# Patient Record
Sex: Male | Born: 1950 | Race: White | Hispanic: No | Marital: Married | Smoking: Never smoker
Health system: Southern US, Community
[De-identification: ages and names within clinical notes are randomized; demographics above are authoritative.]

## PROBLEM LIST (undated history)

## (undated) DIAGNOSIS — M545 Low back pain, unspecified: Secondary | ICD-10-CM

## (undated) DIAGNOSIS — I4892 Unspecified atrial flutter: Secondary | ICD-10-CM

## (undated) DIAGNOSIS — F1010 Alcohol abuse, uncomplicated: Secondary | ICD-10-CM

## (undated) DIAGNOSIS — I2510 Atherosclerotic heart disease of native coronary artery without angina pectoris: Secondary | ICD-10-CM

## (undated) DIAGNOSIS — R739 Hyperglycemia, unspecified: Secondary | ICD-10-CM

## (undated) DIAGNOSIS — I10 Essential (primary) hypertension: Secondary | ICD-10-CM

## (undated) DIAGNOSIS — M5430 Sciatica, unspecified side: Secondary | ICD-10-CM

## (undated) DIAGNOSIS — E785 Hyperlipidemia, unspecified: Secondary | ICD-10-CM

## (undated) DIAGNOSIS — G629 Polyneuropathy, unspecified: Secondary | ICD-10-CM

## (undated) DIAGNOSIS — I6529 Occlusion and stenosis of unspecified carotid artery: Secondary | ICD-10-CM

## (undated) DIAGNOSIS — IMO0002 Reserved for concepts with insufficient information to code with codable children: Secondary | ICD-10-CM

## (undated) DIAGNOSIS — Z951 Presence of aortocoronary bypass graft: Secondary | ICD-10-CM

## (undated) DIAGNOSIS — Z86010 Personal history of colonic polyps: Secondary | ICD-10-CM

## (undated) DIAGNOSIS — M109 Gout, unspecified: Secondary | ICD-10-CM

## (undated) DIAGNOSIS — I480 Paroxysmal atrial fibrillation: Secondary | ICD-10-CM

## (undated) HISTORY — DX: Low back pain: M54.5

## (undated) HISTORY — DX: Personal history of colonic polyps: Z86.010

## (undated) HISTORY — DX: Low back pain, unspecified: M54.50

## (undated) HISTORY — DX: Gout, unspecified: M10.9

## (undated) HISTORY — DX: Hyperlipidemia, unspecified: E78.5

## (undated) HISTORY — DX: Hyperglycemia, unspecified: R73.9

## (undated) HISTORY — PX: WRIST SURGERY: SHX841

## (undated) HISTORY — DX: Sciatica, unspecified side: M54.30

## (undated) HISTORY — DX: Reserved for concepts with insufficient information to code with codable children: IMO0002

## (undated) HISTORY — DX: Atherosclerotic heart disease of native coronary artery without angina pectoris: I25.10

## (undated) HISTORY — DX: Paroxysmal atrial fibrillation: I48.0

## (undated) HISTORY — PX: LAPAROSCOPIC INGUINAL HERNIA REPAIR: SUR788

## (undated) HISTORY — PX: INGUINAL HERNIA REPAIR: SUR1180

## (undated) HISTORY — DX: Polyneuropathy, unspecified: G62.9

## (undated) HISTORY — DX: Alcohol abuse, uncomplicated: F10.10

## (undated) HISTORY — PX: TONSILLECTOMY AND ADENOIDECTOMY: SUR1326

## (undated) HISTORY — DX: Occlusion and stenosis of unspecified carotid artery: I65.29

## (undated) HISTORY — DX: Unspecified atrial flutter: I48.92

---

## 1998-01-09 ENCOUNTER — Emergency Department (HOSPITAL_COMMUNITY): Admission: EM | Admit: 1998-01-09 | Discharge: 1998-01-09 | Payer: Self-pay | Admitting: Emergency Medicine

## 2000-06-30 ENCOUNTER — Ambulatory Visit (HOSPITAL_COMMUNITY): Admission: RE | Admit: 2000-06-30 | Discharge: 2000-06-30 | Payer: Self-pay | Admitting: General Surgery

## 2002-10-01 HISTORY — PX: COLONOSCOPY: SHX174

## 2004-03-09 ENCOUNTER — Ambulatory Visit (HOSPITAL_COMMUNITY): Admission: RE | Admit: 2004-03-09 | Discharge: 2004-03-09 | Payer: Self-pay | Admitting: Internal Medicine

## 2004-05-04 ENCOUNTER — Ambulatory Visit: Payer: Self-pay | Admitting: Cardiology

## 2004-05-22 ENCOUNTER — Ambulatory Visit: Payer: Self-pay | Admitting: Cardiology

## 2004-05-24 ENCOUNTER — Ambulatory Visit: Payer: Self-pay

## 2004-07-31 ENCOUNTER — Ambulatory Visit: Payer: Self-pay | Admitting: Internal Medicine

## 2004-08-30 ENCOUNTER — Ambulatory Visit: Payer: Self-pay | Admitting: Internal Medicine

## 2005-02-01 ENCOUNTER — Ambulatory Visit: Payer: Self-pay | Admitting: Internal Medicine

## 2005-03-13 ENCOUNTER — Ambulatory Visit: Payer: Self-pay | Admitting: Internal Medicine

## 2005-09-06 ENCOUNTER — Emergency Department (HOSPITAL_COMMUNITY): Admission: EM | Admit: 2005-09-06 | Discharge: 2005-09-06 | Payer: Self-pay | Admitting: Emergency Medicine

## 2005-09-20 ENCOUNTER — Ambulatory Visit: Payer: Self-pay | Admitting: Internal Medicine

## 2006-04-02 ENCOUNTER — Ambulatory Visit: Payer: Self-pay | Admitting: Internal Medicine

## 2006-06-16 ENCOUNTER — Ambulatory Visit: Payer: Self-pay | Admitting: Internal Medicine

## 2006-06-16 LAB — CONVERTED CEMR LAB
ALT: 77 units/L — ABNORMAL HIGH (ref 0–40)
AST: 85 units/L — ABNORMAL HIGH (ref 0–37)
Albumin: 4 g/dL (ref 3.5–5.2)
Alkaline Phosphatase: 115 units/L (ref 39–117)
BUN: 14 mg/dL (ref 6–23)
Bilirubin, Direct: 0.2 mg/dL (ref 0.0–0.3)
CO2: 33 meq/L — ABNORMAL HIGH (ref 19–32)
Calcium: 9.5 mg/dL (ref 8.4–10.5)
Chloride: 95 meq/L — ABNORMAL LOW (ref 96–112)
Chol/HDL Ratio, serum: 6
Cholesterol: 315 mg/dL (ref 0–200)
Creatinine, Ser: 1.1 mg/dL (ref 0.4–1.5)
GFR calc non Af Amer: 74 mL/min
Glomerular Filtration Rate, Af Am: 90 mL/min/{1.73_m2}
Glucose, Bld: 144 mg/dL — ABNORMAL HIGH (ref 70–99)
HDL: 52.2 mg/dL (ref >39.0)
LDL DIRECT: 191.4 mg/dL
Potassium: 4.9 meq/L (ref 3.5–5.1)
Sodium: 135 meq/L (ref 135–145)
Total Bilirubin: 0.8 mg/dL (ref 0.3–1.2)
Total Protein: 6.7 g/dL (ref 6.0–8.3)
Triglyceride fasting, serum: 437 mg/dL (ref 0–149)
VLDL: 87 mg/dL — ABNORMAL HIGH (ref 0–40)

## 2006-07-09 ENCOUNTER — Ambulatory Visit: Payer: Self-pay | Admitting: Internal Medicine

## 2006-09-28 ENCOUNTER — Emergency Department (HOSPITAL_COMMUNITY): Admission: EM | Admit: 2006-09-28 | Discharge: 2006-09-28 | Payer: Self-pay | Admitting: Emergency Medicine

## 2006-10-03 ENCOUNTER — Ambulatory Visit: Payer: Self-pay | Admitting: Internal Medicine

## 2006-12-23 ENCOUNTER — Telehealth: Payer: Self-pay | Admitting: Internal Medicine

## 2006-12-25 ENCOUNTER — Telehealth: Payer: Self-pay | Admitting: Internal Medicine

## 2006-12-25 ENCOUNTER — Telehealth (INDEPENDENT_AMBULATORY_CARE_PROVIDER_SITE_OTHER): Payer: Self-pay | Admitting: *Deleted

## 2006-12-25 ENCOUNTER — Encounter: Payer: Self-pay | Admitting: Internal Medicine

## 2007-01-20 ENCOUNTER — Encounter: Payer: Self-pay | Admitting: Internal Medicine

## 2007-01-21 ENCOUNTER — Telehealth (INDEPENDENT_AMBULATORY_CARE_PROVIDER_SITE_OTHER): Payer: Self-pay | Admitting: *Deleted

## 2007-03-04 ENCOUNTER — Telehealth: Payer: Self-pay | Admitting: Internal Medicine

## 2007-03-11 DIAGNOSIS — M545 Low back pain: Secondary | ICD-10-CM | POA: Insufficient documentation

## 2007-03-11 DIAGNOSIS — E785 Hyperlipidemia, unspecified: Secondary | ICD-10-CM

## 2007-03-11 DIAGNOSIS — I10 Essential (primary) hypertension: Secondary | ICD-10-CM | POA: Insufficient documentation

## 2007-05-21 ENCOUNTER — Ambulatory Visit: Payer: Self-pay | Admitting: Internal Medicine

## 2007-05-22 LAB — CONVERTED CEMR LAB
ALT: 75 units/L — ABNORMAL HIGH (ref 0–53)
AST: 111 units/L — ABNORMAL HIGH (ref 0–37)
Albumin: 4.5 g/dL (ref 3.5–5.2)
Alkaline Phosphatase: 147 units/L — ABNORMAL HIGH (ref 39–117)
BUN: 8 mg/dL (ref 6–23)
Bilirubin, Direct: 0.2 mg/dL (ref 0.0–0.3)
CO2: 30 meq/L (ref 19–32)
Calcium: 9.5 mg/dL (ref 8.4–10.5)
Chloride: 91 meq/L — ABNORMAL LOW (ref 96–112)
Cholesterol: 340 mg/dL (ref 0–200)
Creatinine, Ser: 0.9 mg/dL (ref 0.4–1.5)
Direct LDL: 130.2 mg/dL
GFR calc Af Amer: 113 mL/min
GFR calc non Af Amer: 93 mL/min
Glucose, Bld: 96 mg/dL (ref 70–99)
HDL: 74.1 mg/dL (ref >39.0)
Potassium: 3.9 meq/L (ref 3.5–5.1)
Sodium: 132 meq/L — ABNORMAL LOW (ref 135–145)
Total Bilirubin: 0.8 mg/dL (ref 0.3–1.2)
Total CHOL/HDL Ratio: 4.6
Total Protein: 7.4 g/dL (ref 6.0–8.3)
Triglycerides: 885 mg/dL (ref 0–149)
VLDL: 177 mg/dL — ABNORMAL HIGH (ref 0–40)

## 2007-06-06 ENCOUNTER — Ambulatory Visit: Payer: Self-pay | Admitting: Family Medicine

## 2007-06-16 ENCOUNTER — Telehealth: Payer: Self-pay | Admitting: Internal Medicine

## 2007-06-17 ENCOUNTER — Telehealth: Payer: Self-pay | Admitting: Family Medicine

## 2007-08-27 ENCOUNTER — Ambulatory Visit: Payer: Self-pay | Admitting: Cardiology

## 2007-09-10 ENCOUNTER — Encounter: Payer: Self-pay | Admitting: Internal Medicine

## 2007-09-10 ENCOUNTER — Ambulatory Visit: Payer: Self-pay

## 2007-09-17 ENCOUNTER — Ambulatory Visit: Payer: Self-pay | Admitting: Cardiology

## 2007-09-17 LAB — CONVERTED CEMR LAB
BUN: 9 mg/dL (ref 6–23)
Basophils Relative: 0.1 % (ref 0.0–1.0)
CO2: 31 meq/L (ref 19–32)
Calcium: 9 mg/dL (ref 8.4–10.5)
Chloride: 94 meq/L — ABNORMAL LOW (ref 96–112)
Creatinine, Ser: 0.8 mg/dL (ref 0.4–1.5)
GFR calc non Af Amer: 107 mL/min
HCT: 37 % — ABNORMAL LOW (ref 39.0–52.0)
INR: 1 (ref 0.8–1.0)
Monocytes Absolute: 0.4 10*3/uL (ref 0.1–1.0)
Monocytes Relative: 11.9 % (ref 3.0–12.0)
Potassium: 4.9 meq/L (ref 3.5–5.1)
Prothrombin Time: 11.9 s (ref 10.9–13.3)
Sodium: 134 meq/L — ABNORMAL LOW (ref 135–145)
WBC: 3 10*3/uL — ABNORMAL LOW (ref 4.5–10.5)

## 2007-09-18 ENCOUNTER — Ambulatory Visit: Payer: Self-pay | Admitting: Cardiology

## 2007-09-18 ENCOUNTER — Inpatient Hospital Stay (HOSPITAL_BASED_OUTPATIENT_CLINIC_OR_DEPARTMENT_OTHER): Admission: RE | Admit: 2007-09-18 | Discharge: 2007-09-18 | Payer: Self-pay | Admitting: Cardiology

## 2007-09-18 HISTORY — PX: CARDIAC CATHETERIZATION: SHX172

## 2007-09-21 ENCOUNTER — Ambulatory Visit: Payer: Self-pay | Admitting: Thoracic Surgery (Cardiothoracic Vascular Surgery)

## 2007-09-21 ENCOUNTER — Encounter: Payer: Self-pay | Admitting: Internal Medicine

## 2007-09-23 ENCOUNTER — Telehealth: Payer: Self-pay | Admitting: Internal Medicine

## 2007-09-29 ENCOUNTER — Encounter: Payer: Self-pay | Admitting: Thoracic Surgery (Cardiothoracic Vascular Surgery)

## 2007-09-29 ENCOUNTER — Ambulatory Visit: Payer: Self-pay | Admitting: Vascular Surgery

## 2007-09-29 ENCOUNTER — Ambulatory Visit
Admission: RE | Admit: 2007-09-29 | Discharge: 2007-09-29 | Payer: Self-pay | Admitting: Thoracic Surgery (Cardiothoracic Vascular Surgery)

## 2007-10-02 ENCOUNTER — Encounter: Payer: Self-pay | Admitting: Internal Medicine

## 2007-10-05 ENCOUNTER — Ambulatory Visit: Payer: Self-pay | Admitting: Thoracic Surgery (Cardiothoracic Vascular Surgery)

## 2007-10-09 ENCOUNTER — Ambulatory Visit: Payer: Self-pay | Admitting: Internal Medicine

## 2007-10-09 ENCOUNTER — Inpatient Hospital Stay (HOSPITAL_COMMUNITY)
Admission: RE | Admit: 2007-10-09 | Discharge: 2007-10-28 | Payer: Self-pay | Admitting: Thoracic Surgery (Cardiothoracic Vascular Surgery)

## 2007-10-09 ENCOUNTER — Ambulatory Visit: Payer: Self-pay | Admitting: Thoracic Surgery (Cardiothoracic Vascular Surgery)

## 2007-10-09 HISTORY — PX: CORONARY ARTERY BYPASS GRAFT: SHX141

## 2007-10-23 ENCOUNTER — Ambulatory Visit: Payer: Self-pay | Admitting: Surgery

## 2007-10-23 ENCOUNTER — Encounter: Payer: Self-pay | Admitting: Thoracic Surgery (Cardiothoracic Vascular Surgery)

## 2007-10-27 ENCOUNTER — Ambulatory Visit: Payer: Self-pay | Admitting: Psychiatry

## 2007-11-16 ENCOUNTER — Encounter
Admission: RE | Admit: 2007-11-16 | Discharge: 2007-11-16 | Payer: Self-pay | Admitting: Thoracic Surgery (Cardiothoracic Vascular Surgery)

## 2007-11-16 ENCOUNTER — Encounter: Payer: Self-pay | Admitting: Internal Medicine

## 2007-11-16 ENCOUNTER — Ambulatory Visit: Payer: Self-pay | Admitting: Thoracic Surgery (Cardiothoracic Vascular Surgery)

## 2008-01-27 ENCOUNTER — Ambulatory Visit: Payer: Self-pay | Admitting: Cardiology

## 2008-06-07 ENCOUNTER — Ambulatory Visit: Payer: Self-pay | Admitting: Family Medicine

## 2008-06-07 DIAGNOSIS — I872 Venous insufficiency (chronic) (peripheral): Secondary | ICD-10-CM | POA: Insufficient documentation

## 2009-02-11 ENCOUNTER — Ambulatory Visit: Payer: Self-pay | Admitting: Family Medicine

## 2009-02-11 DIAGNOSIS — S91309A Unspecified open wound, unspecified foot, initial encounter: Secondary | ICD-10-CM | POA: Insufficient documentation

## 2009-04-04 ENCOUNTER — Encounter (INDEPENDENT_AMBULATORY_CARE_PROVIDER_SITE_OTHER): Payer: Self-pay | Admitting: *Deleted

## 2009-10-29 ENCOUNTER — Emergency Department (HOSPITAL_COMMUNITY): Admission: EM | Admit: 2009-10-29 | Discharge: 2009-10-29 | Payer: Self-pay | Admitting: Emergency Medicine

## 2009-10-29 ENCOUNTER — Inpatient Hospital Stay (HOSPITAL_COMMUNITY): Admission: EM | Admit: 2009-10-29 | Discharge: 2009-11-01 | Payer: Self-pay | Admitting: Emergency Medicine

## 2009-10-30 ENCOUNTER — Ambulatory Visit: Payer: Self-pay | Admitting: Vascular Surgery

## 2009-10-30 ENCOUNTER — Encounter (INDEPENDENT_AMBULATORY_CARE_PROVIDER_SITE_OTHER): Payer: Self-pay | Admitting: Internal Medicine

## 2009-11-10 ENCOUNTER — Telehealth: Payer: Self-pay | Admitting: Internal Medicine

## 2009-11-21 ENCOUNTER — Ambulatory Visit: Payer: Self-pay | Admitting: Internal Medicine

## 2009-11-21 DIAGNOSIS — F1010 Alcohol abuse, uncomplicated: Secondary | ICD-10-CM | POA: Insufficient documentation

## 2009-11-21 LAB — CONVERTED CEMR LAB: Alcohol, Ethyl (B): <10 mg/dL (ref 0–10)

## 2009-11-24 LAB — CONVERTED CEMR LAB
Albumin: 4.1 g/dL (ref 3.5–5.2)
Basophils Absolute: 0 10*3/uL (ref 0.0–0.1)
Basophils Relative: 0.4 % (ref 0.0–3.0)
CO2: 29 meq/L (ref 19–32)
Chloride: 96 meq/L (ref 96–112)
Cholesterol: 326 mg/dL — ABNORMAL HIGH (ref 0–200)
Hemoglobin: 14.2 g/dL (ref 13.0–17.0)
Lymphocytes Relative: 17.8 % (ref 12.0–46.0)
Lymphs Abs: 1.4 10*3/uL (ref 0.7–4.0)
MCHC: 33.9 g/dL (ref 30.0–36.0)
MCV: 97.7 fL (ref 78.0–100.0)
Monocytes Absolute: 0.8 10*3/uL (ref 0.1–1.0)
Monocytes Relative: 9.4 % (ref 3.0–12.0)
Neutro Abs: 5.5 10*3/uL (ref 1.4–7.7)
PSA: 0.37 ng/mL (ref 0.10–4.00)
Potassium: 4.6 meq/L (ref 3.5–5.1)
Sodium: 137 meq/L (ref 135–145)
TSH: 1.23 microintl units/mL (ref 0.35–5.50)
Total CHOL/HDL Ratio: 5
Triglycerides: 341 mg/dL — ABNORMAL HIGH (ref 0.0–149.0)
VLDL: 68.2 mg/dL — ABNORMAL HIGH (ref 0.0–40.0)

## 2010-01-13 ENCOUNTER — Encounter: Payer: Self-pay | Admitting: Internal Medicine

## 2010-05-24 ENCOUNTER — Ambulatory Visit: Payer: Self-pay | Admitting: Internal Medicine

## 2010-07-03 NOTE — Assessment & Plan Note (Signed)
Summary: FU foot injury hospitalization/et/pt rescd//ccm   Vital Signs:  Patient profile:   60 year old male Weight:      211 pounds Temp:     97.6 degrees F oral Pulse rate:   66 / minute Pulse rhythm:   regular Resp:     12 per minute BP sitting:   134 / 80  (left arm) Cuff size:   regular  Vitals Entered By: Gladis Riffle, RN (November 21, 2009 8:36 AM) CC: med review and refills Is Patient Diabetic? No   Primary Care Provider:  Jerzi Tigert  CC:  med review and refills.  History of Present Illness: pt comes in for post hospital f/u related to cellulitis hospital records reviewed also reviewed records from previous hospitaliztaion (CABG)---note both hospitalizations complicated by alcohol use  in addition pt has been noncompliant with f/u for htn, lipids.  Cellulitis: started with injury---evolved to cellulitis  alcohol---"drinking a lot"    Preventive Screening-Counseling & Management  Alcohol-Tobacco     Smoking Status: never  Current Medications (verified): 1)  Cyclobenzaprine Hcl 10 Mg Tabs (Cyclobenzaprine Hcl) .... Take 1 Tablet By Mouth Two Times A Day 2)  Effexor Xr 150 Mg Cp24 (Venlafaxine Hcl) .... Take 1 Capsule By Mouth Two Times A Day 3)  Imitrex 20 Mg/act Soln (Sumatriptan) .... Inhale 1 Puff As Directed Once A Day As Needed 4)  Lisinopril-Hydrochlorothiazide 20-12.5 Mg Tabs (Lisinopril-Hydrochlorothiazide) .... 2 By Mouth Once Daily 5)  Simvastatin 80 Mg Tabs (Simvastatin) .... Take 1 Tablet By Mouth At Bedtime 6)  Gabapentin 300 Mg  Caps (Gabapentin) .... Take 1 Capsule By Mouth Two Times A Day  Allergies: 1)  ! Compazine  Family History: Family History of CAD Male 1st degree relative <60 no fhx dm  Social History: Married Never Smoked Alcohol use-yes (alcohol abuse)  Physical Exam  General:  Well-developed,well-nourished,in no acute distress; alert,appropriate and cooperative throughout examination Head:  normocephalic and atraumatic.   Eyes:   pupils equal and pupils round.   Ears:  R ear normal and L ear normal.   Neck:  No deformities, masses, or tenderness noted. Chest Wall:  No deformities, masses, tenderness or gynecomastia noted. Lungs:  Normal respiratory effort, chest expands symmetrically. Lungs are clear to auscultation, no crackles or wheezes. Heart:  normal rate and regular rhythm.   Abdomen:  soft and non-tender.   Msk:  No deformity or scoliosis noted of thoracic or lumbar spine.   Neurologic:  cranial nerves II-XII intact and gait normal.   Skin:  left leg without erythema there is a 1.5 cm scab on dorsum of left great toe---no erythema   Impression & Recommendations:  Problem # 1:  WOUND, FOOT (ICD-892.0)  cellulitis resolved he is at high risk for recurrence given his lifestyle and medical problems  Orders: TLB-CBC Platelet - w/Differential (85025-CBCD)  Problem # 2:  ALCOHOL ABUSE (ICD-305.00)  this is a significant problem likely contributing to cellulits and DM advised AA---he refuses any intervention at this time  Orders: T-Alcohol (57846-96295) TLB-Hepatic/Liver Function Pnl (80076-HEPATIC)  Problem # 3:  Preventive Health Care (ICD-V70.0) advised alcohol abstinence  Problem # 4:  HYPERTENSION (ICD-401.9)  reasonable control His updated medication list for this problem includes:    Lisinopril-hydrochlorothiazide 20-12.5 Mg Tabs (Lisinopril-hydrochlorothiazide) .Marland Kitchen... 2 by mouth once daily  BP today: 134/80 Prior BP: 130/84 (02/11/2009)  Labs Reviewed: K+: 4.9 (09/17/2007) Creat: : 0.8 (09/17/2007)   Chol: 340 (05/21/2007)   HDL: 74.1 (05/21/2007)   LDL: DEL (05/21/2007)  TG: 885 (05/21/2007)  Orders: TLB-BMP (Basic Metabolic Panel-BMET) (80048-METABOL) Venipuncture (04540)  Problem # 5:  HYPERLIPIDEMIA (ICD-272.4)  note high dose statin  will check labs and then consider changing His updated medication list for this problem includes:    Simvastatin 80 Mg Tabs (Simvastatin)  .Marland Kitchen... Take 1 tablet by mouth at bedtime  Labs Reviewed: SGOT: 111 (05/21/2007)   SGPT: 75 (05/21/2007)   HDL:74.1 (05/21/2007), 52.2 (06/16/2006)  LDL:DEL (05/21/2007), DEL (06/16/2006)  Chol:340 (05/21/2007), 315 (06/16/2006)  Trig:885 (05/21/2007), 437 (06/16/2006)  Orders: TLB-Lipid Panel (80061-LIPID) TLB-TSH (Thyroid Stimulating Hormone) (84443-TSH)  Complete Medication List: 1)  Cyclobenzaprine Hcl 10 Mg Tabs (Cyclobenzaprine hcl) .... Take 1 tablet by mouth two times a day 2)  Effexor Xr 150 Mg Cp24 (Venlafaxine hcl) .... Take 1 capsule by mouth two times a day 3)  Imitrex 20 Mg/act Soln (Sumatriptan) .... Inhale 1 puff as directed once a day as needed 4)  Lisinopril-hydrochlorothiazide 20-12.5 Mg Tabs (Lisinopril-hydrochlorothiazide) .... 2 by mouth once daily 5)  Simvastatin 80 Mg Tabs (Simvastatin) .... Take 1 tablet by mouth at bedtime 6)  Gabapentin 300 Mg Caps (Gabapentin) .... Take 1 capsule by mouth two times a day 7)  Vicodin Hp 10-660 Mg Tabs (Hydrocodone-acetaminophen) .... Every 608 hours as needed  Patient Instructions: 1)  you will be referred to diabetic center for nutrition education 2)  You should stop all alcohol use and attend Alcoholics Anonymous meetings 3)  It is important that you exercise regularly at least 20 minutes 5 times a week. If you develop chest pain, have severe difficulty breathing, or feel very tired , stop exercising immediately and seek medical attention. 4)  You need to lose weight. Consider a lower calorie diet and regular exercise.  5)  Schedule a colonoscopy/sigmoidoscopy to help detect colon cancer.  Appended Document: Orders Update     Clinical Lists Changes  Orders: Added new Test order of TLB-PSA (Prostate Specific Antigen) (84153-PSA) - Signed Added new Test order of TLB-Testosterone, Total (84403-TESTO) - Signed

## 2010-07-03 NOTE — Progress Notes (Signed)
Summary: foot injury with admission to hospital  Phone Note Call from Patient   Caller: Patient Call For: Birdie Sons MD Action Taken: Patient advised to call 911 Summary of Call: I had a 20 minute conversation with this pt.  Reviewed his entire discharge summary form 06/01/101 (please review).  He is concerned that due to his neuropathy, he cannot tell what type of pain he is having in his foot.......Marland Kitchenhe was treated with IV antibiotics and sent home on oral antibioitics, but was anxious to go home sooner than recommended by his MD.  He has been compliant with his antiboitcs.  While hospitalized, he was found to have DM with a HGB A1 C of 7.  He was started on Actos and not Metformin due to his alcohol use.  He was actually having alcohol brought into his hospital room and he was consuming it until the nurses found him intoxicated, and searched the room.   In any event, I went over his discharge orders and when to see Dr. Cato Mulligan, and his orthopedist.  Encouraged him to follow through with [facility] health to have a nurse come to evaluate his foot.  By the end of the conversation, it was apparent, he is very concerned about a latent infection, and the possibility of an amputation, so my advice was to go to the ER and have his foot evaluated for any signs of problems.  He agrees.  He will make his appt to see Dr. Cato Mulligan to address the DM issue.  Will send this phone note to Dr. Lovell Sheehan and Dr. Cato Mulligan for any additional direction. Initial call taken by: Lynann Beaver CMA,  November 10, 2009 3:54 PM  Follow-up for Phone Call        noted Follow-up by: Stacie Glaze MD,  November 10, 2009 5:22 PM

## 2010-07-03 NOTE — Letter (Signed)
Summary: [facility]  [facility]   Imported By: Maryln Gottron 01/17/2010 15:59:49  _____________________________________________________________________  External Attachment:    Type:   Image     Comment:   External Document

## 2010-07-20 ENCOUNTER — Other Ambulatory Visit: Payer: Self-pay | Admitting: Internal Medicine

## 2010-07-20 NOTE — Telephone Encounter (Signed)
Info only...Marland KitchenMarland KitchenPt needs refills on regular monthly meds.... Will be receiving fax from [facility], [place] ref same.

## 2010-08-20 LAB — CBC
HCT: 32.9 % — ABNORMAL LOW (ref 39.0–52.0)
HCT: 35.7 % — ABNORMAL LOW (ref 39.0–52.0)
Hemoglobin: 12.3 g/dL — ABNORMAL LOW (ref 13.0–17.0)
Hemoglobin: 12.5 g/dL — ABNORMAL LOW (ref 13.0–17.0)
MCHC: 34.4 g/dL (ref 30.0–36.0)
MCV: 96.4 fL (ref 78.0–100.0)
Platelets: 115 10*3/uL — ABNORMAL LOW (ref 150–400)
Platelets: 123 10*3/uL — ABNORMAL LOW (ref 150–400)
RBC: 3.39 MIL/uL — ABNORMAL LOW (ref 4.22–5.81)
RBC: 3.72 MIL/uL — ABNORMAL LOW (ref 4.22–5.81)
RBC: 3.77 MIL/uL — ABNORMAL LOW (ref 4.22–5.81)
RBC: 4.45 MIL/uL (ref 4.22–5.81)
RDW: 13.1 % (ref 11.5–15.5)
WBC: 4.7 10*3/uL (ref 4.0–10.5)
WBC: 4.5 10*3/uL (ref 4.0–10.5)
WBC: 7.6 10*3/uL (ref 4.0–10.5)

## 2010-08-20 LAB — URINE DRUGS OF ABUSE SCREEN W ALC, ROUTINE (REF LAB)
Amphetamine Screen, Ur: NEGATIVE
Barbiturate Quant, Ur: NEGATIVE
Benzodiazepines.: NEGATIVE
Benzodiazepines.: NEGATIVE
Cocaine Metabolites: NEGATIVE
Creatinine,U: 74 mg/dL
Ethyl Alcohol: <10 mg/dL (ref <10)
Marijuana Metabolite: NEGATIVE
Methadone: NEGATIVE
Methadone: NEGATIVE
Propoxyphene: POSITIVE — AB
Propoxyphene: POSITIVE — AB

## 2010-08-20 LAB — PROTIME-INR
INR: 1.13 (ref 0.00–1.49)
Prothrombin Time: 14.4 seconds (ref 11.6–15.2)

## 2010-08-20 LAB — COMPREHENSIVE METABOLIC PANEL
ALT: 27 U/L (ref 0–53)
ALT: 33 U/L (ref 0–53)
AST: 33 U/L (ref 0–37)
AST: 50 U/L — ABNORMAL HIGH (ref 0–37)
Alkaline Phosphatase: 82 U/L (ref 39–117)
Alkaline Phosphatase: 83 U/L (ref 39–117)
BUN: 6 mg/dL (ref 6–23)
CO2: 28 mEq/L (ref 19–32)
CO2: 31 mEq/L (ref 19–32)
Calcium: 8.5 mg/dL (ref 8.4–10.5)
Chloride: 97 mEq/L (ref 96–112)
Creatinine, Ser: 0.81 mg/dL (ref 0.4–1.5)
GFR calc Af Amer: >60 mL/min (ref >60)
GFR calc non Af Amer: >60 mL/min (ref >60)
GFR calc non Af Amer: >60 mL/min (ref >60)
GFR calc non Af Amer: >60 mL/min (ref >60)
Glucose, Bld: 165 mg/dL — ABNORMAL HIGH (ref 70–99)
Glucose, Bld: 205 mg/dL — ABNORMAL HIGH (ref 70–99)
Potassium: 4.2 mEq/L (ref 3.5–5.1)
Sodium: 134 mEq/L — ABNORMAL LOW (ref 135–145)
Sodium: 135 mEq/L (ref 135–145)
Total Bilirubin: 0.7 mg/dL (ref 0.3–1.2)
Total Protein: 5.6 g/dL — ABNORMAL LOW (ref 6.0–8.3)

## 2010-08-20 LAB — CULTURE, BLOOD (ROUTINE X 2)

## 2010-08-20 LAB — BASIC METABOLIC PANEL
BUN: 7 mg/dL (ref 6–23)
GFR calc Af Amer: >60 mL/min (ref >60)
GFR calc non Af Amer: >60 mL/min (ref >60)
Potassium: 3.7 mEq/L (ref 3.5–5.1)

## 2010-08-20 LAB — DIFFERENTIAL
Basophils Absolute: 0 10*3/uL (ref 0.0–0.1)
Basophils Relative: 0 % (ref 0–1)
Eosinophils Absolute: 0.2 10*3/uL (ref 0.0–0.7)
Eosinophils Absolute: 0.3 10*3/uL (ref 0.0–0.7)
Lymphocytes Relative: 19 % (ref 12–46)
Lymphs Abs: 1 10*3/uL (ref 0.7–4.0)
Lymphs Abs: 1.3 10*3/uL (ref 0.7–4.0)
Monocytes Relative: 13 % — ABNORMAL HIGH (ref 3–12)
Monocytes Relative: 15 % — ABNORMAL HIGH (ref 3–12)
Neutro Abs: 5 10*3/uL (ref 1.7–7.7)
Neutrophils Relative %: 61 % (ref 43–77)
Neutrophils Relative %: 61 % (ref 43–77)
Neutrophils Relative %: 65 % (ref 43–77)

## 2010-08-20 LAB — OPIATE, QUANTITATIVE, URINE
Codeine Urine: NEGATIVE NG/ML
Hydrocodone: 3009 NG/ML — ABNORMAL HIGH
Hydromorphone GC/MS Conf: 306 NG/ML — ABNORMAL HIGH
Morphine, Confirm: NEGATIVE NG/ML
Oxycodone, ur: 470 NG/ML — ABNORMAL HIGH

## 2010-08-20 LAB — GLUCOSE, CAPILLARY
Glucose-Capillary: 183 mg/dL — ABNORMAL HIGH (ref 70–99)
Glucose-Capillary: 184 mg/dL — ABNORMAL HIGH (ref 70–99)
Glucose-Capillary: 213 mg/dL — ABNORMAL HIGH (ref 70–99)
Glucose-Capillary: 235 mg/dL — ABNORMAL HIGH (ref 70–99)

## 2010-08-20 LAB — SEDIMENTATION RATE: Sed Rate: 17 mm/hr — ABNORMAL HIGH (ref 0–16)

## 2010-09-19 ENCOUNTER — Emergency Department (HOSPITAL_COMMUNITY): Payer: Managed Care, Other (non HMO)

## 2010-09-19 ENCOUNTER — Encounter (HOSPITAL_COMMUNITY): Payer: Self-pay | Admitting: Radiology

## 2010-09-19 ENCOUNTER — Inpatient Hospital Stay (HOSPITAL_COMMUNITY)
Admission: EM | Admit: 2010-09-19 | Discharge: 2010-09-24 | DRG: 193 | Disposition: A | Discharge status: Home / Self Care | Payer: Managed Care, Other (non HMO) | Attending: Internal Medicine | Admitting: Internal Medicine

## 2010-09-19 DIAGNOSIS — M5030 Other cervical disc degeneration, unspecified cervical region: Secondary | ICD-10-CM | POA: Diagnosis present

## 2010-09-19 DIAGNOSIS — E860 Dehydration: Secondary | ICD-10-CM | POA: Diagnosis present

## 2010-09-19 DIAGNOSIS — M51379 Other intervertebral disc degeneration, lumbosacral region without mention of lumbar back pain or lower extremity pain: Secondary | ICD-10-CM | POA: Diagnosis present

## 2010-09-19 DIAGNOSIS — F10239 Alcohol dependence with withdrawal, unspecified: Secondary | ICD-10-CM | POA: Diagnosis not present

## 2010-09-19 DIAGNOSIS — M5137 Other intervertebral disc degeneration, lumbosacral region: Secondary | ICD-10-CM | POA: Diagnosis present

## 2010-09-19 DIAGNOSIS — J189 Pneumonia, unspecified organism: Principal | ICD-10-CM | POA: Diagnosis present

## 2010-09-19 DIAGNOSIS — E785 Hyperlipidemia, unspecified: Secondary | ICD-10-CM | POA: Diagnosis present

## 2010-09-19 DIAGNOSIS — Z86711 Personal history of pulmonary embolism: Secondary | ICD-10-CM

## 2010-09-19 DIAGNOSIS — F10939 Alcohol use, unspecified with withdrawal, unspecified: Secondary | ICD-10-CM | POA: Diagnosis not present

## 2010-09-19 DIAGNOSIS — F1020 Alcohol dependence, uncomplicated: Secondary | ICD-10-CM | POA: Diagnosis present

## 2010-09-19 DIAGNOSIS — J9600 Acute respiratory failure, unspecified whether with hypoxia or hypercapnia: Secondary | ICD-10-CM | POA: Diagnosis present

## 2010-09-19 DIAGNOSIS — I10 Essential (primary) hypertension: Secondary | ICD-10-CM | POA: Diagnosis present

## 2010-09-19 DIAGNOSIS — F329 Major depressive disorder, single episode, unspecified: Secondary | ICD-10-CM | POA: Diagnosis present

## 2010-09-19 DIAGNOSIS — I4891 Unspecified atrial fibrillation: Secondary | ICD-10-CM | POA: Diagnosis present

## 2010-09-19 DIAGNOSIS — F3289 Other specified depressive episodes: Secondary | ICD-10-CM | POA: Diagnosis present

## 2010-09-19 DIAGNOSIS — M109 Gout, unspecified: Secondary | ICD-10-CM | POA: Diagnosis present

## 2010-09-19 DIAGNOSIS — Z7982 Long term (current) use of aspirin: Secondary | ICD-10-CM

## 2010-09-19 DIAGNOSIS — E119 Type 2 diabetes mellitus without complications: Secondary | ICD-10-CM | POA: Diagnosis present

## 2010-09-19 DIAGNOSIS — I2510 Atherosclerotic heart disease of native coronary artery without angina pectoris: Secondary | ICD-10-CM | POA: Diagnosis present

## 2010-09-19 DIAGNOSIS — G8929 Other chronic pain: Secondary | ICD-10-CM | POA: Diagnosis present

## 2010-09-19 DIAGNOSIS — I4892 Unspecified atrial flutter: Secondary | ICD-10-CM | POA: Diagnosis present

## 2010-09-19 DIAGNOSIS — Z951 Presence of aortocoronary bypass graft: Secondary | ICD-10-CM

## 2010-09-19 HISTORY — DX: Essential (primary) hypertension: I10

## 2010-09-19 LAB — DIFFERENTIAL
Basophils Absolute: 0 10*3/uL (ref 0.0–0.1)
Basophils Relative: 1 % (ref 0–1)
Eosinophils Relative: 1 % (ref 0–5)
Monocytes Absolute: 1.1 10*3/uL — ABNORMAL HIGH (ref 0.1–1.0)

## 2010-09-19 LAB — BASIC METABOLIC PANEL
Calcium: 8.9 mg/dL (ref 8.4–10.5)
GFR calc Af Amer: >60 mL/min (ref >60)
GFR calc non Af Amer: >60 mL/min (ref >60)
Glucose, Bld: 158 mg/dL — ABNORMAL HIGH (ref 70–99)
Sodium: 130 mEq/L — ABNORMAL LOW (ref 135–145)

## 2010-09-19 LAB — URINALYSIS, ROUTINE W REFLEX MICROSCOPIC
Glucose, UA: NEGATIVE mg/dL
Ketones, ur: NEGATIVE mg/dL
Protein, ur: NEGATIVE mg/dL

## 2010-09-19 LAB — GLUCOSE, CAPILLARY

## 2010-09-19 LAB — POCT CARDIAC MARKERS
CKMB, poc: 1.2 ng/mL (ref 1.0–8.0)
Myoglobin, poc: 90.8 ng/mL (ref 12–200)

## 2010-09-19 LAB — CBC
MCHC: 33.3 g/dL (ref 30.0–36.0)
RDW: 13.3 % (ref 11.5–15.5)

## 2010-09-19 LAB — LACTIC ACID, PLASMA: Lactic Acid, Venous: 1.9 mmol/L (ref 0.5–2.2)

## 2010-09-19 LAB — MRSA PCR SCREENING: MRSA by PCR: NEGATIVE

## 2010-09-19 MED ORDER — IOHEXOL 300 MG/ML  SOLN: 100.0000 mL | Freq: Once | INTRAMUSCULAR | Status: AC | PRN

## 2010-09-20 DIAGNOSIS — R0602 Shortness of breath: Secondary | ICD-10-CM

## 2010-09-20 LAB — URINE CULTURE

## 2010-09-20 LAB — GLUCOSE, CAPILLARY: Glucose-Capillary: 156 mg/dL — ABNORMAL HIGH (ref 70–99)

## 2010-09-20 LAB — COMPREHENSIVE METABOLIC PANEL
Alkaline Phosphatase: 78 U/L (ref 39–117)
BUN: 6 mg/dL (ref 6–23)
CO2: 28 mEq/L (ref 19–32)
Chloride: 96 mEq/L (ref 96–112)
GFR calc non Af Amer: >60 mL/min (ref >60)
Glucose, Bld: 139 mg/dL — ABNORMAL HIGH (ref 70–99)
Potassium: 4.2 mEq/L (ref 3.5–5.1)
Total Bilirubin: 0.6 mg/dL (ref 0.3–1.2)

## 2010-09-20 LAB — CBC
Hemoglobin: 11.7 g/dL — ABNORMAL LOW (ref 13.0–17.0)
MCH: 31.4 pg (ref 26.0–34.0)
RBC: 3.73 MIL/uL — ABNORMAL LOW (ref 4.22–5.81)
WBC: 5.1 10*3/uL (ref 4.0–10.5)

## 2010-09-20 LAB — HEPARIN LEVEL (UNFRACTIONATED): Heparin Unfractionated: 0.3 IU/mL (ref 0.30–0.70)

## 2010-09-21 LAB — PROTIME-INR: Prothrombin Time: 14.1 seconds (ref 11.6–15.2)

## 2010-09-21 LAB — CBC
MCH: 32.3 pg (ref 26.0–34.0)
Platelets: 160 10*3/uL (ref 150–400)
RBC: 3.62 MIL/uL — ABNORMAL LOW (ref 4.22–5.81)

## 2010-09-21 LAB — GLUCOSE, CAPILLARY: Glucose-Capillary: 113 mg/dL — ABNORMAL HIGH (ref 70–99)

## 2010-09-22 LAB — GLUCOSE, CAPILLARY
Glucose-Capillary: 94 mg/dL (ref 70–99)
Glucose-Capillary: 121 mg/dL — ABNORMAL HIGH (ref 70–99)

## 2010-09-22 LAB — CBC
HCT: 34.2 % — ABNORMAL LOW (ref 39.0–52.0)
Hemoglobin: 11.7 g/dL — ABNORMAL LOW (ref 13.0–17.0)
MCV: 93.7 fL (ref 78.0–100.0)
RBC: 3.65 MIL/uL — ABNORMAL LOW (ref 4.22–5.81)
WBC: 3.7 10*3/uL — ABNORMAL LOW (ref 4.0–10.5)

## 2010-09-23 LAB — CBC
Platelets: 188 10*3/uL (ref 150–400)
RDW: 12.9 % (ref 11.5–15.5)
WBC: 3 10*3/uL — ABNORMAL LOW (ref 4.0–10.5)

## 2010-09-23 LAB — GLUCOSE, CAPILLARY: Glucose-Capillary: 96 mg/dL (ref 70–99)

## 2010-09-24 LAB — CBC
HCT: 33.2 % — ABNORMAL LOW (ref 39.0–52.0)
Hemoglobin: 11 g/dL — ABNORMAL LOW (ref 13.0–17.0)
MCH: 31.4 pg (ref 26.0–34.0)
MCV: 94.9 fL (ref 78.0–100.0)
RBC: 3.5 MIL/uL — ABNORMAL LOW (ref 4.22–5.81)
WBC: 3.9 10*3/uL — ABNORMAL LOW (ref 4.0–10.5)

## 2010-09-24 LAB — GLUCOSE, CAPILLARY: Glucose-Capillary: 96 mg/dL (ref 70–99)

## 2010-09-25 LAB — CULTURE, BLOOD (ROUTINE X 2)
Culture  Setup Time: 201204180923
Culture: NO GROWTH

## 2010-10-04 ENCOUNTER — Telehealth: Payer: Self-pay | Admitting: Cardiology

## 2010-10-04 NOTE — Consult Note (Signed)
NAMEKILEY, TORRENCE             ACCOUNT NO.:  [id number]  MEDICAL RECORD NO.:  [id number]           PATIENT TYPE:  E  LOCATION:  [facility]                         FACILITY:  [facility]  PHYSICIAN:  Tajay Muzzy C. Saquan Furtick, MD, FACCDATE OF BIRTH:  1950-10-06  DATE OF CONSULTATION:  09/19/2010 DATE OF DISCHARGE:                                CONSULTATION   REFERRING PHYSICIAN AND REASON FOR CONSULTATION:  I was asked by Dr. Butler Denmark at the [facility] to evaluate and manage Adair Patter with recurrent atrial fibrillation and flutter.  HISTORY OF PRESENT ILLNESS:  Alfonsa Vaile is a 60 year old of married white male who presents with a 2-day history of fever and shortness of breath.  Evaluation in the emergency room showed a mildly elevated BNP of 378, chest x-ray showed no acute abnormality other than previous bypass surgery, and chest CT showed no pulmonary embolus.  He being admitted with pneumonia.  His cardiac history is significant for previous coronary artery disease. In May 2009, he underwent coronary bypass grafting by [name] with left internal mammary graft to the second marginal branch, left radial artery to the PDA, and saphenous vein graft to diagonal.  His EF was 60%.  It appears he was last seen by Dr. Juanda Chance who has retired.  He has been stable with no angina.  He denies any palpitations, syncope, or presyncope.  He does complain of lower extremity edema which has been chronic.  He is on Actos.  He is a diabetic, of course.  In the emergency room, he is found to be in atrial fibrillation with a ventricular rate of 153 beats per minute.  Looking back to his EKG, he has had atrial flutter in the past as well.  He had a history of postoperative atrial fibrillation according to the records.  He had been given intravenous diltiazem and he is now down in the 100 range.  His blood pressure stable at 136/80.  He is in some respiratory discomfort with a respiratory  rate of 24 and a lot of rhonchi and wheezing.  He does not smoke.  He does use excess alcohol.  PAST MEDICAL HISTORY:  In addition to the above, he had a left inguinal hernia repair.  He has had a T and A.  He has a long history of cervical spine disease, not necessitating surgery.  His other medical problems include hypertension, hyperlipidemia, depression, neuropathy, gout, degenerative disk disease, sciatica, and type 2 diabetes.  He has had a previous history of cellulitis after stepping on glass in the left leg.  This was in 2011.  ALLERGIES:  He is allergic to COMPAZINE.  MEDICATIONS:  Currently being reconcile.  His last discharge summary demonstrated the following meds which are: 1. Actos 15 mg 1 tablet daily. 2. Aspirin 81 mg a day. 3. Folic acid 1 mg a day. 4. Multivitamin 1 a day time. 5. Thiamine 100 mg a day. 6. Effexor XR 150 mg b.i.d. 7. Lisinopril 20 mg twice a day. 8. Metoprolol 25 mg a day. 9. Prilosec over-the-counter 1 capsule daily p.r.n.  FAMILY HISTORY:  Heart disease in his father who  underwent bypass surgery in his 75s.  There is also a history of stroke.  No other significant illnesses.  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  He has been a part-time tennis pro in the past.  He is a nonsmoker.  He does drink approximately 4 beers a day.  REVIEW OF SYSTEMS:  All negative other than HPI.  He denies any melena, hematochezia, or hematemesis.  PHYSICAL EXAMINATION:  GENERAL:  He is older looking than stated age white male in mild respiratory discomfort.  His respirations are 24.  He had a lot of rhonchi.  He has a ruddy complexion. VITAL SIGNS:  Reviewed.  His current heart rate is about 100 in atrial fib and his blood pressure is 136/90. HEENT:  Ruddy complexion.  Nonicteric sclerae.  Injected conjunctivae. Facial symmetry is normal.  Dentition is satisfactory. NECK:  Supple.  Carotid upstrokes are equal  bilaterally without obvious bruit.  There are a lot of noisy breath sounds.  Thyroid is not enlarged.  No obvious cervical adenopathy. LUNGS:  Inspiratory and expiratory rhonchi bilaterally. CHEST:  Previous median sternotomy is stable. HEART:  PMI nondisplaced.  Irregular rate and rhythm which is about 100 beats per minute.  No obvious murmur. ABDOMEN:  Soft, good bowel sounds.  No midline bruit.  No obvious hepatomegaly. EXTREMITIES:  No cyanosis, clubbing but there is edema of about 1-2+. His peripheral pulses are diminished.  He has some chronic edematous and rubor changes. NEUROLOGIC:  Grossly intact.  He is a bit anxious.  LABORATORY DATA:  EKG, chest x-ray, and chest CT reviewed.  ASSESSMENT: 1. Recurrent atrial dysrhythmias with a history of postoperative     atrial fibrillation.  I suspect that his acute febrile illness and     questionable pneumonia may have triggered this.  He is     asymptomatic, so he could have been more chronically.  He does have     significant alcohol intake which could be a etiology as well.  His     CHADS VASc score is 3.  He is a candidate for Coumadin based on     clinical data, however, with his alcohol history he is at high risk     of bleed. 2. Coronary artery disease, status post coronary bypass grafting,     stable. 3. History of normal left ventricular systolic function. 4. Hypertension. 5. Hyperlipidemia. 6. Type 2 diabetes. 7. Alcohol abuse.  RECOMMENDATIONS: 1. Continue IV diltiazem and continue outpatient dose of metoprolol,     but increase to 50 mg twice a day. 2. 2-D echocardiogram once ventricular rate slows to risk assess     further CHADS VASc with his edema. 3. Anticoagulation with heparin for now, but would hold off on     Coumadin until further clinical verification.  I     would certainly check LFTs. 4. Consider changing his Actos to metformin assuming his liver is     stable to avoid edema or decrease edema.  We will  follow with you.     Kore Madlock C. Daleen Squibb, MD, [facility]     TCW/MEDQ  D:  09/19/2010  T:  09/19/2010  Job:  [id number]  Electronically Signed by Valera Castle MD [facility] on 10/04/2010 08:19:15 AM

## 2010-10-04 NOTE — Telephone Encounter (Signed)
Pt has medication question?

## 2010-10-10 NOTE — H&P (Signed)
[name], [name]             ACCOUNT NO.:  [id number]  MEDICAL RECORD NO.:  [id number]           PATIENT TYPE:  E  LOCATION:  [facility]                         FACILITY:  [facility]  PHYSICIAN:  Calvert Cantor, M.D.     DATE OF BIRTH:  September 15, 1950  DATE OF ADMISSION:  09/19/2010 DATE OF DISCHARGE:                             HISTORY & PHYSICAL   PRIMARY CARE PHYSICIAN:  [name], MD  CARDIOLOGIST:  Everardo Beals Juanda Chance, MD, [facility]  PRESENTING COMPLAINT:  Shortness of breath.  HISTORY OF PRESENT ILLNESS:  This is a 60 year old male with hypertension, diabetes, depression, gout, paroxysmal AFib, and coronary artery bypass in 2010.  The patient became short of breath about 2 days ago.  This was associated with cough, which is a dry cough.  He developed severe wheezing and severe shortness of breath today.  His wife states that he was panting.  In the ER, he was noted to be in an aflutter and in respiratory distress.  He received some neb treatments, oxygen, and was placed on a Cardizem drip.  His symptoms have improved somewhat, but he still feels that he is breathing fast.  He does not admit to any chest pain or palpitations.  He thinks he has been febrile at home, but has not checked.  PAST MEDICAL HISTORY: 1. Coronary artery disease, status post CABG in 2010. 2. Hypertension. 3. Diabetes, noninsulin requiring. 4. Depression. 5. Gout. 6. Paroxysmal AFib in the past, but never treated with Coumadin. 7. Hernia repair x2. 8. Right wrist surgery. 9. Degenerative disk disease in his neck and lumbar spine.  SOCIAL HISTORY:  He does not smoke.  He drinks three to four 24 ounces beers a day.  He teaches tennis part-time.  No drug use.  He lives with his wife.  ALLERGIES:  COMPAZINE causes stiffness of his jaw.  MEDICATIONS:  He cannot recall all of them, what he can recall are as follows: 1. Lisinopril 20 mg twice a day. 2. Lopressor 50 mg twice a day. 3. Actos, cannot  recall. 4. Neurontin dose, unable to recall. 5. Effexor, he believes is 150 mg twice a day. 6. Allopurinol, he takes it only as needed when he has a gout flare     up.  He has been explained that this is a preventative medication     and needs to be taking daily. 7. Vicodin 10 mg tab every 6 hours as needed for neck and back pain.  REVIEW OF SYSTEMS:  GENERAL:  No recent weight loss or weight gain.  No frequent headaches.  HEENT:  Positive for sinus trouble.  No blurred vision, double vision.  No earache or tinnitus.  RESPIRATORY:  Positive for shortness of breath, cough, and wheezing.  CARDIAC:  No chest pain, palpitations, but had a lot of pedal edema over this past 2 weeks, but this is now resolving.  GI:  No nausea, vomiting, diarrhea, or constipation.  No history of ulcers.  GU:  No dysuria, hematuria. HEMATOLOGIC:  No easy bruising.  SKIN:  No rash.  MUSCULOSKELETAL:  Has lumbar and cervical pain.  NEUROLOGIC:  No history  of strokes or seizures.  No focal numbness or weakness.  PSYCHOLOGIC:  He has depression.  PHYSICAL EXAMINATION:  VITAL SIGNS:  Blood pressure 151/103, pulse 152, respiratory rate 24, temperature 100.8, oxygen saturation 90% on 4 liters of oxygen. HEENT:  Pupils equal, round, and reactive to light.  Extraocular movements are intact.  Conjunctivae pink.  No scleral icterus.  Oral mucosa is moist.  Oropharynx clear. NECK:  Supple.  No thyromegaly, lymphadenopathy, or carotid bruits. HEART:  Regular rate and rhythm.  Tachycardic.  No murmurs, rubs, or gallops. LUNGS:  Coarse breath sounds at bilateral bases with mild wheezing and rhonchi.  He is tachypneic.  No use of accessory muscles. ABDOMEN:  Soft, nontender, nondistended.  Bowel sounds positive.  No organomegaly.  EXTREMITIES:  1+ pitting edema bilaterally.  Pedal pulses positive. NEUROLOGIC:  Cranial nerves II through XII intact.  Strength intact in all 4 extremities.  PSYCHOLOGIC:  Awake, alert, oriented  x3.  Mood and affect normal. SKIN:  Warm and dry.  No rash or bruising.  Blood work:  CBC is within normal limits.  Metabolic panel reveals a sodium of 130 and a glucose of 158.  The rest of it is within normal limits.  BNP is slightly elevated at 378.  UA is negative for infection. Specific gravity is 1.009.  Chest x-ray, one-view reveals mild cardiac enlargement and healing fractures of the right seventh and eighth ribs.  CT scan of the chest with contrast is degraded by breathing motion artifact.  There is no suggestion of significant PE.  There are patchy bilateral lower lobe and right middle lobe densities that could represent early pneumonia.  There were prominent lymph node clusters in the subcarinal region.  There is a 6-mm right lower lobe nodule.  EKG reveals aflutter at 153 beats per minute.  ASSESSMENT AND PLAN: 1. Acute respiratory failure secondary to community-acquired     pneumonia.  The patient has been given Rocephin and Zithromax.  We     will place him on Xopenex as needed for shortness of breath or     wheezing.  I have also ordered some Mucinex.  He is on 4 liters of     O2.  We will continue oxygen to keep his saturations greater than     90%. 2. Atrial flutter.  He is on a Cardizem drip and will be going to the     Step-Down Unit.  I will resume his Lopressor.  Cardiology consult     has been requested. 3. Diabetes mellitus.  We will place him on a sliding scale.  I will     give him 2 mg of Actos for now until we can confirm his home     dosage. 4. Hypertension.  Continue Lopressor and lisinopril.  He is also on     the Cardizem drip. 5. Depression.  We will place him on Effexor once dose is known. 6. Chronic pain in neck and lumbar area.  We will give him Vicodin     p.r.n. 7. Coronary artery disease, status post CABG.  The patient is not on     aspirin. 8. Deep venous thrombosis prophylaxis with Lovenox.  Time on admission was 60  minutes.     Calvert Cantor, M.D.     SR/MEDQ  D:  09/19/2010  T:  09/19/2010  Job:  [id number]  cc:   Valetta Mole. Cato Mulligan, MD Everardo Beals. Juanda Chance, MD, [facility]  Electronically Signed by Calvert Cantor M.D. on  10/10/2010 11:05:46 PM

## 2010-10-10 NOTE — Discharge Summary (Signed)
  [name], [name]             ACCOUNT NO.:  [id number]  MEDICAL RECORD NO.:  [id number]           PATIENT TYPE:  I  LOCATION:  4703                         FACILITY:  [facility]  PHYSICIAN:  Calvert Cantor, M.D.     DATE OF BIRTH:  03/05/1951  DATE OF ADMISSION:  09/19/2010 DATE OF DISCHARGE:  09/24/2010                              DISCHARGE SUMMARY   PRIMARY CARE PHYSICIAN:  [name], MD  CARDIOLOGIST:  Everardo Beals Juanda Chance, MD, [facility].  PRESENTING COMPLAINT:  Shortness of breath.  DISCHARGE ON DIAGNOSES: 1. Respiratory failure, ventilatory dependent secondary to bilateral     pneumonia. 2. Bilateral community-acquired pneumonia. 3. Paroxysmal atrial fibrillation. 4. Alcohol abuse and alcohol withdrawal during hospital stay. 5. Hypertension, uncontrolled. 6. Diabetes mellitus, well controlled with an A1c of 6.4. 7. Depression. 8. Chronic back and neck pain. 9. History of coronary artery disease. 10.Gout.  DISCHARGE MEDICATIONS:  New medications are as follows. 1. Allopurinol 100 mg daily. 2. Diltiazem CD/ER 120 mg daily. 3. Folic acid 1 mg daily. 4. Levaquin 750 mg daily to complete a 14-day course of antibiotics. 5. Multivitamins once a day. 6. Senna 2 tablets as needed for constipation. 7. Thiamine 100 mg daily. 8. Toprol-XL 50 mg daily. 9. Actos 15 mg daily. 10.Aspirin 81 mg daily. 11.Effexor XR 150 mg twice a day. 12.Gabapentin 300 mg 3 times a day as needed for pain. 13.Hydrocodone/acetaminophen 5/325 one tablet q.8 hours as needed. 14.Lisinopril 20 mg twice a day. 15.Prilosec OTC 1 capsule daily as needed.  The patient was taking metoprolol 25 mg twice a day at home.  He is advised that if he wants to finish of this bottles of medication, he needs to take it 50 mg twice a day.  Once this is finished, he can start his Toprol-XL which is 50 mg daily.  CONSULTANTS: 1. Thomas C. Wall, MD, [facility] from [facility]. 2. Critical Care Consult.  HOSPITAL  COURSE: 1. Bilateral pneumonia.  The patient presented with a complaint of 2     days of shortness of breath and wheezing.  Chest x-ray in the ER     revealed mild cardiac enlargement and fractures of the right 7th     and 8th rib  DICTATION ENDS AT THIS POINT.     Calvert Cantor, M.D.     SR/MEDQ  D:  09/24/2010  T:  09/25/2010  Job:  [id number]  Electronically Signed by Calvert Cantor M.D. on 10/10/2010 11:04:03 PM

## 2010-10-10 NOTE — Discharge Summary (Signed)
[name], [name]             ACCOUNT NO.:  [id number]  MEDICAL RECORD NO.:  [id number]           PATIENT TYPE:  I  LOCATION:  4703                         FACILITY:  [facility]  PHYSICIAN:  Calvert Cantor, M.D.     DATE OF BIRTH:  1950/12/20  DATE OF ADMISSION:  09/19/2010 DATE OF DISCHARGE:  09/24/2010                              DISCHARGE SUMMARY   ADDENDUM:  Acute respiratory failure secondary to community-acquired pneumonia.  The patient was started on Rocephin and Zithromax, nebulizer treatments, and oxygen.  He required 4 liters of oxygen to keep his saturations above 90%..  On antibiotics, his pneumonia resolved quickly.  He is essentially asymptomatic now except for a mild dry cough.  He has had 7 days of antibiotic and needs seven more.  While in the hospital, he was on Rocephin and Zithromax.  I am discharging him on Levaquin.  Atrial flutter.  The patient was admitted with heart rate that was rapid in the 150s.  Cardizem drip was started.  He was monitored in the step- down unit and eventually converted to normal sinus rhythm with.  A Cardiology consult was requested.  They were hesitant to start him on Coumadin due to his drinking problem and therefore for now, he needs to continue on aspirin 81 mg daily.  I have advised the patient that if he is able to stop drinking, he needs to let his primary care physician know and he needs to be started on Coumadin.  Cardizem has been added to his medications and his Toprol has been increased by Cardiology.  A 2-D echo revealed EF of 60-65% with a mildly dilated left atrium.  RV was poorly visualized.  RA was poorly visualized.  Alcohol abuse.  The patient underwent withdrawal, which was moderate. He does go to an outpatient alcohol rehab program.  Despite attending this program, he was drinking alcohol prior to coming to the hospital. Alcohol cessation has been discussed with him extensively.  He is going to go back to his  outpatient rehab program.  Gout.  The patient was not taking his allopurinol as he should have thinking that it was only to be taking when he had exacerbations. Therefore, he is receiving a new prescription for allopurinol from me and is advised to take this daily.  OTHER PERTINENT RADIOLOGICAL IMAGING:  CT angiogram of the chest performed September 19, 2010, reveals patchy bilateral lower lobe and right middle lobe densities, which could represent early pneumonia, prominent lymph nodes cluster in the subcarinal region and 6-mm right lower lobe nodule.  Six months follow up for the nodule and enlarged lymph nodes was recommended.  PHYSICAL EXAMINATION:  LUNGS:  Clear. HEART:  Regular rate and rhythm.  No murmurs. ABDOMEN:  Soft, nontender, and nondistended.  Bowel sounds positive. EXTREMITIES:  No cyanosis, clubbing, or edema.  CONDITION ON DISCHARGE:  Stable.  FOLLOWUP INSTRUCTIONS: 1. Follow up with Bruce Swords in 1-2 weeks. 2. He needs a followup CT of chest in 6 months. 3. He can follow up with Cardiology as well in 1-2 weeks.  We will     give him the number  for [facility].  TIME ON DISCHARGE:  Sixty five minutes.     Calvert Cantor, M.D.     SR/MEDQ  D:  09/24/2010  T:  09/25/2010  Job:  [id number]  cc:   [name], MD  Electronically Signed by Calvert Cantor M.D. on 10/10/2010 11:05:26 PM

## 2010-10-16 NOTE — Assessment & Plan Note (Signed)
[facility]                                 ON-CALL NOTE   [name], [name]                      MRN:          [id number]  DATE:09/20/2007                            DOB:          [birth year]    PRIMARY CARDIOLOGIST:  Dr. Lewayne Bunting   [name] is a 60 year old male who had a cardiac catheterization on  September 18, 2007 and needs bypass surgery.  He called because he stated  his discharge instructions advised him that he should let us know if he  had any problems with his catheterization site.  He stated he had two  small areas of bruising less than 2 inches from the catheterization site  itself, but there was no edema, no hematoma and no drainage.  I advised  him that he should mark those areas and keep an eye on them, and if they  begin expanding or if he noted any drainage or swelling in the area he  should give [place] an immediate call back.  He stated he would do so.  He  stated he was not having any significant pain or discomfort at the  catheterization site and was otherwise doing well with no chest pain or  shortness of breath.  He stated he was compliant with his medications  and would keep all follow-up appointments.      Theodore Demark, PA-C  Electronically Signed      Gerrit Friends. Dietrich Pates, MD, [facility]  Electronically Signed   RB/MedQ  DD: 09/20/2007  DT: 09/20/2007  Job #: [id number]

## 2010-10-16 NOTE — Assessment & Plan Note (Signed)
[facility]                         GASTROENTEROLOGY OFFICE NOTE   NAME:MCCARTYBrier, [name]                    MRN:          [id number]  DATE:05/21/2007                            DOB:          1951-04-17    ADDENDUM:  He did not remember his colonoscopy in 2004, though he took  100 mcg Fentanyl and 14 mg Versed.  Would start with Benadryl up front  to try to improve the sedation.     Iva Boop, MD,FACG     CEG/MedQ  DD: 05/21/2007  DT: 05/21/2007  Job #: [id number]

## 2010-10-16 NOTE — H&P (Signed)
HISTORY AND PHYSICAL EXAMINATION   September 21, 2007   Re:  [name], [name]       DOB:  [birth year]   CHIEF COMPLAINT:  Chest pain.   HISTORY OF PRESENT ILLNESS:  [name] is a 59 year old male with  history of hypertension, hyperlipidemia, anxiety, and a family history  of coronary artery disease.  The patient recently was evaluated by Dr.  Lewayne Bunting for new onset symptoms of chest pain that were somewhat  atypical.  However, the patient underwent a stress Myoview scan which  was abnormal and suggestive of inferior wall ischemia.  The patient  subsequently underwent elective cardiac catheterization on September 18, 2007 by Dr. Juanda Chance.  The patient was found to have severe two-vessel  coronary artery disease with coronary anatomy unfavorable for  percutaneous coronary intervention.  There is normal left ventricular  function.  Cardiothoracic surgical consultation has been requested.   REVIEW OF SYSTEMS:  GENERAL:  The patient reports normal appetite.  He  has gained a few pounds of weight recently due to decreased physical  activity.  CARDIAC:  The patient describes a 5-6 weeks history of  progressive symptoms of chest pain.  The patient describes substernal  pressure-like pain that frequently is associated with physical activity  but occasionally occurs at rest.  These symptoms have increased in  frequency and severity.  The patient recently was started on a  prescription for sublingual nitroglycerin, and the symptoms are usually  promptly relieved following administration of nitroglycerin.  They are  not associated with shortness of breath, diaphoresis or nausea.  The  patient denies any prolonged episodes of chest pain, although he has had  frequent episodes of nocturnal pain causing him difficulty sleeping at  night.  The patient has mild exertional shortness of breath.  The  patient denies palpitations, dizzy spells or syncope.  RESPIRATORY:  Negative.  The  patient reports no productive cough, hemoptysis, nor  wheezing.  GASTROINTESTINAL:  Negative.  The patient has no difficulty  swallowing.  He denies hematochezia, hematemesis, melena.  He does have  some mild hemorrhoids.  GENITOURINARY:  Negative.  PERIPHERAL VASCULAR:  Negative.  NEUROLOGIC: Negative.  PSYCHIATRIC:  Notable for long history  of anxiety, depression and difficulty with stress.  HEENT:  Negative.  MUSCULOSKELETAL:  Notable for chronic pain in his back related to  degenerative disk disease of the cervical and lumbosacral spine.   PAST MEDICAL HISTORY:  1. Hypertension.  2. Hyperlipidemia.  3. Chronic anxiety.  4. Chronic back pain.  5. 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   PAST SURGICAL HISTORY:  1. Right inguinal hernia repair.  2. Left inguinal hernia repair.  3. Tonsillectomy.   FAMILY HISTORY:  The patient's father had a heart attack at a young age  and underwent bypass surgery in his 42s.   SOCIAL HISTORY:  The patient is single but lives with very supportive  significant other here in [place].  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  He is  a nonsmoker.  He drinks alcohol most evenings, usually three or four  beers daily.   CURRENT MEDICATIONS:  1. Lisinopril/hydrochlorothiazide 20/12/25 1 tablet twice daily.  2. Effexor 150 mg daily.  3. Metoprolol 25 mg daily.  4. Aspirin 81 mg daily.  5. Flexeril as needed for back pain.  6. Darvocet as needed for  back pain.   DRUG ALLERGIES:  COMPAZINE.   PHYSICAL EXAM:  The patient is a well-appearing male who appears his  stated age in no acute distress.  Blood pressure is 151/97, pulse 100,  oxygen saturation 98% on room air.  HEENT: Grossly unrevealing.  NECK:  Supple.  There are no cervical or supraclavicular  lymphadenopathy.  There is no jugular venous distention.  CHEST:  Auscultation of the chest  demonstrates clear breath sounds which  are symmetrical bilaterally.  No wheezes or rhonchi demonstrated.  CARDIOVASCULAR:  Regular rate and rhythm.  No murmurs, rubs or gallops  are noted.  ABDOMEN:  Soft, mildly obese and nontender.  There are no palpable  masses.  Bowel sounds are present.  EXTREMITIES:  Warm and well-perfused.  There is no lower extremity  edema.  Distal pulses are palpable in both lower legs at the ankle.  Palmar arch circulation in the left hand appears intact with normal  circulation despite compression of the left radial pulse for an Allen's  test.  The remainder of physical exam is unrevealing.  NEUROLOGIC:  Grossly nonfocal and symmetrical throughout.  RECTAL/GU:  Both deferred.   DIAGNOSTIC TESTS:  Cardiac catheterization performed by Dr. Juanda Chance is  reviewed.  This demonstrates severe two-vessel coronary artery disease  with normal left ventricular function.  Specifically there is 70-80%  stenosis of the mid left circumflex coronary artery before it gives rise  to a very large second circumflex marginal branch.  There is 80-90%  stenosis of the distal left circumflex beyond takeoff of the large  second circumflex marginal branch.  The terminal branch of the left  circumflex is somewhat small and may or may not be large enough for  grafting.  There is 100% occlusion of the mid-right coronary artery.  The left anterior descending coronary artery is a large vessel and there  are collaterals perfusing the distal right coronary artery and its  branches that wrap the apex of the heart.  There is perhaps 30-40%  stenosis of the left anterior descending coronary artery at most.  There  is 40-50% stenosis of the diagonal branch.   IMPRESSION:  Severe two-vessel coronary artery disease with accelerating  symptoms of chest pain consistent with angina pectoris.  [name] is  having increasing problems with symptoms of chest pain, and his symptoms  are usually  promptly relieved with sublingual nitroglycerin.  His  coronary anatomy appears relatively unfavorable for percutaneous  coronary intervention.  He does not have high-grade disease involving  the left anterior descending coronary artery or its branches.  Overall I  suspect that he could be treated medically, although he is having a fair  amount of symptoms and as such he might best be treated in the long run  with surgical revascularization.   PLAN:  I have discussed options at length with [name] and his  friend here in the office today.  Alternative treatment strategies have  been discussed in detail.  They understand and accept all associated  risks of surgery including but not limited to risk of death, stroke,  myocardial infarction, congestive heart failure, respiratory failure,  pneumonia, bleeding requiring blood transfusion, arrhythmia, infection,  and recurrent coronary artery disease.  All their questions have been  addressed.  We tentatively plan to proceed with surgery on Thursday,  October 01, 2007.  We will plan to use left internal mammary artery and  left radial arteries for grafts.  We will preserve his right internal  mammary artery for the time being, as I do not suspect that it would  reach the distal right coronary artery or its branches.  If the terminal  branch of the  left circumflex is graftable we will plan to use a vein graft at the  time of surgery.  All of their questions have been addressed.   [name]. [name], M.D.  Electronically Signed   CHO/MEDQ  D:  09/21/2007  T:  09/21/2007  Job:  [id number]   cc:   Learta Codding, MD,FACC  Everardo Beals. Juanda Chance, MD, [facility]  Bruce H. Swords, MD

## 2010-10-16 NOTE — Assessment & Plan Note (Signed)
[facility]                         GASTROENTEROLOGY OFFICE NOTE   NAME:[name], [name]                    MRN:          [id number]  DATE:05/21/2007                            DOB:          02/26/1951    CHIEF COMPLAINT:  Rectal bleeding.   HISTORY:  [name] is a 60 year old white man who had a colonoscopy  in 2004.  He had mild-to-moderate diverticulosis and internal/external  hemorrhoids.  For the past month or so, perhaps longer, he has been  having some intermittent bright red blood per rectum and perhaps  streaked on the stool.  He actually has had blood in his underwear as  well.  There is no significant change in bowel habits.  He does not have  any pain with defecation.   His GI review of systems is otherwise negative.   MEDICATIONS:  1. Lisinopril 10 mg b.i.d. (not sure of dose).  2. Simvastatin 10 mg daily.  3. Ibuprofen as needed.   DRUG ALLERGIES:  COMPAZINE.  He has had a dystonic reaction.   PAST MEDICAL HISTORY:  1. Hypertension.  2. Dyslipidemia.  3. Wrist surgery, 2002.  4. Migraine headaches.  5. Hernia repair in 1987, 2002.  6. Diverticulosis and hemorrhoids at colonoscopy.  There was a fair      prep on the colonoscopy in 2004.   FAMILY HISTORY:  Father had heart disease.  There is no colon cancer.   SOCIAL HISTORY:  He is single.  His lives with his partner.  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  He usually drinks two beers a week.   REVIEW OF SYSTEMS:  See medical history form.  There are some back pain  problems.  He has had some urinary hesitancy, frequency, and maybe even  some nocturia.   PHYSICAL EXAMINATION:  A well-developed and well-nourished white male.  Height 6 foot 1.  Weight 205 pounds.  Blood pressure 160/108 (he did not take his blood pressure medicine this  morning).  Pulse 80.  Eyes are anicteric.  NECK:  Supple.  ABDOMEN:  Soft and  nontender without organomegaly or mass.  There are  hernia repair scars noted.  RECTAL:  Brown stool.  No mass.  There is the perception of a hemorrhoid  in the anodermal area with minimal swelling and violaceous  discoloration.  He is alert and oriented x3.   ASSESSMENT:  Rectal bleeding, likely hemorrhoidal but cannot exclude  other more serious causes such a colorectal neoplasia in a 60 year old  man.  His previous colonoscopy was 4-1/2 years ago.  I think there was a  long enough interval now to indicate a repeat colonoscopy.  Because of  his prep issues previously, I thought he ought to have a colonoscopy at  [age] years on a routine basis anyway.   PLAN:  1. Anusol-HC suppositories daily for 7 days, then as needed.  2. A colonoscopy to investigate the bleeding and to be more sure that      his hemorrhoids will determine if it is not.  Risks, benefits and      indications previously reviewed.  I appreciate the opportunity to      care for this patient.   ADDENDUM:  He did not remember his colonoscopy in 2004, though he took  100 mcg Fentanyl and 14 mg Versed.  Would start with Benadryl up front  to try to improve the sedation.     Michael Boop, MD,FACG  Electronically Signed    CEG/MedQ  DD: 05/21/2007  DT: 05/21/2007  Job #: [id number]   cc:   Michael Mole. Swords, MD

## 2010-10-16 NOTE — Discharge Summary (Signed)
[name], HEDGLIN NO.:  [id number]   MEDICAL RECORD NO.:  [id number]          PATIENT TYPE:  INP   LOCATION:  2012                         FACILITY:  [facility]   PHYSICIAN:  [name]. [name], M.D. DATE OF BIRTH:  [birth year]   DATE OF ADMISSION:  10/09/2007  DATE OF DISCHARGE:  10/28/2007                               DISCHARGE SUMMARY   FINAL DIAGNOSIS:  Severe 2-vessel coronary artery disease.   IN-HOSPITAL DIAGNOSES:  1. Acute blood loss anemia postoperatively.  2. Volume overload postoperatively.  3. Alcohol withdrawal postoperatively with severe delerium tremens.  4. Aspiration pneumonia, bilateral infiltrates.  5. Mild to moderate pharyngeal dysphagia.  6. Postoperative atrial fibrillation.  7. Right forearm catheter-related infection, methicillin-resistant      Staphylococcus aureus.  8. Right forearm thrombophlebitis.   SECONDARY DIAGNOSES:  1. Hypertension.  2. Hyperlipidemia.  3. Chronic anxiety.  4. Chronic back pain.  5. Degenerative disk disease of lumbosacral spine and cervical spine.  6. Status post right inguinal hernia repair.  7. Status post left inguinal hernia repair.  8. Status post tonsillectomy.  9. Chronic alcohol abuse.   IN-HOSPITAL OPERATIONS AND PROCEDURES:  Coronary bypass grafting x3  using left internal mammary artery to second circumflex marginal branch,  left radial artery to posterior descending coronary artery, saphenous  vein graft to diagonal branch.  Endoscopic saphenous vein harvest from  right thigh done.   HISTORY AND PHYSICAL AND HOSPITAL COURSE:  The patient is a 60 year old  male with history of hypertension, hyperlipidemia, chronic anxiety,  chronic back pain, and alcohol abuse.  The patient also has family  history of coronary artery disease.  The patient has had recent onset of  symptoms of chest pain, which prompted a stress Myoview exam, which was  abnormal.  Cardiac catheterization revealed severe  2-vessel coronary  artery disease with coronary anatomy unfavorable for percutaneous  coronary intervention.  Following catheterization, Dr. Cornelius [name] was  consulted.  Dr. Cornelius [name] saw and evaluated the patient.  He discussed with  the patient undergoing coronary artery bypass grafting.  Risks and  benefits were discussed with the patient.  The patient was scheduled for  surgery for Oct 09, 2007.  For details of the patient's past medical  history and physical exam as well as preoperative complications, please  see dictated H&P by Dr. Cornelius [name].   The patient was taken to the operating room on Oct 09, 2007, where he  underwent coronary artery bypass grafting x3 using a left internal  mammary artery to second circumflex marginal branch, left radial artery  to posterior descending coronary artery, saphenous vein graft to  diagonal branch, endoscopic saphenous vein harvest from right thigh.  The patient tolerated this procedure and was transferred to the  intensive care unit in stable condition.  Initially, postoperatively,  the patient was noted to be hemodynamically stable.  He was extubated  the evening of surgery.  Post-extubation, the patient was alert and  oriented.  The patient does have a history of alcohol use as noted  above.  Unfortunately, the patient went into alcohol withdrawal and DTs  in the  first several days postoperatively.  He became confused and quite  agitated.  Ativan was initially ordered, but did not help with the  symptoms.  Unfortunately, the patient's confusion and agitation  worsened.  Critical care was consulted and it was felt that the patient  to be reintubated.  He was reintubated on Oct 11, 2007.  He was placed  on a large amount of medication for sedation.  Critical care continued  to manage the patient with alcohol withdrawal protocol.  The patient was  noted to be stabilized and was able to be re-extubated on Oct 14, 2007.  Post-extubation, the patient's agitation was  improved, still had mild  confusion.  Critical care had the patient on Ativan drip protocol and  was managing this.  The patient's confusion slowly started to improve  over the next several days.  He eventually improved completely.  The  patient was able to be weaned from the Ativan drip prior to discharge  home.  He was restarted on Effexor dose that he was taking at home.  Prior to discharge, wife was concerned about the patient's mental status  and felt that he was coming more depressed.  Psych was consulted for  further evaluation.  He was seen by Dr. Electa [name] on Oct 27, 2007, for  further evaluation.  Dr. Electa [name] felt the patient would benefit from  increased dose of Effexor.  He was switched from twice a day of Effexor  75 mg to 3 times per day.  Dr. Electa [name] discussed with family if they  have any concerns they can contact him.   From a pulmonary standpoint post-extubation following surgery, the  patient was able to be placed on 2 liters nasal cannula.  His chest x-  ray was clear.  He had minimal drainage from chest tubes and these tubes  were able to be discontinued in the normal fashion.  Repeat chest x-ray  remained stable.  The patient was attempted to be encouraged using his  incentive spirometer.  Post-re-intubation, the patient developed a fever  of 101.  There was a question that he had developed aspiration  pneumonia.  Chest x-ray showed bilateral infiltrates.  The patient was  started on Unasyn.  Fevers resolved as well as bilateral infiltrates.  Unasyn was continued for several days and then discontinued.  Post-re-  extubation, the patient was again encouraged to use his incentive  spirometer.  He was able to be weaned off oxygen with sats greater than  90% on room air.   From a cardiac standpoint, the patient was found to be normal sinus  rhythm initially postoperatively.  He did require nitroglycerine and Neo-  Synephrine postoperatively.  Blood pressure and heart  rate remained  stable and these were able to be weaned and discontinued by postop day  #1.  The patient was maintained on a monitor and followed closely.  A  low-dose beta-blocker was started for blood pressure and heart rate, he  tolerated it well.  Post-re-extubation on Oct 18, 2007, the patient went  into rapid atrial fibrillation.  He was started on IV amiodarone.  Following initiation of IV amiodarone, the patient was able to convert  back to normal sinus rhythm.  His heart rate was continued to be  monitored.  He remained in normal sinus rhythm and amiodarone was  discontinued secondary to the patient's liver insufficiency.  Heart rate  was continued to be monitored and remained in normal sinus rhythm in the  remainder of his  hospital course.  The patient's blood pressure remained  stable as well.   The patient developed recurrent temperature post-re-extubation up to  102.4.  Cultures were sent.  They came back showing positive for MRSA  from an old PICC line.  He was started on IV vancomycin.  Plan was to  continue the patient on this for 7 days.  During this time, the  patient's right forearm was noted to have developed thrombophlebitis  from the amiodarone site.  The patient had gotten 6/7 days of vancomycin  when his IV clotted off.  There was no place to re-access peripheral IV.  Switched antibiotics to p.o. doxycycline and plan to continue for a  total of 1 week postdischarge.  Right forearm thrombophlebitis was  improving prior to discharge.  His fevers also improved.   Postoperatively, the patient did develop acute blood loss anemia.  His  hemoglobin/hematocrit dropped to 7.8 and 23%.  He received 1 unit of  packed red blood cells within the first several days postoperatively.  The hemoglobin/hematocrit were monitored and remained stable until Oct 25, 2007, when dropped to 7.5 and 21.9%.  The patient received 1 more  unit of packed red blood cells.  He was also noted to  be n.p.o. iron.  Hemoglobin/hematocrit improved posttransfusion and remained stable  through the remainder of postoperative course.  On Oct 27, 2007, H&H was  8.6 and 25.0.  Postoperatively, the patient also had some volume  overload requiring diuretics.  He received several doses of IV  diuretics.  The patient was noted to have mild to moderate peripheral  edema and was continued on IV diuretics.  Daily weights were obtained  and the patient was below preoperative weight prior to discharge home.  I felt peripheral edema had improved and planned to continue the patient  on TED hose, but did not require diuretics at home.   Post-re-extubation, speech pathology was consulted for evaluation.  A  feed study was performed on Oct 15, 2007, which showed the patient  demonstrated mild oral and mild to moderate pharyngeal dysphagia.  They  recommended a dysphagia-I diet with honey-thick liquids and full  supervision.  The patient tolerated the diet well and a repeat modified  barium swallow was done on Oct 22, 2007, which showed swallow function  much improved from feed study.  I recommended dysphagia III diet with  thin liquids.  The patient was able to tolerate this diet well.  Noted  that prior during the patient's reintubation.  A Panda tube was placed  and he was started on tube feeds.  Nutrition was consulted and managed  tube feeds.  Once speech therapy evaluated and the patient started  tolerating diet, the tube feeds discontinued.  Panda was discontinued  prior to discharge.   Physical therapy was consulted during the patient's hospital course.  He  was slow to progress.  Physical therapy continued to work with the  patient until discharge home.  He was slowly improving and recommended  for home health PT to continue improving the patient's strength.   On Oct 28, 2007, the patient was noted to be afebrile.  He was in normal  sinus rhythm.  Blood pressure stable.  O2 sat was greater  than 90% on  room air.  Labs on Oct 27, 2007, showed a white count of 6.8, hemoglobin  of 8.6, hematocrit of 25.0, and platelet count of 783.  Sodium of 133,  potassium of 4.5, chloride of 98, bicarbonate  of 28, BUN of 10,  creatinine of 1.34, and glucose of 117.  The patient was out of bed and  ambulating with assistance.  He was tolerating diet well.  All incisions  were clean, dry, and intact and healing well.  The patient was felt to  be stable and ready for discharge home today on Oct 28, 2007.  He was  stable from critical care standpoint as stated in progress note.  The  Ativan protocol has been weaned.   FOLLOW-UP APPOINTMENTS:  Followup appointment has been arranged with Dr.  Cornelius [name] for November 16, 2007, at 1:15 p.m..  The patient will need to obtain  PA and lateral chest x-ray 30 minutes prior to this appointment.  The  patient will need to follow up with Dr. Andee Lineman in 2 weeks.  He will need  to contact his office to make these arrangements.  The patient will need  to follow up with Dr. Ala Dach and Dr. Electa [name] as directed.   ACTIVITY:  The patient is instructed no driving until released to do so.  No lifting over 10 pounds.  He is told to ambulate 3-4 times per day,  progress as tolerated and continue his breathing exercises.   INCISIONAL CARE:  The patient is told to shower and washing his  incisions using soap and water.  He is to contact the office if he  develops any drainage or opening from any of his incision sites.   DIET:  The patient is educated on diet to be low fat and low salt and  also educated to stop drinking.   DISCHARGE MEDICATIONS:  1. Doxycycline 200 mg b.i.d..  2. Folic acid 1 mg daily.  3. Aspirin 325 mg daily.  4. Lopressor 50 mg b.i.d.  5. Thiamine 100 mg daily.  6. Ferrous gluconate 325 mg daily.  7. Multivitamin daily.  8. Oxycodone 5 mg 1-2 tablets q.4-6 hours p.r.n.      Theda Belfast, PA      [name]. [name], M.D.  Electronically  Signed    KMD/MEDQ  D:  10/28/2007  T:  10/29/2007  Job:  [id number]   cc:   [name]. [name], M.D.  Learta Codding, MD,FACC  Anselm Jungling, MD  Ranelle Oyster, M.D.

## 2010-10-16 NOTE — Assessment & Plan Note (Signed)
OFFICE VISIT   [name], [name]  DOB:  1951/05/31                                        Oct 02, 2007  CHART #:  [id number]   This dictation is to document the telephone conversation that I had  personally with [name] on September 30, 2007.  I had originally  seen [name] in consultation on April 20 and we had made plans for  elective coronary artery bypass grafting on April 30.  Preoperative  blood work obtained on April 28 was notable for hyponatremia with serum  sodium 124.  The remainder of the patient's blood work was normal.  Serum sodium prior to cardiac catheterization on April 16 was 134.  I  telephoned [name] to inquire if he had noticed any problems or  began taking any new medications during the interim period of time, and  requested that he come in for repeat blood work to make sure that the  low sodium level was not spurious.  During our conversation it became  clear that [name] had been drinking, and he admitted to have been  drinking some beers the day previous to our conversation at the time  of his preoperative lab work, as well as on the morning of April 29.  I  suggested that he stop drinking alcoholic beverages, and requested that  he come in so that we could repeat blood work to make sure that the  serum sodium level was accurate and that there were no other  explanations for his new-onset hyponatremia.  [name] was initially  not interested in cooperating, and after considerable discussion we  elected to postpone his surgery so that we could allow his serum  electrolytes to normalize.  I specifically requested him to come the  following morning, on April 30, to allow [place] to obtain some blood work.  We also discussed that I would like to see him in the office on Monday,  May 4, for further follow-up.  He expressed that he understood this plan  and seemed  agreeable, although he also seemed preoccupied with  other plans that he  was tending to at the time.   [name]. [name], M.D.  Electronically Signed   CHO/MEDQ  D:  10/02/2007  T:  10/02/2007  Job:  [id number]   cc:   Learta Codding, MD,FACC  Everardo Beals. Juanda Chance, MD, [facility]  Bruce H. Swords, MD

## 2010-10-16 NOTE — Assessment & Plan Note (Signed)
[facility]                          [facility] NOTE   NAME:[name], [name]                    MRN:          [id number]  DATE:08/27/2007                            DOB:          06/10/1950    REFERRING PHYSICIAN:  [name], MD   HISTORY OF PRESENT ILLNESS:  The patient is a 60 year old male with a  strong family history of coronary arteries.  The patient underwent  cardiac CT scan in 2005 which demonstrated calcium score of zero and no  evidence of soft or calcific plaque.  The ejection fraction was 61%.   The patient is now, due to the worsening economy, under significant  amount of stress.  He has occasional sensation of heartburn at night.  He also reports occasional chest tightness which does seem to be  occurring at night, with radiation in the shoulders and down both arms.  He is not quite clear whether this is related to his chronic back  injury.  Activity and walk up a flight of stairs however, does not seem  to be causing any chest pain or arm pain.  He has no palpitations or  syncope, orthopnea or PND.   MEDICATIONS:  1. Lisinopril 20 mg p.o. daily.  2. Effexor 150 mg p.o. daily.   PHYSICAL EXAMINATION:  VITAL SIGNS:  Blood pressure 130/90, heart rate  116 beats per minute, weight 205 pounds.  NECK:  Normal carotid stroke, no carotid bruits.  LUNGS:  Clear breath sounds bilaterally.  HEART:  Regular rate and rhythm.  Normal S1, S2, no murmurs, gallops.  ABDOMEN:  Soft, nontender.  No rebound or guarding.  Good bowel sounds.  EXTREMITIES:  No cyanosis, clubbing or edema   STUDIES:  A 12-lead electrocardiogram sinus tachycardia but no acute  ischemic change of prior infarct pattern.   PROBLEM LIST:  1. Atypical chest pain.  2. Anxiety.  3. Normal cardiac CT scan with zero calcium score in 2005.  4. Strong family history of coronary artery disease.  5. Hypertension, poorly controlled.   PLAN:  1. I told the  patient that I do not think that his pain syndrome is      related to angina.  He is at low risk given his prior cardiac CT.  2. I did add metoprolol ER 25 mg p.o. daily to the patient's medical      regimen given his level of anxiety and elevated blood pressure.  3. I have scheduled the patient for an Adenosine Cardiolite study to      rule out ischemia.  4. I have asked the patient to follow up with Dr. Cato Mulligan, particularly      his lipid levels and consider statin drug therapy if outside of the      guidelines.Learta Codding, MD,FACC  Electronically Signed    GED/MedQ  DD: 08/27/2007  DT: 08/28/2007  Job #: [id number]   cc:   [name], MD

## 2010-10-16 NOTE — Cardiovascular Report (Signed)
NAMEKORTLAND, NICHOLS             ACCOUNT NO.:  [id number]   MEDICAL RECORD NO.:  [id number]          PATIENT TYPE:  OIB   LOCATION:  1961                         FACILITY:  [facility]   PHYSICIAN:  Bruce R. Juanda Chance, MD, FACCDATE OF BIRTH:  13-Sep-1950   DATE OF PROCEDURE:  09/18/2007  DATE OF DISCHARGE:  09/18/2007                            CARDIAC CATHETERIZATION   CLINICAL HISTORY:  Mr. Ines is 60 years old and is known to me  through tennis.  He was a Armed forces operational officer at [employer].  He currently  has back and neck problems and is not currently working.  He recently  saw Dr. Andee Lineman for symptoms of chest pain, which was thought to be  atypical.  He had a Myoview scan which was positive for inferior  ischemia and we scheduled him for evaluation with angiography today.  His father had bypass surgery in his 58s.   PROCEDURE:  The procedure was performed with the right femoral artery  and arterial sheath and 4-French pyriform coronary catheters.  A front  wall arterial puncture was performed, and Omnipaque contrast was used.  The patient tolerated the procedure well and left the laboratory in  satisfactory condition.   RESULTS:  The aortic pressure was 127/85 with a mean of 105, left  ventricular pressure was 127/31.   Left Main Coronary Artery:  The left main coronary artery was free of  significant disease.   Left Anterior Descending Artery:  The left anterior descending artery  gave rise to a large diagonal branch and several small septal  perforators.  There was 50% ostial stenosis of the diagonal branch.  There was 40% narrowing in the mid vessel.   Circumflex Artery:  The circumflex artery gave rise to marginal branch  and two large posterolateral branches.  There were 50 and 70-80%  stenoses in the proximal to mid vessel.  There was 40% narrowing in the  mid vessel.  There were tandem 80% stenosis in the distal vessel, which  was relatively small before the second  posterolateral branch.   Right Coronary Artery:  The right coronary artery was completely  occluded in its mid-to-distal portion.  There appeared to be thrombus  distal at the site of the occlusion over about 2 cm.  Distal vessel  filled by well-developed collaterals from the LAD.  This consisted of a  large posterior descending and a moderately large posterolateral branch.   Left Ventriculogram:  The left ventriculogram was performed on RAO  projection showed hypokinesis of the inferobasal and mid wall.  The  overall wall motion was good with an estimated ejection fraction of 60%.   CONCLUSION:  Coronary artery disease with 40% narrowing in the proximal  LAD and 50% narrowing in the first diagonal branch, 70-80% stenosis in  the proximal circumflex artery with 40% narrowing in the mid circumflex  artery and 80% narrowing in the distal circumflex artery and total  occlusion of the right coronary artery with mild inferior wall  hypokinesis and estimated ejection fraction of 60%.   RECOMMENDATIONS:  I reviewed the films with Dr. Excell Seltzer.  The lesions  would be difficult for percutaneous coronary intervention and there is  some uncertainty whether a good result could be obtained in the right  coronary  artery.  The patient also has a very large plaque burden.  I am  currently leaning toward bypass surgery.  I will also review these with  Dr. Riley Kill before we  make a final recommendation and we will discuss  the findings with Dr. Andee Lineman.      Bruce Elvera Lennox Juanda Chance, MD, [facility]  Electronically Signed     BRB/MEDQ  D:  09/18/2007  T:  09/18/2007  Job:  [id number]   cc:   Learta Codding, MD,FACC  [name], MD  Cardiopulmonary Lab

## 2010-10-16 NOTE — Assessment & Plan Note (Signed)
[facility]                            CARDIOLOGY OFFICE NOTE   NAME:[name], [name]                    MRN:          [id number]  DATE:01/27/2008                            DOB:          July 03, 1950    PRIMARY CARE PHYSICIAN:  Michael Mole. Swords, MD   CARDIOVASCULAR SURGEON:  Michael Decent. [name], MD   CLINICAL HISTORY:  [name] is 60 year old and returns for a  followup management of coronary heart disease.  Three months ago, he  underwent bypass surgery for severe 3-vessel disease.  He has good LV  function.  He has done well since that time and has gradually increased  his activity.  He is back working part time.  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  He has been doing well from the  standpoint of his heart with no recent chest pain, shortness breath, or  palpitations.  He has had problems related to cervical and lumbar spine  disease with pain in his legs and neck and arms.   His past medical history is significant for hyperlipidemia as well as  the lumbar and cervical spine disease.  He has seen [name] in the  neurosurgical group for his lumbar and cervical spine disease and has  had previous MRIs.   His current medications include simvastatin, although he is not sure of  the dosage of lisinopril, metoprolol, Effexor, and aspirin.   On examination, the blood pressure is 145/93 and the pulse 81 and  regular.  There was no venous distention.  The carotid pulses were full  without bruits.  Chest was clear.  Heart rhythm was regular.  No  murmurs, rubs, or gallops.  Abdomen was soft with normal bowel sounds.  Peripheral pulses were full.  There was 1+ edema mostly in the ankles  and see with very little pretibial edema.   Electrocardiogram showed sinus rhythm and left atrial enlargement.   IMPRESSION:  1. Coronary artery disease status  post coronary bypass graft surgery      for now.  2. Good left ventricular function.  3. Hyperlipidemia.  4. Cervical lumbar degenerative disk disease.  5. Hypertension.   RECOMMENDATIONS:  I think [name] is doing well from cardiac  standpoint.  We will plan and get a fasting lipid profile if he needs  any adjustment to reach target.  We will repeat his blood pressure when  he comes back for that visit.  I will plan to see him on the year  anniversary  of his bypass surgery, which will be in May 2010.  He will follow up  with [name] in the interim.     Michael Elvera Lennox Juanda Chance, MD, [facility]  Electronically Signed    BRB/MedQ  DD: 01/27/2008  DT: 01/28/2008  Job #: [id number]

## 2010-10-16 NOTE — Assessment & Plan Note (Signed)
OFFICE VISIT   [name], [name]  DOB:  07-Jul-1950                                        November 16, 2007  CHART #:  [id number]   HISTORY OF PRESENT ILLNESS:  This patient returns for a routine  followup, status post coronary artery bypass grafting x3 on 10/09/2007,  for severe 2-vessel coronary artery disease.  His postoperative recovery  was notable for the development of severe delirium tremens related to  alcohol withdrawal.  This required intubation with mechanical  ventilation and a prolonged stay in the intensive care unit.  However,  despite this development, he recovered otherwise uneventfully.  Alcohol  withdrawal was treated with a gradual taper of benzodiazepine and,  ultimately, his mental status normalized completely.  He did develop  right-forearm thrombophlebitis related to peripherally inserted central  venous line.  He had transient postoperative atrial fibrillation, which  resolved with medical treatment.  Ultimately, he was discharged home on  10/28/2007.  Since then, he has continued to improve.  He has not yet  been seen in followup by Dr. Juanda Chance and colleagues at the [facility] office.  Medications remain unchanged from the time of hospital  discharge.  Overall, Mr. Robb reports that he is doing fairly well.  He denies any fevers, chills, or productive cough.  He has had minimal  pain in his chest. He has had no shortness of breath.  He has had some  numbness along his surgical incision in the left forearm from radial-  artery harvest.  He also reports persistent numbness in the left fifth  digit and up the ulnar aspect of the forearm suggestive of possible  ulnar neuropathy.  His appetite is good.  His activity level is good,  although he still gets tired with activity; this has been slowly  improving.  The remainder of his review of systems is unremarkable.   PHYSICAL EXAMINATION:  Notable for a well-appearing male with  blood  pressure 114/79, pulse 74, and oxygen saturation 98% on room air.  Median sternotomy incision is healing nicely, and the sternum is stable  on palpation.  The left forearm radial-artery harvest incision has  healed nicely.  The patient does report numbness involving the left  fifth digit and the ulnar aspect of the forearm.  He does not appear to  have any motor deficits.  Extremities are warm and well perfused.  There  is no lower-extremity edema.  The small incision from endoscopic vein  harvest has healed well.   DIAGNOSTIC TESTS:  Chest x-ray obtained today at the [facility] is reviewed.  This demonstrates clear lung fields bilaterally.  There are no pleural effusions.  All the sternal wires appear intact.   IMPRESSION:  Satisfactory progress following recent coronary artery  bypass grafting.  Overall, Mr. Faith seems to be doing well.  He does  have some numbness in the ulnar nerve distribution in the left forearm  that could be related to possible ulnar nerve entrapment.  Hypoperfusion  related to radial-artery harvest seems less likely as the first four  digits are not affected.   PLAN:  I have encouraged Mr. Dosher to continue to gradually increase  his activity as tolerated with his only limitation at this point  remaining that he refrain from heavy lifting or strenuous use of  his  arms and shoulders for at least another 2 months.  I have encouraged him  to get involved in the cardiac rehabilitation program.  I think he can  resume driving an automobile.  I have offered to send him to see Dr.  Amanda Pea in consultation to make sure that ulnar-nerve entrapment is not  suspected.  However, usually these injuries are related to positioning  at the time of surgery and most often will recover with time.  Mr.  Hardgrove does not wish to seek further attention at this time as he is  under the impression that his symptoms have already improved  substantially from  the time of hospital discharge.  He has been  instructed to see Dr. Juanda Chance for further medical followup.  We will plan  to see him back for further followup in 3 months.  While in the hospital  last month Mr. Mcmath was referred for assistance with addressing his  underlying history of alcohol abuse.  All of his questions have been  addressed.   [name]. [name], M.D.  Electronically Signed   CHO/MEDQ  D:  11/16/2007  T:  11/17/2007  Job:  [id number]   cc:   Everardo Beals. Juanda Chance, MD, [facility]  Bruce H. Swords, MD  Learta Codding, MD,FACC

## 2010-10-16 NOTE — Consult Note (Signed)
NAMEDELOYD, HANDY NO.:  [id number]   MEDICAL RECORD NO.:  [id number]          PATIENT TYPE:  INP   LOCATION:  2012                         FACILITY:  [facility]   PHYSICIAN:  Anselm Jungling, MD  DATE OF BIRTH:  December 18, 1950   DATE OF CONSULTATION:  10/27/2007  DATE OF DISCHARGE:                                 CONSULTATION   IDENTIFYING DATA AND REASON FOR REFERRAL:  The patient is a 60 year old  married Caucasian male currently in treatment here at [facility], following a coronary artery bypass graft procedure.  Psychiatric consultation is requested because of the patient's history  of mood disorder.   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  He was admitted on May 8 and subsequently  underwent CABG surgery.  He will be most likely going home sometime in  the next few days.   He has a history of mood disorder, and for several years has been on  Effexor, currently at 75 mg b.i.d., prescribed by his primary care  physician, Dr. Cato Mulligan.  There is currently some question as to whether  this is an adequate regimen for him.  Also, there is some concern about  alcohol and opiate use.  The patient also apparently has severe  degenerative spine disease, and has been managing his pain with opiate  analgesics on the prescription basis.   The patient states that currently, he feels that his mood with respect  to his history of depression has been okay.  That is, he does not feel  that he has had any depressive episodes in recent years, and that  Effexor has been basically helpful for him.  However, he notes that he  is feeling a little blue with low energy and some discouragement in  the aftermath of his surgery, even though it has all gone relatively  well.  He has had no thoughts of suicide that he mentions.  His wife,  who is present today for my consultation, indicates that she feels that  he is  generally doing pretty well, but she also refers to him as being a  bit down and blue lately.   We discussed his situation at length.  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  This is a source of  concern as well.   The patient has admitted to typical alcohol consumption of four beers  daily.  He and his wife mentioned that early on in his hospital stay, he  experienced some withdrawal symptoms with respect to alcohol and  opiates, which has now passed.   MENTAL STATUS AND OBSERVATIONS:  The patient is a well-nourished,  normally-developed adult male who I found very enjoyable to speak with.  He appears to be mildly sad, but not really showing any overt evidence  of clinical depression.  His thoughts and speech are normally organized.  He is clearly of above average intelligence and education.  He asked his  wife to remain for the interview.  She is extremely supportive.  There  is nothing to suggest any cognitive or thinking disturbance whatsoever.   IMPRESSION:  AXIS I:  Depressive disorder NOS, and adjustment disorder,  secondary to medical condition, with depressed mood.  AXIS II:  Deferred.  AXIS III:  Multiple medical problems.  AXIS IV:  Stressors severe.  AXIS V:  GAF 75.   RECOMMENDATIONS:  I discussed with the patient the possibility of  increasing his Effexor dose.  I indicated to him that it is possible  that doing so would help his mood to some degree, and also that it might  not.  However, I indicated that there was little risk in trying this  approach.  I pointed out that regardless of what is done with his  medication, he is likely to be feeling better a few weeks from now any  way, being that much further beyond his surgery, and being stronger,  with healing and rehab.  Nonetheless,  he seemed to feel that this is  something that he would like to do.  I recommended increasing his daily  dose from 150-225 mg daily.   We also discussed the fact that any over consumption of alcohol, and  greater than necessary use of opiate analgesics is something that is  going to tend to mitigate against his energy level and his mood, and is  best avoided.   We talked about the possibility of follow-up with a psychiatrist on an  outpatient basis.  I indicated that so long as his mood remains at an  acceptable level, that there is probably no absolute need to do this.  However, if after giving the increased Effexor dose 4-6 weeks, he is  experiencing significant problems with depression, motivation, etc.,  that at that point it might be wise to seek psychiatric consultation.  He is aware of Dr. Emerson Monte, who is an excellent psychiatrist in  the area.   I gave the patient and his wife my contact information, and explained  that although I do not have an officer outpatient practice, I would be  glad to hear from him if he can use any further recommendations.   Thank you for involving me in this patient's care.      Anselm Jungling, MD  Electronically Signed     SPB/MEDQ  D:  10/27/2007  T:  10/27/2007  Job:  [id number]

## 2010-10-16 NOTE — Op Note (Signed)
[name], [name]             ACCOUNT NO.:  [id number]   MEDICAL RECORD NO.:  [id number]          PATIENT TYPE:  INP   LOCATION:  2313                         FACILITY:  [facility]   PHYSICIAN:  [name]. [name], M.D. DATE OF BIRTH:  Nov 18, 1950   DATE OF PROCEDURE:  10/09/2007  DATE OF DISCHARGE:                               OPERATIVE REPORT   PREOPERATIVE DIAGNOSIS:  Severe two-vessel coronary artery disease.   POSTOPERATIVE DIAGNOSIS:  Severe two-vessel coronary artery disease.   PROCEDURE:  Median sternotomy for coronary artery bypass grafting x3  (left internal mammary artery to second circumflex marginal branch, left  radial artery to posterior descending coronary artery, saphenous vein  graft to diagonal branch, endoscopic saphenous vein harvest from right  thigh)   SURGEON:  [name]. [name], MD   ASSISTANT:  Sheliah Plane, MD   SECOND ASSISTANT:  Coral Ceo, PA   THIRD ASSISTANT:  Dairl Ponder, MD   ANESTHESIA:  General.   BRIEF CLINICAL NOTE:  The patient is a 60 year old male with history of  hypertension, hyperlipidemia, chronic anxiety, chronic back pain, and  alcohol abuse.  The patient also has family history of coronary artery  disease.  The patient has had recent onset of symptoms of chest pain  which prompted a stress Myoview exam which was abnormal.  Cardiac  catheterization reveals severe two-vessel coronary artery disease with  coronary anatomy unfavorable for percutaneous coronary intervention.  A  full consultation has been dictated previously.  The patient has been  counseled at length regarding the indications, risks, and potential  benefits of surgery.  Alternative treatment strategies have been  discussed.  He understands and accepts all associated risks of surgery  and desires to proceed as described.   OPERATIVE FINDINGS:  1. Normal left ventricular function.  2. Good-quality left internal mammary artery, left radial artery, and  saphenous vein conduit for grafting.  3. Good-quality target vessels for grafting.   OPERATIVE NOTE DETAIL:  The patient is brought to the operating room on  the above-mentioned date and central monitoring was established by the  anesthesia service under the care and direction of Dr. Judie Petit.  Specifically, a Swan-Ganz catheter is placed through the right internal  jugular approach.  A radial arterial line is placed.  Intravenous  antibiotics were administered.  Following induction with general  endotracheal anesthesia, a Foley catheter is placed.  The patient's  chest, abdomen, both groins, and both lower extremities were prepared  and draped in a sterile manner.  Baseline transesophageal echocardiogram  is performed by Dr. Randa Evens.  This confirms the presence of normal left  ventricular function.  There is trivial mitral regurgitation.  No other  abnormalities are noted.   Median sternotomy incision is performed and the left internal mammary  artery is dissected from the chest wall and prepared for bypass  grafting.  The left internal mammary artery is good-quality conduit.  Simultaneously, the left radial artery is harvested through a  longitudinal incision along the volar aspect of the left forearm.  All  intervening side branches of the artery and its associated veins were  divided with the Harmonic scalpel.  The artery is mobilized from just  below the antecubital fossa to just above the wrist.  The distal end of  the artery is transected as is the proximal end and the artery is  flushed with heparinized saline solution and stored in a container of  the patient's blood which was heparinized containing papaverine.  The  patient's forearm is with irrigated saline solution, inspected for  hemostasis, and subsequently closed with running absorbable suture.  Simultaneously, saphenous vein is obtained from the patient's right  thigh using an endoscopic vein harvest technique  through a small  incision made just below the right knee.  The saphenous vein is good-  quality conduit.  After the saphenous vein has been removed, the small  incision in the right lower extremity is closed with running absorbable  suture.  The patient is heparinized systemically and the left internal  mammary artery is transected distally.  It is noted to have good flow.   The pericardium is opened.  The ascending aorta is mildly dilated but  otherwise normal in appearance.  The ascending aorta and the right  atrium were cannulated for cardiopulmonary bypass.  Adequate  heparinization is verified.  Cardiopulmonary bypass is begun and the  surface of the heart is inspected.  Distal target vessels were selected  for coronary bypass grafting.  The terminal branch of the left  circumflex coronary artery are too small for grafting.  A temperature  probe is placed in left ventricular septum and a cardioplegic catheter  is placed in the ascending aorta.   The patient is allowed to cool passively to 32 degrees systemic  temperature.  The aortic cross-clamp is applied and cold blood  cardioplegia is administered initially in an antegrade fashion through  the aortic root.  Iced saline slush is applied for topical hypothermia.  The initial cardioplegic arrest and myocardial cooling is felt to be  excellent.  Repeat doses of cardioplegia are administered intermittently  throughout the cross-clamp portion of the operation through the aortic  root and down the subsequently placed vein graft to maintain left  ventricular septal temperature below 15 degrees centigrade.   The following distal coronary anastomoses are performed:  1. The poster descending coronary artery is grafted with left radial      artery in end-to-side fashion.  This vessel measures 2.0 mm in      diameter and is a good-quality target vessel for grafting.  2. The diagonal branch of the left anterior descending coronary artery       is grafted with a saphenous vein graft in an end-to-side fashion.      This vessel measures 2.0 mm in diameter and is a good-quality      target vessel for grafting.  3. The second circumflex marginal branch is grafted with the left      internal mammary artery in an end-to-side fashion.  This vessel      measures 1.5 mm at the site of the distal bypass and is a good-      quality target vessel for grafting.   The two proximal anastomoses, one vein graft and the radial artery  graft, are each constructed directly to the ascending aorta prior to  removal of the aortic crossclamp.  The aortic cross-clamp is removed  after a total cross-clamp time of 72 minutes.  The heart begins to beat  spontaneously without need for cardioversion.  All proximal and distal  coronary anastomoses are  inspect for hemostasis and appropriate graft  orientation.  Epicardial pacing wire is fixed to the right ventricular  free wall into the right atrial appendage.  The patient is rewarmed to  37 degrees centigrade temperature.  The patient is weaned from  cardiopulmonary bypass without difficulty.  The patient's rhythm at  separation from bypass is normal sinus rhythm.  Atrial pacing is  employed to increase the heart rate.  No inotropic support is required.  Total cardiopulmonary bypass time for the operation is 86 minutes.  Follow-up transesophageal echocardiogram performed by Dr. Randa Evens after  separation from bypass demonstrates normal left ventricular function.   The venous and arterial cannulae are removed uneventfully.  Protamine is  administered to reverse the anticoagulation.  The mediastinum and the  left chest are irrigated with saline solution containing vancomycin.  Meticulous surgical hemostasis is ascertained.  The mediastinum and the  left chest are drained with three chest tubes exited through separate  stab incisions inferiorly.  The pericardium and soft tissues anterior to  the aorta are  reapproximated loosely.  The On-Q continuous pain  management system is utilized to facilitate postoperative pain control.  Two Tennis catheters supplied with the On-Q kit are tunneled into the  deep subcutaneous tissues and positioned just lateral to the lateral  border of the sternum on either side.  Each catheter is flushed with 5  mL of 0.5% bupivacaine solution and ultimately connected to a continuous  infusion pump.  The sternum was closed with double-strength sternal  wire.  The soft tissues anterior to the sternum are closed in multiple  layers and the skin is closed with a running subcuticular skin closure.   The patient tolerated the procedure well and is transported to the  surgical intensive care unit in stable condition.  There are no  intraoperative complications.  All sponge, instrument and needle counts  are verified correct at completion of the operation.      [name]. [name], M.D.  Electronically Signed     CHO/MEDQ  D:  10/09/2007  T:  10/09/2007  Job:  [id number]   cc:   Learta Codding, MD,FACC  Everardo Beals. Juanda Chance, MD, [facility]  Bruce H. Swords, MD

## 2010-10-16 NOTE — Assessment & Plan Note (Signed)
OFFICE VISIT   LOI, RENNAKER  DOB:  Aug 18, 1950                                        Oct 05, 2007  CHART #:  [id number]   [name] returns for follow-up to the office today.  He originally  had been scheduled for surgery on 10/01/07, but his surgery was  postponed due to the development of hyponatremia.  I had a telephone  conversation with him last week, and after considerable discussion, we  elected to postpone his surgery.  Following this Mr. Bialy initially  failed to show up for repeat blood work on Thursday, 10/01/07 is as I  had suggested he do during our telephone conversation.  We tried  numerous times to contact him on Thursday, and eventually someone from  my office was able to reach him late Friday afternoon.  He states that  he had decided to go out of town.  He returned to the office today and  did agree to have some blood work obtained.  We have not received  results from repeat serum chemistries.  Mr. Dahm states that he is  doing fine.  He does admit that he had been drinking a fairly large  amount of beer last week in anticipation of the surgery.  He also has  been using oral Valium to relieve some stress and anxiety.  I have  cautioned Mr. Smail that it can be quite dangerous to mix alcohol with  any type of sedatives, and I have strongly encouraged him to stop  drinking between now and the time of surgery.  We will follow up his  chemistries from today, and tentatively plan to proceed with surgery on  Friday this week if his chemistries have normalized.  If he continues to  have hyponatremia, we will plan to intervene depending upon the apparent  underlying problem.  We have obtained a urine sodium level today as  well.  Mr. Rowe had a list of questions about his surgery that we  discussed at  length.  All his questions have been answered.  We will tentatively plan  to proceed with surgery on Friday, 10/09/07 providing  that his blood  work is normal.   Marilu Favre H. [name], M.D.  Electronically Signed   CHO/MEDQ  D:  10/05/2007  T:  10/05/2007  Job:  [id number]   cc:   Learta Codding, MD,FACC  Valetta Mole. Cato Mulligan, MD  Everardo Beals. Juanda Chance, MD, [facility]

## 2010-10-19 NOTE — Op Note (Signed)
[facility]  Patient:    [name], [name]                      MRN: [id number] Proc. Date: 06/30/00 Attending:  Sharlet Salina T. Hoxworth, M.D.                           Operative Report  PREOPERATIVE DIAGNOSIS:  Right inguinal hernia.  POSTOPERATIVE DIAGNOSIS:  Right inguinal hernia.  OPERATION PERFORMED:  Laparoscopic repair of right inguinal hernia.  SURGEON:  Dr. Johna Sheriff.  ANESTHESIA:  General.  BRIEF HISTORY:  Mr. Reppond is a 60 year old white male who presents with an enlarging symptomatic right inguinal hernia. He has a previous history of a left inguinal hernia repair. On examination, he has a moderate sized reducible right inguinal hernia. Repair on the left feels intact. Options for repair had been discussed with the patient and we elected to proceed with laparoscopic repair of the right side. The nature of the procedure, its indications, and risks of bleeding, intestinal injury, recurrence, and possible need for open procedure were discussed and understood preoperatively. He is now brought to the operating room for this procedure.  DESCRIPTION OF PROCEDURE:  The patient was brought to the operating room, placed in supine position on the operating table and general endotracheal anesthesia was induced. Ancef was given preoperatively. A Foley catheter was placed. The abdomen was sterilely prepped and draped. Local anesthesia was used to infiltrate the trocar sites prior to the incisions. A 1 cm incision was made at the umbilicus dissection carried down to the anterior fascia. This was incised transversely and the medial edge of the right rectus muscle identified and the preperitoneal space bluntly entered. The balloon dissector was then passed easily with its tip to the pubic symphysis. With the camera in place, it was inflated with good deployment of the preperitoneal balloon. This was left in place for there minutes for hemostasis and then  removed and the structural balloon placed and CO2 pressure applied. Laparoscopy showed a good dissection. The pubic symphysis were identified and the pubic ramus cleared down to the iliac vessels. Immediately apparent was a good sized direct hernia with preperitoneal fat adherent to a pseudosac. This was dissected posteriorly away from the floor and the entire floor cleared. Laterally the peritoneum was dissected away from the anterior abdominal wall well out to the anterior superior iliac spine. The base of the peritoneum was then traced medially dissecting the peritoneum posteriorly. Peritoneum was seen crossing cord structures and there was no indirect sac and the peritoneum was dissected well posteriorly away from the cord structures. The cord was skeletonized and a small lipoma dissected posteriorly. After a thorough dissection, a piece of polypropylene mesh was trimmed to 4 x 6 inches tapering slightly laterally. It was placed in the preperitoneal space and unfurled and positioned and then tacked initially to the pubic symphysis and pubic ramus down to the iliac vessels. The mesh was well unfurled laterally and tacked to the anterior abdominal wall near the anterior superior iliac spine at its lateral superior corner. The superior edge of the mesh was then tacked along the anterior abdominal wall avoiding the epigastric vessels. The mesh was tacked medially along the midline. All tacks were placed being able to feel the end of the tack through the abdominal wall. This provided nice wide coverage of the direct and indirect spaces. The operative site was inspected for hemostasis which was complete.  The inferolateral corner of the mesh was held in place with a grasper while CO2 was deflated and trocars removed. The fascia defect at the umbilicus was repaired with a figure-of-eight suture of #0 Vicryl. The skin incisions were closed with interrupted subcuticular 4-0 monocryl  and Dermabond. Sponge, needle and instrument counts were correct. The patient was taken to the recovery room in good condition having tolerated the procedure well. DD:  06/30/00 TD:  06/30/00 Job: [id number]

## 2010-10-23 ENCOUNTER — Encounter: Payer: Self-pay | Admitting: Cardiology

## 2010-10-26 ENCOUNTER — Encounter: Payer: Managed Care, Other (non HMO) | Admitting: Cardiology

## 2010-10-29 ENCOUNTER — Other Ambulatory Visit: Payer: Self-pay | Admitting: Internal Medicine

## 2010-11-01 ENCOUNTER — Other Ambulatory Visit: Payer: Self-pay | Admitting: *Deleted

## 2010-11-01 MED ORDER — METOPROLOL SUCCINATE ER 50 MG PO TB24: 50.0000 mg | ORAL_TABLET | Freq: Every day | ORAL | Status: DC

## 2010-12-07 ENCOUNTER — Other Ambulatory Visit: Payer: Self-pay | Admitting: Internal Medicine

## 2011-01-31 ENCOUNTER — Observation Stay (HOSPITAL_COMMUNITY)
Admission: EM | Admit: 2011-01-31 | Discharge: 2011-02-01 | Disposition: A | Discharge status: Home / Self Care | Payer: Managed Care, Other (non HMO) | Attending: Emergency Medicine | Admitting: Emergency Medicine

## 2011-01-31 ENCOUNTER — Inpatient Hospital Stay (INDEPENDENT_AMBULATORY_CARE_PROVIDER_SITE_OTHER)
Admission: RE | Admit: 2011-01-31 | Discharge: 2011-01-31 | Disposition: A | Discharge status: Home / Self Care | Payer: Managed Care, Other (non HMO) | Source: Ambulatory Visit | Attending: Emergency Medicine | Admitting: Emergency Medicine

## 2011-01-31 DIAGNOSIS — Z8614 Personal history of Methicillin resistant Staphylococcus aureus infection: Secondary | ICD-10-CM | POA: Insufficient documentation

## 2011-01-31 DIAGNOSIS — M549 Dorsalgia, unspecified: Secondary | ICD-10-CM | POA: Insufficient documentation

## 2011-01-31 DIAGNOSIS — G8929 Other chronic pain: Secondary | ICD-10-CM | POA: Insufficient documentation

## 2011-01-31 DIAGNOSIS — M542 Cervicalgia: Secondary | ICD-10-CM | POA: Insufficient documentation

## 2011-01-31 DIAGNOSIS — I10 Essential (primary) hypertension: Secondary | ICD-10-CM | POA: Insufficient documentation

## 2011-01-31 DIAGNOSIS — I4892 Unspecified atrial flutter: Secondary | ICD-10-CM | POA: Insufficient documentation

## 2011-01-31 DIAGNOSIS — L02419 Cutaneous abscess of limb, unspecified: Principal | ICD-10-CM | POA: Insufficient documentation

## 2011-01-31 DIAGNOSIS — F329 Major depressive disorder, single episode, unspecified: Secondary | ICD-10-CM | POA: Insufficient documentation

## 2011-01-31 DIAGNOSIS — L02619 Cutaneous abscess of unspecified foot: Secondary | ICD-10-CM

## 2011-01-31 DIAGNOSIS — F3289 Other specified depressive episodes: Secondary | ICD-10-CM | POA: Insufficient documentation

## 2011-01-31 DIAGNOSIS — E1169 Type 2 diabetes mellitus with other specified complication: Secondary | ICD-10-CM

## 2011-01-31 DIAGNOSIS — L97509 Non-pressure chronic ulcer of other part of unspecified foot with unspecified severity: Secondary | ICD-10-CM

## 2011-01-31 DIAGNOSIS — E119 Type 2 diabetes mellitus without complications: Secondary | ICD-10-CM | POA: Insufficient documentation

## 2011-01-31 DIAGNOSIS — I2510 Atherosclerotic heart disease of native coronary artery without angina pectoris: Secondary | ICD-10-CM | POA: Insufficient documentation

## 2011-01-31 LAB — GLUCOSE, CAPILLARY: Glucose-Capillary: 82 mg/dL (ref 70–99)

## 2011-02-01 DIAGNOSIS — M7989 Other specified soft tissue disorders: Secondary | ICD-10-CM

## 2011-02-01 DIAGNOSIS — M79609 Pain in unspecified limb: Secondary | ICD-10-CM

## 2011-02-01 LAB — CBC
Hemoglobin: 11.5 g/dL — ABNORMAL LOW (ref 13.0–17.0)
MCHC: 35.2 g/dL (ref 30.0–36.0)
RDW: 13.7 % (ref 11.5–15.5)
WBC: 7.3 10*3/uL (ref 4.0–10.5)

## 2011-02-01 LAB — POCT I-STAT, CHEM 8
Creatinine, Ser: 1.3 mg/dL (ref 0.50–1.35)
HCT: 36 % — ABNORMAL LOW (ref 39.0–52.0)
Hemoglobin: 12.2 g/dL — ABNORMAL LOW (ref 13.0–17.0)
Sodium: 126 mEq/L — ABNORMAL LOW (ref 135–145)
TCO2: 26 mmol/L (ref 0–100)

## 2011-02-01 LAB — DIFFERENTIAL
Basophils Absolute: 0.1 10*3/uL (ref 0.0–0.1)
Basophils Relative: 1 % (ref 0–1)
Monocytes Absolute: 0.7 10*3/uL (ref 0.1–1.0)
Neutro Abs: 4.3 10*3/uL (ref 1.7–7.7)
Neutrophils Relative %: 59 % (ref 43–77)

## 2011-02-01 LAB — D-DIMER, QUANTITATIVE: D-Dimer, Quant: 3.14 ug/mL-FEU — ABNORMAL HIGH (ref 0.00–0.48)

## 2011-02-08 ENCOUNTER — Other Ambulatory Visit: Payer: Self-pay | Admitting: Internal Medicine

## 2011-02-20 ENCOUNTER — Telehealth: Payer: Self-pay | Admitting: Cardiology

## 2011-02-20 NOTE — Telephone Encounter (Signed)
Pt calls today b/c he has been under a lot of stress lately with the passing of his step-father. Pt reports low energy lately.  He denies shortness of breath, chest discomfort or angina. He has had pneumonia earlier this year and does not feel this is a problem at this time. Denies fever.  Had taken NTG "years ago, But I do not need any now".  He is followed by his pcp. Offered pt appt for 02/21/11 pt refused and will keep appt on 02/28/11.  Understands to call 911 if symptoms become worse. Mylo Red RN

## 2011-02-20 NOTE — Telephone Encounter (Signed)
Pt as appt 9-27 with dr wall, pt having some heaviness in chest, low energy,  requesting test be done before appt if dr wall will order?

## 2011-02-22 ENCOUNTER — Telehealth: Payer: Self-pay | Admitting: Cardiology

## 2011-02-22 MED ORDER — NITROGLYCERIN 0.4 MG SL SUBL: 0.4000 mg | SUBLINGUAL_TABLET | SUBLINGUAL | Status: DC | PRN

## 2011-02-22 NOTE — Telephone Encounter (Signed)
Pt says he has appt next week He wants to know if he could get some nitroglycerin [facility] [phone]

## 2011-02-22 NOTE — Telephone Encounter (Signed)
I spoke with pt and he feels a lot of this "is stress" Aware prescription for NTG was called in. Mylo Red RN

## 2011-02-25 ENCOUNTER — Telehealth: Payer: Self-pay | Admitting: *Deleted

## 2011-02-25 NOTE — Telephone Encounter (Signed)
Still unable to reach pt.  No show letter sent and procedure for 03-06-11 cancelled

## 2011-02-25 NOTE — Telephone Encounter (Signed)
Attempted to reach pt again.  No answer and not able to leave message.

## 2011-02-25 NOTE — Telephone Encounter (Signed)
Pt was a no show for his previsit at 9:00.  Attempted to reach pt at home- no i.d. On machine, no message left.  Attempted to reach him on his cell/work phone- mailbox is full and unable to leave a message

## 2011-02-26 LAB — URINALYSIS, ROUTINE W REFLEX MICROSCOPIC
Bilirubin Urine: NEGATIVE
Glucose, UA: NEGATIVE
Hgb urine dipstick: NEGATIVE
Protein, ur: NEGATIVE
Specific Gravity, Urine: 1.01

## 2011-02-26 LAB — TYPE AND SCREEN
ABO/RH(D): A POS
Antibody Screen: NEGATIVE

## 2011-02-26 LAB — BLOOD GAS, ARTERIAL
Drawn by: 206361
FIO2: 0.21
O2 Saturation: 95.7
Patient temperature: 98.6
pO2, Arterial: 77.7 — ABNORMAL LOW

## 2011-02-26 LAB — COMPREHENSIVE METABOLIC PANEL
ALT: 44
AST: 50 — ABNORMAL HIGH
Calcium: 9
GFR calc Af Amer: >60
Sodium: 124 — ABNORMAL LOW
Total Protein: 6.6

## 2011-02-26 LAB — PROTIME-INR: Prothrombin Time: 12.6

## 2011-02-26 LAB — CBC
MCHC: 34.5
RDW: 13.9

## 2011-02-26 LAB — HEMOGLOBIN A1C: Hgb A1c MFr Bld: 5.7

## 2011-02-26 LAB — ABO/RH: ABO/RH(D): A POS

## 2011-02-27 LAB — CBC
HCT: 21.9 — ABNORMAL LOW
HCT: 24.8 — ABNORMAL LOW
HCT: 27.6 — ABNORMAL LOW
HCT: 28.1 — ABNORMAL LOW
HCT: 36.4 — ABNORMAL LOW
Hemoglobin: 10.1 — ABNORMAL LOW
Hemoglobin: 7.5 — CL
Hemoglobin: 8 — ABNORMAL LOW
Hemoglobin: 8.4 — ABNORMAL LOW
Hemoglobin: 8.9 — ABNORMAL LOW
MCHC: 33.9
MCHC: 33.9
MCHC: 34.3
MCHC: 34.6
MCHC: 35
MCV: 92.6
MCV: 95.7
MCV: 96.2
MCV: 96.4
Platelets: 386
Platelets: 572 — ABNORMAL HIGH
Platelets: 729 — ABNORMAL HIGH
RBC: 2.33 — ABNORMAL LOW
RBC: 2.47 — ABNORMAL LOW
RBC: 2.61 — ABNORMAL LOW
RBC: 2.69 — ABNORMAL LOW
RBC: 2.89 — ABNORMAL LOW
RBC: 2.94 — ABNORMAL LOW
RBC: 3.77 — ABNORMAL LOW
RDW: 13.8
RDW: 14
RDW: 14.2
RDW: 14.9
WBC: 11.2 — ABNORMAL HIGH
WBC: 6.8
WBC: 8.7
WBC: 9.3

## 2011-02-27 LAB — BASIC METABOLIC PANEL
BUN: 10
BUN: 11
BUN: 19
BUN: 19
CO2: 26
CO2: 27
CO2: 28
CO2: 28
Calcium: 8.4
Calcium: 8.5
Calcium: 8.5
Calcium: 8.7
Calcium: 9.2
Chloride: 100
Chloride: 103
Chloride: 105
Creatinine, Ser: 0.86
Creatinine, Ser: 0.96
Creatinine, Ser: 1.26
Creatinine, Ser: 1.34
GFR calc Af Amer: >60
GFR calc Af Amer: >60
GFR calc Af Amer: >60
GFR calc Af Amer: >60
GFR calc Af Amer: >60
GFR calc non Af Amer: 55 — ABNORMAL LOW
GFR calc non Af Amer: >60
GFR calc non Af Amer: >60
GFR calc non Af Amer: >60
Glucose, Bld: 109 — ABNORMAL HIGH
Glucose, Bld: 129 — ABNORMAL HIGH
Glucose, Bld: 141 — ABNORMAL HIGH
Potassium: 3.8
Potassium: 3.9
Potassium: 4
Potassium: 4
Sodium: 131 — ABNORMAL LOW
Sodium: 136
Sodium: 138
Sodium: 144
Sodium: 145

## 2011-02-27 LAB — URINALYSIS, ROUTINE W REFLEX MICROSCOPIC
Bilirubin Urine: NEGATIVE
Glucose, UA: NEGATIVE
Ketones, ur: NEGATIVE
Leukocytes, UA: NEGATIVE
Nitrite: NEGATIVE
Specific Gravity, Urine: 1.025
pH: 5.5

## 2011-02-27 LAB — CULTURE, BLOOD (ROUTINE X 2)

## 2011-02-27 LAB — CROSSMATCH: Antibody Screen: NEGATIVE

## 2011-02-27 LAB — URINE MICROSCOPIC-ADD ON

## 2011-02-27 LAB — COMPREHENSIVE METABOLIC PANEL
BUN: 17
CO2: 27
Calcium: 8.7
GFR calc non Af Amer: >60
Glucose, Bld: 148 — ABNORMAL HIGH
Total Protein: 6

## 2011-02-27 LAB — TRIGLYCERIDES
Triglycerides: 127
Triglycerides: 141

## 2011-02-27 LAB — CATH TIP CULTURE: Culture: 15

## 2011-02-28 ENCOUNTER — Encounter: Payer: Self-pay | Admitting: Cardiology

## 2011-02-28 ENCOUNTER — Telehealth: Payer: Self-pay | Admitting: Internal Medicine

## 2011-02-28 ENCOUNTER — Ambulatory Visit (INDEPENDENT_AMBULATORY_CARE_PROVIDER_SITE_OTHER): Payer: Managed Care, Other (non HMO) | Admitting: Cardiology

## 2011-02-28 VITALS — BP 126/78 | HR 68 | Ht 73.0 in | Wt 212.0 lb

## 2011-02-28 DIAGNOSIS — R7309 Other abnormal glucose: Secondary | ICD-10-CM

## 2011-02-28 DIAGNOSIS — E785 Hyperlipidemia, unspecified: Secondary | ICD-10-CM

## 2011-02-28 DIAGNOSIS — R7303 Prediabetes: Secondary | ICD-10-CM | POA: Insufficient documentation

## 2011-02-28 DIAGNOSIS — I2510 Atherosclerotic heart disease of native coronary artery without angina pectoris: Secondary | ICD-10-CM

## 2011-02-28 DIAGNOSIS — I10 Essential (primary) hypertension: Secondary | ICD-10-CM

## 2011-02-28 MED ORDER — ATORVASTATIN CALCIUM 20 MG PO TABS: 20.0000 mg | ORAL_TABLET | Freq: Every day | ORAL | Status: DC

## 2011-02-28 NOTE — Telephone Encounter (Signed)
Will you call pt and maybe he would like dr Caryl Never

## 2011-02-28 NOTE — Telephone Encounter (Signed)
Per Dr Reece Packer patient will need to establish with another pcp in this office. Ok to switch?

## 2011-02-28 NOTE — Patient Instructions (Addendum)
Your physician has recommended you make the following change in your medication:  Stop simvastatin Start Atorvastatin   Your physician recommends that you return for lab work April 11, 2011 for fasting cholesterol.   Your physician recommends that you schedule a follow-up appointment in: 1 year with Dr. Daleen Squibb

## 2011-02-28 NOTE — Progress Notes (Signed)
HPI [name] comes in today to establish with me as his cardiologist.  He is a former patient of Dr. Juanda Chance.  He required bypass grafting in 2009. He had postoperative atrial fib.  Compliance seems to be an issue. Lipid panel last year showed total cholesterol over 300. He was supposed to be on Zocor or simvastatin at that time. He's also glucose intolerance and overweight by 15-20 pounds. His last hemoglobin A1c was 6.4. This was in April.  Alcohol maybe issue. He still says he drinks 3 beers several nights a week. He does not smoke.  He denies any angina or ischemic symptoms. St times have a dull ache in the center of his chest times but is at rest. He has no exertional symptoms. There's no symptoms of TIAs or mini strokes. There is no history of carotid disease.  There had been no palpitations or clinical suggestion of recurrent A. Fib.  He denies any bleeding or melena. Past Medical History  Diagnosis Date  . Diabetes mellitus   . Hypertension   . DDD (degenerative disc disease)   . HLD (hyperlipidemia)     Past Surgical History  Procedure Date  . Inguinal hernia repair   . Wrist surgery   . Coronary artery bypass graft     Family History  Problem Relation Age of Onset  . Coronary artery disease      History   Social History  . Marital Status: Married    Spouse Name: N/A    Number of Children: N/A  . Years of Education: N/A   Occupational History  . Not on file.   Social History Main Topics  . Smoking status: Never Smoker   . Smokeless tobacco: Not on file  . Alcohol Use: Yes  . Drug Use: Not on file  . Sexually Active: Not on file   Other Topics Concern  . Not on file   Social History Narrative  . No narrative on file    Allergies  Allergen Reactions  . Compazine   . Prochlorperazine Edisylate     Current Outpatient Prescriptions  Medication Sig Dispense Refill  . ACTOS 15 MG tablet take 1 tablet by mouth once daily - THIS WAS ADDED WHILE IN  HOSPITAL  30 tablet  5  . allopurinol (ZYLOPRIM) 100 MG tablet take 1 tablet by mouth once daily  30 tablet  0  . cyclobenzaprine (FLEXERIL) 10 MG tablet Take 10 mg by mouth 2 (two) times daily as needed.        . diltiazem (CARDIZEM CD) 120 MG 24 hr capsule take 1 capsule by mouth once daily  30 capsule  1  . folic acid (FOLVITE) 1 MG tablet take 1 tablet by mouth once daily  30 tablet  0  . gabapentin (NEURONTIN) 300 MG capsule take 1 capsule by mouth twice a day  60 capsule  5  . Hydrocodone-Acetaminophen (VICODIN HP) 10-660 MG TABS Take by mouth as needed.        Marland Kitchen lisinopril-hydrochlorothiazide (PRINZIDE,ZESTORETIC) 20-12.5 MG per tablet take 2 tablets by mouth once daily  60 tablet  5  . metoprolol (TOPROL-XL) 50 MG 24 hr tablet Take 1 tablet (50 mg total) by mouth daily.  30 tablet  5  . nitroGLYCERIN (NITROSTAT) 0.4 MG SL tablet Place 1 tablet (0.4 mg total) under the tongue every 5 (five) minutes as needed for chest pain.  25 tablet  8  . SUMAtriptan (IMITREX) 20 MG/ACT nasal spray 1 spray by  Nasal route daily as needed.        . venlafaxine (EFFEXOR-XR) 150 MG 24 hr capsule TAKE 1 CAPSULE BY MOUTH TWICE A DAY  60 capsule  5  . atorvastatin (LIPITOR) 20 MG tablet Take 1 tablet (20 mg total) by mouth daily.  30 tablet  11    ROS Negative other than HPI.   PE General Appearance: well developed, well nourished in no acute distress, mildly disheveled HEENT: symmetrical face, PERRLA, good dentition  Neck: no JVD, thyromegaly, or adenopathy, trachea midline Chest: symmetric without deformity Cardiac: PMI non-displaced, RRR, normal S1, S2, no gallop or murmur Lung: clear to ausculation and percussion Vascular:no carotid bruits, distal lower extremity pulses diminished with dependent rubor. Reasonable capillary refill.  Abdominal: nondistended, nontender, good bowel sounds, no HSM, no bruits Extremities: no cyanosis, clubbing or edema, no sign of DVT, no varicosities  Skin: normal color,  no rashes Neuro: alert and oriented x 3, non-focal Pysch: normal affect Filed Vitals:   02/28/11 1053  BP: 126/78  Pulse: 68  Height: 6\' 1"  (1.854 m)  Weight: 212 lb (96.163 kg)    EKG  Labs and Studies Reviewed.   Lab Results  Component Value Date   WBC 7.3 02/01/2011   HGB 12.2* 02/01/2011   HCT 36.0* 02/01/2011   MCV 91.9 02/01/2011   PLT 160 02/01/2011      Chemistry      Component Value Date/Time   NA 126* 02/01/2011 0035   K 4.8 02/01/2011 0035   CL 94* 02/01/2011 0035   CO2 28 09/20/2010 0535   BUN 14 02/01/2011 0035   CREATININE 1.30 02/01/2011 0035      Component Value Date/Time   CALCIUM 8.4 09/20/2010 0535   ALKPHOS 78 09/20/2010 0535   AST 33 09/20/2010 0535   ALT 27 09/20/2010 0535   BILITOT 0.6 09/20/2010 0535       Lab Results  Component Value Date   CHOL 326* 11/21/2009   CHOL 340* 05/21/2007   CHOL 315* 06/16/2006   Lab Results  Component Value Date   HDL 67.40 11/21/2009   HDL 29.5 05/21/2007   HDL 28.4 06/16/2006   No results found for this basename: [facility]   Lab Results  Component Value Date   TRIG 341.0* 11/21/2009   TRIG 135 10/26/2007   TRIG 127 10/23/2007   Lab Results  Component Value Date   CHOLHDL 5 11/21/2009   CHOLHDL 4.6 CALC 05/21/2007   Lab Results  Component Value Date   HGBA1C  Value: 6.4 (NOTE)                                                                       According to the ADA Clinical Practice Recommendations for 2011, when HbA1c is used as a screening test:   >=6.5%   Diagnostic of Diabetes Mellitus           (if abnormal result  is confirmed)  5.7-6.4%   Increased risk of developing Diabetes Mellitus  References:Diagnosis and Classification of Diabetes Mellitus,Diabetes Care,2011,34(Suppl 1):S62-S69 and Standards of Medical Care in         Diabetes - 2011,Diabetes Care,2011,34  (Suppl 1):S11-S61.* 09/19/2010   Lab Results  Component Value Date  ALT 27 09/20/2010   AST 33 09/20/2010   ALKPHOS 78 09/20/2010   BILITOT 0.6  09/20/2010   Lab Results  Component Value Date   TSH 1.23 11/21/2009

## 2011-02-28 NOTE — Assessment & Plan Note (Addendum)
On simvastatin and diltiazem. We'll change to atorvastatin 20 mg p.o. Q.h.s. Would check the lab values in 6 weeks. I am concerned that the alcohol use and noncompliance is a major problem, seeing his numbers so high and with an increase in his transaminases in the past. I will not use a higher dose at this time. Goal LDL less than 100. Strongly encouraged to take his meds.

## 2011-02-28 NOTE — Telephone Encounter (Signed)
yes

## 2011-02-28 NOTE — Assessment & Plan Note (Signed)
Stable. No change in medications. 

## 2011-03-01 NOTE — Telephone Encounter (Signed)
lmoam to return call

## 2011-03-06 ENCOUNTER — Other Ambulatory Visit: Payer: Managed Care, Other (non HMO) | Admitting: Internal Medicine

## 2011-03-06 ENCOUNTER — Other Ambulatory Visit: Payer: Self-pay | Admitting: Internal Medicine

## 2011-03-11 ENCOUNTER — Other Ambulatory Visit: Payer: Self-pay | Admitting: Internal Medicine

## 2011-04-11 ENCOUNTER — Other Ambulatory Visit: Payer: Managed Care, Other (non HMO) | Admitting: *Deleted

## 2011-05-02 ENCOUNTER — Encounter: Payer: Self-pay | Admitting: Internal Medicine

## 2011-05-06 ENCOUNTER — Telehealth: Payer: Self-pay | Admitting: *Deleted

## 2011-05-06 NOTE — Telephone Encounter (Signed)
No Show for previsit.  Left message on home phone for pt to [facility] with propofol and letter sent

## 2011-05-22 ENCOUNTER — Other Ambulatory Visit: Payer: Managed Care, Other (non HMO) | Admitting: *Deleted

## 2011-05-22 ENCOUNTER — Ambulatory Visit: Payer: Managed Care, Other (non HMO) | Admitting: Cardiology

## 2011-05-24 ENCOUNTER — Other Ambulatory Visit: Payer: Managed Care, Other (non HMO) | Admitting: Internal Medicine

## 2011-05-24 ENCOUNTER — Ambulatory Visit: Payer: Managed Care, Other (non HMO) | Admitting: Cardiology

## 2011-06-09 ENCOUNTER — Other Ambulatory Visit: Payer: Self-pay | Admitting: Internal Medicine

## 2011-07-15 ENCOUNTER — Telehealth (HOSPITAL_COMMUNITY): Payer: Self-pay | Admitting: *Deleted

## 2011-07-15 NOTE — ED Notes (Signed)
1051 Pt. called on VM and said he had questions. 1426 I called pt. back and he said he got a cut on his lower leg that is now infected. 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 I asked if he had  PCP. He said he retired. Pt. told him he needs to come in and see the doctor.  They can try and treat as an outpatient.  He needs to tell the doctor about having MRSA and previous tx. They will probably do a culture and put him on antibtiotics. Pt. asked how late we are open. I told him to come as soon as he is able, rather than waiting until closing.  Pt. voiced understanding. Vassie Moselle 07/15/2011

## 2011-07-18 ENCOUNTER — Ambulatory Visit: Payer: Managed Care, Other (non HMO) | Admitting: Cardiology

## 2011-08-03 ENCOUNTER — Other Ambulatory Visit: Payer: Self-pay | Admitting: Internal Medicine

## 2011-09-09 ENCOUNTER — Telehealth: Payer: Self-pay | Admitting: *Deleted

## 2011-09-09 NOTE — Telephone Encounter (Signed)
Returning call to pt as he contacted the [place] office to be seen there today for treatment of wound.  Pt stepped on a nail yesterday and due to convenience (distance) pt preferred to see a physician at [facility].  I advised pt that he was an established pt at [facility] and offered an appt with another physician here since his pcp was not available.  Pt declined.  I advised the pt that he needed to be seen today for his injury and advised if he did not want to be seen at our office he needed to go to an urgent care facility.  Pt advised that he would go to urgent care today to be treated.  I offered pt the opportunity to switch to [place] office and he was unhappy that he could not make a decision to see any physician of his choice when he preferred.  Pt stated he would call back and advised if he wanted to switch practices.

## 2011-09-10 ENCOUNTER — Ambulatory Visit (INDEPENDENT_AMBULATORY_CARE_PROVIDER_SITE_OTHER): Payer: Managed Care, Other (non HMO) | Admitting: Family Medicine

## 2011-09-10 ENCOUNTER — Encounter: Payer: Self-pay | Admitting: Family Medicine

## 2011-09-10 VITALS — BP 160/100 | Temp 97.9°F | Wt 199.0 lb

## 2011-09-10 DIAGNOSIS — R7309 Other abnormal glucose: Secondary | ICD-10-CM

## 2011-09-10 DIAGNOSIS — R7303 Prediabetes: Secondary | ICD-10-CM

## 2011-09-10 DIAGNOSIS — F1010 Alcohol abuse, uncomplicated: Secondary | ICD-10-CM

## 2011-09-10 DIAGNOSIS — S91309A Unspecified open wound, unspecified foot, initial encounter: Secondary | ICD-10-CM

## 2011-09-10 LAB — GLUCOSE, POCT (MANUAL RESULT ENTRY): POC Glucose: 99

## 2011-09-10 MED ORDER — CEPHALEXIN 500 MG PO CAPS: 500.0000 mg | ORAL_CAPSULE | Freq: Four times a day (QID) | ORAL | Status: DC

## 2011-09-10 MED ORDER — CEPHALEXIN 500 MG PO CAPS: 500.0000 mg | ORAL_CAPSULE | Freq: Four times a day (QID) | ORAL | Status: AC

## 2011-09-10 NOTE — Patient Instructions (Signed)
Take 2 of the Keflex tablets twice daily  Elevates your foot,,,,,,,,,, towel around u  Foot,,,,,,,,,,,, and put a heating pad low  heat   Return in the morning to see Dr. Kathie Rhodes.  for followup

## 2011-09-10 NOTE — Progress Notes (Signed)
  Subjective:    Patient ID: [name], male    DOB: 04-Jul-1950, 61 y.o.   MRN: [id number]  HPI  [name] is a 61 year old male patient of Dr. 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  He states he has a puncture wound on his foot 3 days ago with a nail. Now his foot is swollen and red. He is a history of diabetes but does not know what his blood sugar is. He says he checked a month ago was 90. He also has a history of coronary disease hypertension hyperlipidemia alcoholism.  It's difficult to get a coherent history he jumps from one topic to the other  Review of Systems    general and dermatologic review of systems otherwise negative Objective:   Physical Exam  Thin male no acute distress examination of the right foot shows a healing puncture wound there is some slight erythema and edema no true cellulitis that I can appreciate pulses are palpable but diminished.   The toes show evidence of fungal infection between the toes and the toenails and the second toe has an abrasion  Examination of the left foot shows no difference in terms of swelling or erythema          Assessment & Plan:  Puncture wound with superficial cellulitis right foot plan elevation heat antibiotics followup with Dr. Cleotis Nipper in the morning  At the end of the exam I explained to him what I wanted him to do he then wanted to know if I could give him some vancomycin and some pain medication. I explained to him that I did not think vancomycin was indicated at this juncture and that the pain medication decision would be by Dr. Cleotis Nipper tomorrow.  Then he pulls out of a list of questions he wants to discuss. I asked him to discuss these questions with Dr. Cato Mulligan tomorrow!!!!!!!!!!!!!!!!

## 2011-09-13 ENCOUNTER — Telehealth: Payer: Self-pay | Admitting: *Deleted

## 2011-09-13 MED ORDER — DOXYCYCLINE HYCLATE 100 MG PO TABS: 100.0000 mg | ORAL_TABLET | Freq: Two times a day (BID) | ORAL | Status: AC

## 2011-09-13 NOTE — Telephone Encounter (Signed)
What are the questions?

## 2011-09-13 NOTE — Telephone Encounter (Signed)
Per Dr. Artist Pais:  Give pt Doxycycline 100 mg one po bid x 10 days.  Pt is aware.

## 2011-09-13 NOTE — Telephone Encounter (Signed)
Pt called back again and said that Dr Tawanna Cooler gave pt some cephALEXin (KEFLEX) 500 MG capsule but is is causing pt to have diarrhea. Pt req a different [facility]x [facility] [phone]

## 2011-09-13 NOTE — Telephone Encounter (Signed)
Pt has questions to ask either Fleet Contras or Dr. Tawanna Cooler about his medication.

## 2011-09-13 NOTE — Telephone Encounter (Signed)
He needs an antibiotic that will hopefully not give him diarrhea.

## 2011-09-20 ENCOUNTER — Other Ambulatory Visit: Payer: Self-pay | Admitting: Internal Medicine

## 2011-09-20 DIAGNOSIS — E785 Hyperlipidemia, unspecified: Secondary | ICD-10-CM

## 2011-09-20 NOTE — Telephone Encounter (Signed)
Pt called again, and needs these filled for 90 days at [facility].

## 2011-09-20 NOTE — Telephone Encounter (Signed)
Pt needs lisinopril-hctz #90,atorvastatin 20 mg,metoprolol 50 mg,venlafaxine 150 mg,actos 15 mg andgabapentin 300mg   [phone]. Pt needs 90 day supply on each medication. Pt is going out of town this weekend

## 2011-09-20 NOTE — Telephone Encounter (Signed)
Per Dr Cato Mulligan, he is discharging pt from the practice for being noncompliant and if he needs refills he needs to go to the urgent care.  Pt has not been seen since 2010 by Dr Cato Mulligan and he came in recently to see Dr Tawanna Cooler but he left without making a follow up appt and without getting labs done.  Per Dr Cato Mulligan its illegal for him to refill his meds cause he has not been seen in so long.  Left message for pt to call back

## 2011-09-23 ENCOUNTER — Encounter: Payer: Self-pay | Admitting: *Deleted

## 2011-10-03 ENCOUNTER — Telehealth: Payer: Self-pay | Admitting: *Deleted

## 2011-10-03 DIAGNOSIS — E785 Hyperlipidemia, unspecified: Secondary | ICD-10-CM

## 2011-10-03 NOTE — Telephone Encounter (Signed)
Pt has received discharge letter and is calling to see if Dr. Cato Mulligan would refill a 90 day supply of all meds until he can establish with another physician.  Pt also states is "sorry" and respects Dr. Cato Mulligan highly and wants to know if he would consider taking him back as a patient.

## 2011-10-04 ENCOUNTER — Telehealth: Payer: Self-pay | Admitting: Family Medicine

## 2011-10-04 NOTE — Telephone Encounter (Signed)
Pulled from Triage vmail. He is inquiring about refills again.

## 2011-10-06 NOTE — Telephone Encounter (Signed)
See what meds he is on.  I'll refill the meds that are not scheduled

## 2011-10-07 MED ORDER — NITROGLYCERIN 0.4 MG SL SUBL: 0.4000 mg | SUBLINGUAL_TABLET | SUBLINGUAL | Status: DC | PRN

## 2011-10-07 MED ORDER — ATORVASTATIN CALCIUM 20 MG PO TABS: 20.0000 mg | ORAL_TABLET | Freq: Every day | ORAL | Status: DC

## 2011-10-07 MED ORDER — PIOGLITAZONE HCL 15 MG PO TABS: 15.0000 mg | ORAL_TABLET | Freq: Every day | ORAL | Status: DC

## 2011-10-07 MED ORDER — CYCLOBENZAPRINE HCL 10 MG PO TABS: 10.0000 mg | ORAL_TABLET | Freq: Two times a day (BID) | ORAL | Status: DC | PRN

## 2011-10-07 MED ORDER — METOPROLOL SUCCINATE ER 50 MG PO TB24: 50.0000 mg | ORAL_TABLET | Freq: Every day | ORAL | Status: DC

## 2011-10-07 MED ORDER — GABAPENTIN 300 MG PO CAPS: 300.0000 mg | ORAL_CAPSULE | Freq: Two times a day (BID) | ORAL | Status: DC

## 2011-10-07 MED ORDER — DILTIAZEM HCL ER COATED BEADS 120 MG PO CP24: 120.0000 mg | ORAL_CAPSULE | Freq: Every day | ORAL | Status: DC

## 2011-10-07 NOTE — Telephone Encounter (Signed)
lipitor 20mg  flexiril 10mg  diltiazem120mg  gabapentin 300mg  metoprolol 50mg , nitroglycerin .4mg  actos 15mg     Pharmacy - [facility]

## 2011-10-07 NOTE — Telephone Encounter (Signed)
Sent rx's in electronically to [facility], Left message for pt to call back

## 2011-10-07 NOTE — Telephone Encounter (Signed)
duplicate

## 2011-10-11 ENCOUNTER — Other Ambulatory Visit: Payer: Self-pay | Admitting: Internal Medicine

## 2011-10-12 ENCOUNTER — Other Ambulatory Visit: Payer: Self-pay | Admitting: Internal Medicine

## 2011-10-14 ENCOUNTER — Telehealth: Payer: Self-pay | Admitting: Family Medicine

## 2011-10-14 MED ORDER — VENLAFAXINE HCL ER 150 MG PO CP24: 150.0000 mg | ORAL_CAPSULE | Freq: Every day | ORAL | Status: DC

## 2011-10-14 NOTE — Telephone Encounter (Signed)
Pulled from Triage vmail. States we refilled several of his meds, but missed the Effexor. Requesting that a 90-days supply of Effexor, 150mg  be sent in. Thanks.

## 2011-10-14 NOTE — Telephone Encounter (Signed)
Per Dr Cato Mulligan can only do 30 day supply, pt was d/c from practice, rx sent in electronically

## 2011-10-17 ENCOUNTER — Other Ambulatory Visit: Payer: Self-pay | Admitting: Internal Medicine

## 2011-11-29 ENCOUNTER — Telehealth: Payer: Self-pay | Admitting: *Deleted

## 2011-11-29 NOTE — Telephone Encounter (Signed)
Call back to patient. Left a message with my direct number to call me back.  I spoke earlier about his discharge from the [facility] practice. He is needing refills of his medication. The discharge letter was sent 09/23/11 and he acknowledged receipt of on 5/3 (documented in phone note dated 5/2). Explained to him that we could not refill his medication and that he could not schedule an appointment with another [facility] primary care. The patient returned my call and was informed that we would not be able to to refill his medication due to his discharge.  Patient is not pleased with the decision . Advised that he would need to be seen at an Urgent Care until he could establish with another Primary Care physician.

## 2011-12-03 ENCOUNTER — Telehealth: Payer: Self-pay | Admitting: Internal Medicine

## 2011-12-03 NOTE — Telephone Encounter (Signed)
Message copied by Lindley Magnus on Tue Dec 03, 2011  3:52 AM ------      Message from: Kathleen Lime      Created: Fri Nov 29, 2011  4:22 PM      Contact: Leonette Most / Patient       Patient: [name]. Callback: [phone]. Franchot called today stating he received letter that he is being released from this office. Gennie needs his heart medications refilled for at least a 30-day supply until he can find another PCP. Please call patient and advise as to what he can do to get his medications refilled temporarily.

## 2011-12-03 NOTE — Telephone Encounter (Signed)
Letter was mailed 09/23/11 and we refilled meds for 30 days on 10/07/11.  Tried to call pt to inform but there was no answer

## 2011-12-03 NOTE — Telephone Encounter (Signed)
Ok to refill meds for 30 days from time letter was sent

## 2011-12-04 ENCOUNTER — Emergency Department (INDEPENDENT_AMBULATORY_CARE_PROVIDER_SITE_OTHER)
Admission: EM | Admit: 2011-12-04 | Discharge: 2011-12-04 | Disposition: A | Discharge status: Home / Self Care | Payer: Managed Care, Other (non HMO) | Source: Home / Self Care | Attending: Emergency Medicine | Admitting: Emergency Medicine

## 2011-12-04 ENCOUNTER — Encounter (HOSPITAL_COMMUNITY): Payer: Self-pay | Admitting: *Deleted

## 2011-12-04 DIAGNOSIS — I10 Essential (primary) hypertension: Secondary | ICD-10-CM

## 2011-12-04 DIAGNOSIS — I2510 Atherosclerotic heart disease of native coronary artery without angina pectoris: Secondary | ICD-10-CM

## 2011-12-04 DIAGNOSIS — E785 Hyperlipidemia, unspecified: Secondary | ICD-10-CM

## 2011-12-04 DIAGNOSIS — R7303 Prediabetes: Secondary | ICD-10-CM

## 2011-12-04 DIAGNOSIS — R7309 Other abnormal glucose: Secondary | ICD-10-CM

## 2011-12-04 LAB — POCT I-STAT, CHEM 8
Calcium, Ion: 1.21 mmol/L (ref 1.12–1.32)
Creatinine, Ser: 1 mg/dL (ref 0.50–1.35)
Glucose, Bld: 120 mg/dL — ABNORMAL HIGH (ref 70–99)
Hemoglobin: 17 g/dL (ref 13.0–17.0)
Potassium: 4.2 mEq/L (ref 3.5–5.1)

## 2011-12-04 MED ORDER — GABAPENTIN 300 MG PO CAPS: 300.0000 mg | ORAL_CAPSULE | Freq: Two times a day (BID) | ORAL | Status: DC | PRN

## 2011-12-04 MED ORDER — ATORVASTATIN CALCIUM 10 MG PO TABS: 10.0000 mg | ORAL_TABLET | Freq: Every day | ORAL | Status: DC

## 2011-12-04 MED ORDER — METOPROLOL SUCCINATE ER 50 MG PO TB24: 50.0000 mg | ORAL_TABLET | Freq: Every day | ORAL | Status: DC

## 2011-12-04 MED ORDER — LISINOPRIL 20 MG PO TABS: 20.0000 mg | ORAL_TABLET | Freq: Every day | ORAL | Status: DC

## 2011-12-04 MED ORDER — ALLOPURINOL 100 MG PO TABS: 100.0000 mg | ORAL_TABLET | Freq: Every day | ORAL | Status: DC

## 2011-12-04 MED ORDER — PIOGLITAZONE HCL 15 MG PO TABS: 15.0000 mg | ORAL_TABLET | Freq: Every day | ORAL | Status: DC

## 2011-12-04 MED ORDER — DILTIAZEM HCL ER COATED BEADS 120 MG PO CP24: 120.0000 mg | ORAL_CAPSULE | Freq: Every day | ORAL | Status: DC

## 2011-12-04 MED ORDER — VENLAFAXINE HCL ER 150 MG PO CP24: 150.0000 mg | ORAL_CAPSULE | Freq: Every day | ORAL | Status: DC

## 2011-12-04 NOTE — ED Notes (Signed)
Pt   Ran out  Of  His  meds         - He  Was dismissed    By  His  pcp  And  Is  Looking  For  A  Doctor     He denys  Any  Other  Symptoms

## 2011-12-04 NOTE — ED Provider Notes (Signed)
Chief Complaint  Patient presents with  . Medication Refill    History of Present Illness:  [name] is a 61 year old male with hypertension, hyperlipidemia, diabetes, chronic neck and lower back pain, coronary disease, history of tachycardia and atrial fibrillation, and gout who comes in today for a refill on his meds. He was discharged by his primary care physician, Dr. Birdie Sons. He needs refills on all his meds. Right now he is asymptomatic with regard to his heart or lungs and denies any shortness of breath, coughing, wheezing, chest pain, tightness, pressure, palpitations, dizziness, syncope, or ankle edema. He hasn't had any polyuria, polydipsia, or blurry vision. He's been off all of his meds for over a month. He has not had any gout attacks.  Review of Systems:  Other than noted above, the patient denies any of the following symptoms: Systemic:  No fever, chills, fatigue, weight loss or gain. Respiratory:  No coughing, wheezing, or shortness of breath. Cardiac:  No chest pain, tightness, pressure, palpitations, syncope, or edema. Neuro:  No headache, dizziness, blurred vision, weakness, paresthesias, or strokelike symptoms.  PMFSH:  Past medical history, family history, social history, meds, and allergies were reviewed.  Physical Exam:   Vital signs:  BP 142/105  Pulse 140  Temp 98.3 F (36.8 C) (Oral)  Resp 18  SpO2 98% General:  Alert, oriented, in no distress. Lungs:  Breath sounds clear and equal bilaterally.  No wheezes, rales, or rhonchi. Heart:  Regular rhythm, no gallops, murmers, clicks or rubs.  Abdomen:  Soft and flat.  Nontender, no organomegaly or mass.  No pulsatile midline abdominal mass or bruit. Ext:  No edema, pulses full.  Labs:   Results for orders placed during the hospital encounter of 12/04/11  POCT I-STAT, CHEM 8      Component Value Range   Sodium 137  135 - 145 mEq/L   Potassium 4.2  3.5 - 5.1 mEq/L   Chloride 100  96 - 112 mEq/L   BUN 6  6 - 23  mg/dL   Creatinine, Ser 1.61  0.50 - 1.35 mg/dL   Glucose, Bld 096 (*) 70 - 99 mg/dL   Calcium, Ion 0.45  4.09 - 1.32 mmol/L   TCO2 26  0 - 100 mmol/L   Hemoglobin 17.0  13.0 - 17.0 g/dL   HCT 81.1  91.4 - 78.2 %     Assessment:  The primary encounter diagnosis was Coronary artery disease. Diagnoses of Glucose intolerance (pre-diabetes), HYPERLIPIDEMIA, and HYPERTENSION were also pertinent to this visit.  Plan:   1.  The following meds were prescribed:   New Prescriptions   ALLOPURINOL (ZYLOPRIM) 100 MG TABLET    Take 1 tablet (100 mg total) by mouth daily.   ATORVASTATIN (LIPITOR) 10 MG TABLET    Take 1 tablet (10 mg total) by mouth daily.   DILTIAZEM (CARDIZEM CD) 120 MG 24 HR CAPSULE    Take 1 capsule (120 mg total) by mouth daily.   GABAPENTIN (NEURONTIN) 300 MG CAPSULE    Take 1 capsule (300 mg total) by mouth 2 (two) times daily as needed.   LISINOPRIL (PRINIVIL,ZESTRIL) 20 MG TABLET    Take 1 tablet (20 mg total) by mouth daily.   METOPROLOL SUCCINATE (TOPROL XL) 50 MG 24 HR TABLET    Take 1 tablet (50 mg total) by mouth daily. Take with or immediately following a meal.   PIOGLITAZONE (ACTOS) 15 MG TABLET    Take 1 tablet (15 mg total) by mouth  daily.   VENLAFAXINE XR (EFFEXOR-XR) 150 MG 24 HR CAPSULE    Take 1 capsule (150 mg total) by mouth daily.   2.  The patient was instructed in symptomatic care and handouts were given. 3.  The patient was told to return if becoming worse in any way, if no better in 3 or 4 days, and given some red flag symptoms that would indicate earlier return. I suggested he find a primary care physician as soon as possible. He has good insurance and I gave him some names of some doctors that he might look into.    Reuben Likes, MD 12/04/11 1630

## 2011-12-09 NOTE — Telephone Encounter (Signed)
No more refills.  Have him go to an urgent care center

## 2012-07-25 ENCOUNTER — Emergency Department (HOSPITAL_COMMUNITY)
Admission: EM | Admit: 2012-07-25 | Discharge: 2012-07-25 | Disposition: A | Discharge status: Home / Self Care | Payer: Managed Care, Other (non HMO) | Attending: Emergency Medicine | Admitting: Emergency Medicine

## 2012-07-25 ENCOUNTER — Encounter (HOSPITAL_COMMUNITY): Payer: Self-pay | Admitting: Emergency Medicine

## 2012-07-25 DIAGNOSIS — Z7982 Long term (current) use of aspirin: Secondary | ICD-10-CM | POA: Insufficient documentation

## 2012-07-25 DIAGNOSIS — I10 Essential (primary) hypertension: Secondary | ICD-10-CM | POA: Insufficient documentation

## 2012-07-25 DIAGNOSIS — Z79899 Other long term (current) drug therapy: Secondary | ICD-10-CM | POA: Insufficient documentation

## 2012-07-25 DIAGNOSIS — L02419 Cutaneous abscess of limb, unspecified: Secondary | ICD-10-CM | POA: Insufficient documentation

## 2012-07-25 DIAGNOSIS — IMO0002 Reserved for concepts with insufficient information to code with codable children: Secondary | ICD-10-CM | POA: Insufficient documentation

## 2012-07-25 DIAGNOSIS — Z87828 Personal history of other (healed) physical injury and trauma: Secondary | ICD-10-CM | POA: Insufficient documentation

## 2012-07-25 DIAGNOSIS — Z86718 Personal history of other venous thrombosis and embolism: Secondary | ICD-10-CM | POA: Insufficient documentation

## 2012-07-25 DIAGNOSIS — L0390 Cellulitis, unspecified: Secondary | ICD-10-CM

## 2012-07-25 DIAGNOSIS — Z9889 Other specified postprocedural states: Secondary | ICD-10-CM | POA: Insufficient documentation

## 2012-07-25 DIAGNOSIS — M7989 Other specified soft tissue disorders: Secondary | ICD-10-CM | POA: Insufficient documentation

## 2012-07-25 DIAGNOSIS — E119 Type 2 diabetes mellitus without complications: Secondary | ICD-10-CM | POA: Insufficient documentation

## 2012-07-25 DIAGNOSIS — Z951 Presence of aortocoronary bypass graft: Secondary | ICD-10-CM | POA: Insufficient documentation

## 2012-07-25 DIAGNOSIS — E785 Hyperlipidemia, unspecified: Secondary | ICD-10-CM | POA: Insufficient documentation

## 2012-07-25 HISTORY — DX: Presence of aortocoronary bypass graft: Z95.1

## 2012-07-25 LAB — BASIC METABOLIC PANEL
BUN: 7 mg/dL (ref 6–23)
Calcium: 8.8 mg/dL (ref 8.4–10.5)
GFR calc non Af Amer: >90 mL/min (ref >90)
Glucose, Bld: 130 mg/dL — ABNORMAL HIGH (ref 70–99)
Sodium: 131 mEq/L — ABNORMAL LOW (ref 135–145)

## 2012-07-25 LAB — CBC
HCT: 41.8 % (ref 39.0–52.0)
Hemoglobin: 14.7 g/dL (ref 13.0–17.0)
MCH: 32.6 pg (ref 26.0–34.0)
MCHC: 35.2 g/dL (ref 30.0–36.0)

## 2012-07-25 MED ORDER — AMOXICILLIN-POT CLAVULANATE 875-125 MG PO TABS: 1.0000 | ORAL_TABLET | Freq: Two times a day (BID) | ORAL | Status: DC

## 2012-07-25 MED ORDER — CEFAZOLIN SODIUM 1-5 GM-% IV SOLN
1.0000 g | Freq: Once | INTRAVENOUS | Status: AC
  Administered 2012-07-25: 1 g via INTRAVENOUS
  Filled 2012-07-25: qty 50

## 2012-07-25 NOTE — ED Notes (Signed)
NAD noted voices no complaints at this time. Wife at the bedside

## 2012-07-25 NOTE — ED Notes (Signed)
Rt leg swelling  X 4 days  Had an injury that healed over 6 months  Ago  Leg is red

## 2012-07-25 NOTE — ED Notes (Signed)
Candy, Vascular Tech here to perform doppler

## 2012-07-25 NOTE — Progress Notes (Signed)
VASCULAR LAB PRELIMINARY  PRELIMINARY  PRELIMINARY  PRELIMINARY  Right lower extremity venous Dopplers completed.    Preliminary report:  There is no DVT or SVT noted in the right lower extremity.  Raechel Marcos, RVT 07/25/2012, 3:19 PM

## 2012-07-25 NOTE — ED Notes (Signed)
Pt brought to room from triage; pt getting undressed at this time; family at bedside

## 2012-07-25 NOTE — ED Provider Notes (Signed)
History    CSN: [id number] Arrival date & time 07/25/12  1254 First MD Initiated Contact with Patient 07/25/12 1321     Chief complaint: Leg swelling and redness  HPI The patient presents to the emergency room with complaints of erythema and swelling of his right lower leg. The symptoms started about 4 or 5 days ago. They have gradually progressed. Patient states also mildly tender. He denies any recent injuries however he has had a significant leg injury greater than 6 months ago where his lower extremity was injured in an escalator accident. In extensive soft tissue repair of his lower extremity. Wound healed up about 6 months ago. Patient also has had history of DVT many years ago in his other lower extremity. He denies any chest pain or shortness of breath. He denies any fevers or chills or other complaints. Past Medical History  Diagnosis Date  . Diabetes mellitus   . Hypertension   . DDD (degenerative disc disease)   . HLD (hyperlipidemia)   . S/P CABG x 1     Past Surgical History  Procedure Laterality Date  . Inguinal hernia repair    . Wrist surgery    . Coronary artery bypass graft      Family History  Problem Relation Age of Onset  . Coronary artery disease      History  Substance Use Topics  . Smoking status: Never Smoker   . Smokeless tobacco: Not on file  . Alcohol Use: Yes      Review of Systems  All other systems reviewed and are negative.    Allergies  Compazine  Home Medications   Current Outpatient Rx  Name  Route  Sig  Dispense  Refill  . allopurinol (ZYLOPRIM) 100 MG tablet   Oral   Take 100 mg by mouth as needed (for gout).         Marland Kitchen aspirin 325 MG tablet   Oral   Take 325 mg by mouth daily.         Marland Kitchen atorvastatin (LIPITOR) 10 MG tablet   Oral   Take 1 tablet (10 mg total) by mouth daily.   90 tablet   0   . diltiazem (CARDIZEM CD) 120 MG 24 hr capsule   Oral   Take 1 capsule (120 mg total) by mouth daily.   90 capsule    0   . gabapentin (NEURONTIN) 300 MG capsule   Oral   Take 300 mg by mouth as needed (for pain).         . Hydrocodone-Acetaminophen (VICODIN HP) 10-660 MG TABS   Oral   Take 1 tablet by mouth as needed (for pain).          Marland Kitchen lisinopril (PRINIVIL,ZESTRIL) 20 MG tablet   Oral   Take 1 tablet (20 mg total) by mouth daily.   90 tablet   0   . metoprolol succinate (TOPROL XL) 50 MG 24 hr tablet   Oral   Take 1 tablet (50 mg total) by mouth daily. Take with or immediately following a meal.   90 tablet   0   . venlafaxine XR (EFFEXOR-XR) 150 MG 24 hr capsule   Oral   Take 1 capsule (150 mg total) by mouth daily.   30 capsule   0   . nitroGLYCERIN (NITROSTAT) 0.4 MG SL tablet   Sublingual   Place 1 tablet (0.4 mg total) under the tongue every 5 (five) minutes as needed for chest pain.  25 tablet   0     BP 124/80  Pulse 94  Temp(Src) 98.2 F (36.8 C)  Resp 20  SpO2 96%  Physical Exam  Nursing note and vitals reviewed. Constitutional: He appears well-developed and well-nourished. No distress.  HENT:  Head: Normocephalic and atraumatic.  Right Ear: External ear normal.  Left Ear: External ear normal.  Eyes: Conjunctivae are normal. Right eye exhibits no discharge. Left eye exhibits no discharge. No scleral icterus.  Neck: Neck supple. No tracheal deviation present.  Cardiovascular: Normal rate, regular rhythm and intact distal pulses.   Pulmonary/Chest: Effort normal and breath sounds normal. No stridor. No respiratory distress. He has no wheezes. He has no rales.  Abdominal: Soft. Bowel sounds are normal. He exhibits no distension. There is no tenderness. There is no rebound and no guarding.  Musculoskeletal: He exhibits edema and tenderness.  Well-healed scar with deformity right lower extremity, erythema extending from foot to the knee, tenderness palpation, no palpable cords, mild edema compared to the left lower extremity  Neurological: He is alert. He has  normal strength. No sensory deficit. Cranial nerve deficit:  no gross defecits noted. He exhibits normal muscle tone. He displays no seizure activity. Coordination normal.  Skin: Skin is warm and dry. No rash noted.  Psychiatric: He has a normal mood and affect.    ED Course  Procedures (including critical care time) Preliminary results of the venous Doppler: No evidence of DVT Labs Reviewed  BASIC METABOLIC PANEL - Abnormal; Notable for the following:    Sodium 131 (*)    Chloride 94 (*)    Glucose, Bld 130 (*)    All other components within normal limits  CBC  PROTIME-INR  APTT   No results found.   1. Cellulitis       MDM  The patient's erythema and swelling is most likely related to a cellulitis. There is no evidence of deep venous thrombosis on the Doppler study. The patient is otherwise well appearing he is nontoxic. I will give him a dose of IV antibiotics and have him followup with his primary Dr. this week to make sure that the infection resolves the        Celene Kras, MD 07/25/12 1549

## 2012-07-25 NOTE — ED Notes (Signed)
Pt was given a cup of ice water per Lynelle Doctor, MD

## 2012-07-25 NOTE — ED Notes (Signed)
Patient up to the restroom.

## 2012-07-25 NOTE — ED Notes (Signed)
Pt placed on continuous pulse oximetry and blood pressure cuff; wife at bedside

## 2012-12-01 ENCOUNTER — Telehealth: Payer: Self-pay | Admitting: Cardiology

## 2012-12-01 NOTE — Telephone Encounter (Signed)
New problem  Lelon Mast is calling regarding a medical clearance form they faxed over for this patient.  She said the patient is to have surgery on Monday.

## 2012-12-02 NOTE — Telephone Encounter (Signed)
I spoke with [name] briefly & he was busy asked me to call back regarding eyelid surgery.  I have called [name] back (4:26pm).  He states his blood pressure has been normal "for the most part but sometimes it is out of normal" When asked [name] states blood pressure has been 120's/80-90's  States he is asymptomatic no blurred vision, headaches, or dizziness  [name] denies any angina or chest pain.  [name] feels he will call Dr. Vella Raring office & cancel the bilateral eyelid surgery & follow-up with Herma Carson Pa on 12/23/12 regarding his fluctuating blood pressure & surgical clearance at that time Mylo Red RN

## 2012-12-03 NOTE — Telephone Encounter (Signed)
Samantha at Dr. Vella Raring office is aware pt has appt on 12/23/12 regarding his blood pressure & surgical clearance. Pt stated yesterday he would call their office & cancel his surgery for Monday 12/07/12.   Surgery cancelled. Mylo Red RN

## 2012-12-23 ENCOUNTER — Ambulatory Visit: Payer: Managed Care, Other (non HMO) | Admitting: Physician Assistant

## 2013-01-26 ENCOUNTER — Encounter: Payer: Self-pay | Admitting: Internal Medicine

## 2013-02-03 ENCOUNTER — Ambulatory Visit: Payer: Managed Care, Other (non HMO) | Admitting: Nurse Practitioner

## 2013-02-18 ENCOUNTER — Telehealth: Payer: Self-pay | Admitting: *Deleted

## 2013-02-18 NOTE — Telephone Encounter (Signed)
lmovm for pt to call back & schedule appointment for surgical clearance. Pt has not been seen by Dr. Daleen Squibb in 2 years. See past appointments Mylo Red RN

## 2013-02-22 NOTE — Telephone Encounter (Signed)
Pt is unaware of appt --LMTCB at both numbers to let pt know appt day and time.

## 2013-02-22 NOTE — Telephone Encounter (Signed)
Appt scheduled with Norma Fredrickson, NP 03/01/13 11:45AM.

## 2013-02-23 NOTE — Telephone Encounter (Signed)
LM on patient's home number with appointment information and that we called to verify patient aware.  Person that answered cell number states wrong number - that number does not belong to the the patient. I called numbers listed for patient's spouse and left message

## 2013-02-23 NOTE — Telephone Encounter (Signed)
Received call from patient's wife Rosland she stated husband will not be able to keep appointment with Norma Fredrickson NP 03/01/13 he has to go to [facility] to be with his sick mother.Schedulers will call patient back to reschedule appointment.

## 2013-03-01 ENCOUNTER — Ambulatory Visit: Payer: Managed Care, Other (non HMO) | Admitting: Nurse Practitioner

## 2013-03-02 ENCOUNTER — Telehealth: Payer: Self-pay | Admitting: *Deleted

## 2013-03-02 NOTE — Telephone Encounter (Signed)
I called pt today and left a voice mail for a call back to reschedule his appointment.

## 2013-03-10 ENCOUNTER — Encounter: Payer: Self-pay | Admitting: *Deleted

## 2013-03-10 NOTE — Telephone Encounter (Signed)
I have tried to reach [name] on since 03/02/13. Patient has not returned any of my calls. I will forward message to [facility]. Maybe [name] will have better luck.

## 2013-04-15 NOTE — Telephone Encounter (Signed)
After receiving request for surgical clearance scheduled on 11/17 with Dr. Lynnell Grain, I called and spoke with Lelon Mast in their office. Lelon Mast is aware pt has cancelled appointments for surgical clearance & has not returned any of our calls. She states she has not been able to get in touch with him either. She will try again today & let him know he still needs to schedule an appointment for surgical clearance. Mylo Red RN

## 2013-09-30 ENCOUNTER — Other Ambulatory Visit: Payer: Self-pay | Admitting: *Deleted

## 2013-09-30 ENCOUNTER — Other Ambulatory Visit: Payer: Self-pay

## 2013-09-30 ENCOUNTER — Telehealth: Payer: Self-pay | Admitting: Cardiology

## 2013-09-30 DIAGNOSIS — E785 Hyperlipidemia, unspecified: Secondary | ICD-10-CM

## 2013-09-30 DIAGNOSIS — I2510 Atherosclerotic heart disease of native coronary artery without angina pectoris: Secondary | ICD-10-CM

## 2013-09-30 DIAGNOSIS — I10 Essential (primary) hypertension: Secondary | ICD-10-CM

## 2013-09-30 MED ORDER — NITROGLYCERIN 0.4 MG SL SUBL: 0.4000 mg | SUBLINGUAL_TABLET | SUBLINGUAL | Status: DC | PRN

## 2013-09-30 MED ORDER — ATORVASTATIN CALCIUM 20 MG PO TABS: 20.0000 mg | ORAL_TABLET | Freq: Every day | ORAL | Status: DC

## 2013-09-30 MED ORDER — DILTIAZEM HCL ER COATED BEADS 120 MG PO CP24: 120.0000 mg | ORAL_CAPSULE | Freq: Every day | ORAL | Status: DC

## 2013-09-30 NOTE — Telephone Encounter (Signed)
**Note De-Identified [name] Obfuscation** I left a detailed message on the pts VM stating that since he has not been seen in office since 02/2011 [name] also wants him to have a CBCD and CMET drawn in addition to the lipids and LFT's that the pt has requested. All labs have been ordered and an appt to have labs drawn is scheduled on 10/26/13, the pt is aware.

## 2013-09-30 NOTE — Telephone Encounter (Signed)
New Message:  Pt called to set up his recall appt. Pt needs liver and lipid orders.

## 2013-10-26 ENCOUNTER — Other Ambulatory Visit: Payer: Managed Care, Other (non HMO)

## 2013-10-28 ENCOUNTER — Encounter: Payer: Self-pay | Admitting: *Deleted

## 2013-11-01 ENCOUNTER — Ambulatory Visit (INDEPENDENT_AMBULATORY_CARE_PROVIDER_SITE_OTHER): Payer: Managed Care, Other (non HMO) | Admitting: Cardiology

## 2013-11-01 ENCOUNTER — Encounter: Payer: Self-pay | Admitting: Cardiology

## 2013-11-01 VITALS — BP 136/92 | HR 145 | Ht 73.0 in | Wt 200.0 lb

## 2013-11-01 DIAGNOSIS — I4891 Unspecified atrial fibrillation: Secondary | ICD-10-CM

## 2013-11-01 DIAGNOSIS — E785 Hyperlipidemia, unspecified: Secondary | ICD-10-CM

## 2013-11-01 DIAGNOSIS — I10 Essential (primary) hypertension: Secondary | ICD-10-CM

## 2013-11-01 DIAGNOSIS — I2510 Atherosclerotic heart disease of native coronary artery without angina pectoris: Secondary | ICD-10-CM

## 2013-11-01 DIAGNOSIS — I480 Paroxysmal atrial fibrillation: Secondary | ICD-10-CM

## 2013-11-01 LAB — CBC WITH DIFFERENTIAL/PLATELET
Basophils Absolute: 0.1 10*3/uL (ref 0.0–0.1)
Basophils Relative: 1 % (ref 0–1)
Eosinophils Absolute: 0 10*3/uL (ref 0.0–0.7)
Eosinophils Relative: 0 % (ref 0–5)
HCT: 47.9 % (ref 39.0–52.0)
Hemoglobin: 16.2 g/dL (ref 13.0–17.0)
Lymphocytes Relative: 15 % (ref 12–46)
Lymphs Abs: 1 10*3/uL (ref 0.7–4.0)
MCH: 31.8 pg (ref 26.0–34.0)
MCHC: 33.8 g/dL (ref 30.0–36.0)
MCV: 93.9 fL (ref 78.0–100.0)
Monocytes Absolute: 0.8 10*3/uL (ref 0.1–1.0)
Monocytes Relative: 12 % (ref 3–12)
Neutro Abs: 5 10*3/uL (ref 1.7–7.7)
Neutrophils Relative %: 72 % (ref 43–77)
Platelets: 298 10*3/uL (ref 150–400)
RBC: 5.1 MIL/uL (ref 4.22–5.81)
RDW: 13.5 % (ref 11.5–15.5)
WBC: 6.9 10*3/uL (ref 4.0–10.5)

## 2013-11-01 LAB — COMPREHENSIVE METABOLIC PANEL
ALT: 28 U/L (ref 0–53)
AST: 19 U/L (ref 0–37)
Albumin: 4.3 g/dL (ref 3.5–5.2)
Alkaline Phosphatase: 77 U/L (ref 39–117)
BUN: 21 mg/dL (ref 6–23)
CO2: 22 mEq/L (ref 19–32)
Calcium: 9.5 mg/dL (ref 8.4–10.5)
Chloride: 93 mEq/L — ABNORMAL LOW (ref 96–112)
Creat: 0.89 mg/dL (ref 0.50–1.35)
Glucose, Bld: 473 mg/dL — ABNORMAL HIGH (ref 70–99)
Potassium: 4.4 mEq/L (ref 3.5–5.3)
Sodium: 126 mEq/L — ABNORMAL LOW (ref 135–145)
Total Bilirubin: 0.4 mg/dL (ref 0.2–1.2)
Total Protein: 7.5 g/dL (ref 6.0–8.3)

## 2013-11-01 LAB — PROTIME-INR
INR: 0.97 (ref <1.50)
Prothrombin Time: 12.8 seconds (ref 11.6–15.2)

## 2013-11-01 NOTE — Patient Instructions (Addendum)
START XARELTO 20 MG DAILY  Your physician has requested that you have a TEE/Cardioversion. During a TEE, sound waves are used to create images of your heart. It provides your doctor with information about the size and shape of your heart and how well your heart's chambers and valves are working. In this test, a transducer is attached to the end of a flexible tube that is guided down you throat and into your esophagus (the tube leading from your mouth to your stomach) to get a more detailed image of your heart. Once the TEE has determined that a blood clot is not present, the cardioversion begins. Electrical Cardioversion uses a jolt of electricity to your heart either through paddles or wired patches attached to your chest. This is a controlled, usually prescheduled, procedure. This procedure is done at the hospital and you are not awake during the procedure. You usually go home the day of the procedure. Please see the instruction sheet given to you today for more information. WILL CALL YOU TOMORROW WITH DATE AND TIME

## 2013-11-01 NOTE — Progress Notes (Signed)
Patient ID: [name], male   DOB: Oct 04, 1950, 63 y.o.   MRN: [id number]    Patient Name: [name] Date of Encounter: 11/01/2013  Primary Care Provider:  Default, Provider, MD Primary Cardiologist:  Dorothy Spark [facility] Dr. Verl Blalock)  Problem List   Past Medical History  Diagnosis Date  . Diabetes mellitus   . Hypertension   . DDD (degenerative disc disease)   . HLD (hyperlipidemia)   . S/P CABG x 1   . Lumbago   . Gout   . Neuropathy   . Sciatica    Past Surgical History  Procedure Laterality Date  . Inguinal hernia repair Left   . Wrist surgery    . Coronary artery bypass graft  5.8.09    x3  . Laparoscopic inguinal hernia repair Right   . Cardiac catheterization  4.17.09  . Tonsillectomy and adenoidectomy      Allergies  Allergies  Allergen Reactions  . Compazine Other (See Comments)    A lockjaw-type reaction--face locked up     HPI  Mr. [name] comes in today to establish with me as his cardiologist. He is a former patient of Dr. Olevia Perches and Dr Verl Blalock.  He required bypass grafting in 2009. He had postoperative atrial fib.  Compliance seems to be an issue. Lipid panel last year showed total cholesterol over 300. He was supposed to be on Zocor or simvastatin at that time. He's also glucose intolerance and overweight by 15-20 pounds. His last hemoglobin A1c was 6.4. This was in April.  Alcohol maybe issue. He still says he drinks 3 beers several nights a week. He does not smoke.  He has been living in [place] taking care of his parents in [place] for the last couple of years and has been doing okay. He denies any chest pain. However he has noticed worsening dyspnea on exertion and fatigue. No palpitations or syncope. He states he has been compliant with his medications. No orthopnea or lower extremity edema.   Home Medications  Prior to Admission medications   Medication Sig Start Date End Date Taking? Authorizing Provider  allopurinol  (ZYLOPRIM) 100 MG tablet Take 100 mg by mouth as needed (for gout).   Yes Historical Provider, MD  aspirin 325 MG tablet Take 325 mg by mouth daily.   Yes Historical Provider, MD  atorvastatin (LIPITOR) 20 MG tablet Take 1 tablet (20 mg total) by mouth daily. 09/30/13  Yes Dorothy Spark, MD  diltiazem (CARDIZEM CD) 120 MG 24 hr capsule Take 1 capsule (120 mg total) by mouth daily. 09/30/13 09/30/14 Yes Dorothy Spark, MD  gabapentin (NEURONTIN) 300 MG capsule Take 300 mg by mouth as needed (for pain).   Yes Historical Provider, MD  Hydrocodone-Acetaminophen (VICODIN HP) 10-660 MG TABS Take 1 tablet by mouth as needed (for pain).    Yes Historical Provider, MD  lisinopril (PRINIVIL,ZESTRIL) 20 MG tablet Take 1 tablet (20 mg total) by mouth daily. 12/04/11  Yes Harden Mo, MD  metoprolol succinate (TOPROL XL) 50 MG 24 hr tablet Take 1 tablet (50 mg total) by mouth daily. Take with or immediately following a meal. 12/04/11  Yes Harden Mo, MD  nitroGLYCERIN (NITROSTAT) 0.4 MG SL tablet Place 1 tablet (0.4 mg total) under the tongue every 5 (five) minutes as needed for chest pain. 09/30/13 09/30/14 Yes Dorothy Spark, MD    Family History  Family History  Problem Relation Age of Onset  . Coronary artery disease  family history  . Heart disease Father     CABG   . Heart attack Father   . Stroke      family history    Social History  History   Social History  . Marital Status: Married    Spouse Name: N/A    Number of Children: N/A  . Years of Education: N/A   Occupational History  . Not on file.   Social History Main Topics  . Smoking status: Never Smoker   . Smokeless tobacco: Not on file  . Alcohol Use: Yes  . Drug Use: Not on file  . Sexual Activity: Not on file   Other Topics Concern  . Not on file   Social History Narrative  . No narrative on file     Review of Systems, as per HPI, otherwise negative General:  No chills, fever, night sweats or weight  changes.  Cardiovascular:  No chest pain, dyspnea on exertion, edema, orthopnea, palpitations, paroxysmal nocturnal dyspnea. Dermatological: No rash, lesions/masses Respiratory: No cough, dyspnea Urologic: No hematuria, dysuria Abdominal:   No nausea, vomiting, diarrhea, bright red blood per rectum, melena, or hematemesis Neurologic:  No visual changes, wkns, changes in mental status. All other systems reviewed and are otherwise negative except as noted above.  Physical Exam  Blood pressure 136/92, pulse 145, height 6\' 1"  (1.854 m), weight 200 lb (90.719 kg).  General: Pleasant, NAD Psych: Normal affect. Neuro: Alert and oriented X 3. Moves all extremities spontaneously. HEENT: Normal  Neck: Supple without bruits or JVD. Lungs:  Resp regular and unlabored, CTA. Heart: tachycardic, no s3, s4, or murmurs. Abdomen: Soft, non-tender, non-distended, BS + x 4.  Extremities: No clubbing, cyanosis or edema. DP/PT/Radials 2+ and equal bilaterally.  Labs:  No results found for this basename: CKTOTAL, CKMB, TROPONINI,  in the last 72 hours Lab Results  Component Value Date   WBC 5.5 07/25/2012   HGB 14.7 07/25/2012   HCT 41.8 07/25/2012   MCV 92.7 07/25/2012   PLT 221 07/25/2012    Lab Results  Component Value Date   DDIMER 3.14* 02/01/2011   No components found with this basename: POCBNP,     Component Value Date/Time   NA 131* 07/25/2012 1339   K 3.8 07/25/2012 1339   CL 94* 07/25/2012 1339   CO2 26 07/25/2012 1339   GLUCOSE 130* 07/25/2012 1339   GLUCOSE 144* 06/16/2006 1036   BUN 7 07/25/2012 1339   CREATININE 0.59 07/25/2012 1339   CALCIUM 8.8 07/25/2012 1339   PROT 6.4 09/20/2010 0535   ALBUMIN 3.3* 09/20/2010 0535   AST 33 09/20/2010 0535   ALT 27 09/20/2010 0535   ALKPHOS 78 09/20/2010 0535   BILITOT 0.6 09/20/2010 0535   GFRNONAA >90 07/25/2012 1339   GFRAA >90 07/25/2012 1339   Lab Results  Component Value Date   CHOL 326* 11/21/2009   HDL 67.40 11/21/2009   TRIG 341.0* 11/21/2009      Accessory Clinical Findings  Echocardiogram - none available  ECG - atrial flutter with 2:1 block    Assessment & Plan  63 year old male  1. atrial flutter with 2: 1 block, history of paroxysmal atrial fibrillation, we'll start patient on full anticoagulation with Xarelto 20 mg daily starting today. We'll schedule patient for TEE and cardioversion on Wednesday to insert 2015. We'll continue diltiazem and metoprolol for now. Left ventricular ejection fraction will be evaluated by TEE as well as any possible valvular abnormalities. We will check comprehensive metabolic profile,  CBC, INR today.  2. history of CAD and coronary artery bypass surgery in 2009, the patient denies any chest pain for now we'll continue aspirin, atorvastatin, metoprolol.  3. Hyperlipidemia - we'll check lipid profile and liver enzymes.  Followup in 3 weeks post cardioversion.    Dorothy Spark, MD, [facility] 11/01/2013, 4:51 PM

## 2013-11-02 ENCOUNTER — Encounter: Payer: Self-pay | Admitting: *Deleted

## 2013-11-02 ENCOUNTER — Telehealth: Payer: Self-pay | Admitting: *Deleted

## 2013-11-02 NOTE — Telephone Encounter (Signed)
Pt contacted about upcoming procedure cardioversion and TEE scheduled with Dr Meda Coffee for November 03 2013 at 12pm.  Gave pt thorough instructions over the phone and typed up instructions, and gave references about aflutter, cardioversion, and tee. Pt will pick this information up today before 5 pm.  Instructed pt to stop taking his aspirin now and indefinitely until further advised and start taking his xarelto 20 mg.  Pt verbalized understanding of procedure time, where to go, and what time he needs to arrive, and med instructions given. Diet also discussed.  Hospital orders placed in by Dr Meda Coffee.  Pt very appreciative of all the assistance provided.

## 2013-11-03 ENCOUNTER — Encounter (HOSPITAL_COMMUNITY): Payer: Self-pay

## 2013-11-03 ENCOUNTER — Encounter (HOSPITAL_COMMUNITY)
Admission: RE | Disposition: A | Discharge status: Home / Self Care | Payer: Self-pay | Source: Ambulatory Visit | Attending: Cardiology

## 2013-11-03 ENCOUNTER — Ambulatory Visit (HOSPITAL_COMMUNITY): Payer: Managed Care, Other (non HMO) | Admitting: Anesthesiology

## 2013-11-03 ENCOUNTER — Ambulatory Visit (HOSPITAL_COMMUNITY)
Admission: RE | Admit: 2013-11-03 | Discharge: 2013-11-03 | Disposition: A | Discharge status: Home / Self Care | Payer: Managed Care, Other (non HMO) | Source: Ambulatory Visit | Attending: Cardiology | Admitting: Cardiology

## 2013-11-03 ENCOUNTER — Encounter (HOSPITAL_COMMUNITY): Payer: Managed Care, Other (non HMO) | Admitting: Anesthesiology

## 2013-11-03 DIAGNOSIS — Z951 Presence of aortocoronary bypass graft: Secondary | ICD-10-CM | POA: Insufficient documentation

## 2013-11-03 DIAGNOSIS — E785 Hyperlipidemia, unspecified: Secondary | ICD-10-CM | POA: Insufficient documentation

## 2013-11-03 DIAGNOSIS — E663 Overweight: Secondary | ICD-10-CM | POA: Insufficient documentation

## 2013-11-03 DIAGNOSIS — G589 Mononeuropathy, unspecified: Secondary | ICD-10-CM | POA: Insufficient documentation

## 2013-11-03 DIAGNOSIS — Z79899 Other long term (current) drug therapy: Secondary | ICD-10-CM | POA: Insufficient documentation

## 2013-11-03 DIAGNOSIS — I10 Essential (primary) hypertension: Secondary | ICD-10-CM | POA: Insufficient documentation

## 2013-11-03 DIAGNOSIS — I4892 Unspecified atrial flutter: Secondary | ICD-10-CM | POA: Insufficient documentation

## 2013-11-03 DIAGNOSIS — I2510 Atherosclerotic heart disease of native coronary artery without angina pectoris: Secondary | ICD-10-CM | POA: Insufficient documentation

## 2013-11-03 DIAGNOSIS — I369 Nonrheumatic tricuspid valve disorder, unspecified: Secondary | ICD-10-CM

## 2013-11-03 DIAGNOSIS — IMO0002 Reserved for concepts with insufficient information to code with codable children: Secondary | ICD-10-CM | POA: Insufficient documentation

## 2013-11-03 DIAGNOSIS — Z888 Allergy status to other drugs, medicaments and biological substances status: Secondary | ICD-10-CM | POA: Insufficient documentation

## 2013-11-03 DIAGNOSIS — M109 Gout, unspecified: Secondary | ICD-10-CM | POA: Insufficient documentation

## 2013-11-03 DIAGNOSIS — Z7982 Long term (current) use of aspirin: Secondary | ICD-10-CM | POA: Insufficient documentation

## 2013-11-03 DIAGNOSIS — E119 Type 2 diabetes mellitus without complications: Secondary | ICD-10-CM | POA: Insufficient documentation

## 2013-11-03 HISTORY — PX: CARDIOVERSION: SHX1299

## 2013-11-03 HISTORY — PX: TEE WITHOUT CARDIOVERSION: SHX5443

## 2013-11-03 LAB — GLUCOSE, CAPILLARY: Glucose-Capillary: 209 mg/dL — ABNORMAL HIGH (ref 70–99)

## 2013-11-03 SURGERY — ECHOCARDIOGRAM, TRANSESOPHAGEAL
Anesthesia: Monitor Anesthesia Care

## 2013-11-03 MED ORDER — MIDAZOLAM HCL 5 MG/5ML IJ SOLN
INTRAMUSCULAR | Status: DC | PRN
  Administered 2013-11-03: 2 mg via INTRAVENOUS
  Administered 2013-11-03 (×2): 1 mg via INTRAVENOUS

## 2013-11-03 MED ORDER — SODIUM CHLORIDE 0.9 % IV SOLN
INTRAVENOUS | Status: DC
  Administered 2013-11-03: 12:00:00 via INTRAVENOUS

## 2013-11-03 MED ORDER — PROPOFOL 10 MG/ML IV BOLUS
INTRAVENOUS | Status: DC | PRN
  Administered 2013-11-03 (×3): 20 mg via INTRAVENOUS

## 2013-11-03 MED ORDER — PROPOFOL INFUSION 10 MG/ML OPTIME
INTRAVENOUS | Status: DC | PRN
  Administered 2013-11-03: 100 ug/kg/min via INTRAVENOUS

## 2013-11-03 MED ORDER — SODIUM CHLORIDE 0.9 % IV SOLN
INTRAVENOUS | Status: DC | PRN
  Administered 2013-11-03: 12:00:00 via INTRAVENOUS

## 2013-11-03 MED ORDER — HYDRALAZINE HCL 20 MG/ML IJ SOLN
INTRAMUSCULAR | Status: AC
  Filled 2013-11-03: qty 1

## 2013-11-03 MED ORDER — FENTANYL CITRATE 0.05 MG/ML IJ SOLN
INTRAMUSCULAR | Status: DC | PRN
  Administered 2013-11-03 (×5): 50 ug via INTRAVENOUS

## 2013-11-03 NOTE — Progress Notes (Signed)
Echocardiogram Echocardiogram Transesophageal has been performed.  Ines Bloomer 11/03/2013, 1:07 PM

## 2013-11-03 NOTE — H&P (View-Only) (Signed)
Patient ID: [name], male   DOB: 12/15/1950, 63 y.o.   MRN: [id number]    Patient Name: [name] Date of Encounter: 11/01/2013  Primary Care Provider:  Default, Provider, MD Primary Cardiologist:  Daquane Aguilar H Rocco Kerkhoff (previosuly Dr. Wall)  Problem List   Past Medical History  Diagnosis Date  . Diabetes mellitus   . Hypertension   . DDD (degenerative disc disease)   . HLD (hyperlipidemia)   . S/P CABG x 1   . Lumbago   . Gout   . Neuropathy   . Sciatica    Past Surgical History  Procedure Laterality Date  . Inguinal hernia repair Left   . Wrist surgery    . Coronary artery bypass graft  5.8.09    x3  . Laparoscopic inguinal hernia repair Right   . Cardiac catheterization  4.17.09  . Tonsillectomy and adenoidectomy      Allergies  Allergies  Allergen Reactions  . Compazine Other (See Comments)    A lockjaw-type reaction--face locked up     HPI  [name] comes in today to establish with me as his cardiologist. He is a former patient of Dr. Brodie and Dr Wall.  He required bypass grafting in 2009. He had postoperative atrial fib.  Compliance seems to be an issue. Lipid panel last year showed total cholesterol over 300. He was supposed to be on Zocor or simvastatin at that time. He's also glucose intolerance and overweight by 15-20 pounds. His last hemoglobin A1c was 6.4. This was in April.  Alcohol maybe issue. He still says he drinks 3 beers several nights a week. He does not smoke.  He has been living in [place] of [place] in [place] for the last couple of years and has been doing okay. He denies any chest pain. However he has noticed worsening dyspnea on exertion and fatigue. No palpitations or syncope. He states he has been compliant with his medications. No orthopnea or lower extremity edema.   Home Medications  Prior to Admission medications   Medication Sig Start Date End Date Taking? Authorizing Provider  allopurinol  (ZYLOPRIM) 100 MG tablet Take 100 mg by mouth as needed (for gout).   Yes Historical Provider, MD  aspirin 325 MG tablet Take 325 mg by mouth daily.   Yes Historical Provider, MD  atorvastatin (LIPITOR) 20 MG tablet Take 1 tablet (20 mg total) by mouth daily. 09/30/13  Yes Latron Ribas H Sharyn Brilliant, MD  diltiazem (CARDIZEM CD) 120 MG 24 hr capsule Take 1 capsule (120 mg total) by mouth daily. 09/30/13 09/30/14 Yes Gustavo Meditz H Aara Jacquot, MD  gabapentin (NEURONTIN) 300 MG capsule Take 300 mg by mouth as needed (for pain).   Yes Historical Provider, MD  Hydrocodone-Acetaminophen (VICODIN HP) 10-660 MG TABS Take 1 tablet by mouth as needed (for pain).    Yes Historical Provider, MD  lisinopril (PRINIVIL,ZESTRIL) 20 MG tablet Take 1 tablet (20 mg total) by mouth daily. 12/04/11  Yes David C Keller, MD  metoprolol succinate (TOPROL XL) 50 MG 24 hr tablet Take 1 tablet (50 mg total) by mouth daily. Take with or immediately following a meal. 12/04/11  Yes David C Keller, MD  nitroGLYCERIN (NITROSTAT) 0.4 MG SL tablet Place 1 tablet (0.4 mg total) under the tongue every 5 (five) minutes as needed for chest pain. 09/30/13 09/30/14 Yes Anothony Bursch H Liviana Mills, MD    Family History  Family History  Problem Relation Age of Onset  . Coronary artery disease        family history  . Heart disease Father     CABG   . Heart attack Father   . Stroke      family history    Social History  History   Social History  . Marital Status: Married    Spouse Name: N/A    Number of Children: N/A  . Years of Education: N/A   Occupational History  . Not on file.   Social History Main Topics  . Smoking status: Never Smoker   . Smokeless tobacco: Not on file  . Alcohol Use: Yes  . Drug Use: Not on file  . Sexual Activity: Not on file   Other Topics Concern  . Not on file   Social History Narrative  . No narrative on file     Review of Systems, as per HPI, otherwise negative General:  No chills, fever, night sweats or weight  changes.  Cardiovascular:  No chest pain, dyspnea on exertion, edema, orthopnea, palpitations, paroxysmal nocturnal dyspnea. Dermatological: No rash, lesions/masses Respiratory: No cough, dyspnea Urologic: No hematuria, dysuria Abdominal:   No nausea, vomiting, diarrhea, bright red blood per rectum, melena, or hematemesis Neurologic:  No visual changes, wkns, changes in mental status. All other systems reviewed and are otherwise negative except as noted above.  Physical Exam  Blood pressure 136/92, pulse 145, height 6' 1" (1.854 m), weight 200 lb (90.719 kg).  General: Pleasant, NAD Psych: Normal affect. Neuro: Alert and oriented X 3. Moves all extremities spontaneously. HEENT: Normal  Neck: Supple without bruits or JVD. Lungs:  Resp regular and unlabored, CTA. Heart: tachycardic, no s3, s4, or murmurs. Abdomen: Soft, non-tender, non-distended, BS + x 4.  Extremities: No clubbing, cyanosis or edema. DP/PT/Radials 2+ and equal bilaterally.  Labs:  No results found for this basename: CKTOTAL, CKMB, TROPONINI,  in the last 72 hours Lab Results  Component Value Date   WBC 5.5 07/25/2012   HGB 14.7 07/25/2012   HCT 41.8 07/25/2012   MCV 92.7 07/25/2012   PLT 221 07/25/2012    Lab Results  Component Value Date   DDIMER 3.14* 02/01/2011   No components found with this basename: POCBNP,     Component Value Date/Time   NA 131* 07/25/2012 1339   K 3.8 07/25/2012 1339   CL 94* 07/25/2012 1339   CO2 26 07/25/2012 1339   GLUCOSE 130* 07/25/2012 1339   GLUCOSE 144* 06/16/2006 1036   BUN 7 07/25/2012 1339   CREATININE 0.59 07/25/2012 1339   CALCIUM 8.8 07/25/2012 1339   PROT 6.4 09/20/2010 0535   ALBUMIN 3.3* 09/20/2010 0535   AST 33 09/20/2010 0535   ALT 27 09/20/2010 0535   ALKPHOS 78 09/20/2010 0535   BILITOT 0.6 09/20/2010 0535   GFRNONAA >90 07/25/2012 1339   GFRAA >90 07/25/2012 1339   Lab Results  Component Value Date   CHOL 326* 11/21/2009   HDL 67.40 11/21/2009   TRIG 341.0* 11/21/2009      Accessory Clinical Findings  Echocardiogram - none available  ECG - atrial flutter with 2:1 block    Assessment & Plan  63-year-old male  1. atrial flutter with 2: 1 block, history of paroxysmal atrial fibrillation, we'll start patient on full anticoagulation with Xarelto 20 mg daily starting today. We'll schedule patient for TEE and cardioversion on Wednesday to insert 2015. We'll continue diltiazem and metoprolol for now. Left ventricular ejection fraction will be evaluated by TEE as well as any possible valvular abnormalities. We will check comprehensive metabolic profile,   CBC, INR today.  2. history of CAD and coronary artery bypass surgery in 2009, the patient denies any chest pain for now we'll continue aspirin, atorvastatin, metoprolol.  3. Hyperlipidemia - we'll check lipid profile and liver enzymes.  Followup in 3 weeks post cardioversion.    Dorothy Spark, MD, [facility] 11/01/2013, 4:51 PM

## 2013-11-03 NOTE — CV Procedure (Signed)
     Transesophageal Echocardiogram Note  [name] [id number] [birth year]  Procedure: Transesophageal Echocardiogram Indications: Atrial flutter  Procedure Details Consent: Obtained Time Out: Verified patient identification, verified procedure, site/side was marked, verified correct patient position, special equipment/implants available, Radiology Safety Procedures followed,  medications/allergies/relevent history reviewed, required imaging and test results available.  Performed  Medications: IV Propofol and iv Versed by anesthesia staff.   No prior echocardiogram to compare.  Left Ventrical:  Hypokinesis of the anteroseptal and anterior walls, overall preserved LVEF 55%.   Mitral Valve: Structurally normal, trace MR.  Aortic Valve: Structurally normal, No AI.  Tricuspid Valve: Normal, mild to moderate TR.   Pulmonic Valve: Normal, no PR.  Left Atrium/ Left atrial appendage: Dilated, appears mildly.  Atrial septum: Normal, no PFO or ASD by color Doppler.  Aorta: Mid atherosclerotic plague.   Complications: No apparent complications Patient did tolerate procedure well.      Cardioversion Note  [name] [id number] 1950-06-13  Procedure: DC Cardioversion Indications: atrial flutter  Procedure Details Consent: Obtained Time Out: Verified patient identification, verified procedure, site/side was marked, verified correct patient position, special equipment/implants available, Radiology Safety Procedures followed,  medications/allergies/relevent history reviewed, required imaging and test results available.  Performed  The patient has been on adequate anticoagulation.  The patient received IV Propofol and versed by anesthesia staff for sedation.  Synchronous cardioversion was performed at 120 joules.  The cardioversion was successful   Complications: No apparent complications Patient did tolerate procedure well.   Dorothy Spark, MD,  [facility] 11/03/2013, 1:36 PM          Dorothy Spark, MD, [facility] 11/03/2013, 6:03 AM

## 2013-11-03 NOTE — Transfer of Care (Signed)
Immediate Anesthesia Transfer of Care Note  Patient: [name]  Procedure(s) Performed: Procedure(s): TRANSESOPHAGEAL ECHOCARDIOGRAM (TEE) (N/A) CARDIOVERSION (N/A)  Patient Location: PACU and Endoscopy Unit  Anesthesia Type:MAC  Level of Consciousness: awake, alert , oriented and sedated  Airway & Oxygen Therapy: Patient Spontanous Breathing and Patient connected to nasal cannula oxygen  Post-op Assessment: Report given to PACU RN, Post -op Vital signs reviewed and stable and Patient moving all extremities  Post vital signs: Reviewed and stable  Complications: No apparent anesthesia complications

## 2013-11-03 NOTE — Discharge Instructions (Signed)
Electrical Cardioversion, Care After °Refer to this sheet in the next few weeks. These instructions provide you with information on caring for yourself after your procedure. Your health care provider may also give you more specific instructions. Your treatment has been planned according to current medical practices, but problems sometimes occur. Call your health care provider if you have any problems or questions after your procedure. °WHAT TO EXPECT AFTER THE PROCEDURE °After your procedure, it is typical to have the following sensations: °· Some redness on the skin where the shocks were delivered. If this is tender, a sunburn lotion or hydrocortisone cream may help. °· Possible return of an abnormal heart rhythm within hours or days after the procedure. °HOME CARE INSTRUCTIONS °· Only take medicine as directed by your health care provider. Be sure you understand how and when to take your medicine. °· Learn how to feel your pulse and check it often. °· Limit your activity for 48 hours after the procedure or as directed. °· Avoid or minimize caffeine and other stimulants as directed. °SEEK MEDICAL CARE IF: °· You feel like your heart is beating too fast or your pulse is not regular. °· You have any questions about your medicines. °· You have bleeding that will not stop. °SEEK IMMEDIATE MEDICAL CARE IF: °· You are dizzy or feel faint. °· It is hard to breathe or you feel short of breath. °· There is a change in discomfort in your chest. °· Your speech is slurred or you have trouble moving an arm or leg on one side of your body. °· You get a serious muscle cramp that does not go away. °· Your fingers or toes turn cold or blue. °MAKE SURE YOU:  °· Understand these instructions.   °· Will watch your condition.   °· Will get help right away if you are not doing well or get worse. °Document Released: 03/10/2013 Document Reviewed: 12/02/2012 °ExitCare® Patient Information ©2014 [employer]. °Transesophageal  Echocardiography °Transesophageal echocardiography (TEE) is a special type of test that produces images of the heart by using sound waves (echocardiogram). This type of echocardiography can obtain better images of the heart than standard echocardiography. TEE is done by passing a flexible tube down the esophagus. The heart is located in front of the esophagus. Because the heart and esophagus are close to one another, your health care provider can take very clear, detailed pictures of the heart via ultrasound waves. °TEE may be done: °· If your health care provider needs more information based on standard echocardiography findings. °· If you had a stroke. This might have happened because a clot formed in your heart. TEE can visualize different areas of the heart and check for clots. °· To check valve anatomy and function. °· To check for infection on the inside of your heart (endocarditis). °· To evaluate the dividing wall (septum) of the heart and presence of a hole that did not close after birth (patent foramen ovale or atrial septal defect). °· To help diagnose a tear in the wall of the aorta (aortic dissection). °· During cardiac valve surgery. This allows the surgeon to assess the valve repair before closing the chest. °· During a variety of other cardiac procedures to guide positioning of catheters. °· Sometimes before a cardioversion, which is a shock to convert heart rhythm back to normal. °LET YOUR HEALTH CARE PROVIDER KNOW ABOUT:  °· Any allergies you have. °· All medicines you are taking, including vitamins, herbs, eye drops, creams, and over-the-counter medicines. °·   Previous problems you or members of your family have had with the use of anesthetics. °· Any blood disorders you have. °· Previous surgeries you have had. °· Medical conditions you have. °· Swallowing difficulties. °· An esophageal obstruction. °RISKS AND COMPLICATIONS  °Generally, TEE is a safe procedure. However, as with any procedure,  complications can occur. Possible complications include an esophageal tear (rupture). °BEFORE THE PROCEDURE  °· Do not eat or drink for 6 hours before the procedure or as directed by your health care provider. °· Arrange for someone to drive you home after the procedure. Do not drive yourself home. During the procedure, you will be given medicines that can continue to make you feel drowsy and can impair your reflexes. °· An IV access tube will be started in the arm. °PROCEDURE  °· A medicine to help you relax (sedative) will be given through the IV access tube. °· A medicine that numbs the area (local anesthetic) may be sprayed in the back of the throat. °· Your blood pressure, heart rate, and breathing (vital signs) will be monitored during the procedure. °· The TEE probe is a long, flexible tube. The tip of the probe is placed into the back of the mouth, and you will be asked to swallow. This helps to pass the tip of the probe into the esophagus. Once the tip of the probe is in the correct area, your health care provider can take pictures of the heart. °· TEE is usually not a painful procedure. You may feel the probe press against the back of the throat. The probe does not enter the trachea and does not affect your breathing. °· Your time spent at the hospital is usually less than 2 hours. °AFTER THE PROCEDURE  °· You will be in bed, resting, until you have fully returned to consciousness. °· When you first awaken, your throat may feel slightly sore and will probably still feel numb. This will improve slowly over time. °· You will not be allowed to eat or drink until it is clear that the numbness has improved. °· Once you have been able to drink, urinate, and sit on the edge of the bed without feeling sick to your stomach (nausea) or dizzy, you may be cleared to go home. °· You should have a friend or family member with you for the next 24 hours after your procedure. °Document Released: 08/10/2002 Document  Revised: 03/10/2013 Document Reviewed: 11/19/2012 °ExitCare® Patient Information ©2014 [employer]. ° °

## 2013-11-03 NOTE — Interval H&P Note (Signed)
History and Physical Interval Note:  11/03/2013 6:02 AM  [name]  has presented today for surgery, with the diagnosis of A FLUTTER   The various methods of treatment have been discussed with the patient and family. After consideration of risks, benefits and other options for treatment, the patient has consented to  Procedure(s): TRANSESOPHAGEAL ECHOCARDIOGRAM (TEE) (N/A) CARDIOVERSION (N/A) as a surgical intervention .  The patient's history has been reviewed, patient examined, no change in status, stable for surgery.  I have reviewed the patient's chart and labs.  Questions were answered to the patient's satisfaction.     Dorothy Spark

## 2013-11-03 NOTE — Anesthesia Preprocedure Evaluation (Signed)
Anesthesia Evaluation  Patient identified by MRN, date of birth, ID band Patient awake    Reviewed: Allergy & Precautions, H&P , NPO status , Patient's Chart, lab work & pertinent test results  Airway Mallampati: II  Neck ROM: Full    Dental   Pulmonary neg pulmonary ROS,  breath sounds clear to auscultation        Cardiovascular hypertension, + CAD and + CABG Rhythm:Irregular Rate:Normal     Neuro/Psych  Neuromuscular disease    GI/Hepatic   Endo/Other    Renal/GU      Musculoskeletal  (+) Arthritis -,   Abdominal   Peds  Hematology   Anesthesia Other Findings   Reproductive/Obstetrics                           Anesthesia Physical Anesthesia Plan  ASA: III  Anesthesia Plan: MAC   Post-op Pain Management:    Induction: Intravenous  Airway Management Planned: Natural Airway and Nasal Cannula  Additional Equipment:   Intra-op Plan:   Post-operative Plan:   Informed Consent: I have reviewed the patients History and Physical, chart, labs and discussed the procedure including the risks, benefits and alternatives for the proposed anesthesia with the patient or authorized representative who has indicated his/her understanding and acceptance.   Dental advisory given  Plan Discussed with: CRNA and Surgeon  Anesthesia Plan Comments:         Anesthesia Quick Evaluation

## 2013-11-04 ENCOUNTER — Encounter (HOSPITAL_COMMUNITY): Payer: Self-pay | Admitting: Cardiology

## 2013-11-05 ENCOUNTER — Other Ambulatory Visit: Payer: Self-pay | Admitting: *Deleted

## 2013-11-05 ENCOUNTER — Telehealth: Payer: Self-pay | Admitting: *Deleted

## 2013-11-05 MED ORDER — METOPROLOL SUCCINATE ER 50 MG PO TB24: 50.0000 mg | ORAL_TABLET | Freq: Every day | ORAL | Status: DC

## 2013-11-05 MED ORDER — ATORVASTATIN CALCIUM 20 MG PO TABS: 20.0000 mg | ORAL_TABLET | Freq: Every day | ORAL | Status: DC

## 2013-11-05 MED ORDER — DILTIAZEM HCL ER COATED BEADS 120 MG PO CP24: 120.0000 mg | ORAL_CAPSULE | Freq: Every day | ORAL | Status: DC

## 2013-11-05 MED ORDER — LISINOPRIL 20 MG PO TABS: 20.0000 mg | ORAL_TABLET | Freq: Every day | ORAL | Status: DC

## 2013-11-05 NOTE — Telephone Encounter (Signed)
Patient called for some refills and wanted me to find out if he is supposed to still be taking metoprolol. Also he wanted to let Dr Meda Coffee know that he appreciated her time and was very grateful for her finding out what was going on with him. Thanks, [name]

## 2013-11-05 NOTE — Telephone Encounter (Signed)
Spoke with pt about needing to take his Metoprolol as prescribed and that we will refill his cardiac meds.  Pt states he is feeling pretty good, and energy level seems to have increased.  Pt very thankful for all the assistance provided by our staff.

## 2013-11-05 NOTE — Anesthesia Postprocedure Evaluation (Signed)
  Anesthesia Post-op Note  Patient: [name]  Procedure(s) Performed: Procedure(s): TRANSESOPHAGEAL ECHOCARDIOGRAM (TEE) (N/A) CARDIOVERSION (N/A)  Patient Location: PACU  Anesthesia Type:General  Level of Consciousness: awake and alert   Airway and Oxygen Therapy: Patient Spontanous Breathing  Post-op Pain: none  Post-op Assessment: Post-op Vital signs reviewed  Post-op Vital Signs: stable  Last Vitals:  Filed Vitals:   11/03/13 1325  BP: 174/98  Pulse: 102  Temp:   Resp: 26    Complications: No apparent anesthesia complications

## 2013-11-05 NOTE — Telephone Encounter (Signed)
Yes, he should continue taking metoprolol. Please ask him how is he doing. I am really glad he is happy with our care. KN

## 2013-11-05 NOTE — Telephone Encounter (Signed)
He is doing well and has an appt with you next week.

## 2013-11-08 ENCOUNTER — Other Ambulatory Visit: Payer: Self-pay

## 2013-11-08 MED ORDER — DILTIAZEM HCL ER COATED BEADS 120 MG PO CP24: 120.0000 mg | ORAL_CAPSULE | Freq: Every day | ORAL | Status: DC

## 2013-11-08 MED ORDER — LISINOPRIL 20 MG PO TABS: 20.0000 mg | ORAL_TABLET | Freq: Every day | ORAL | Status: DC

## 2013-11-09 ENCOUNTER — Telehealth: Payer: Self-pay | Admitting: *Deleted

## 2013-11-09 NOTE — Telephone Encounter (Signed)
PA for diltiazem to [facility]

## 2013-11-10 ENCOUNTER — Ambulatory Visit (INDEPENDENT_AMBULATORY_CARE_PROVIDER_SITE_OTHER): Payer: Managed Care, Other (non HMO) | Admitting: Cardiology

## 2013-11-10 ENCOUNTER — Encounter: Payer: Self-pay | Admitting: Cardiology

## 2013-11-10 VITALS — BP 150/96 | HR 85 | Ht 73.0 in | Wt 201.0 lb

## 2013-11-10 DIAGNOSIS — I872 Venous insufficiency (chronic) (peripheral): Secondary | ICD-10-CM

## 2013-11-10 DIAGNOSIS — E785 Hyperlipidemia, unspecified: Secondary | ICD-10-CM

## 2013-11-10 DIAGNOSIS — I10 Essential (primary) hypertension: Secondary | ICD-10-CM

## 2013-11-10 DIAGNOSIS — I2510 Atherosclerotic heart disease of native coronary artery without angina pectoris: Secondary | ICD-10-CM

## 2013-11-10 MED ORDER — METOPROLOL SUCCINATE ER 100 MG PO TB24: 100.0000 mg | ORAL_TABLET | Freq: Every day | ORAL | Status: DC

## 2013-11-10 NOTE — Progress Notes (Signed)
Patient ID: KINGSTIN HEIMS, male   DOB: 08-Jun-1950, 63 y.o.   MRN: [id number]  Patient ID: OMRAN KEELIN, male   DOB: 1950/08/21, 63 y.o.   MRN: [id number]    Patient Name: [name] Date of Encounter: 11/10/2013  Primary Care Provider:  Default, Provider, MD Primary Cardiologist:  Dorothy Spark [facility] Dr. Verl Blalock)  Problem List   Past Medical History  Diagnosis Date  . Hypertension   . DDD (degenerative disc disease)   . HLD (hyperlipidemia)   . S/P CABG x 1   . Lumbago   . Gout   . Neuropathy   . Sciatica    Past Surgical History  Procedure Laterality Date  . Inguinal hernia repair Left   . Wrist surgery    . Coronary artery bypass graft  5.8.09    x3  . Laparoscopic inguinal hernia repair Right   . Cardiac catheterization  4.17.09  . Tonsillectomy and adenoidectomy    . Tee without cardioversion N/A 11/03/2013    Procedure: TRANSESOPHAGEAL ECHOCARDIOGRAM (TEE);  Surgeon: Dorothy Spark, MD;  Location: [facility];  Service: Cardiovascular;  Laterality: N/A;  . Cardioversion N/A 11/03/2013    Procedure: CARDIOVERSION;  Surgeon: Dorothy Spark, MD;  Location: [facility] ENDOSCOPY;  Service: Cardiovascular;  Laterality: N/A;    Allergies  Allergies  Allergen Reactions  . Compazine Other (See Comments)    A lockjaw-type reaction--face locked up     HPI  Mr. Maese comes in today to establish with me as his cardiologist. He is a former patient of Dr. Olevia Perches and Dr Verl Blalock.  He required bypass grafting in 2009. He had postoperative atrial fib.  Compliance seems to be an issue. Lipid panel last year showed total cholesterol over 300. He was supposed to be on Zocor or simvastatin at that time. He's also glucose intolerance and overweight by 15-20 pounds. His last hemoglobin A1c was 6.4. This was in April.  Alcohol maybe issue. He still says he drinks 3 beers several nights a week. He does not smoke.  He has been living in [place] taking care of his parents  in [place] for the last couple of years and has been doing okay. He denies any chest pain. However he has noticed worsening dyspnea on exertion and fatigue. No palpitations or syncope. He states he has been compliant with his medications. No orthopnea or lower extremity edema.  The patient underwent TEE and cardioversion on 11/03/2013 that was successful. He states he feels mild improvement in shortness of breath. He has been compliant to his medications including his Xarelto and is concerned about his blood pressure. He denies any other symptoms.  Home Medications  Prior to Admission medications   Medication Sig Start Date End Date Taking? Authorizing Provider  allopurinol (ZYLOPRIM) 100 MG tablet Take 100 mg by mouth as needed (for gout).   Yes Historical Provider, MD  aspirin 325 MG tablet Take 325 mg by mouth daily.   Yes Historical Provider, MD  atorvastatin (LIPITOR) 20 MG tablet Take 1 tablet (20 mg total) by mouth daily. 09/30/13  Yes Dorothy Spark, MD  diltiazem (CARDIZEM CD) 120 MG 24 hr capsule Take 1 capsule (120 mg total) by mouth daily. 09/30/13 09/30/14 Yes Dorothy Spark, MD  gabapentin (NEURONTIN) 300 MG capsule Take 300 mg by mouth as needed (for pain).   Yes Historical Provider, MD  Hydrocodone-Acetaminophen (VICODIN HP) 10-660 MG TABS Take 1 tablet by mouth as needed (for pain).  Yes Historical Provider, MD  lisinopril (PRINIVIL,ZESTRIL) 20 MG tablet Take 1 tablet (20 mg total) by mouth daily. 12/04/11  Yes Harden Mo, MD  metoprolol succinate (TOPROL XL) 50 MG 24 hr tablet Take 1 tablet (50 mg total) by mouth daily. Take with or immediately following a meal. 12/04/11  Yes Harden Mo, MD  nitroGLYCERIN (NITROSTAT) 0.4 MG SL tablet Place 1 tablet (0.4 mg total) under the tongue every 5 (five) minutes as needed for chest pain. 09/30/13 09/30/14 Yes Dorothy Spark, MD    Family History  Family History  Problem Relation Age of Onset  . Coronary artery disease       family history  . Heart disease Father     CABG   . Heart attack Father   . Stroke      family history    Social History  History   Social History  . Marital Status: Married    Spouse Name: N/A    Number of Children: N/A  . Years of Education: N/A   Occupational History  . Not on file.   Social History Main Topics  . Smoking status: Never Smoker   . Smokeless tobacco: Not on file  . Alcohol Use: Yes     Comment: 6 beers per week  . Drug Use: Not on file  . Sexual Activity: Not on file   Other Topics Concern  . Not on file   Social History Narrative  . No narrative on file     Review of Systems, as per HPI, otherwise negative General:  No chills, fever, night sweats or weight changes.  Cardiovascular:  No chest pain, dyspnea on exertion, edema, orthopnea, palpitations, paroxysmal nocturnal dyspnea. Dermatological: No rash, lesions/masses Respiratory: No cough, dyspnea Urologic: No hematuria, dysuria Abdominal:   No nausea, vomiting, diarrhea, bright red blood per rectum, melena, or hematemesis Neurologic:  No visual changes, wkns, changes in mental status. All other systems reviewed and are otherwise negative except as noted above.  Physical Exam  There were no vitals taken for this visit.  General: Pleasant, NAD Psych: Normal affect. Neuro: Alert and oriented X 3. Moves all extremities spontaneously. HEENT: Normal  Neck: Supple without bruits or JVD. Lungs:  Resp regular and unlabored, CTA. Heart: tachycardic, no s3, s4, or murmurs. Abdomen: Soft, non-tender, non-distended, BS + x 4.  Extremities: No clubbing, cyanosis or edema. DP/PT/Radials 2+ and equal bilaterally.  Labs:  No results found for this basename: CKTOTAL, CKMB, TROPONINI,  in the last 72 hours Lab Results  Component Value Date   WBC 6.9 11/01/2013   HGB 16.2 11/01/2013   HCT 47.9 11/01/2013   MCV 93.9 11/01/2013   PLT 298 11/01/2013    Lab Results  Component Value Date   DDIMER 3.14*  02/01/2011   No components found with this basename: POCBNP,     Component Value Date/Time   NA 126* 11/01/2013 1727   K 4.4 11/01/2013 1727   CL 93* 11/01/2013 1727   CO2 22 11/01/2013 1727   GLUCOSE 473* 11/01/2013 1727   GLUCOSE 144* 06/16/2006 1036   BUN 21 11/01/2013 1727   CREATININE 0.89 11/01/2013 1727   CREATININE 0.59 07/25/2012 1339   CALCIUM 9.5 11/01/2013 1727   PROT 7.5 11/01/2013 1727   ALBUMIN 4.3 11/01/2013 1727   AST 19 11/01/2013 1727   ALT 28 11/01/2013 1727   ALKPHOS 77 11/01/2013 1727   BILITOT 0.4 11/01/2013 1727   GFRNONAA >90 07/25/2012 1339  GFRAA >90 07/25/2012 1339   Lab Results  Component Value Date   CHOL 326* 11/21/2009   HDL 67.40 11/21/2009   TRIG 341.0* 11/21/2009    Accessory Clinical Findings  Echocardiogram - 11/03/2013  - Left ventricle: Systolic function was normal. The estimated ejection fraction was in the range of 50% to 55%. Wall motion was normal; there were no regional wall motion abnormalities. - Mitral valve: There was mild regurgitation. - Left atrium: The atrium was dilated. No evidence of thrombus in the atrial cavity or appendage. No evidence of thrombus in the atrial cavity or appendage. - Right atrium: No evidence of thrombus in the atrial cavity or appendage. - Atrial septum: No defect or patent foramen ovale was identified. Impressions: - No intracardiac thrombus, successful cardioversion.  ECG - 11/01/2013 - atrial flutter with 2:1 block 11/03/2013 - SR, 77 BPM, abnormal ECG    Assessment & Plan  63 year old male  1. atrial flutter with 2: 1 block, history of paroxysmal atrial fibrillation, Status post successful cardioversion on 11/03/2013. Will continue patient on Xarelto and increase metoprolol to 100 mg daily. TEE showed preserved left ventricle ejection fraction mild mitral regurgitation and mildly enlarged left atrium. The patient is in sinus rhythm on followup visit.  2. history of CAD and coronary artery bypass surgery in 2009, the  patient denies any chest pain for now we'll continue aspirin, atorvastatin, metoprolol. No reason for ischemic workup at this point.  3. Hypertension - we will increase Toprol-XL from 50-100 mg daily  3. Hyperlipidemia - we'll check lipid profile at the next visit.  Followup in 2 months.   Dorothy Spark, MD, [facility] 11/10/2013, 3:24 PM

## 2013-11-10 NOTE — Patient Instructions (Signed)
Your physician has recommended you make the following change in your medication:   [place] TO 50 MG TO 100 MG DAILY  Your physician recommends that you schedule a follow-up appointment in: [place]

## 2013-11-11 ENCOUNTER — Telehealth: Payer: Self-pay | Admitting: *Deleted

## 2013-11-11 NOTE — Telephone Encounter (Signed)
PA to [facility] for atorvastatin

## 2013-11-16 ENCOUNTER — Other Ambulatory Visit: Payer: Self-pay | Admitting: *Deleted

## 2013-11-16 MED ORDER — NITROGLYCERIN 0.4 MG SL SUBL: 0.4000 mg | SUBLINGUAL_TABLET | SUBLINGUAL | Status: DC | PRN

## 2013-11-17 ENCOUNTER — Other Ambulatory Visit: Payer: Self-pay | Admitting: *Deleted

## 2013-11-17 MED ORDER — ATORVASTATIN CALCIUM 20 MG PO TABS: 20.0000 mg | ORAL_TABLET | Freq: Every day | ORAL | Status: DC

## 2013-11-23 ENCOUNTER — Other Ambulatory Visit: Payer: Self-pay | Admitting: *Deleted

## 2013-11-23 MED ORDER — NITROGLYCERIN 0.4 MG SL SUBL: 0.4000 mg | SUBLINGUAL_TABLET | SUBLINGUAL | Status: DC | PRN

## 2013-11-23 NOTE — Telephone Encounter (Signed)
[facility] denied atorvastatin: " Medication must be filled for 90 day supply at retail or mail order"  This has already been done, I did send in an appeal.

## 2013-11-29 ENCOUNTER — Other Ambulatory Visit: Payer: Self-pay

## 2013-11-29 MED ORDER — ATORVASTATIN CALCIUM 20 MG PO TABS: 20.0000 mg | ORAL_TABLET | Freq: Every day | ORAL | Status: DC

## 2013-12-01 ENCOUNTER — Telehealth: Payer: Self-pay | Admitting: *Deleted

## 2013-12-01 NOTE — Telephone Encounter (Signed)
Patient requests xarelto samples. I will place at the front desk for pick up.

## 2013-12-10 ENCOUNTER — Telehealth: Payer: Self-pay | Admitting: *Deleted

## 2013-12-10 NOTE — Telephone Encounter (Signed)
This pt is calling to see when he is supposed to f/u with Dr Meda Coffee for further work-up for his cardiac issues.  Pt denies any cardiac complaints at this time.  Pt states I feel fine.  Pt wanted to know if he has any other tests that are supposed to be done.  Informed pt that per Dr Thera Flake last OV note and TEE note, the pt is supposed to f/u with her.  Pt is scheduled to see Dr Meda Coffee on 8/5  0830 for f/u post TEE. Pt forgot about that appt,and very glad he was advised of this.  Informed pt when he comes in for that visit, Dr Meda Coffee will make her assessment and order accordingly.  Pt verbalized understanding and agrees with this plan.

## 2013-12-10 NOTE — Telephone Encounter (Signed)
Patient would like you to call him to discuss a test that Dr Meda Coffee discussed with him. He cannot think of the name of it at this time, but he would like to schedule it sometime soon as he will be losing his insurance. Please advise. Thanks, MI

## 2014-01-05 ENCOUNTER — Telehealth: Payer: Self-pay | Admitting: Cardiology

## 2014-01-05 ENCOUNTER — Ambulatory Visit (INDEPENDENT_AMBULATORY_CARE_PROVIDER_SITE_OTHER): Payer: Managed Care, Other (non HMO) | Admitting: Cardiology

## 2014-01-05 ENCOUNTER — Encounter: Payer: Self-pay | Admitting: Cardiology

## 2014-01-05 ENCOUNTER — Telehealth: Payer: Self-pay | Admitting: *Deleted

## 2014-01-05 VITALS — BP 130/76 | HR 85 | Ht 73.0 in | Wt 204.0 lb

## 2014-01-05 DIAGNOSIS — I209 Angina pectoris, unspecified: Secondary | ICD-10-CM

## 2014-01-05 DIAGNOSIS — R079 Chest pain, unspecified: Secondary | ICD-10-CM | POA: Insufficient documentation

## 2014-01-05 DIAGNOSIS — I25119 Atherosclerotic heart disease of native coronary artery with unspecified angina pectoris: Secondary | ICD-10-CM

## 2014-01-05 DIAGNOSIS — I2510 Atherosclerotic heart disease of native coronary artery without angina pectoris: Secondary | ICD-10-CM

## 2014-01-05 DIAGNOSIS — F1010 Alcohol abuse, uncomplicated: Secondary | ICD-10-CM

## 2014-01-05 DIAGNOSIS — I872 Venous insufficiency (chronic) (peripheral): Secondary | ICD-10-CM

## 2014-01-05 DIAGNOSIS — E785 Hyperlipidemia, unspecified: Secondary | ICD-10-CM

## 2014-01-05 DIAGNOSIS — I10 Essential (primary) hypertension: Secondary | ICD-10-CM

## 2014-01-05 MED ORDER — ATORVASTATIN CALCIUM 20 MG PO TABS: 20.0000 mg | ORAL_TABLET | Freq: Every day | ORAL | Status: DC

## 2014-01-05 MED ORDER — PRAVASTATIN SODIUM 20 MG PO TABS
20.0000 mg | ORAL_TABLET | Freq: Every evening | ORAL | Status: DC
Start: 2014-01-05 — End: 2014-01-05

## 2014-01-05 NOTE — Telephone Encounter (Signed)
New message    Patient calling seen this am had to leave unable to get lab work done. Will come back

## 2014-01-05 NOTE — Telephone Encounter (Signed)
Will route this to Dr Meda Coffee for her review.

## 2014-01-05 NOTE — Progress Notes (Signed)
Patient ID: AAKASH HOLLOMON, male   DOB: [birth year], 63 y.o.   MRN: [id number]    Patient Name: [name] Date of Encounter: 01/05/2014  Primary Care Provider:  Default, Provider, MD Primary Cardiologist:  Dorothy Spark [facility] Dr. Verl Blalock)  Problem List   Past Medical History  Diagnosis Date  . Hypertension   . DDD (degenerative disc disease)   . HLD (hyperlipidemia)   . S/P CABG x 1   . Lumbago   . Gout   . Neuropathy   . Sciatica    Past Surgical History  Procedure Laterality Date  . Inguinal hernia repair Left   . Wrist surgery    . Coronary artery bypass graft  5.8.09    x3  . Laparoscopic inguinal hernia repair Right   . Cardiac catheterization  4.17.09  . Tonsillectomy and adenoidectomy    . Tee without cardioversion N/A 11/03/2013    Procedure: TRANSESOPHAGEAL ECHOCARDIOGRAM (TEE);  Surgeon: Dorothy Spark, MD;  Location: [facility] [place];  Service: Cardiovascular;  Laterality: N/A;  . Cardioversion N/A 11/03/2013    Procedure: CARDIOVERSION;  Surgeon: Dorothy Spark, MD;  Location: [facility] ENDOSCOPY;  Service: Cardiovascular;  Laterality: N/A;    Allergies  Allergies  Allergen Reactions  . Compazine Other (See Comments)    A lockjaw-type reaction--face locked up     HPI  Mr. Glaze comes in today to establish with me as his cardiologist. He is a former patient of Dr. Olevia Perches and Dr Verl Blalock.  He required bypass grafting in 2009. He had postoperative atrial fib.  Compliance seems to be an issue. Lipid panel last year showed total cholesterol over 300. He was supposed to be on Zocor or simvastatin at that time. He's also glucose intolerance and overweight by 15-20 pounds. His last hemoglobin A1c was 6.4. This was in April.  Alcohol maybe issue. He still says he drinks 3 beers several nights a week. He does not smoke.  He has been living in [place] care of his parents in [place] for the last couple of years and has been doing okay. He denies any chest  pain. However he has noticed worsening dyspnea on exertion and fatigue. No palpitations or syncope. He states he has been compliant with his medications. No orthopnea or lower extremity edema.  The patient underwent TEE and cardioversion on 11/03/2013 that was successful. He states he feels mild improvement in shortness of breath. He has been compliant to his medications including his Xarelto and is concerned about his blood pressure. He denies any other symptoms.  01/05/2014 - patient denies any palpitations however he feels that things cardioversion he has developed exertional and nonexertional chest pain. Sometimes it's partial like to it can also be sharp and right-sided. Generally weak and gets short of breath with minimal exertion.  Home Medications  Prior to Admission medications   Medication Sig Start Date End Date Taking? Authorizing Provider  allopurinol (ZYLOPRIM) 100 MG tablet Take 100 mg by mouth as needed (for gout).   Yes Historical Provider, MD  aspirin 325 MG tablet Take 325 mg by mouth daily.   Yes Historical Provider, MD  atorvastatin (LIPITOR) 20 MG tablet Take 1 tablet (20 mg total) by mouth daily. 09/30/13  Yes Dorothy Spark, MD  diltiazem (CARDIZEM CD) 120 MG 24 hr capsule Take 1 capsule (120 mg total) by mouth daily. 09/30/13 09/30/14 Yes Dorothy Spark, MD  gabapentin (NEURONTIN) 300 MG capsule Take 300 mg by mouth as needed (  for pain).   Yes Historical Provider, MD  Hydrocodone-Acetaminophen (VICODIN HP) 10-660 MG TABS Take 1 tablet by mouth as needed (for pain).    Yes Historical Provider, MD  lisinopril (PRINIVIL,ZESTRIL) 20 MG tablet Take 1 tablet (20 mg total) by mouth daily. 12/04/11  Yes Harden Mo, MD  metoprolol succinate (TOPROL XL) 50 MG 24 hr tablet Take 1 tablet (50 mg total) by mouth daily. Take with or immediately following a meal. 12/04/11  Yes Harden Mo, MD  nitroGLYCERIN (NITROSTAT) 0.4 MG SL tablet Place 1 tablet (0.4 mg total) under the tongue  every 5 (five) minutes as needed for chest pain. 09/30/13 09/30/14 Yes Dorothy Spark, MD    Family History  Family History  Problem Relation Age of Onset  . Coronary artery disease      family history  . Heart disease Father     CABG   . Heart attack Father   . Stroke      family history    Social History  History   Social History  . Marital Status: Married    Spouse Name: N/A    Number of Children: N/A  . Years of Education: N/A   Occupational History  . Not on file.   Social History Main Topics  . Smoking status: Never Smoker   . Smokeless tobacco: Not on file  . Alcohol Use: Yes     Comment: 6 beers per week  . Drug Use: Not on file  . Sexual Activity: Not on file   Other Topics Concern  . Not on file   Social History Narrative  . No narrative on file     Review of Systems, as per HPI, otherwise negative General:  No chills, fever, night sweats or weight changes.  Cardiovascular:  No chest pain, dyspnea on exertion, edema, orthopnea, palpitations, paroxysmal nocturnal dyspnea. Dermatological: No rash, lesions/masses Respiratory: No cough, dyspnea Urologic: No hematuria, dysuria Abdominal:   No nausea, vomiting, diarrhea, bright red blood per rectum, melena, or hematemesis Neurologic:  No visual changes, wkns, changes in mental status. All other systems reviewed and are otherwise negative except as noted above.  Physical Exam  Blood pressure 130/76, pulse 85, height 6\' 1"  (1.854 m), weight 204 lb (92.534 kg), SpO2 98.00%.  General: Pleasant, NAD Psych: Normal affect. Neuro: Alert and oriented X 3. Moves all extremities spontaneously. HEENT: Normal  Neck: Supple without bruits or JVD. Lungs:  Resp regular and unlabored, CTA. Heart: tachycardic, no s3, s4, or murmurs. Abdomen: Soft, non-tender, non-distended, BS + x 4.  Extremities: No clubbing, cyanosis or edema. DP/PT/Radials 2+ and equal bilaterally.  Labs:  No results found for this basename:  CKTOTAL, CKMB, TROPONINI,  in the last 72 hours Lab Results  Component Value Date   WBC 6.9 11/01/2013   HGB 16.2 11/01/2013   HCT 47.9 11/01/2013   MCV 93.9 11/01/2013   PLT 298 11/01/2013    Lab Results  Component Value Date   DDIMER 3.14* 02/01/2011   No components found with this basename: POCBNP,     Component Value Date/Time   NA 126* 11/01/2013 1727   K 4.4 11/01/2013 1727   CL 93* 11/01/2013 1727   CO2 22 11/01/2013 1727   GLUCOSE 473* 11/01/2013 1727   GLUCOSE 144* 06/16/2006 1036   BUN 21 11/01/2013 1727   CREATININE 0.89 11/01/2013 1727   CREATININE 0.59 07/25/2012 1339   CALCIUM 9.5 11/01/2013 1727   PROT 7.5 11/01/2013 1727  ALBUMIN 4.3 11/01/2013 1727   AST 19 11/01/2013 1727   ALT 28 11/01/2013 1727   ALKPHOS 77 11/01/2013 1727   BILITOT 0.4 11/01/2013 1727   GFRNONAA >90 07/25/2012 1339   GFRAA >90 07/25/2012 1339   Lab Results  Component Value Date   CHOL 326* 11/21/2009   HDL 67.40 11/21/2009   TRIG 341.0* 11/21/2009    Accessory Clinical Findings  Echocardiogram - 11/03/2013  - Left ventricle: Systolic function was normal. The estimated ejection fraction was in the range of 50% to 55%. Wall motion was normal; there were no regional wall motion abnormalities. - Mitral valve: There was mild regurgitation. - Left atrium: The atrium was dilated. No evidence of thrombus in the atrial cavity or appendage. No evidence of thrombus in the atrial cavity or appendage. - Right atrium: No evidence of thrombus in the atrial cavity or appendage. - Atrial septum: No defect or patent foramen ovale was identified. Impressions: - No intracardiac thrombus, successful cardioversion.  ECG - 11/01/2013 - atrial flutter with 2:1 block 11/03/2013 - SR, 77 BPM, abnormal ECG 01/05/2014 - normal sinus rhythm, 78 beats per minute normal EKG    Assessment & Plan  63 year old male  1. atrial flutter with 2: 1 block, history of paroxysmal atrial fibrillation, Status post successful cardioversion on 11/03/2013.  Will continue patient on Xarelto and increase metoprolol to 100 mg daily. TEE showed preserved left ventricle ejection fraction mild mitral regurgitation and mildly enlarged left atrium. The patient is in sinus rhythm on followup visit.  2. history of CAD and coronary artery bypass surgery in 2009, the patient denies any chest pain for now we'll continue aspirin, atorvastatin, metoprolol. The patient has new chest pain, we will order a Lexiscan nuclear stress test.  3. Hypertension -controlled now  4. Hyperlipidemia - the patient was started and atorvastatin 20 mg a months ago. He feels aches in his body and we will check CPK and comprehensive metabolic profile. If abnormal we will switch to pravastatin.  Followup in 2 months.   Dorothy Spark, MD, [facility] 01/05/2014, 9:14 AM

## 2014-01-05 NOTE — Patient Instructions (Signed)
Your physician recommends that you continue on your current medications as directed. Please refer to the Current Medication list given to you today.  Your physician recommends that you return for lab work in: TODAY (CMET, CPK)  Your physician has requested that you have a lexiscan myoview. For further information please visit [email or web address]. Please follow instruction sheet, as given.  Your physician recommends that you schedule a follow-up appointment in: [place]

## 2014-01-05 NOTE — Telephone Encounter (Signed)
Pt was seen in the office by Dr Meda Coffee today 8/5 and was instructed to go to the checkout dept after labs done to schedule a Lexiscan myoview for cp and a 2 month f/u appt with Dr Meda Coffee.  Pt did not stop by check out at all to have these appts set up.  Called pt and LVM for him to contact us back to have these appts arranged.

## 2014-01-27 ENCOUNTER — Ambulatory Visit (HOSPITAL_COMMUNITY): Payer: Managed Care, Other (non HMO) | Attending: Cardiology | Admitting: Radiology

## 2014-01-27 ENCOUNTER — Ambulatory Visit (INDEPENDENT_AMBULATORY_CARE_PROVIDER_SITE_OTHER): Payer: Managed Care, Other (non HMO) | Admitting: *Deleted

## 2014-01-27 VITALS — BP 142/92 | HR 60 | Ht 72.0 in | Wt 207.0 lb

## 2014-01-27 DIAGNOSIS — I10 Essential (primary) hypertension: Secondary | ICD-10-CM | POA: Insufficient documentation

## 2014-01-27 DIAGNOSIS — I872 Venous insufficiency (chronic) (peripheral): Secondary | ICD-10-CM

## 2014-01-27 DIAGNOSIS — R079 Chest pain, unspecified: Secondary | ICD-10-CM | POA: Diagnosis not present

## 2014-01-27 DIAGNOSIS — E785 Hyperlipidemia, unspecified: Secondary | ICD-10-CM

## 2014-01-27 DIAGNOSIS — F1010 Alcohol abuse, uncomplicated: Secondary | ICD-10-CM | POA: Insufficient documentation

## 2014-01-27 DIAGNOSIS — I4892 Unspecified atrial flutter: Secondary | ICD-10-CM | POA: Insufficient documentation

## 2014-01-27 DIAGNOSIS — Z951 Presence of aortocoronary bypass graft: Secondary | ICD-10-CM | POA: Insufficient documentation

## 2014-01-27 DIAGNOSIS — I2510 Atherosclerotic heart disease of native coronary artery without angina pectoris: Secondary | ICD-10-CM | POA: Diagnosis not present

## 2014-01-27 DIAGNOSIS — I4891 Unspecified atrial fibrillation: Secondary | ICD-10-CM | POA: Insufficient documentation

## 2014-01-27 DIAGNOSIS — I25119 Atherosclerotic heart disease of native coronary artery with unspecified angina pectoris: Secondary | ICD-10-CM

## 2014-01-27 LAB — COMPREHENSIVE METABOLIC PANEL
ALT: 40 U/L (ref 0–53)
AST: 50 U/L — ABNORMAL HIGH (ref 0–37)
Albumin: 4.1 g/dL (ref 3.5–5.2)
Alkaline Phosphatase: 84 U/L (ref 39–117)
BUN: 6 mg/dL (ref 6–23)
CO2: 27 mEq/L (ref 19–32)
Calcium: 9.1 mg/dL (ref 8.4–10.5)
Chloride: 97 mEq/L (ref 96–112)
Creatinine, Ser: 0.7 mg/dL (ref 0.4–1.5)
GFR: 114.12 mL/min (ref >60.00)
Glucose, Bld: 119 mg/dL — ABNORMAL HIGH (ref 70–99)
Potassium: 5.2 mEq/L — ABNORMAL HIGH (ref 3.5–5.1)
Sodium: 132 mEq/L — ABNORMAL LOW (ref 135–145)
Total Bilirubin: 0.7 mg/dL (ref 0.2–1.2)
Total Protein: 7.2 g/dL (ref 6.0–8.3)

## 2014-01-27 LAB — CK: Total CK: 54 U/L (ref 7–232)

## 2014-01-27 MED ORDER — TECHNETIUM TC 99M SESTAMIBI GENERIC - CARDIOLITE
33.0000 | Freq: Once | INTRAVENOUS | Status: AC | PRN
  Administered 2014-01-27: 33 via INTRAVENOUS

## 2014-01-27 MED ORDER — TECHNETIUM TC 99M SESTAMIBI GENERIC - CARDIOLITE
11.0000 | Freq: Once | INTRAVENOUS | Status: AC | PRN
  Administered 2014-01-27: 11 via INTRAVENOUS

## 2014-01-27 MED ORDER — REGADENOSON 0.4 MG/5ML IV SOLN
0.4000 mg | Freq: Once | INTRAVENOUS | Status: AC
  Administered 2014-01-27: 0.4 mg via INTRAVENOUS

## 2014-01-27 NOTE — Progress Notes (Signed)
[facility] NUCLEAR [facility] [address] [place], [place] [address] [phone]    Cardiology Nuclear Med Study  [name] is a 63 y.o. male     MRN : [id number]     DOB: 04/03/1951  Procedure Date: 01/27/2014  Nuclear Med Background Indication for Stress Test:  Evaluation for Ischemia and Graft Patency History:  CAD, Cath, CABG, Afib/flutter, Echo 2015 EF 50-55%, MPI 2009 (ishemia) Cardiac Risk Factors: Hypertension and Lipids  Symptoms:  Chest Pain, DOE and Fatigue   Nuclear Pre-Procedure Caffeine/Decaff Intake:  None> 12 hrs NPO After: 8:00pm   Lungs:  clear O2 Sat: 98% on room air. IV 0.9% NS with Angio Cath:  22g  IV Site: R Antecubital x 1, tolerated well IV Started by:  Irven Baltimore, RN  Chest Size (in):  44 Cup Size: n/a  Height: 6' (1.829 m)  Weight:  207 lb (93.895 kg)  BMI:  Body mass index is 28.07 kg/(m^2). Tech Comments:  Patient thinks he took Toprol last night per patient. Irven Baltimore, RN.    Nuclear Med Study 1 or 2 day study: 1 day  Stress Test Type:  Carlton Adam  Reading MD: N/A  Order Authorizing Provider:  Ena Dawley, MD  Resting Radionuclide: Technetium 17m Sestamibi  Resting Radionuclide Dose: 11.0 mCi   Stress Radionuclide:  Technetium 51m Sestamibi  Stress Radionuclide Dose: 33.0 mCi           Stress Protocol Rest HR: 60 Stress HR: 77  Rest BP: 142/92 Stress BP: 141/77  Exercise Time (min): n/a METS: n/a           Dose of Adenosine (mg):  n/a Dose of Lexiscan: 0.4 mg  Dose of Atropine (mg): n/a Dose of Dobutamine: n/a mcg/kg/min (at max HR)  Stress Test Technologist: Glade Lloyd, BS-ES  Nuclear Technologist:  Vedia Pereyra, CNMT     Rest Procedure:  Myocardial perfusion imaging was performed at rest 45 minutes following the intravenous administration of Technetium 32m Sestamibi. Rest ECG: NSR - Normal EKG  Stress Procedure:  The patient received IV Lexiscan 0.4 mg over 15-seconds.  Technetium 65m  Sestamibi injected at 30-seconds.  Quantitative spect images were obtained after a 45 minute delay.  During the infusion of Lexiscan the patient complained of chest heaviness and feeling flushed.  These symptoms resolved in recovery.  Stress ECG: No significant change from baseline ECG  QPS Raw Data Images:  Normal; no motion artifact; normal heart/lung ratio. Stress Images:  Normal homogeneous uptake in all areas of the myocardium. Rest Images:  Normal homogeneous uptake in all areas of the myocardium. Subtraction (SDS):  No evidence of ischemia. Transient Ischemic Dilatation (Normal <1.22):  1.04 Lung/Heart Ratio (Normal <0.45):  0.32  Quantitative Gated Spect Images QGS EDV:  128 ml QGS ESV:  44 ml  Impression Exercise Capacity:  Lexiscan with no exercise. BP Response:  Normal blood pressure response. Clinical Symptoms:  No significant symptoms noted. ECG Impression:  No significant ST segment change suggestive of ischemia. Comparison with Prior Nuclear Study: Since the previous study in 2009, the inferior ischemia is no longer seen.   Overall Impression:  Normal stress nuclear study.  LV Ejection Fraction: 65%.  LV Wall Motion:  NL LV Function; NL Wall Motion,.   Thayer Headings, Brooke Bonito., MD, [facility] 01/27/2014, 3:35 PM [address]  [place] Pager [phone]

## 2014-01-28 ENCOUNTER — Telehealth: Payer: Self-pay | Admitting: *Deleted

## 2014-01-28 MED ORDER — PRAVASTATIN SODIUM 20 MG PO TABS: 20.0000 mg | ORAL_TABLET | Freq: Every evening | ORAL | Status: DC

## 2014-01-28 NOTE — Telephone Encounter (Signed)
Informed pt that per Dr Meda Coffee his stress test is normal with LVEF and no ischemia. Informed pt in great detail about his cmet, labwork, and  that per Dr Meda Coffee we will switch him to pravastatin 20 mg po daily and d/c his atorvastatin.  Confirmed pharmacy of choice with pt.  Pt verbalized understanding and agrees with this plan.

## 2014-01-28 NOTE — Telephone Encounter (Signed)
Message copied by Nuala Alpha on Fri Jan 28, 2014 12:43 PM ------      Message from: Dorothy Spark      Created: Fri Jan 28, 2014 11:50 AM       Normal stress test with normal LVEF and no ischemia.      We will switch to pravastatin 20 mg po daily (from atorvastatin) ------

## 2015-05-09 ENCOUNTER — Encounter: Payer: Self-pay | Admitting: Internal Medicine

## 2015-06-01 ENCOUNTER — Other Ambulatory Visit: Payer: Self-pay | Admitting: Cardiology

## 2015-06-02 ENCOUNTER — Other Ambulatory Visit: Payer: Self-pay | Admitting: *Deleted

## 2015-06-02 MED ORDER — PRAVASTATIN SODIUM 20 MG PO TABS: 20.0000 mg | ORAL_TABLET | Freq: Every evening | ORAL | Status: DC

## 2015-06-02 MED ORDER — ATORVASTATIN CALCIUM 20 MG PO TABS: 20.0000 mg | ORAL_TABLET | Freq: Every day | ORAL | Status: DC

## 2015-06-02 MED ORDER — METOPROLOL SUCCINATE ER 100 MG PO TB24: 100.0000 mg | ORAL_TABLET | Freq: Every day | ORAL | Status: DC

## 2015-06-02 MED ORDER — NITROGLYCERIN 0.4 MG SL SUBL: SUBLINGUAL_TABLET | SUBLINGUAL | Status: DC

## 2015-06-02 MED ORDER — DILTIAZEM HCL ER COATED BEADS 120 MG PO CP24: 120.0000 mg | ORAL_CAPSULE | Freq: Every day | ORAL | Status: DC

## 2015-06-02 MED ORDER — RIVAROXABAN 20 MG PO TABS: 20.0000 mg | ORAL_TABLET | Freq: Every day | ORAL | Status: DC

## 2015-06-07 ENCOUNTER — Ambulatory Visit: Payer: Managed Care, Other (non HMO) | Admitting: Cardiology

## 2015-07-11 ENCOUNTER — Ambulatory Visit (AMBULATORY_SURGERY_CENTER): Payer: Self-pay | Admitting: *Deleted

## 2015-07-11 VITALS — Ht 73.0 in | Wt 212.0 lb

## 2015-07-11 DIAGNOSIS — Z1211 Encounter for screening for malignant neoplasm of colon: Secondary | ICD-10-CM

## 2015-07-11 NOTE — Progress Notes (Signed)
No egg or soy allergy known to patient  No issues with past sedation with any surgeries  or procedures, no intubation problems  No diet pills per patient No home 02 use per patient  No blood thinners per patient -off xarelto for years per patient

## 2015-07-19 ENCOUNTER — Ambulatory Visit: Payer: Managed Care, Other (non HMO) | Admitting: Cardiology

## 2015-07-25 ENCOUNTER — Ambulatory Visit (AMBULATORY_SURGERY_CENTER): Payer: Managed Care, Other (non HMO) | Admitting: Internal Medicine

## 2015-07-25 ENCOUNTER — Encounter: Payer: Self-pay | Admitting: Internal Medicine

## 2015-07-25 VITALS — BP 132/67 | HR 52 | Temp 96.7°F | Resp 15 | Ht 73.0 in | Wt 212.0 lb

## 2015-07-25 DIAGNOSIS — Z1211 Encounter for screening for malignant neoplasm of colon: Secondary | ICD-10-CM | POA: Diagnosis not present

## 2015-07-25 DIAGNOSIS — D124 Benign neoplasm of descending colon: Secondary | ICD-10-CM | POA: Diagnosis not present

## 2015-07-25 DIAGNOSIS — D123 Benign neoplasm of transverse colon: Secondary | ICD-10-CM | POA: Diagnosis not present

## 2015-07-25 DIAGNOSIS — Z86010 Personal history of colonic polyps: Secondary | ICD-10-CM

## 2015-07-25 DIAGNOSIS — D125 Benign neoplasm of sigmoid colon: Secondary | ICD-10-CM | POA: Diagnosis not present

## 2015-07-25 DIAGNOSIS — Z860101 Personal history of adenomatous and serrated colon polyps: Secondary | ICD-10-CM

## 2015-07-25 HISTORY — DX: Personal history of colonic polyps: Z86.010

## 2015-07-25 HISTORY — DX: Personal history of adenomatous and serrated colon polyps: Z86.0101

## 2015-07-25 MED ORDER — SODIUM CHLORIDE 0.9 % IV SOLN: 500.0000 mL | INTRAVENOUS | Status: DC

## 2015-07-25 NOTE — Progress Notes (Signed)
Called to room to assist during endoscopic procedure.  Patient ID and intended procedure confirmed with present staff. Received instructions for my participation in the procedure from the performing physician.  

## 2015-07-25 NOTE — Progress Notes (Signed)
Report to PACU, RN, vss, BBS= Clear.  

## 2015-07-25 NOTE — Patient Instructions (Addendum)
I found and removed 3 polyps - all look benign. I will let you know pathology results and when to have another routine colonoscopy by mail.  I appreciate the opportunity to care for you. Gatha Mayer, MD, FACG  YOU HAD AN ENDOSCOPIC PROCEDURE TODAY AT [facility]:   Refer to the procedure report that was given to you for any specific questions about what was found during the examination.  If the procedure report does not answer your questions, please call your gastroenterologist to clarify.  If you requested that your care partner not be given the details of your procedure findings, then the procedure report has been included in a sealed envelope for you to review at your convenience later.  YOU SHOULD EXPECT: Some feelings of bloating in the abdomen. Passage of more gas than usual.  Walking can help get rid of the air that was put into your GI tract during the procedure and reduce the bloating. If you had a lower endoscopy (such as a colonoscopy or flexible sigmoidoscopy) you may notice spotting of blood in your stool or on the toilet paper. If you underwent a bowel prep for your procedure, you may not have a normal bowel movement for a few days.  Please Note:  You might notice some irritation and congestion in your nose or some drainage.  This is from the oxygen used during your procedure.  There is no need for concern and it should clear up in a day or so.  SYMPTOMS TO REPORT IMMEDIATELY:   Following lower endoscopy (colonoscopy or flexible sigmoidoscopy):  Excessive amounts of blood in the stool  Significant tenderness or worsening of abdominal pains  Swelling of the abdomen that is new, acute  Fever of 100F or higher   For urgent or emergent issues, a gastroenterologist can be reached at any hour by calling [phone].   DIET: Your first meal following the procedure should be a small meal and then it is ok to progress to your normal diet. Heavy or fried  foods are harder to digest and may make you feel nauseous or bloated.  Likewise, meals heavy in dairy and vegetables can increase bloating.  Drink plenty of fluids but you should avoid alcoholic beverages for 24 hours.  ACTIVITY:  You should plan to take it easy for the rest of today and you should NOT DRIVE or use heavy machinery until tomorrow (because of the sedation medicines used during the test).    FOLLOW UP: Our staff will call the number listed on your records the next business day following your procedure to check on you and address any questions or concerns that you may have regarding the information given to you following your procedure. If we do not reach you, we will leave a message.  However, if you are feeling well and you are not experiencing any problems, there is no need to return our call.  We will assume that you have returned to your regular daily activities without incident.  If any biopsies were taken you will be contacted by phone or by letter within the next 1-3 weeks.  Please call us at [phone] if you have not heard about the biopsies in 3 weeks.    SIGNATURES/CONFIDENTIALITY: You and/or your care partner have signed paperwork which will be entered into your electronic medical record.  These signatures attest to the fact that that the information above on your After Visit Summary has been reviewed and is  understood.  Full responsibility of the confidentiality of this discharge information lies with you and/or your care-partner.  Polyp information given.

## 2015-07-25 NOTE — Op Note (Signed)
[facility]  [address] [place], [id number]   COLONOSCOPY PROCEDURE REPORT  PATIENT: [name], [name]  MR#: [id number] BIRTHDATE: [birth year] , 64  yrs. old GENDER: male ENDOSCOPIST: Gatha Mayer, MD, [facility] PROCEDURE DATE:  07/25/2015 PROCEDURE:   Colonoscopy, screening and Colonoscopy with snare polypectomy First Screening Colonoscopy - Avg.  risk and is 50 yrs.  old or older - No.  Prior Negative Screening - Now for repeat screening. 10 or more years since last screening  History of Adenoma - Now for follow-up colonoscopy & has been > or = to 3 yrs.  N/A  Polyps removed today? Yes ASA CLASS:   Class III INDICATIONS:Screening for colonic neoplasia and Colorectal Neoplasm Risk Assessment for this procedure is average risk. MEDICATIONS: Propofol 400 mg IV and Monitored anesthesia care  DESCRIPTION OF PROCEDURE:   After the risks benefits and alternatives of the procedure were thoroughly explained, informed consent was obtained.  The digital rectal exam revealed no abnormalities of the rectum, revealed no prostatic nodules, and revealed the prostate was not enlarged.   The LB [id number]-[id number] endoscope was introduced through the anus and advanced to the cecum, which was identified by both the appendix and ileocecal valve. No adverse events experienced.   The quality of the prep was good.  (MiraLax was used)  The instrument was then slowly withdrawn as the colon was fully examined. Estimated blood loss is zero unless otherwise noted in this procedure report.      COLON FINDINGS: Three polypoid shaped polyps ranging from 4 to 107mm in size were found in the sigmoid colon, descending colon, and transverse colon.   The examination was otherwise normal. Retroflexed views revealed no abnormalities. The time to cecum = 5.1 Withdrawal time = 11.0   The scope was withdrawn and the procedure completed. COMPLICATIONS: There were no immediate  complications.  ENDOSCOPIC IMPRESSION: 1.   Three polyps ranging from 4 to 57mm in size were found in the sigmoid colon, descending colon, and transverse colon 2.   The examination was otherwise normal - good prep - second screening  RECOMMENDATIONS: Timing of repeat colonoscopy will be determined by pathology findings.  eSigned:  Gatha Mayer, MD, [facility] 07/25/2015 9:01 AM   cc: The Patient

## 2015-07-26 ENCOUNTER — Telehealth: Payer: Self-pay

## 2015-07-26 NOTE — Telephone Encounter (Signed)
  Follow up Call-  Call back number 07/25/2015  Post procedure Call Back phone  # [phone]  Permission to leave phone message Yes     Patient questions:  Do you have a fever, pain , or abdominal swelling? No. Pain Score  0 *  Have you tolerated food without any problems? Yes.    Have you been able to return to your normal activities? Yes.    Do you have any questions about your discharge instructions: Diet   No. Medications  No. Follow up visit  Yes.    Do you have questions or concerns about your Care? No.  Actions: * If pain score is 4 or above: No action needed, pain <4.

## 2015-08-03 ENCOUNTER — Encounter: Payer: Self-pay | Admitting: Internal Medicine

## 2015-08-03 DIAGNOSIS — Z86010 Personal history of colonic polyps: Secondary | ICD-10-CM

## 2015-08-03 NOTE — Progress Notes (Signed)
Quick Note:  2 adenomas both < 1 cm Recall 2022 ______

## 2015-08-28 ENCOUNTER — Telehealth (HOSPITAL_COMMUNITY): Payer: Self-pay | Admitting: *Deleted

## 2015-08-28 ENCOUNTER — Encounter: Payer: Self-pay | Admitting: Cardiology

## 2015-08-28 ENCOUNTER — Ambulatory Visit (INDEPENDENT_AMBULATORY_CARE_PROVIDER_SITE_OTHER): Payer: Managed Care, Other (non HMO) | Admitting: Cardiology

## 2015-08-28 VITALS — BP 138/80 | HR 67 | Ht 73.0 in | Wt 210.0 lb

## 2015-08-28 DIAGNOSIS — I8393 Asymptomatic varicose veins of bilateral lower extremities: Secondary | ICD-10-CM | POA: Diagnosis not present

## 2015-08-28 DIAGNOSIS — R079 Chest pain, unspecified: Secondary | ICD-10-CM

## 2015-08-28 DIAGNOSIS — Z951 Presence of aortocoronary bypass graft: Secondary | ICD-10-CM

## 2015-08-28 DIAGNOSIS — I119 Hypertensive heart disease without heart failure: Secondary | ICD-10-CM

## 2015-08-28 DIAGNOSIS — I8390 Asymptomatic varicose veins of unspecified lower extremity: Secondary | ICD-10-CM

## 2015-08-28 DIAGNOSIS — E785 Hyperlipidemia, unspecified: Secondary | ICD-10-CM

## 2015-08-28 DIAGNOSIS — Z5309 Procedure and treatment not carried out because of other contraindication: Secondary | ICD-10-CM

## 2015-08-28 DIAGNOSIS — I480 Paroxysmal atrial fibrillation: Secondary | ICD-10-CM

## 2015-08-28 DIAGNOSIS — I4892 Unspecified atrial flutter: Secondary | ICD-10-CM

## 2015-08-28 MED ORDER — PRAVASTATIN SODIUM 20 MG PO TABS: 20.0000 mg | ORAL_TABLET | Freq: Every evening | ORAL | Status: DC

## 2015-08-28 MED ORDER — ATORVASTATIN CALCIUM 20 MG PO TABS: 20.0000 mg | ORAL_TABLET | Freq: Every day | ORAL | Status: DC

## 2015-08-28 MED ORDER — METOPROLOL SUCCINATE ER 100 MG PO TB24: 100.0000 mg | ORAL_TABLET | Freq: Every day | ORAL | Status: DC

## 2015-08-28 MED ORDER — DILTIAZEM HCL ER COATED BEADS 120 MG PO CP24: 120.0000 mg | ORAL_CAPSULE | Freq: Every day | ORAL | Status: DC

## 2015-08-28 MED ORDER — NITROGLYCERIN 0.4 MG SL SUBL: SUBLINGUAL_TABLET | SUBLINGUAL | Status: DC

## 2015-08-28 MED ORDER — LISINOPRIL 20 MG PO TABS: 20.0000 mg | ORAL_TABLET | Freq: Every day | ORAL | Status: DC

## 2015-08-28 NOTE — Progress Notes (Signed)
Patient ID: [name], male   DOB: [birth year], 65 y.o.   MRN: [id number]    Patient Name: [name] Date of Encounter: 08/28/2015  Primary Care Provider:  No PCP Per Patient Primary Cardiologist:  Dorothy Spark [facility] Dr. Verl Blalock)  Problem List   Past Medical History  Diagnosis Date  . Hypertension   . DDD (degenerative disc disease)   . HLD (hyperlipidemia)   . S/P CABG x 1   . Lumbago     (low back pain)   . Gout   . Neuropathy ([place])   . Sciatica   . Atrial fibrillation ([facility])     metoprolol  . Hx of adenomatous colonic polyps 07/25/2015   Past Surgical History  Procedure Laterality Date  . Inguinal hernia repair Left   . Wrist surgery    . Coronary artery bypass graft  5.8.09    x3  . Laparoscopic inguinal hernia repair Right   . Cardiac catheterization  4.17.09  . Tonsillectomy and adenoidectomy    . Tee without cardioversion N/A 11/03/2013    Procedure: TRANSESOPHAGEAL ECHOCARDIOGRAM (TEE);  Surgeon: Dorothy Spark, MD;  Location: [facility];  Service: Cardiovascular;  Laterality: N/A;  . Cardioversion N/A 11/03/2013    Procedure: CARDIOVERSION;  Surgeon: Dorothy Spark, MD;  Location: [facility];  Service: Cardiovascular;  Laterality: N/A;  . Colonoscopy  10-01-2002    gessner-tics and hems    Allergies  Allergies  Allergen Reactions  . Compazine Other (See Comments)    A lockjaw-type reaction--face locked up    HPI  65 year old patient with known history of CAD, CABG in 2009, also history of paroxysmal atrial fibrillation, heavy chronic alcohol abuse, hyperlipidemia impaired glucose tolerance.  This is 18 months follow-up, the patient states that he has been in [place] for a long time as he is taking care of his elderly parents. He admits to continue to drink heavily. He appears very anxious today despite not having symptoms of shortness of breath, chest pain, palpitations or syncope, no lower extremity edema orthopnea or proximal  nocturnal dyspnea he is very anxious about possible blockages in his heart. He stated several times that he is really worrying about possible stroke and would like to be on full anticoagulation, but then states that he doesn't really want to be on it because he is worrying about possible bleeding. When asked about 48 for breakfast he doesn't remember. He states that he is compliant to his meds. He is also really worrying About possible blockages in his carotid arteries. He walks with a cane as he has back pain. The patient would also like to be evaluated by vascular surgery for viruses don't cause him any pain but he is not happy with cosmetic appearance.  Home Medications  Prior to Admission medications   Medication Sig Start Date End Date Taking? Authorizing Provider  allopurinol (ZYLOPRIM) 100 MG tablet Take 100 mg by mouth as needed (for gout).   Yes Historical Provider, MD  aspirin 325 MG tablet Take 325 mg by mouth daily.   Yes Historical Provider, MD  atorvastatin (LIPITOR) 20 MG tablet Take 1 tablet (20 mg total) by mouth daily. 09/30/13  Yes Dorothy Spark, MD  diltiazem (CARDIZEM CD) 120 MG 24 hr capsule Take 1 capsule (120 mg total) by mouth daily. 09/30/13 09/30/14 Yes Dorothy Spark, MD  gabapentin (NEURONTIN) 300 MG capsule Take 300 mg by mouth as needed (for pain).   Yes Historical Provider, MD  Hydrocodone-Acetaminophen (VICODIN HP) 10-660 MG TABS Take 1 tablet by mouth as needed (for pain).    Yes Historical Provider, MD  lisinopril (PRINIVIL,ZESTRIL) 20 MG tablet Take 1 tablet (20 mg total) by mouth daily. 12/04/11  Yes Harden Mo, MD  metoprolol succinate (TOPROL XL) 50 MG 24 hr tablet Take 1 tablet (50 mg total) by mouth daily. Take with or immediately following a meal. 12/04/11  Yes Harden Mo, MD  nitroGLYCERIN (NITROSTAT) 0.4 MG SL tablet Place 1 tablet (0.4 mg total) under the tongue every 5 (five) minutes as needed for chest pain. 09/30/13 09/30/14 Yes Dorothy Spark,  MD    Family History  Family History  Problem Relation Age of Onset  . Coronary artery disease      family history  . Heart disease Father     CABG   . Heart attack Father   . Stroke      family history  . Colon cancer Neg Hx   . Colon polyps Neg Hx   . Esophageal cancer Neg Hx   . Rectal cancer Neg Hx   . Stomach cancer Neg Hx     Social History  Social History   Social History  . Marital Status: Married    Spouse Name: N/A  . Number of Children: N/A  . Years of Education: N/A   Occupational History  . Not on file.   Social History Main Topics  . Smoking status: Never Smoker   . Smokeless tobacco: Never Used  . Alcohol Use: 0.0 oz/week    0 Standard drinks or equivalent per week     Comment: 6 beers per week  . Drug Use: No  . Sexual Activity: Not on file   Other Topics Concern  . Not on file   Social History Narrative     Review of Systems, as per HPI, otherwise negative General:  No chills, fever, night sweats or weight changes.  Cardiovascular:  No chest pain, dyspnea on exertion, edema, orthopnea, palpitations, paroxysmal nocturnal dyspnea. Dermatological: No rash, lesions/masses Respiratory: No cough, dyspnea Urologic: No hematuria, dysuria Abdominal:   No nausea, vomiting, diarrhea, bright red blood per rectum, melena, or hematemesis Neurologic:  No visual changes, wkns, changes in mental status. All other systems reviewed and are otherwise negative except as noted above.  Physical Exam  Blood pressure 138/80, pulse 67, height 6\' 1"  (1.854 m), weight 210 lb (95.255 kg).  General: Pleasant, NAD Psych: Normal affect. Neuro: Alert and oriented X 3. Moves all extremities spontaneously. HEENT: Normal  Neck: Supple without bruits or JVD. Lungs:  Resp regular and unlabored, CTA. Heart: tachycardic, no s3, s4, or murmurs. Abdomen: Soft, non-tender, non-distended, BS + x 4.  Extremities: No clubbing, cyanosis or edema, hyperpigmentation and chronic  changes secondary to chronic venous stasis, varices bilaterally. DP/PT/Radials 2+ and equal bilaterally.  Labs:  No results for input(s): CKTOTAL, CKMB, TROPONINI in the last 72 hours. Lab Results  Component Value Date   WBC 6.9 11/01/2013   HGB 16.2 11/01/2013   HCT 47.9 11/01/2013   MCV 93.9 11/01/2013   PLT 298 11/01/2013    Lab Results  Component Value Date   DDIMER 3.14* 02/01/2011   Invalid input(s): POCBNP    Component Value Date/Time   NA 132* 01/27/2014 1016   K 5.2* 01/27/2014 1016   CL 97 01/27/2014 1016   CO2 27 01/27/2014 1016   GLUCOSE 119* 01/27/2014 1016   GLUCOSE 144* 06/16/2006 1036   BUN  6 01/27/2014 1016   CREATININE 0.7 01/27/2014 1016   CREATININE 0.89 11/01/2013 1727   CALCIUM 9.1 01/27/2014 1016   PROT 7.2 01/27/2014 1016   ALBUMIN 4.1 01/27/2014 1016   AST 50* 01/27/2014 1016   ALT 40 01/27/2014 1016   ALKPHOS 84 01/27/2014 1016   BILITOT 0.7 01/27/2014 1016   GFRNONAA >90 07/25/2012 1339   GFRAA >90 07/25/2012 1339   Lab Results  Component Value Date   CHOL 326* 11/21/2009   HDL 67.40 11/21/2009   TRIG 341.0* 11/21/2009    Accessory Clinical Findings  Echocardiogram - 11/03/2013  - Left ventricle: Systolic function was normal. The estimated ejection fraction was in the range of 50% to 55%. Wall motion was normal; there were no regional wall motion abnormalities. - Mitral valve: There was mild regurgitation. - Left atrium: The atrium was dilated. No evidence of thrombus in the atrial cavity or appendage. No evidence of thrombus in the atrial cavity or appendage. - Right atrium: No evidence of thrombus in the atrial cavity or appendage. - Atrial septum: No defect or patent foramen ovale was identified. Impressions: - No intracardiac thrombus, successful cardioversion.  ECG - 11/01/2013 - atrial flutter with 2:1 block 11/03/2013 - SR, 77 BPM, abnormal ECG 01/05/2014 - normal sinus rhythm, 78 beats per minute normal EKG  Lexiscan  nuclear stress test: 01/2014 Impression Exercise Capacity:  Lexiscan with no exercise. BP Response:  Normal blood pressure response. Clinical Symptoms:  No significant symptoms noted. ECG Impression:  No significant ST segment change suggestive of ischemia. Comparison with Prior Nuclear Study: Since the previous study in 2009, the inferior ischemia is no longer seen.   Overall Impression:  Normal stress nuclear study.  LV Ejection Fraction: 65%.  LV Wall Motion:  NL LV Function; NL Wall Motion,.     Assessment & Plan  1. atrial flutter with 2: 1 block, history of paroxysmal atrial fibrillation, Status post successful cardioversion on 11/03/2013. When I saw the patient last time I recommended to continue Xarelto however today he is not on it. He remains in sinus rhythm. Patient is not sure we discontinued it Considering heavy alcohol drinking and prior noncompliance I would rather continue aspirin only.  2. CAD, CABG in 2009, continue aspirin, atorvastatin, metoprolol. Patient is very anxious and would like to be reassured that there are no new blockages in his heart considering he is not very active and walks with a cane we will order a Lexiscan nuclear stress test.   3. Hypertension -controlled, continue the same regimen.  4. Hyperlipidemia - the patient didn't tolerate atorvastatin in the past, but is tolerating pravastatin.  5. Lower extremity varices - the patient would like to be referred to vascular surgeon for further evaluation.  Followup in 1 year. We will check CMP, CBC, lipid profile on a daily stress tests as he is not fasting today.  It is unclear if the patient is intoxicated or is experiencing signs of alcoholic encephalopathy but he appears confused with memory impairment.   Dorothy Spark, MD, [facility] 08/28/2015, 10:38 AM

## 2015-08-28 NOTE — Patient Instructions (Addendum)
Medication Instructions:   Your physician recommends that you continue on your current medications as directed. Please refer to the Current Medication list given to you today.     Labwork: Your physician recommends that you return for lab work on day of stress test --BMP, CBC, Liver function tests, Lipid profile.   Testing/Procedures: Your physician has requested that you have a lexiscan myoview. For further information please visit [email or web address]. Please follow instruction sheet, as given.  You have been referred to Vascular Surgeons.      Follow-Up:  Your physician wants you to follow-up in: 12 months.  You will receive a reminder letter in the mail two months in advance. If you don't receive a letter, please call our office to schedule the follow-up appointment.   You will receive a reminder letter in the mail two months in advance. If you don't receive a letter, please call our office to schedule the follow-up appointment.   Any Other Special Instructions Will Be Listed Below (If Applicable).     If you need a refill on your cardiac medications before your next appointment, please call your pharmacy.

## 2015-08-28 NOTE — Telephone Encounter (Signed)
Left message on voicemail in reference to upcoming appointment scheduled for 08/31/15. Phone number given for a call back so details instructions can be given. Oluwatobi Visser, [place]

## 2015-08-29 ENCOUNTER — Telehealth (HOSPITAL_COMMUNITY): Payer: Self-pay | Admitting: *Deleted

## 2015-08-29 NOTE — Telephone Encounter (Signed)
Patient given detailed instructions per Myocardial Perfusion Study Information Sheet for the test on 08/31/15 at 0715. Patient notified to arrive 15 minutes early and that it is imperative to arrive on time for appointment to keep from having the test rescheduled.  If you need to cancel or reschedule your appointment, please call the office within 24 hours of your appointment. Failure to do so may result in a cancellation of your appointment, and a $50 no show fee. Patient verbalized understanding.Sarthak Rubenstein, [place]

## 2015-08-31 ENCOUNTER — Other Ambulatory Visit (INDEPENDENT_AMBULATORY_CARE_PROVIDER_SITE_OTHER): Payer: Managed Care, Other (non HMO) | Admitting: *Deleted

## 2015-08-31 ENCOUNTER — Ambulatory Visit (HOSPITAL_COMMUNITY): Payer: Managed Care, Other (non HMO) | Attending: Cardiovascular Disease

## 2015-08-31 DIAGNOSIS — R079 Chest pain, unspecified: Secondary | ICD-10-CM

## 2015-08-31 DIAGNOSIS — I10 Essential (primary) hypertension: Secondary | ICD-10-CM | POA: Insufficient documentation

## 2015-08-31 DIAGNOSIS — R0609 Other forms of dyspnea: Secondary | ICD-10-CM | POA: Diagnosis not present

## 2015-08-31 DIAGNOSIS — E785 Hyperlipidemia, unspecified: Secondary | ICD-10-CM

## 2015-08-31 LAB — BASIC METABOLIC PANEL
BUN: 18 mg/dL (ref 7–25)
CO2: 27 mmol/L (ref 20–31)
Calcium: 9.4 mg/dL (ref 8.6–10.3)
Chloride: 96 mmol/L — ABNORMAL LOW (ref 98–110)
Creat: 0.85 mg/dL (ref 0.70–1.25)
Glucose, Bld: 155 mg/dL — ABNORMAL HIGH (ref 65–99)
Potassium: 4.4 mmol/L (ref 3.5–5.3)
Sodium: 133 mmol/L — ABNORMAL LOW (ref 135–146)

## 2015-08-31 LAB — HEPATIC FUNCTION PANEL
ALT: 33 U/L (ref 9–46)
AST: 38 U/L — ABNORMAL HIGH (ref 10–35)
Albumin: 4.4 g/dL (ref 3.6–5.1)
Alkaline Phosphatase: 94 U/L (ref 40–115)
Bilirubin, Direct: 0.2 mg/dL (ref <=0.2)
Indirect Bilirubin: 0.6 mg/dL (ref 0.2–1.2)
Total Bilirubin: 0.8 mg/dL (ref 0.2–1.2)
Total Protein: 7.3 g/dL (ref 6.1–8.1)

## 2015-08-31 LAB — CBC
HCT: 46.1 % (ref 39.0–52.0)
Hemoglobin: 15.3 g/dL (ref 13.0–17.0)
MCH: 31.5 pg (ref 26.0–34.0)
MCHC: 33.2 g/dL (ref 30.0–36.0)
MCV: 95.1 fL (ref 78.0–100.0)
MPV: 10.4 fL (ref 8.6–12.4)
Platelets: 180 10*3/uL (ref 150–400)
RBC: 4.85 MIL/uL (ref 4.22–5.81)
RDW: 13.7 % (ref 11.5–15.5)
WBC: 5.5 10*3/uL (ref 4.0–10.5)

## 2015-08-31 LAB — MYOCARDIAL PERFUSION IMAGING
LV dias vol: 137 mL (ref 62–150)
LV sys vol: 49 mL
Peak HR: 95 {beats}/min
RATE: 0.31
Rest HR: 65 {beats}/min
SDS: 1
SRS: 1
SSS: 2
TID: 1.1

## 2015-08-31 LAB — LIPID PANEL
Cholesterol: 304 mg/dL — ABNORMAL HIGH (ref 125–200)
HDL: 65 mg/dL (ref >=40)
LDL Cholesterol: 167 mg/dL — ABNORMAL HIGH (ref <130)
Total CHOL/HDL Ratio: 4.7 Ratio (ref <=5.0)
Triglycerides: 358 mg/dL — ABNORMAL HIGH (ref <150)
VLDL: 72 mg/dL — ABNORMAL HIGH (ref <30)

## 2015-08-31 MED ORDER — TECHNETIUM TC 99M SESTAMIBI GENERIC - CARDIOLITE
32.7000 | Freq: Once | INTRAVENOUS | Status: AC | PRN
  Administered 2015-08-31: 32.7 via INTRAVENOUS

## 2015-08-31 MED ORDER — TECHNETIUM TC 99M SESTAMIBI GENERIC - CARDIOLITE
10.5000 | Freq: Once | INTRAVENOUS | Status: AC | PRN
  Administered 2015-08-31: 11 via INTRAVENOUS

## 2015-08-31 MED ORDER — REGADENOSON 0.4 MG/5ML IV SOLN
0.4000 mg | Freq: Once | INTRAVENOUS | Status: AC
  Administered 2015-08-31: 0.4 mg via INTRAVENOUS

## 2015-09-01 ENCOUNTER — Telehealth: Payer: Self-pay | Admitting: *Deleted

## 2015-09-01 MED ORDER — PRAVASTATIN SODIUM 20 MG PO TABS: 20.0000 mg | ORAL_TABLET | Freq: Every evening | ORAL | Status: DC

## 2015-09-01 MED ORDER — DILTIAZEM HCL ER COATED BEADS 120 MG PO CP24: 120.0000 mg | ORAL_CAPSULE | Freq: Every day | ORAL | Status: DC

## 2015-09-01 MED ORDER — LISINOPRIL 20 MG PO TABS: 20.0000 mg | ORAL_TABLET | Freq: Every day | ORAL | Status: DC

## 2015-09-01 MED ORDER — METOPROLOL SUCCINATE ER 100 MG PO TB24: 100.0000 mg | ORAL_TABLET | Freq: Every day | ORAL | Status: DC

## 2015-09-01 NOTE — Telephone Encounter (Signed)
-----   Message from Dorothy Spark, MD sent at 09/01/2015  6:53 AM EDT ----- Normal CBC, normal Crea, other findings associated with excessive alcohol use - elevated AST, high lipids, glucose, he needs to cut down or quit, continue pravastatin, Please delete atorvastatin from his meds as he is not supposed to take it (didnt tolerate it in the past), thank you

## 2015-09-01 NOTE — Telephone Encounter (Signed)
Notified the pt of lab results and recommendations per Dr Meda Coffee, mentioned below.  Discontinued atorvastatin in the pts med list per Dr Meda Coffee.  Pt request that we change all his cardiac meds to a 90 day supply, for his insurance is now requiring that.  Noted that the pt was only sent in a month supply of his cardiac meds on 3/27.  Informed the pt that I will send those meds for a 90 day supply, to his confirmed pharmacy of choice.  Pt verbalized understanding and agrees with this plan.

## 2015-09-27 ENCOUNTER — Other Ambulatory Visit: Payer: Self-pay | Admitting: *Deleted

## 2015-09-27 DIAGNOSIS — I8390 Asymptomatic varicose veins of unspecified lower extremity: Secondary | ICD-10-CM

## 2015-11-24 ENCOUNTER — Encounter: Payer: Self-pay | Admitting: Vascular Surgery

## 2015-12-01 ENCOUNTER — Encounter: Payer: Managed Care, Other (non HMO) | Admitting: Vascular Surgery

## 2015-12-01 ENCOUNTER — Ambulatory Visit (HOSPITAL_COMMUNITY): Payer: Managed Care, Other (non HMO) | Attending: Vascular Surgery

## 2017-08-12 ENCOUNTER — Telehealth: Payer: Self-pay | Admitting: General Practice

## 2017-08-12 ENCOUNTER — Encounter (HOSPITAL_COMMUNITY): Payer: Self-pay | Admitting: Family Medicine

## 2017-08-12 ENCOUNTER — Ambulatory Visit (HOSPITAL_COMMUNITY)
Admission: EM | Admit: 2017-08-12 | Discharge: 2017-08-12 | Disposition: A | Discharge status: Home / Self Care | Payer: Medicare Other | Attending: Nurse Practitioner | Admitting: Nurse Practitioner

## 2017-08-12 DIAGNOSIS — F419 Anxiety disorder, unspecified: Secondary | ICD-10-CM

## 2017-08-12 DIAGNOSIS — R2241 Localized swelling, mass and lump, right lower limb: Secondary | ICD-10-CM

## 2017-08-12 DIAGNOSIS — M7989 Other specified soft tissue disorders: Secondary | ICD-10-CM

## 2017-08-12 DIAGNOSIS — S91301A Unspecified open wound, right foot, initial encounter: Secondary | ICD-10-CM

## 2017-08-12 MED ORDER — ALPRAZOLAM 0.25 MG PO TABS: 0.2500 mg | ORAL_TABLET | Freq: Every evening | ORAL | 0 refills | Status: DC | PRN

## 2017-08-12 MED ORDER — SULFAMETHOXAZOLE-TRIMETHOPRIM 800-160 MG PO TABS: 2.0000 | ORAL_TABLET | Freq: Two times a day (BID) | ORAL | 0 refills | Status: AC

## 2017-08-12 NOTE — Telephone Encounter (Signed)
Copied from [place] [id number]. Topic: Appointment Scheduling - Scheduling Inquiry for Clinic >> Aug 12, 2017 10:59 AM Bea Graff, NT wrote: Reason for CRM: Pt is calling to see if he can make an appt with this office for an infection in his leg. Pt was dismissed from the practice in 2013 and he states that it was questionable as to why the doctor dismissed him from the practice. He states his wife is a patient with [facility] and he would like to re-establish care with [facility]. He would like a call to see if he can have an appointment.  >> Aug 12, 2017  2:39 PM Peace, Tammy L wrote: Spoke with patient in regard.

## 2017-08-12 NOTE — ED Provider Notes (Addendum)
[place]    CSN: [id number] Arrival date & time: 08/12/17  1455     History   Chief Complaint Chief Complaint  Patient presents with  . Leg Swelling    HPI [name] is a 67 y.o. male.     68 year old male with history of A. fib, hypertension, gout, neuropathy and degenerative disc disease that presents with an 11-day history of right heel wound. Patient states that this started off as a "crack" that has gradually worsened. He has used over-the-counter antibiotic cream and has scrubbed it with peroxide which has not helped. Patient states that the wound is sore. Denies any drainage or odor. He also reports right leg swelling and mild tenderness to the right calf. No recent travel or prolonged immobility. No history of active malignancy, recent surgeries or history of DVT. No fevers, sweats or chills. Patient states that he is under a lot of stress as his primary caregiver of his wife who is currently being treated for cancer.        Past Medical History:  Diagnosis Date  . Atrial fibrillation (HCC)    metoprolol  . DDD (degenerative disc disease)   . Gout   . HLD (hyperlipidemia)   . Hx of adenomatous colonic polyps 07/25/2015  . Hypertension   . Lumbago    (low back pain)   . Neuropathy   . S/P CABG x 1   . Sciatica     Patient Active Problem List   Diagnosis Date Noted  . Hx of adenomatous colonic polyps 07/25/2015  . Chest pain 01/05/2014  . Coronary artery disease 02/28/2011  . Glucose intolerance (pre-diabetes) 02/28/2011  . ALCOHOL ABUSE 11/21/2009  . WOUND, FOOT 02/11/2009  . VENOUS INSUFFICIENCY, CHRONIC, RIGHT LEG 06/07/2008  . HYPERLIPIDEMIA 03/11/2007  . HYPERTENSION 03/11/2007  . LOW BACK PAIN 03/11/2007    Past Surgical History:  Procedure Laterality Date  . CARDIAC CATHETERIZATION  4.17.09  . CARDIOVERSION N/A 11/03/2013   Procedure: CARDIOVERSION;  Surgeon: Dorothy Spark, MD;  Location: [facility] ENDOSCOPY;  Service:  Cardiovascular;  Laterality: N/A;  . COLONOSCOPY  10-01-2002   gessner-tics and hems   . CORONARY ARTERY BYPASS GRAFT  5.8.09   x3  . INGUINAL HERNIA REPAIR Left   . LAPAROSCOPIC INGUINAL HERNIA REPAIR Right   . TEE WITHOUT CARDIOVERSION N/A 11/03/2013   Procedure: TRANSESOPHAGEAL ECHOCARDIOGRAM (TEE);  Surgeon: Dorothy Spark, MD;  Location: [facility];  Service: Cardiovascular;  Laterality: N/A;  . TONSILLECTOMY AND ADENOIDECTOMY    . WRIST SURGERY         Home Medications    Prior to Admission medications   Medication Sig Start Date End Date Taking? Authorizing Provider  allopurinol (ZYLOPRIM) 100 MG tablet Take 100 mg by mouth as needed (for gout). Reported on 07/11/2015    [provider]  celecoxib (CELEBREX) 50 MG capsule Take 50 mg by mouth daily.    [provider]  diltiazem (CARTIA XT) 120 MG 24 hr capsule Take 1 capsule (120 mg total) by mouth daily. 09/01/15   Dorothy Spark, MD  HYDROcodone-acetaminophen (NORCO) 10-325 MG tablet Take 1 tablet by mouth every 6 (six) hours as needed. 08/04/15   [provider]  lisinopril (PRINIVIL,ZESTRIL) 20 MG tablet Take 1 tablet (20 mg total) by mouth daily. 09/01/15   Dorothy Spark, MD  methocarbamol (ROBAXIN) 500 MG tablet Take 1 tablet by mouth 3 (three) times daily as needed. 08/03/15   [provider]  metoprolol succinate (TOPROL-XL) 100 MG 24 hr tablet Take 1 tablet (100 mg total) by mouth daily. Take with or immediately following a meal. 09/01/15   Dorothy Spark, MD  nitroGLYCERIN (NITROSTAT) 0.4 MG SL tablet place 1 tablet under the tongue every 5 minutes if needed for chest pain 08/28/15   Dorothy Spark, MD  pravastatin (PRAVACHOL) 20 MG tablet Take 1 tablet (20 mg total) by mouth every evening. 09/01/15   Dorothy Spark, MD    Family History Family History  Problem Relation Age of Onset  . Coronary artery disease Unknown        family history  . Heart disease Father         CABG   . Heart attack Father   . Stroke Unknown        family history  . Colon cancer Neg Hx   . Colon polyps Neg Hx   . Esophageal cancer Neg Hx   . Rectal cancer Neg Hx   . Stomach cancer Neg Hx     Social History Social History   Tobacco Use  . Smoking status: Never Smoker  . Smokeless tobacco: Never Used  Substance Use Topics  . Alcohol use: Yes    Alcohol/week: 0.0 oz    Comment: 6 beers per week  . Drug use: No     Allergies   Compazine   Review of Systems Review of Systems  Constitutional: Negative for chills, diaphoresis, fatigue and fever.  Respiratory: Negative for chest tightness and shortness of breath.   Cardiovascular: Negative for chest pain.  Skin: Positive for wound.  Psychiatric/Behavioral: The patient is nervous/anxious.   All other systems reviewed and are negative.    Physical Exam Triage Vital Signs ED Triage Vitals  Enc Vitals Group     BP 08/12/17 1649 (!) 175/89     Pulse Rate 08/12/17 1649 85     Resp 08/12/17 1649 18     Temp --      Temp src --      SpO2 08/12/17 1649 100 %     Weight --      Height --      Head Circumference --      Peak Flow --      Pain Score 08/12/17 1647 5     Pain Loc --      Pain Edu? --      Excl. in [place]? --    No data found.  Updated Vital Signs BP (!) 175/89   Pulse 85   Resp 18   SpO2 100%   Visual Acuity Right Eye Distance:   Left Eye Distance:   Bilateral Distance:    Right Eye Near:   Left Eye Near:    Bilateral Near:     Physical Exam  Constitutional: He is oriented to person, place, and time. He appears well-developed and well-nourished.  Neck: Normal range of motion. Neck supple.  Cardiovascular: Normal rate and regular rhythm.  Pulmonary/Chest: Effort normal and breath sounds normal.  Musculoskeletal: Normal range of motion.  Right lower leg redness and swelling. No pitting edema. Mild calf tenderness.   Neurological: He is alert and oriented to person, place, and time.    Skin: Skin is warm and dry.  2 cm x 2 cm ulceration to the right heel with minimal surrounding erythema. No active drainage or odor. No streaking noted.      UC Treatments / Results  Labs (all labs ordered are  listed, but only abnormal results are displayed) Labs Reviewed - No data to display  EKG  EKG Interpretation None       Radiology No results found.  Procedures Procedures (including critical care time)  Medications Ordered in UC Medications - No data to display   Initial Impression / Assessment and Plan / UC Course  I have reviewed the triage vital signs and the nursing notes.  Pertinent labs & imaging results that were available during my care of the patient were reviewed by me and considered in my medical decision making (see chart for details).    67 year old male with history of atrial fibrillation, hypertension, gout, neuropathy and degenerative disc disease or presents with an 11 day history of worsening right heel wound, lower extremity swelling and right calf tenderness. No recent travel or prolonged immobility, no active malignancy, no recent surgeries, no history of DVT. No fevers, sweats or chills. Wells Score for DVT 1 = low probability for DVT. Will start patient with Bactrim 2 tablets twice a day 10 days. Will also provide short course of Xanax per patient's request. Patient advised that he needs to establish primary care as well as follow-up with the wound clinic. Strict return precautions discussed with patient. Discussed diagnosis and treatment with patient. All questions have been answered and all concerns have been addressed. The patient verbalized understanding and had no further questions    Final Clinical Impressions(s) / UC Diagnoses   Final diagnoses:  Open wound of heel, right, initial encounter  Right leg swelling  Anxiety    ED Discharge Orders    None       Controlled Substance Prescriptions [place] Controlled Substance Registry  consulted? Yes, I have consulted the [place] Controlled Substances Registry for this patient, and feel the risk/benefit ratio today is favorable for proceeding with this prescription for a controlled substance.   Enrique Sack, [place] 08/12/17 [place], [place], [place] 08/12/17 1758

## 2017-08-12 NOTE — ED Triage Notes (Signed)
Pt here for 2 weeks or RLE swelling and pain after he cut right heel. Using ointment but not healing.

## 2017-08-12 NOTE — Telephone Encounter (Signed)
I phoned patient to notify that unfortunately we would not be able to establish care after he has already been discharged from a [facility] office.  Patient stressed he needed to be seen for the infection in his leg.  I did advise to patient that he would need to go to the urgent care or ED to get this evaluated as soon as possible.  Patient still was not listening to what I was communicating and insisted again that he needed to be seen at our office for the infection in his leg.  I did advise to patient that I would get our office manager to follow up with him in regard to establishing care again (while also communicating to patient that our first new patient appointments were around July).  I did advise again that patient should visit the ED or Urgent care and not wait to be seen.

## 2017-08-14 NOTE — Telephone Encounter (Signed)
Confirmed that patient did seek treatment for his leg at our UC on 08/12/17. Patient is not interested in seeking treatment at [facility] or having a conversation with them about reconsidering his dismissal. Patient has been offered other Primary Care providers.

## 2017-08-17 ENCOUNTER — Encounter: Payer: Self-pay | Admitting: Physician Assistant

## 2017-08-17 NOTE — Progress Notes (Deleted)
Cardiology Office Note    Date:  08/17/2017  ID:  [name], DOB [birth year], MRN [id number] PCP:  Patient, No Pcp Per  Cardiologist:  Dr. Meda Coffee   Chief Complaint: overdue followup  History of Present Illness:  [name] is a 67 y.o. male with history of CAD s/p CABG 2009, PAF, paroxysmal atrial flutter. EtOH abuse, HLD, impaired glucose tolernace, lumbago, gout, neuropathy, scitatica, leg varicosities who presents for overdue follow-up. To recap history, last nuclear stress test was 08/2015 and normal, EF 64%. He also has a history of PAF/flutter iwth cardioversion in 2015. He was previously recommended to use Xarelto but patient stopped it on his own. Given noncompliance and heavy alcohol, Dr. Meda Coffee advised against restarting it. TEE prior to [place] 2015 showed EF 50-55%, mild MR, dilated LA. Last labs 2017 showed LDL 167, trig 358, AST 36, ALT 33, normal LFTs otherwise, normal CBC, Na 133, K 4.4, glucose 155, Cr 0.85.   Paroxysmal atrial fib/flutter CAD Hyperlipidemia Alcohol abuse    Past Medical History:  Diagnosis Date  . Alcohol abuse   . CAD in native artery    a. s/p CABG 2009.  . DDD (degenerative disc disease)   . Gout   . HLD (hyperlipidemia)   . Hx of adenomatous colonic polyps 07/25/2015  . Hyperglycemia   . Hypertension   . Lumbago    (low back pain)   . Neuropathy   . PAF (paroxysmal atrial fibrillation) ([place])   . Paroxysmal atrial flutter ([place])   . S/P CABG x 1   . Sciatica     Past Surgical History:  Procedure Laterality Date  . CARDIAC CATHETERIZATION  4.17.09  . CARDIOVERSION N/A 11/03/2013   Procedure: CARDIOVERSION;  Surgeon: Dorothy Spark, MD;  Location: [facility] ENDOSCOPY;  Service: Cardiovascular;  Laterality: N/A;  . COLONOSCOPY  10-01-2002   gessner-tics and hems   . CORONARY ARTERY BYPASS GRAFT  5.8.09   x3  . INGUINAL HERNIA REPAIR Left   . LAPAROSCOPIC INGUINAL HERNIA REPAIR Right   . TEE WITHOUT CARDIOVERSION N/A 11/03/2013   Procedure: TRANSESOPHAGEAL ECHOCARDIOGRAM (TEE);  Surgeon: Dorothy Spark, MD;  Location: [place];  Service: Cardiovascular;  Laterality: N/A;  . TONSILLECTOMY AND ADENOIDECTOMY    . WRIST SURGERY      Current Medications: No outpatient medications have been marked as taking for the 08/18/17 encounter (Appointment) with Charlie Pitter, PA-C.   ***   Allergies:   Compazine   Social History   Socioeconomic History  . Marital status: Married    Spouse name: Not on file  . Number of children: Not on file  . Years of education: Not on file  . Highest education level: Not on file  Social Needs  . Financial resource strain: Not on file  . Food insecurity - worry: Not on file  . Food insecurity - inability: Not on file  . Transportation needs - medical: Not on file  . Transportation needs - non-medical: Not on file  Occupational History  . Not on file  Tobacco Use  . Smoking status: Never Smoker  . Smokeless tobacco: Never Used  Substance and Sexual Activity  . Alcohol use: Yes    Alcohol/week: 0.0 oz    Comment: 6 beers per week  . Drug use: No  . Sexual activity: Not on file  Other Topics Concern  . Not on file  Social History Narrative  . Not on file     Family History:  Family History  Problem Relation Age of Onset  . Coronary artery disease Unknown        family history  . Heart disease Father        CABG   . Heart attack Father   . Stroke Unknown        family history  . Colon cancer Neg Hx   . Colon polyps Neg Hx   . Esophageal cancer Neg Hx   . Rectal cancer Neg Hx   . Stomach cancer Neg Hx    ***  ROS:   Please see the history of present illness. Otherwise, review of systems is positive for ***.  All other systems are reviewed and otherwise negative.    PHYSICAL EXAM:   VS:  There were no vitals taken for this visit.  BMI: There is no height or weight on file to calculate BMI. GEN: Well nourished, well developed, in no acute distress  HEENT:  normocephalic, atraumatic Neck: no JVD, carotid bruits, or masses Cardiac: ***RRR; no murmurs, rubs, or gallops, no edema  Respiratory:  clear to auscultation bilaterally, normal work of breathing GI: soft, nontender, nondistended, + BS MS: no deformity or atrophy  Skin: warm and dry, no rash Neuro:  Alert and Oriented x 3, Strength and sensation are intact, follows commands Psych: euthymic mood, full affect  Wt Readings from Last 3 Encounters:  08/31/15 210 lb (95.3 kg)  08/28/15 210 lb (95.3 kg)  07/25/15 212 lb (96.2 kg)      Studies/Labs Reviewed:   EKG:  EKG was ordered today and personally reviewed by me and demonstrates *** EKG was not ordered today.***  Recent Labs: No results found for requested labs within last 8760 hours.   Lipid Panel    Component Value Date/Time   CHOL 304 (H) 08/31/2015 0740   TRIG 358 (H) 08/31/2015 0740   TRIG 437 (HH) 06/16/2006 1036   HDL 65 08/31/2015 0740   CHOLHDL 4.7 08/31/2015 0740   VLDL 72 (H) 08/31/2015 0740   LDLCALC 167 (H) 08/31/2015 0740   LDLDIRECT 232.9 11/21/2009 0901    Additional studies/ records that were reviewed today include: Summarized above.***    ASSESSMENT & PLAN:   1. ***  Disposition: F/u with ***   Medication Adjustments/Labs and Tests Ordered: Current medicines are reviewed at length with the patient today.  Concerns regarding medicines are outlined above. Medication changes, Labs and Tests ordered today are summarized above and listed in the Patient Instructions accessible in Encounters.   Signed, Charlie Pitter, PA-C  08/17/2017 10:56 AM    [address], [place], [place]  [address] Phone: [phone]; Fax: [phone]

## 2017-08-18 ENCOUNTER — Ambulatory Visit: Payer: Medicare Other | Admitting: Physician Assistant

## 2017-08-18 ENCOUNTER — Encounter: Payer: Self-pay | Admitting: Physician Assistant

## 2017-08-18 VITALS — BP 110/64 | HR 75 | Ht 74.0 in | Wt 212.8 lb

## 2017-08-18 DIAGNOSIS — I10 Essential (primary) hypertension: Secondary | ICD-10-CM

## 2017-08-18 DIAGNOSIS — I779 Disorder of arteries and arterioles, unspecified: Secondary | ICD-10-CM

## 2017-08-18 DIAGNOSIS — I2510 Atherosclerotic heart disease of native coronary artery without angina pectoris: Secondary | ICD-10-CM

## 2017-08-18 DIAGNOSIS — E785 Hyperlipidemia, unspecified: Secondary | ICD-10-CM

## 2017-08-18 DIAGNOSIS — I739 Peripheral vascular disease, unspecified: Secondary | ICD-10-CM

## 2017-08-18 DIAGNOSIS — M7989 Other specified soft tissue disorders: Secondary | ICD-10-CM

## 2017-08-18 DIAGNOSIS — I480 Paroxysmal atrial fibrillation: Secondary | ICD-10-CM | POA: Diagnosis not present

## 2017-08-18 MED ORDER — LISINOPRIL 20 MG PO TABS: 20.0000 mg | ORAL_TABLET | Freq: Every day | ORAL | 3 refills | Status: AC

## 2017-08-18 MED ORDER — DILTIAZEM HCL ER COATED BEADS 120 MG PO CP24: 120.0000 mg | ORAL_CAPSULE | Freq: Every day | ORAL | 3 refills | Status: DC

## 2017-08-18 MED ORDER — PRAVASTATIN SODIUM 20 MG PO TABS: 20.0000 mg | ORAL_TABLET | Freq: Every evening | ORAL | 3 refills | Status: AC

## 2017-08-18 MED ORDER — NITROGLYCERIN 0.4 MG SL SUBL: SUBLINGUAL_TABLET | SUBLINGUAL | 3 refills | Status: DC

## 2017-08-18 MED ORDER — METOPROLOL SUCCINATE ER 100 MG PO TB24: 100.0000 mg | ORAL_TABLET | Freq: Every day | ORAL | 3 refills | Status: DC

## 2017-08-18 NOTE — Patient Instructions (Addendum)
Medication Instructions:  RESTART ASPIRIN 81 MG DAILY  CONTINUE WITH YOUR CURRENT MEDICAITONS  Labwork: CMET, FASTING LIPIDS TO BE DONE 1 WEEK BEFORE YOUR APPT WITH DR. Meda Coffee IN 3 MONTHS  Testing/Procedures: Your physician has requested that you have a carotid duplex. This test is an ultrasound of the carotid arteries in your neck. It looks at blood flow through these arteries that supply the brain with blood. Allow one hour for this exam. There are no restrictions or special instructions.  Your physician has requested that you have a lower extremity venous duplex TO R/O DVT. This test is an ultrasound of the veins in the legs or arms. It looks at venous blood flow that carries blood from the heart to the legs or arms. Allow one hour for a Lower Venous exam. Allow thirty minutes for an Upper Venous exam. There are no restrictions or special instructions.    Follow-Up: DR. Meda Coffee IN 3 MONTHS   YOU ARE BEING REFERRED TO VASCULAR SURGERY FOR FURTHER EVALUATION; A REFERRAL HAS BEEN PLACED ; THEIR OFFICE WILL CALL YOU WITH AN APPT.   Any Other Special Instructions Will Be Listed Below (If Applicable).     If you need a refill on your cardiac medications before your next appointment, please call your pharmacy.

## 2017-08-18 NOTE — Progress Notes (Signed)
Cardiology Office Note:    Date:  08/18/2017   ID:  AZARI HASLER, DOB [birth year], MRN [id number]  PCP:  Patient, No Pcp Per  Cardiologist:  Ena Dawley, MD   Referring MD: No ref. provider found   Chief Complaint  Patient presents with  . Leg Swelling    History of Present Illness:    GEAN LAROSE is a 67 y.o. male with coronary artery disease s/p coronary artery bypass grafting in 2009, postoperative atrial fibrillation, diabetes (diet controlled), hypertension, hyperlipidemia.  He was previously followed by Dr. Jenell Milliner.  He established with Dr. Ena Dawley in 6/15 and was noted to be in Atrial Flutter.  He was placed on Rivaroxaban for anticoagulation and underwent TEE guided cardioversion.  He was last seen by in 08/2015.  He was no longer on Rivaroxaban at that time for unclear reasons.  It was felt his risk of bleeding was too great in the setting of heavy alcohol use and he was not placed back on anticoagulation.  A follow up Nuclear stress test in 08/2015 was negative for ischemia.    CHADS2-VASc=3 (HTN, DM, CAD).     Mr. Lieske returns for evaluation of lower extremity swelling.  He has a history of lower extremity swelling.  He injured his right heel recently.  He sees the wound clinic soon.  Since the injury, he has noted swelling in his legs.  His right leg is worse.  He notes some tenderness in his calves, especially on the right.  He denies chest pain, syncope, shortness of breath, orthopnea, PND.  His swelling does improve when lying supine.  It worsens when standing for prolonged periods of time.  He denies any bleeding issues.  Prior CV studies:   The following studies were reviewed today:  Nuclear stress test 08/31/15 No significant reversible ischemia. LVEF 64% with normal wall motion. This is a low risk study.   Nuclear stress test 01/28/14 No ischemia, EF 65, normal study  TEE 11/03/13 EF 50-55, no RWMA, mild MR, LAE, no LAA clot  Cardiac  Catheterization 09/18/07 LAD prox 40; D1 50 LCx prox 70-80, mid 40, distal 80 RCA 100 EF 60   Past Medical History:  Diagnosis Date  . Alcohol abuse   . CAD (coronary artery disease)    a. s/p CABG 2009 (Dr. Roxy Manns - LIMA-OM2, L radial - PDA, SVG-Dx) // b. Nuc 8/15: No ischemia, EF 65, normal study // c. Nuc 3/17: No significant reversible ischemia. LVEF 64% with normal wall motion. This is a low risk study.   . DDD (degenerative disc disease)   . Gout   . HLD (hyperlipidemia)   . Hx of adenomatous colonic polyps 07/25/2015  . Hyperglycemia   . Hypertension   . Lumbago    (low back pain)   . Neuropathy   . PAF (paroxysmal atrial fibrillation) ([place])    Anticoag (previously on Xarelto) stopped in 2017 due to concerns over bleeding risk in the setting of heavy alcohol use)  . Paroxysmal atrial flutter ([place])    TEE 6/15: EF 50-55, no RWMA, mild MR, LAE, no LAA clot >> s/p DCCV to restore NSR  . S/P CABG x 1   . Sciatica    Surgical Hx: The patient  has a past surgical history that includes Inguinal hernia repair (Left); Wrist surgery; Coronary artery bypass graft (5.8.09); Laparoscopic inguinal hernia repair (Right); Cardiac catheterization (4.17.09); Tonsillectomy and adenoidectomy; TEE without cardioversion (N/A, 11/03/2013); Cardioversion (N/A, 11/03/2013); and  Colonoscopy (10-01-2002).   Current Medications: Current Meds  Medication Sig  . ALPRAZolam (XANAX) 0.25 MG tablet Take 1 tablet (0.25 mg total) by mouth at bedtime as needed for anxiety.  . celecoxib (CELEBREX) 50 MG capsule Take 50 mg by mouth daily.  Marland Kitchen diltiazem (CARTIA XT) 120 MG 24 hr capsule Take 1 capsule (120 mg total) by mouth daily.  Marland Kitchen HYDROcodone-acetaminophen (NORCO) 10-325 MG tablet Take 1 tablet by mouth every 6 (six) hours as needed for moderate pain.   Marland Kitchen lisinopril (PRINIVIL,ZESTRIL) 20 MG tablet Take 1 tablet (20 mg total) by mouth daily.  . methocarbamol (ROBAXIN) 500 MG tablet Take 1 tablet by mouth 3 (three)  times daily as needed for muscle spasms.   . metoprolol succinate (TOPROL-XL) 100 MG 24 hr tablet Take 1 tablet (100 mg total) by mouth daily. Take with or immediately following a meal.  . nitroGLYCERIN (NITROSTAT) 0.4 MG SL tablet place 1 tablet under the tongue every 5 minutes if needed for chest pain  . pravastatin (PRAVACHOL) 20 MG tablet Take 1 tablet (20 mg total) by mouth every evening.  . sulfamethoxazole-trimethoprim (BACTRIM DS,SEPTRA DS) 800-160 MG tablet Take 2 tablets by mouth 2 (two) times daily for 10 days.     Allergies:   Compazine   Social History   Tobacco Use  . Smoking status: Never Smoker  . Smokeless tobacco: Never Used  Substance Use Topics  . Alcohol use: Yes    Alcohol/week: 0.0 oz    Comment: 6 beers per week  . Drug use: No     Family Hx: The patient's family history includes Coronary artery disease in his unknown relative; Heart attack in his father; Heart disease in his father; Stroke in his unknown relative. There is no history of Colon cancer, Colon polyps, Esophageal cancer, Rectal cancer, or Stomach cancer.  ROS:   Please see the history of present illness.    ROS All other systems reviewed and are negative.   EKGs/Labs/Other Test Reviewed:    EKG:  EKG is  ordered today.  The ekg ordered today demonstrates normal sinus rhythm, heart rate 75, normal axis, QTC 451, no significant change compared to prior tracing  Recent Labs: No results found for requested labs within last 8760 hours.   Recent Lipid Panel Lab Results  Component Value Date/Time   CHOL 304 (H) 08/31/2015 07:40 AM   TRIG 358 (H) 08/31/2015 07:40 AM   TRIG 437 (HH) 06/16/2006 10:36 AM   HDL 65 08/31/2015 07:40 AM   CHOLHDL 4.7 08/31/2015 07:40 AM   LDLCALC 167 (H) 08/31/2015 07:40 AM   LDLDIRECT 232.9 11/21/2009 09:01 AM    Physical Exam:    VS:  BP 110/64   Pulse 75   Ht 6\' 2"  (1.88 m)   Wt 212 lb 12.8 oz (96.5 kg)   BMI 27.32 kg/m     Wt Readings from Last 3  Encounters:  08/18/17 212 lb 12.8 oz (96.5 kg)  08/31/15 210 lb (95.3 kg)  08/28/15 210 lb (95.3 kg)     Physical Exam  Constitutional: He is oriented to person, place, and time. He appears well-developed and well-nourished. No distress.  HENT:  Head: Normocephalic and atraumatic.  Neck: No JVD present.  Cardiovascular: Normal rate and regular rhythm.  No murmur heard. Pulmonary/Chest: Effort normal. He has no rales.  Abdominal: Soft.  Musculoskeletal: He exhibits edema.  Bilateral brawny pitting edema (R >L) with chronic stasis changes; right calf somewhat tender to palpation; multiple  varicosities noted bilaterally  Neurological: He is alert and oriented to person, place, and time.  Skin: Skin is warm and dry.    ASSESSMENT & PLAN:    #1.  Leg swelling He presents today for evaluation of bilateral lower extremity swelling.  His right leg is worse than his left.  This all started shortly after he suffered a wound to his right foot.  He sees the wound clinic sometime in the next week.  The swelling does improve after his legs are elevated and gets worse throughout the day.  He has had issues with swelling in the past due to venous insufficiency.  He is most worried about a DVT.  He tells me that he had a "clot" in his leg many years ago.  As he does have some tenderness of his calf and has had a recent injury, I will arrange bilateral venous duplex to rule out DVT.  I will also refer him back to vascular surgery for further management of his venous insufficiency (referral made 2 years ago but he never went for evaluation)  #2.  Left-sided carotid artery disease, unspecified type ([place]) He had mild plaque on his carotid ultrasound prior to his bypass surgery in 2009.  He has not had follow-up Dopplers and is concerned about worsening stenosis.  -Arrange carotid US  #3.  Coronary artery disease involving native coronary artery of native heart without angina pectoris Status post CABG in  2009.  Low risk Myoview 2017.  He denies anginal symptoms.  Continue beta-blocker, statin.  Resume aspirin 81 mg daily.  #4.  Paroxysmal atrial fibrillation ([place]) He is status post cardioversion for atrial flutter in 2015.  He is currently maintaining sinus rhythm.  Dr. Meda Coffee took him off of anticoagulation when last seen in 2017 due to concerns over bleeding risk in the setting of heavy alcohol use.  #5.  Hyperlipidemia, unspecified hyperlipidemia type Continue statin.  Arrange follow-up fasting CMET, lipids prior to next visit.  #6.  Essential hypertension The patient's blood pressure is controlled on his current regimen.  Continue current therapy.    Dispo:  Return in about 3 months (around 11/18/2017) for Routine Follow Up w/ Dr. Meda Coffee.   Medication Adjustments/Labs and Tests Ordered: Current medicines are reviewed at length with the patient today.  Concerns regarding medicines are outlined above.  Tests Ordered: No orders of the defined types were placed in this encounter.  Medication Changes: No orders of the defined types were placed in this encounter.   Signed, Richardson Dopp, PA-C  08/18/2017 10:46 AM    [facility] [address], [place], [place]  [address] Phone: [phone]; Fax: [phone]

## 2017-08-21 ENCOUNTER — Ambulatory Visit (HOSPITAL_COMMUNITY)
Admission: RE | Admit: 2017-08-21 | Discharge: 2017-08-21 | Disposition: A | Discharge status: Home / Self Care | Payer: Medicare Other | Source: Ambulatory Visit | Attending: Cardiovascular Disease | Admitting: Cardiovascular Disease

## 2017-08-21 ENCOUNTER — Ambulatory Visit (HOSPITAL_BASED_OUTPATIENT_CLINIC_OR_DEPARTMENT_OTHER)
Admission: RE | Admit: 2017-08-21 | Discharge: 2017-08-21 | Disposition: A | Discharge status: Home / Self Care | Payer: Medicare Other | Source: Ambulatory Visit | Attending: Physician Assistant | Admitting: Physician Assistant

## 2017-08-21 DIAGNOSIS — M7989 Other specified soft tissue disorders: Secondary | ICD-10-CM

## 2017-08-21 DIAGNOSIS — E785 Hyperlipidemia, unspecified: Secondary | ICD-10-CM | POA: Diagnosis not present

## 2017-08-21 DIAGNOSIS — I779 Disorder of arteries and arterioles, unspecified: Secondary | ICD-10-CM | POA: Insufficient documentation

## 2017-08-21 DIAGNOSIS — I739 Peripheral vascular disease, unspecified: Principal | ICD-10-CM

## 2017-08-21 DIAGNOSIS — I10 Essential (primary) hypertension: Secondary | ICD-10-CM | POA: Diagnosis not present

## 2017-08-21 DIAGNOSIS — I2510 Atherosclerotic heart disease of native coronary artery without angina pectoris: Secondary | ICD-10-CM | POA: Insufficient documentation

## 2017-08-22 ENCOUNTER — Encounter (HOSPITAL_BASED_OUTPATIENT_CLINIC_OR_DEPARTMENT_OTHER): Payer: Medicare Other | Attending: Internal Medicine

## 2017-08-22 ENCOUNTER — Telehealth: Payer: Self-pay | Admitting: *Deleted

## 2017-08-22 ENCOUNTER — Encounter: Payer: Self-pay | Admitting: Physician Assistant

## 2017-08-22 DIAGNOSIS — L97412 Non-pressure chronic ulcer of right heel and midfoot with fat layer exposed: Secondary | ICD-10-CM | POA: Diagnosis not present

## 2017-08-22 DIAGNOSIS — I2510 Atherosclerotic heart disease of native coronary artery without angina pectoris: Secondary | ICD-10-CM | POA: Diagnosis not present

## 2017-08-22 DIAGNOSIS — E1140 Type 2 diabetes mellitus with diabetic neuropathy, unspecified: Secondary | ICD-10-CM | POA: Insufficient documentation

## 2017-08-22 DIAGNOSIS — L97421 Non-pressure chronic ulcer of left heel and midfoot limited to breakdown of skin: Secondary | ICD-10-CM | POA: Diagnosis not present

## 2017-08-22 DIAGNOSIS — Z951 Presence of aortocoronary bypass graft: Secondary | ICD-10-CM | POA: Insufficient documentation

## 2017-08-22 DIAGNOSIS — M7989 Other specified soft tissue disorders: Secondary | ICD-10-CM

## 2017-08-22 DIAGNOSIS — I739 Peripheral vascular disease, unspecified: Principal | ICD-10-CM

## 2017-08-22 DIAGNOSIS — L97512 Non-pressure chronic ulcer of other part of right foot with fat layer exposed: Secondary | ICD-10-CM | POA: Insufficient documentation

## 2017-08-22 DIAGNOSIS — I779 Disorder of arteries and arterioles, unspecified: Secondary | ICD-10-CM

## 2017-08-22 NOTE — Telephone Encounter (Signed)
Tried to reach pt to go over Carotid results. VM is full could not lmom.

## 2017-08-22 NOTE — Telephone Encounter (Signed)
-----   Message from Liliane Shi, PA-C sent at 08/22/2017  8:00 AM EDT ----- Please call the patient. The carotid ultrasound demonstrates just mild plaque bilaterally.  Continue current therapy. Repeat 2 years. Please fax a copy of this study result to his PCP:  Patient, No Pcp Per  Thanks! Richardson Dopp, PA-C    08/22/2017 7:56 AM

## 2017-08-25 ENCOUNTER — Telehealth: Payer: Self-pay

## 2017-08-25 DIAGNOSIS — I739 Peripheral vascular disease, unspecified: Secondary | ICD-10-CM

## 2017-08-25 NOTE — Telephone Encounter (Signed)
-----   Message from Liliane Shi, [place] sent at 08/25/2017  8:48 AM EDT ----- Please call the patient. The venous ultrasound shows no evidence of a DVT.  However, he does have abnormal arterial flow in both lower legs.  Also, the lymph nodes in his bilateral groins are enlarged.  This may be from the wound on his foot, but it needs evaluation. PLAN:  1. Please arrange ABIs and lower ext arterial dopplers to rule out peripheral arterial disease.  2. He needs further evaluation of the inguinal lymphadenopathy.  He should be seen by primary care in the next 2 weeks for evaluation.  Please fax report to his PCP.  Please route a copy of this study result to his PCP:  Patient, No Pcp Per  Richardson Dopp, PA-C    08/25/2017 8:34 AM

## 2017-08-25 NOTE — Telephone Encounter (Signed)
Left message for patient to call back  

## 2017-08-26 NOTE — Telephone Encounter (Signed)
Tried to reach pt to go over test results. Phone rang about 8 times or so then went to a busy signal. Could not lmom.

## 2017-08-26 NOTE — Telephone Encounter (Signed)
-----   Message from Liliane Shi, PA-C sent at 08/22/2017  8:00 AM EDT ----- Please call the patient. The carotid ultrasound demonstrates just mild plaque bilaterally.  Continue current therapy. Repeat 2 years. Please fax a copy of this study result to his PCP:  Patient, No Pcp Per  Thanks! Richardson Dopp, PA-C    08/22/2017 7:56 AM

## 2017-08-29 NOTE — Telephone Encounter (Signed)
-----   Message from Liliane Shi, PA-C sent at 08/22/2017  8:00 AM EDT ----- Please call the patient. The carotid ultrasound demonstrates just mild plaque bilaterally.  Continue current therapy. Repeat 2 years. Please fax a copy of this study result to his PCP:  Patient, No Pcp Per  Thanks! Richardson Dopp, PA-C    08/22/2017 7:56 AM

## 2017-08-29 NOTE — Telephone Encounter (Signed)
Pt has been notified today of both carotids and venous dopplers test results and findings. Pt has been made aware of recommendation to have LEA with ABI's. Per Brynda Rim. PA pt does have abnormal arterial flow in both lower legs. Also, the lymph nodes in his bilateral groins are enlarged. This may be from the wound on his foot, but it needs evaluation. He needs further evaluation of the inguinal lymphadenopathy.  He should be seen by primary care in the next 2 weeks for evaluation. Pt agreeable to LEA w/ABI's. Pt states he has not yet set up with a new PCP. Pt did want it noted that he will get LEA w/ABI's done as soon as he can, however his wife is battling cancer right now and is having a very hard time, states his wife is not doing well. Pt states he needs to care for his wife first and wants to be with her. Pt wants this noted so that Dr. Meda Coffee and Richardson Dopp, PA are aware. I assured pt I will note and that both providers will be made aware. Pt does have an appt with the [facility]ound Clinic on 09/01/17 @ 8:30 am. Pt thanked me for the call.

## 2017-09-01 ENCOUNTER — Encounter (HOSPITAL_BASED_OUTPATIENT_CLINIC_OR_DEPARTMENT_OTHER): Payer: Medicare Other

## 2017-10-28 ENCOUNTER — Other Ambulatory Visit: Payer: Self-pay

## 2017-10-28 DIAGNOSIS — M7989 Other specified soft tissue disorders: Secondary | ICD-10-CM

## 2017-11-03 ENCOUNTER — Encounter: Payer: Self-pay | Admitting: Surgery

## 2017-11-03 ENCOUNTER — Ambulatory Visit (HOSPITAL_COMMUNITY)
Admission: RE | Admit: 2017-11-03 | Discharge: 2017-11-03 | Disposition: A | Discharge status: Home / Self Care | Payer: Medicare Other | Source: Ambulatory Visit | Attending: Surgery | Admitting: Surgery

## 2017-11-03 ENCOUNTER — Encounter

## 2017-11-03 ENCOUNTER — Ambulatory Visit: Payer: Medicare Other | Admitting: Surgery

## 2017-11-03 VITALS — BP 141/85 | HR 84 | Temp 97.2°F | Resp 18 | Ht 73.0 in | Wt 206.1 lb

## 2017-11-03 DIAGNOSIS — I739 Peripheral vascular disease, unspecified: Secondary | ICD-10-CM | POA: Insufficient documentation

## 2017-11-03 DIAGNOSIS — M7989 Other specified soft tissue disorders: Secondary | ICD-10-CM | POA: Diagnosis present

## 2017-11-03 DIAGNOSIS — R9439 Abnormal result of other cardiovascular function study: Secondary | ICD-10-CM | POA: Diagnosis not present

## 2017-11-03 NOTE — Progress Notes (Signed)
[facility]  Patient name: [name] MRN: [id number] DOB: [birth year] Sex: male   REQUESTING PROVIDER:    Richardson Dopp   REASON FOR CONSULT:    Leg swelling  HISTORY OF PRESENT ILLNESS:   [name] is a 67 y.o. male, who is referred today for evaluation of his leg pain.  Patient has been having chronic leg swelling.  He thinks he may have had a DVT in his late 33s.  This potentially was in the right leg.  He has bilateral swelling, right greater than left.  He is also noticed some darkening of the skin around his legs.  Elevation helps.  He does not wear compression stockings.  He does complain of some cramping with ambulation however most of his walking is limited by his back pain and sciatica.  Patient has a history of coronary artery disease, status post CABG in 2009.  He suffers from diet-controlled diabetes.  He takes a statin for hypercholesterolemia.  He has chronic low back pain.  PAST MEDICAL HISTORY    Past Medical History:  Diagnosis Date  . Alcohol abuse   . CAD (coronary artery disease)    a. s/p CABG 2009 (Dr. Roxy Manns - LIMA-OM2, L radial - PDA, SVG-Dx) // b. Nuc 8/15: No ischemia, EF 65, normal study // c. Nuc 3/17: No significant reversible ischemia. LVEF 64% with normal wall motion. This is a low risk study.   . Carotid stenosis    Carotid US 3/19: Bilateral ICA 1-39  . DDD (degenerative disc disease)   . Gout   . HLD (hyperlipidemia)   . Hx of adenomatous colonic polyps 07/25/2015  . Hyperglycemia   . Hypertension   . Lumbago    (low back pain)   . Neuropathy   . PAF (paroxysmal atrial fibrillation) ([place])    Anticoag (previously on Xarelto) stopped in 2017 due to concerns over bleeding risk in the setting of heavy alcohol use)  . Paroxysmal atrial flutter ([place])    TEE 6/15: EF 50-55, no RWMA, mild MR, LAE, no LAA clot >> s/p DCCV to restore NSR  . S/P CABG x 1   . Sciatica       FAMILY HISTORY   Family History  Problem Relation Age of Onset  . Coronary artery disease Unknown        family history  . Heart disease Father        CABG   . Heart attack Father   . Stroke Unknown        family history  . Colon cancer Neg Hx   . Colon polyps Neg Hx   . Esophageal cancer Neg Hx   . Rectal cancer Neg Hx   . Stomach cancer Neg Hx     SOCIAL HISTORY:   Social History   Socioeconomic History  . Marital status: Married    Spouse name: Not on file  . Number of children: Not on file  . Years of education: Not on file  . Highest education level: Not on file  Occupational History  . Not on file  Social Needs  . Financial resource strain: Not on file  . Food insecurity:    Worry: Not on file    Inability: Not on file  . Transportation needs:    Medical: Not on file    Non-medical: Not on file  Tobacco Use  . Smoking status: Never Smoker  . Smokeless tobacco: Never Used  Substance  and Sexual Activity  . Alcohol use: Yes    Alcohol/week: 0.0 oz    Comment: 6 beers per week  . Drug use: No  . Sexual activity: Not on file  Lifestyle  . Physical activity:    Days per week: Not on file    Minutes per session: Not on file  . Stress: Not on file  Relationships  . Social connections:    Talks on phone: Not on file    Gets together: Not on file    Attends religious service: Not on file    Active member of club or organization: Not on file    Attends meetings of clubs or organizations: Not on file    Relationship status: Not on file  . Intimate partner violence:    Fear of current or ex partner: Not on file    Emotionally abused: Not on file    Physically abused: Not on file    Forced sexual activity: Not on file  Other Topics Concern  . Not on file  Social History Narrative  . Not on file    ALLERGIES:    Allergies  Allergen Reactions  . Compazine Other (See Comments)    A lockjaw-type reaction--face locked up     CURRENT  MEDICATIONS:    Current Outpatient Medications  Medication Sig Dispense Refill  . celecoxib (CELEBREX) 50 MG capsule Take 50 mg by mouth daily.    Marland Kitchen diltiazem (CARTIA XT) 120 MG 24 hr capsule Take 1 capsule (120 mg total) by mouth daily. 90 capsule 3  . lisinopril (PRINIVIL,ZESTRIL) 20 MG tablet Take 1 tablet (20 mg total) by mouth daily. 90 tablet 3  . methocarbamol (ROBAXIN) 500 MG tablet Take 1 tablet by mouth 3 (three) times daily as needed for muscle spasms.   0  . metoprolol succinate (TOPROL-XL) 100 MG 24 hr tablet Take 1 tablet (100 mg total) by mouth daily. Take with or immediately following a meal. 90 tablet 3  . nitroGLYCERIN (NITROSTAT) 0.4 MG SL tablet place 1 tablet under the tongue every 5 minutes if needed for chest pain 25 tablet 3  . pravastatin (PRAVACHOL) 20 MG tablet Take 1 tablet (20 mg total) by mouth every evening. 90 tablet 3   No current facility-administered medications for this visit.     REVIEW OF SYSTEMS:   [X]  denotes positive finding, [ ]  denotes negative finding Cardiac  Comments:  Chest pain or chest pressure:    Shortness of breath upon exertion:    Short of breath when lying flat:    Irregular heart rhythm:        Vascular    Pain in calf, thigh, or hip brought on by ambulation:    Pain in feet at night that wakes you up from your sleep:     Blood clot in your veins:    Leg swelling:  x       Pulmonary    Oxygen at home:    Productive cough:     Wheezing:         Neurologic    Sudden weakness in arms or legs:     Sudden numbness in arms or legs:     Sudden onset of difficulty speaking or slurred speech:    Temporary loss of vision in one eye:     Problems with dizziness:         Gastrointestinal    Blood in stool:      Vomited blood:  Genitourinary    Burning when urinating:     Blood in urine:        Psychiatric    Major depression:         Hematologic    Bleeding problems:    Problems with blood clotting too easily:         Skin    Rashes or ulcers:        Constitutional    Fever or chills:     PHYSICAL EXAM:   Vitals:   11/03/17 1517  BP: (!) 141/85  Pulse: 84  Resp: 18  Temp: (!) 97.2 F (36.2 C)  SpO2: 100%  Weight: 206 lb 1.6 oz (93.5 kg)  Height: 6\' 1"  (1.854 m)    GENERAL: The patient is a well-nourished male, in no acute distress. The vital signs are documented above. CARDIAC: There is a regular rate and rhythm.  VASCULAR: No carotid bruits.  Nonpalpable pedal pulses.  Palpable right radial pulse, nonpalpable left PULMONARY: Nonlabored respirations MUSCULOSKELETAL: There are no major deformities or cyanosis. NEUROLOGIC: No focal weakness or paresthesias are detected. SKIN: There are no ulcers or rashes noted. PSYCHIATRIC: The patient has a normal affect.  STUDIES:   ABIs were performed today.  0.6 on the right and 0.9 the left  ASSESSMENT and PLAN   Leg swelling: The patient looks like he had his saphenous vein harvested from the right leg.  Recent ultrasound was negative for DVT.  There do appear to be frozen valve leaflets on the right.  We have recommended getting him started in 2 compression therapy.  I will have a follow-up in 6 months for further evaluation see how he  is improving  Arterial insufficiency: The patient has a decreased ABI on the right.  His symptoms are limited by his back pain, and therefore I would only recommend a medical regimen at this time.  We discussed the importance of an exercise program.  I am not going to start him on Plavix as we will keep this available if his symptoms deteriorate.  Again the patient will follow-up with me in 6 weeks.   Annamarie Major, MD [facility] [phone] Pager [phone]

## 2017-11-10 ENCOUNTER — Encounter: Payer: Medicare Other | Admitting: Surgery

## 2017-11-10 ENCOUNTER — Encounter (HOSPITAL_COMMUNITY): Payer: Medicare Other

## 2017-11-14 ENCOUNTER — Other Ambulatory Visit: Payer: Self-pay

## 2017-11-14 DIAGNOSIS — M7989 Other specified soft tissue disorders: Secondary | ICD-10-CM

## 2018-05-04 ENCOUNTER — Ambulatory Visit: Payer: Medicare Other | Admitting: Surgery

## 2018-05-04 ENCOUNTER — Ambulatory Visit (HOSPITAL_COMMUNITY)
Admission: RE | Admit: 2018-05-04 | Discharge: 2018-05-04 | Disposition: A | Discharge status: Home / Self Care | Payer: Medicare Other | Source: Ambulatory Visit | Attending: Surgery | Admitting: Surgery

## 2018-05-04 DIAGNOSIS — M7989 Other specified soft tissue disorders: Secondary | ICD-10-CM | POA: Insufficient documentation

## 2018-05-18 ENCOUNTER — Ambulatory Visit: Payer: Medicare Other | Admitting: Surgery

## 2018-05-18 ENCOUNTER — Encounter (HOSPITAL_COMMUNITY): Payer: Medicare Other

## 2018-11-20 ENCOUNTER — Encounter (HOSPITAL_BASED_OUTPATIENT_CLINIC_OR_DEPARTMENT_OTHER): Payer: Medicare Other | Attending: Internal Medicine

## 2018-11-20 ENCOUNTER — Other Ambulatory Visit: Payer: Self-pay

## 2018-11-20 ENCOUNTER — Other Ambulatory Visit (HOSPITAL_BASED_OUTPATIENT_CLINIC_OR_DEPARTMENT_OTHER): Payer: Self-pay | Admitting: Internal Medicine

## 2018-11-20 DIAGNOSIS — D0472 Carcinoma in situ of skin of left lower limb, including hip: Secondary | ICD-10-CM | POA: Insufficient documentation

## 2018-11-20 DIAGNOSIS — I87332 Chronic venous hypertension (idiopathic) with ulcer and inflammation of left lower extremity: Secondary | ICD-10-CM | POA: Insufficient documentation

## 2018-11-20 DIAGNOSIS — E119 Type 2 diabetes mellitus without complications: Secondary | ICD-10-CM | POA: Insufficient documentation

## 2018-11-20 DIAGNOSIS — I2510 Atherosclerotic heart disease of native coronary artery without angina pectoris: Secondary | ICD-10-CM | POA: Diagnosis not present

## 2018-11-20 DIAGNOSIS — L97322 Non-pressure chronic ulcer of left ankle with fat layer exposed: Secondary | ICD-10-CM | POA: Insufficient documentation

## 2018-11-27 DIAGNOSIS — I87332 Chronic venous hypertension (idiopathic) with ulcer and inflammation of left lower extremity: Secondary | ICD-10-CM | POA: Diagnosis not present

## 2018-12-03 ENCOUNTER — Encounter (HOSPITAL_BASED_OUTPATIENT_CLINIC_OR_DEPARTMENT_OTHER): Payer: Medicare Other | Attending: Internal Medicine

## 2018-12-03 DIAGNOSIS — X58XXXA Exposure to other specified factors, initial encounter: Secondary | ICD-10-CM | POA: Insufficient documentation

## 2018-12-03 DIAGNOSIS — L97322 Non-pressure chronic ulcer of left ankle with fat layer exposed: Secondary | ICD-10-CM | POA: Diagnosis not present

## 2018-12-03 DIAGNOSIS — L97222 Non-pressure chronic ulcer of left calf with fat layer exposed: Secondary | ICD-10-CM | POA: Insufficient documentation

## 2018-12-03 DIAGNOSIS — I82811 Embolism and thrombosis of superficial veins of right lower extremities: Secondary | ICD-10-CM | POA: Diagnosis not present

## 2018-12-03 DIAGNOSIS — Y93H9 Activity, other involving exterior property and land maintenance, building and construction: Secondary | ICD-10-CM | POA: Insufficient documentation

## 2018-12-03 DIAGNOSIS — I2510 Atherosclerotic heart disease of native coronary artery without angina pectoris: Secondary | ICD-10-CM | POA: Insufficient documentation

## 2018-12-03 DIAGNOSIS — E119 Type 2 diabetes mellitus without complications: Secondary | ICD-10-CM | POA: Insufficient documentation

## 2018-12-03 DIAGNOSIS — C44709 Unspecified malignant neoplasm of skin of left lower limb, including hip: Secondary | ICD-10-CM | POA: Insufficient documentation

## 2018-12-03 DIAGNOSIS — S81811A Laceration without foreign body, right lower leg, initial encounter: Secondary | ICD-10-CM | POA: Insufficient documentation

## 2018-12-03 DIAGNOSIS — I87332 Chronic venous hypertension (idiopathic) with ulcer and inflammation of left lower extremity: Secondary | ICD-10-CM | POA: Diagnosis present

## 2018-12-07 ENCOUNTER — Encounter (HOSPITAL_BASED_OUTPATIENT_CLINIC_OR_DEPARTMENT_OTHER): Payer: Medicare Other

## 2018-12-10 DIAGNOSIS — I87332 Chronic venous hypertension (idiopathic) with ulcer and inflammation of left lower extremity: Secondary | ICD-10-CM | POA: Diagnosis not present

## 2018-12-17 ENCOUNTER — Other Ambulatory Visit (HOSPITAL_COMMUNITY)
Admission: RE | Admit: 2018-12-17 | Discharge: 2018-12-17 | Disposition: A | Discharge status: Home / Self Care | Payer: Medicare Other | Source: Other Acute Inpatient Hospital | Attending: Internal Medicine | Admitting: Internal Medicine

## 2018-12-17 DIAGNOSIS — L97321 Non-pressure chronic ulcer of left ankle limited to breakdown of skin: Secondary | ICD-10-CM | POA: Insufficient documentation

## 2018-12-17 DIAGNOSIS — I87332 Chronic venous hypertension (idiopathic) with ulcer and inflammation of left lower extremity: Secondary | ICD-10-CM | POA: Diagnosis not present

## 2018-12-19 LAB — AEROBIC CULTURE W GRAM STAIN (SUPERFICIAL SPECIMEN)

## 2018-12-19 LAB — AEROBIC CULTURE? (SUPERFICIAL SPECIMEN)
Culture: NORMAL
Gram Stain: NONE SEEN

## 2018-12-23 ENCOUNTER — Encounter (HOSPITAL_COMMUNITY): Payer: Self-pay

## 2018-12-23 ENCOUNTER — Other Ambulatory Visit (HOSPITAL_COMMUNITY): Payer: Self-pay | Admitting: Internal Medicine

## 2018-12-23 ENCOUNTER — Ambulatory Visit (HOSPITAL_COMMUNITY): Payer: Medicare Other

## 2018-12-23 DIAGNOSIS — L97321 Non-pressure chronic ulcer of left ankle limited to breakdown of skin: Secondary | ICD-10-CM

## 2018-12-24 ENCOUNTER — Other Ambulatory Visit: Payer: Self-pay

## 2018-12-24 ENCOUNTER — Encounter (HOSPITAL_COMMUNITY): Payer: Self-pay | Admitting: *Deleted

## 2018-12-24 ENCOUNTER — Other Ambulatory Visit (HOSPITAL_COMMUNITY): Payer: Self-pay | Admitting: Internal Medicine

## 2018-12-24 ENCOUNTER — Ambulatory Visit (HOSPITAL_COMMUNITY)
Admission: RE | Admit: 2018-12-24 | Discharge: 2018-12-24 | Disposition: A | Discharge status: Home / Self Care | Payer: Medicare Other | Source: Ambulatory Visit | Attending: Internal Medicine | Admitting: Internal Medicine

## 2018-12-24 ENCOUNTER — Ambulatory Visit (HOSPITAL_COMMUNITY)
Admission: RE | Admit: 2018-12-24 | Discharge: 2018-12-24 | Disposition: A | Discharge status: Home / Self Care | Payer: Medicare Other | Source: Ambulatory Visit | Attending: Family | Admitting: Family

## 2018-12-24 DIAGNOSIS — L97321 Non-pressure chronic ulcer of left ankle limited to breakdown of skin: Secondary | ICD-10-CM | POA: Diagnosis present

## 2018-12-24 DIAGNOSIS — L97929 Non-pressure chronic ulcer of unspecified part of left lower leg with unspecified severity: Secondary | ICD-10-CM

## 2018-12-24 DIAGNOSIS — I87332 Chronic venous hypertension (idiopathic) with ulcer and inflammation of left lower extremity: Secondary | ICD-10-CM | POA: Diagnosis present

## 2018-12-24 NOTE — Patient Instructions (Signed)
Patient became very frustrated and angry at the conclusion of the exam because I could not give him results. Appeared as though he was not going to leave, continued asking repeatedly to give him the results. Called me by my first name several times trying to assert authority. I told him I needed him to leave because I had another patient that needed to come into the exam room. At that point I shut the door to the room while he was still talking.

## 2018-12-31 DIAGNOSIS — I87332 Chronic venous hypertension (idiopathic) with ulcer and inflammation of left lower extremity: Secondary | ICD-10-CM | POA: Diagnosis not present

## 2019-01-06 ENCOUNTER — Telehealth: Payer: Self-pay | Admitting: *Deleted

## 2019-01-06 NOTE — Telephone Encounter (Signed)
Returning [name]'s earlier telephone voice message regarding tests results from [facility] vascular lab.  [name] had venous reflux study done at [facility] on 12-24-2018 but was not seen by a [facility] MD.  [name] is a patient at [facility] and venous reflux study was ordered by Dr. Dellia Nims. Spoke with Vivien Rota at [facility] and faxed a hardcopy of Mr. Goza venous reflux study to [facility] so Dr. Dellia Nims can review prior to [name] appointment tomorrow (01-07-2019).

## 2019-01-07 ENCOUNTER — Encounter (HOSPITAL_BASED_OUTPATIENT_CLINIC_OR_DEPARTMENT_OTHER): Payer: Medicare Other | Attending: Internal Medicine

## 2019-01-07 DIAGNOSIS — Z85828 Personal history of other malignant neoplasm of skin: Secondary | ICD-10-CM | POA: Insufficient documentation

## 2019-01-07 DIAGNOSIS — L97322 Non-pressure chronic ulcer of left ankle with fat layer exposed: Secondary | ICD-10-CM | POA: Diagnosis not present

## 2019-01-07 DIAGNOSIS — I87332 Chronic venous hypertension (idiopathic) with ulcer and inflammation of left lower extremity: Secondary | ICD-10-CM | POA: Diagnosis not present

## 2019-01-07 DIAGNOSIS — E119 Type 2 diabetes mellitus without complications: Secondary | ICD-10-CM | POA: Insufficient documentation

## 2019-01-07 DIAGNOSIS — I2510 Atherosclerotic heart disease of native coronary artery without angina pectoris: Secondary | ICD-10-CM | POA: Insufficient documentation

## 2019-01-07 DIAGNOSIS — L97222 Non-pressure chronic ulcer of left calf with fat layer exposed: Secondary | ICD-10-CM | POA: Insufficient documentation

## 2019-01-07 DIAGNOSIS — C44709 Unspecified malignant neoplasm of skin of left lower limb, including hip: Secondary | ICD-10-CM | POA: Diagnosis not present

## 2019-01-12 ENCOUNTER — Telehealth: Payer: Self-pay | Admitting: *Deleted

## 2019-01-12 NOTE — Telephone Encounter (Signed)
Returning patient call.  Juliann Pulse Comfort (vascular lab team lead) on the call with me over speaker phone.  Mr. Colclough questions and concerns need to be addressed by the physician in an on-site appointment.  Confirmed date and time of appointment with Mr. Schmieg August 20 at 2:20 PM with Dr. Scot Dock.  Requested that he come 15-20 minutes early to check in with front desk.

## 2019-01-14 DIAGNOSIS — I87332 Chronic venous hypertension (idiopathic) with ulcer and inflammation of left lower extremity: Secondary | ICD-10-CM | POA: Diagnosis not present

## 2019-01-20 ENCOUNTER — Telehealth (HOSPITAL_COMMUNITY): Payer: Self-pay | Admitting: Rehabilitation

## 2019-01-20 NOTE — Telephone Encounter (Signed)

## 2019-01-21 ENCOUNTER — Ambulatory Visit (INDEPENDENT_AMBULATORY_CARE_PROVIDER_SITE_OTHER): Payer: Medicare Other | Admitting: Vascular Surgery

## 2019-01-21 ENCOUNTER — Other Ambulatory Visit: Payer: Self-pay | Admitting: *Deleted

## 2019-01-21 ENCOUNTER — Encounter: Payer: Self-pay | Admitting: Vascular Surgery

## 2019-01-21 ENCOUNTER — Encounter: Payer: Self-pay | Admitting: *Deleted

## 2019-01-21 ENCOUNTER — Other Ambulatory Visit: Payer: Self-pay

## 2019-01-21 VITALS — BP 156/97 | HR 91 | Temp 97.1°F | Resp 16 | Ht 71.0 in | Wt 199.7 lb

## 2019-01-21 DIAGNOSIS — I70299 Other atherosclerosis of native arteries of extremities, unspecified extremity: Secondary | ICD-10-CM

## 2019-01-21 DIAGNOSIS — I739 Peripheral vascular disease, unspecified: Secondary | ICD-10-CM | POA: Diagnosis not present

## 2019-01-21 DIAGNOSIS — I872 Venous insufficiency (chronic) (peripheral): Secondary | ICD-10-CM | POA: Diagnosis not present

## 2019-01-21 DIAGNOSIS — L97909 Non-pressure chronic ulcer of unspecified part of unspecified lower leg with unspecified severity: Secondary | ICD-10-CM | POA: Diagnosis not present

## 2019-01-21 NOTE — Progress Notes (Signed)
REASON FOR CONSULT:    Chronic venous insufficiency.  The patient is referred by the wound care center.  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  He has evidence of tibial artery occlusive disease on exam and also has chronic venous insufficiency with deep venous reflux bilaterally.  He has CEAP C6 venous disease.  This is clearly a limb threatening situation.  I have recommended that we proceed with arteriography. I have reviewed with the patient the indications for arteriography. In addition, I have reviewed the potential complications of arteriography including but not limited to: Bleeding, arterial injury, arterial thrombosis, dye action, renal insufficiency, or other unpredictable medical problems. I have explained to the patient that if we find disease amenable to angioplasty we could potentially address this at the same time. I have discussed the potential complications of angioplasty and stenting, including but not limited to: Bleeding, arterial thrombosis, arterial injury, dissection, or the need for surgical intervention.  With respect to his venous disease I have explained that the treatment is typically elevation and compression.  Although he appears to have a reasonable arterial flow I do want to be sure that he has adequate circulation to continue with aggressive elevation and compression therapy to treat his wounds.  I explained clearly he is at some risk for not healing the biopsy on his posterior calf given his combined arterial and venous disease.  I have scheduled his arteriogram for Friday, 01/29/2019.  I will make further recommendations pending these results.  [name], MD, FACS Beeper [phone] Office: [phone]   HPI:   [name] is a pleasant  68 y.o. male, who was previously seen in our office by Dr. Annamarie Major in June 2019.  At that time he had leg swelling and evidence of arterial insufficiency.  His symptoms were minimal.  He presents now with a wound on his lateral malleolus which he has had for 3 months.  He states that this has improved although this has been very gradual.  In addition he had a skin cancer on the left posterior calf which was removed and he is scheduled for surgery next week to have it more fully excised.  I do not get any history of claudication although the patient has back issues which significantly limit his activity.  He does not describe any history of rest pain.  He has a previous history of a DVT in the remote past but cannot member the details.  He thinks this was in his left leg.  His risk factors for peripheral vascular disease include hypertension, hypercholesterolemia, and a family history of premature cardiovascular disease.  He denies any history of diabetes.  He is not a smoker.  He does have a history of coronary artery disease and is undergone previous coronary revascularization.  He thinks vein was taken from the right leg.  He is not had any recent cardiac symptoms.  Past Medical History:  Diagnosis Date  . Alcohol abuse   . CAD (coronary artery disease)    a. s/p CABG 2009 (Dr. Roxy Manns - LIMA-OM2, L radial - PDA, SVG-Dx) // b. Nuc 8/15: No ischemia, EF 65, normal study // c. Nuc 3/17: No significant reversible ischemia. LVEF 64% with normal wall motion. This is a low risk study.   . Carotid  stenosis    Carotid US 3/19: Bilateral ICA 1-39  . DDD (degenerative disc disease)   . Gout   . HLD (hyperlipidemia)   . Hx of adenomatous colonic polyps 07/25/2015  . Hyperglycemia   . Hypertension   . Lumbago    (low back pain)   . Neuropathy   . PAF (paroxysmal atrial fibrillation) ([place])    Anticoag (previously on Xarelto) stopped in 2017 due to concerns over bleeding risk in the setting of heavy  alcohol use)  . Paroxysmal atrial flutter ([place])    TEE 6/15: EF 50-55, no RWMA, mild MR, LAE, no LAA clot >> s/p DCCV to restore NSR  . S/P CABG x 1   . Sciatica     Family History  Problem Relation Age of Onset  . Coronary artery disease Unknown        family history  . Heart disease Father        CABG   . Heart attack Father   . Stroke Unknown        family history  . Colon cancer Neg Hx   . Colon polyps Neg Hx   . Esophageal cancer Neg Hx   . Rectal cancer Neg Hx   . Stomach cancer Neg Hx     SOCIAL HISTORY: Social History   Socioeconomic History  . Marital status: Married    Spouse name: Not on file  . Number of children: Not on file  . Years of education: Not on file  . Highest education level: Not on file  Occupational History  . Not on file  Social Needs  . Financial resource strain: Not on file  . Food insecurity    Worry: Not on file    Inability: Not on file  . Transportation needs    Medical: Not on file    Non-medical: Not on file  Tobacco Use  . Smoking status: Never Smoker  . Smokeless tobacco: Never Used  Substance and Sexual Activity  . Alcohol use: Yes    Alcohol/week: 0.0 standard drinks    Comment: 6 beers per week  . Drug use: No  . Sexual activity: Not on file  Lifestyle  . Physical activity    Days per week: Not on file    Minutes per session: Not on file  . Stress: Not on file  Relationships  . Social Herbalist on phone: Not on file    Gets together: Not on file    Attends religious service: Not on file    Active member of club or organization: Not on file    Attends meetings of clubs or organizations: Not on file    Relationship status: Not on file  . Intimate partner violence    Fear of current or ex partner: Not on file    Emotionally abused: Not on file    Physically abused: Not on file    Forced sexual activity: Not on file  Other Topics Concern  . Not on file  Social History Narrative  . Not on file     Allergies  Allergen Reactions  . Compazine Other (See Comments)    A lockjaw-type reaction--face locked up     Current Outpatient Medications  Medication Sig Dispense Refill  . diltiazem (CARTIA XT) 120 MG 24 hr capsule Take 1 capsule (120 mg total) by mouth daily. 90 capsule 3  . lisinopril (PRINIVIL,ZESTRIL) 20 MG tablet Take 1 tablet (20 mg total) by mouth daily.  90 tablet 3  . methocarbamol (ROBAXIN) 500 MG tablet Take 1 tablet by mouth 3 (three) times daily as needed for muscle spasms.   0  . metoprolol succinate (TOPROL-XL) 100 MG 24 hr tablet Take 1 tablet (100 mg total) by mouth daily. Take with or immediately following a meal. 90 tablet 3  . nitroGLYCERIN (NITROSTAT) 0.4 MG SL tablet place 1 tablet under the tongue every 5 minutes if needed for chest pain 25 tablet 3  . pravastatin (PRAVACHOL) 20 MG tablet Take 1 tablet (20 mg total) by mouth every evening. 90 tablet 3  . celecoxib (CELEBREX) 50 MG capsule Take 50 mg by mouth daily.     No current facility-administered medications for this visit.     REVIEW OF SYSTEMS:  [X]  denotes positive finding, [ ]  denotes negative finding Cardiac  Comments:  Chest pain or chest pressure:    Shortness of breath upon exertion:    Short of breath when lying flat:    Irregular heart rhythm:        Vascular    Pain in calf, thigh, or hip brought on by ambulation:    Pain in feet at night that wakes you up from your sleep:     Blood clot in your veins:    Leg swelling:         Pulmonary    Oxygen at home:    Productive cough:     Wheezing:         Neurologic    Sudden weakness in arms or legs:     Sudden numbness in arms or legs:     Sudden onset of difficulty speaking or slurred speech:    Temporary loss of vision in one eye:     Problems with dizziness:         Gastrointestinal    Blood in stool:     Vomited blood:         Genitourinary    Burning when urinating:     Blood in urine:        Psychiatric    Major  depression:         Hematologic    Bleeding problems:    Problems with blood clotting too easily:        Skin    Rashes or ulcers: x       Constitutional    Fever or chills:     PHYSICAL EXAM:   Vitals:   01/21/19 1452  BP: (!) 156/97  Pulse: 91  Resp: 16  Temp: (!) 97.1 F (36.2 C)  TempSrc: Temporal  SpO2: 96%  Weight: 199 lb 11.2 oz (90.6 kg)  Height: 5\' 11"  (1.803 m)    GENERAL: The patient is a well-nourished male, in no acute distress. The vital signs are documented above. CARDIAC: There is a regular rate and rhythm.  VASCULAR: I do not detect carotid bruits. On the left side, which is the side of concern, there is a palpable femoral and popliteal pulse.  I cannot palpate pedal pulses.  He does have a barely biphasic posterior tibial signal with the Doppler and a monophasic but fairly brisk dorsalis pedis signal with the Doppler. On the right side he has a palpable femoral and popliteal pulse.  I cannot palpate pedal pulses.  He has a brisk but monophasic dorsalis pedis and posterior tibial signal on the right. VENOUS EXAM: He does have significant hyperpigmentation bilaterally consistent with chronic venous insufficiency. He has an open  ulcer on his left lateral malleolus as documented below.  This is 1 cm in diameter and approximately 4 to 5 mm in depth. He also has a wound on his left calf.      PULMONARY: There is good air exchange bilaterally without wheezing or rales. ABDOMEN: Soft and non-tender with normal pitched bowel sounds.  MUSCULOSKELETAL: There are no major deformities or cyanosis. NEUROLOGIC: No focal weakness or paresthesias are detected. SKIN: There are no ulcers or rashes noted. PSYCHIATRIC: The patient has a normal affect.  DATA:    LABS: I reviewed his labs from 08/31/2015.  At that time his creatinine was 0.85.  I do not have any more recent labs.  ARTERIAL DOPPLER STUDY: I did find an arterial Doppler study from 11/03/2017.  On the left  side, he had a biphasic posterior tibial signal with a monophasic anterior tibial signal.  ABI was 91%.  Toe pressures were not obtained.  On the right side he had a monophasic anterior tibial and posterior tibial signal with an ABI of 60%.  Toe pressures were not obtained.  VENOUS DUPLEX: I have independently interpreted his venous duplex scan today.  On the left side there is no evidence of DVT or superficial venous thrombosis.  There is deep venous reflux involving the common femoral vein.  There is some reflux in the superficial system involving the saphenofemoral junction and proximal right great saphenous vein although the vein is not significantly dilated.  On the right side there is no evidence of DVT or superficial venous thrombosis.  There is deep venous reflux involving the common femoral vein.  There is some reflux at the saphenofemoral junction and proximal great saphenous vein on the right although the vein is not significantly dilated.

## 2019-01-21 NOTE — H&P (View-Only) (Signed)
REASON FOR CONSULT:    Chronic venous insufficiency.  The patient is referred by the wound care center.  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  He has evidence of tibial artery occlusive disease on exam and also has chronic venous insufficiency with deep venous reflux bilaterally.  He has CEAP C6 venous disease.  This is clearly a limb threatening situation.  I have recommended that we proceed with arteriography. I have reviewed with the patient the indications for arteriography. In addition, I have reviewed the potential complications of arteriography including but not limited to: Bleeding, arterial injury, arterial thrombosis, dye action, renal insufficiency, or other unpredictable medical problems. I have explained to the patient that if we find disease amenable to angioplasty we could potentially address this at the same time. I have discussed the potential complications of angioplasty and stenting, including but not limited to: Bleeding, arterial thrombosis, arterial injury, dissection, or the need for surgical intervention.  With respect to his venous disease I have explained that the treatment is typically elevation and compression.  Although he appears to have a reasonable arterial flow I do want to be sure that he has adequate circulation to continue with aggressive elevation and compression therapy to treat his wounds.  I explained clearly he is at some risk for not healing the biopsy on his posterior calf given his combined arterial and venous disease.  I have scheduled his arteriogram for Friday, 01/29/2019.  I will make further recommendations pending these results.  [name], MD, FACS Beeper [phone] Office: [phone]   HPI:   [name] is a pleasant  68 y.o. male, who was previously seen in our office by Dr. Annamarie Major in June 2019.  At that time he had leg swelling and evidence of arterial insufficiency.  His symptoms were minimal.  He presents now with a wound on his lateral malleolus which he has had for 3 months.  He states that this has improved although this has been very gradual.  In addition he had a skin cancer on the left posterior calf which was removed and he is scheduled for surgery next week to have it more fully excised.  I do not get any history of claudication although the patient has back issues which significantly limit his activity.  He does not describe any history of rest pain.  He has a previous history of a DVT in the remote past but cannot member the details.  He thinks this was in his left leg.  His risk factors for peripheral vascular disease include hypertension, hypercholesterolemia, and a family history of premature cardiovascular disease.  He denies any history of diabetes.  He is not a smoker.  He does have a history of coronary artery disease and is undergone previous coronary revascularization.  He thinks vein was taken from the right leg.  He is not had any recent cardiac symptoms.  Past Medical History:  Diagnosis Date  . Alcohol abuse   . CAD (coronary artery disease)    a. s/p CABG 2009 (Dr. Roxy Manns - LIMA-OM2, L radial - PDA, SVG-Dx) // b. Nuc 8/15: No ischemia, EF 65, normal study // c. Nuc 3/17: No significant reversible ischemia. LVEF 64% with normal wall motion. This is a low risk study.   . Carotid  stenosis    Carotid US 3/19: Bilateral ICA 1-39  . DDD (degenerative disc disease)   . Gout   . HLD (hyperlipidemia)   . Hx of adenomatous colonic polyps 07/25/2015  . Hyperglycemia   . Hypertension   . Lumbago    (low back pain)   . Neuropathy   . PAF (paroxysmal atrial fibrillation) ([place])    Anticoag (previously on Xarelto) stopped in 2017 due to concerns over bleeding risk in the setting of heavy  alcohol use)  . Paroxysmal atrial flutter ([place])    TEE 6/15: EF 50-55, no RWMA, mild MR, LAE, no LAA clot >> s/p DCCV to restore NSR  . S/P CABG x 1   . Sciatica     Family History  Problem Relation Age of Onset  . Coronary artery disease Unknown        family history  . Heart disease Father        CABG   . Heart attack Father   . Stroke Unknown        family history  . Colon cancer Neg Hx   . Colon polyps Neg Hx   . Esophageal cancer Neg Hx   . Rectal cancer Neg Hx   . Stomach cancer Neg Hx     SOCIAL HISTORY: Social History   Socioeconomic History  . Marital status: Married    Spouse name: Not on file  . Number of children: Not on file  . Years of education: Not on file  . Highest education level: Not on file  Occupational History  . Not on file  Social Needs  . Financial resource strain: Not on file  . Food insecurity    Worry: Not on file    Inability: Not on file  . Transportation needs    Medical: Not on file    Non-medical: Not on file  Tobacco Use  . Smoking status: Never Smoker  . Smokeless tobacco: Never Used  Substance and Sexual Activity  . Alcohol use: Yes    Alcohol/week: 0.0 standard drinks    Comment: 6 beers per week  . Drug use: No  . Sexual activity: Not on file  Lifestyle  . Physical activity    Days per week: Not on file    Minutes per session: Not on file  . Stress: Not on file  Relationships  . Social Herbalist on phone: Not on file    Gets together: Not on file    Attends religious service: Not on file    Active member of club or organization: Not on file    Attends meetings of clubs or organizations: Not on file    Relationship status: Not on file  . Intimate partner violence    Fear of current or ex partner: Not on file    Emotionally abused: Not on file    Physically abused: Not on file    Forced sexual activity: Not on file  Other Topics Concern  . Not on file  Social History Narrative  . Not on file     Allergies  Allergen Reactions  . Compazine Other (See Comments)    A lockjaw-type reaction--face locked up     Current Outpatient Medications  Medication Sig Dispense Refill  . diltiazem (CARTIA XT) 120 MG 24 hr capsule Take 1 capsule (120 mg total) by mouth daily. 90 capsule 3  . lisinopril (PRINIVIL,ZESTRIL) 20 MG tablet Take 1 tablet (20 mg total) by mouth daily.  90 tablet 3  . methocarbamol (ROBAXIN) 500 MG tablet Take 1 tablet by mouth 3 (three) times daily as needed for muscle spasms.   0  . metoprolol succinate (TOPROL-XL) 100 MG 24 hr tablet Take 1 tablet (100 mg total) by mouth daily. Take with or immediately following a meal. 90 tablet 3  . nitroGLYCERIN (NITROSTAT) 0.4 MG SL tablet place 1 tablet under the tongue every 5 minutes if needed for chest pain 25 tablet 3  . pravastatin (PRAVACHOL) 20 MG tablet Take 1 tablet (20 mg total) by mouth every evening. 90 tablet 3  . celecoxib (CELEBREX) 50 MG capsule Take 50 mg by mouth daily.     No current facility-administered medications for this visit.     REVIEW OF SYSTEMS:  [X]  denotes positive finding, [ ]  denotes negative finding Cardiac  Comments:  Chest pain or chest pressure:    Shortness of breath upon exertion:    Short of breath when lying flat:    Irregular heart rhythm:        Vascular    Pain in calf, thigh, or hip brought on by ambulation:    Pain in feet at night that wakes you up from your sleep:     Blood clot in your veins:    Leg swelling:         Pulmonary    Oxygen at home:    Productive cough:     Wheezing:         Neurologic    Sudden weakness in arms or legs:     Sudden numbness in arms or legs:     Sudden onset of difficulty speaking or slurred speech:    Temporary loss of vision in one eye:     Problems with dizziness:         Gastrointestinal    Blood in stool:     Vomited blood:         Genitourinary    Burning when urinating:     Blood in urine:        Psychiatric    Major  depression:         Hematologic    Bleeding problems:    Problems with blood clotting too easily:        Skin    Rashes or ulcers: x       Constitutional    Fever or chills:     PHYSICAL EXAM:   Vitals:   01/21/19 1452  BP: (!) 156/97  Pulse: 91  Resp: 16  Temp: (!) 97.1 F (36.2 C)  TempSrc: Temporal  SpO2: 96%  Weight: 199 lb 11.2 oz (90.6 kg)  Height: 5\' 11"  (1.803 m)    GENERAL: The patient is a well-nourished male, in no acute distress. The vital signs are documented above. CARDIAC: There is a regular rate and rhythm.  VASCULAR: I do not detect carotid bruits. On the left side, which is the side of concern, there is a palpable femoral and popliteal pulse.  I cannot palpate pedal pulses.  He does have a barely biphasic posterior tibial signal with the Doppler and a monophasic but fairly brisk dorsalis pedis signal with the Doppler. On the right side he has a palpable femoral and popliteal pulse.  I cannot palpate pedal pulses.  He has a brisk but monophasic dorsalis pedis and posterior tibial signal on the right. VENOUS EXAM: He does have significant hyperpigmentation bilaterally consistent with chronic venous insufficiency. He has an open  ulcer on his left lateral malleolus as documented below.  This is 1 cm in diameter and approximately 4 to 5 mm in depth. He also has a wound on his left calf.      PULMONARY: There is good air exchange bilaterally without wheezing or rales. ABDOMEN: Soft and non-tender with normal pitched bowel sounds.  MUSCULOSKELETAL: There are no major deformities or cyanosis. NEUROLOGIC: No focal weakness or paresthesias are detected. SKIN: There are no ulcers or rashes noted. PSYCHIATRIC: The patient has a normal affect.  DATA:    LABS: I reviewed his labs from 08/31/2015.  At that time his creatinine was 0.85.  I do not have any more recent labs.  ARTERIAL DOPPLER STUDY: I did find an arterial Doppler study from 11/03/2017.  On the left  side, he had a biphasic posterior tibial signal with a monophasic anterior tibial signal.  ABI was 91%.  Toe pressures were not obtained.  On the right side he had a monophasic anterior tibial and posterior tibial signal with an ABI of 60%.  Toe pressures were not obtained.  VENOUS DUPLEX: I have independently interpreted his venous duplex scan today.  On the left side there is no evidence of DVT or superficial venous thrombosis.  There is deep venous reflux involving the common femoral vein.  There is some reflux in the superficial system involving the saphenofemoral junction and proximal right great saphenous vein although the vein is not significantly dilated.  On the right side there is no evidence of DVT or superficial venous thrombosis.  There is deep venous reflux involving the common femoral vein.  There is some reflux at the saphenofemoral junction and proximal great saphenous vein on the right although the vein is not significantly dilated.

## 2019-01-22 DIAGNOSIS — I87332 Chronic venous hypertension (idiopathic) with ulcer and inflammation of left lower extremity: Secondary | ICD-10-CM | POA: Diagnosis not present

## 2019-01-25 DIAGNOSIS — I87332 Chronic venous hypertension (idiopathic) with ulcer and inflammation of left lower extremity: Secondary | ICD-10-CM | POA: Diagnosis not present

## 2019-01-26 ENCOUNTER — Other Ambulatory Visit (HOSPITAL_COMMUNITY)
Admission: RE | Admit: 2019-01-26 | Discharge: 2019-01-26 | Disposition: A | Discharge status: Home / Self Care | Payer: Medicare Other | Source: Ambulatory Visit | Attending: Vascular Surgery | Admitting: Vascular Surgery

## 2019-01-26 DIAGNOSIS — Z01812 Encounter for preprocedural laboratory examination: Secondary | ICD-10-CM | POA: Diagnosis present

## 2019-01-26 DIAGNOSIS — Z20828 Contact with and (suspected) exposure to other viral communicable diseases: Secondary | ICD-10-CM | POA: Diagnosis not present

## 2019-01-26 DIAGNOSIS — I7389 Other specified peripheral vascular diseases: Secondary | ICD-10-CM | POA: Insufficient documentation

## 2019-01-26 LAB — SARS CORONAVIRUS 2 (TAT 6-24 HRS): SARS Coronavirus 2: NEGATIVE

## 2019-01-28 DIAGNOSIS — I87332 Chronic venous hypertension (idiopathic) with ulcer and inflammation of left lower extremity: Secondary | ICD-10-CM | POA: Diagnosis not present

## 2019-01-29 ENCOUNTER — Other Ambulatory Visit: Payer: Self-pay

## 2019-01-29 ENCOUNTER — Encounter (HOSPITAL_COMMUNITY): Payer: Self-pay | Admitting: Vascular Surgery

## 2019-01-29 ENCOUNTER — Ambulatory Visit (HOSPITAL_COMMUNITY)
Admission: RE | Admit: 2019-01-29 | Discharge: 2019-01-29 | Disposition: A | Discharge status: Home / Self Care | Payer: Medicare Other | Attending: Vascular Surgery | Admitting: Vascular Surgery

## 2019-01-29 ENCOUNTER — Ambulatory Visit (HOSPITAL_COMMUNITY)
Admission: RE | Disposition: A | Discharge status: Home / Self Care | Payer: Self-pay | Source: Home / Self Care | Attending: Vascular Surgery

## 2019-01-29 DIAGNOSIS — I6523 Occlusion and stenosis of bilateral carotid arteries: Secondary | ICD-10-CM | POA: Diagnosis not present

## 2019-01-29 DIAGNOSIS — E785 Hyperlipidemia, unspecified: Secondary | ICD-10-CM | POA: Diagnosis not present

## 2019-01-29 DIAGNOSIS — I480 Paroxysmal atrial fibrillation: Secondary | ICD-10-CM | POA: Diagnosis not present

## 2019-01-29 DIAGNOSIS — M109 Gout, unspecified: Secondary | ICD-10-CM | POA: Diagnosis not present

## 2019-01-29 DIAGNOSIS — Z8249 Family history of ischemic heart disease and other diseases of the circulatory system: Secondary | ICD-10-CM | POA: Diagnosis not present

## 2019-01-29 DIAGNOSIS — Z823 Family history of stroke: Secondary | ICD-10-CM | POA: Insufficient documentation

## 2019-01-29 DIAGNOSIS — Z79899 Other long term (current) drug therapy: Secondary | ICD-10-CM | POA: Diagnosis not present

## 2019-01-29 DIAGNOSIS — G629 Polyneuropathy, unspecified: Secondary | ICD-10-CM | POA: Diagnosis not present

## 2019-01-29 DIAGNOSIS — I2510 Atherosclerotic heart disease of native coronary artery without angina pectoris: Secondary | ICD-10-CM | POA: Insufficient documentation

## 2019-01-29 DIAGNOSIS — I70243 Atherosclerosis of native arteries of left leg with ulceration of ankle: Secondary | ICD-10-CM

## 2019-01-29 DIAGNOSIS — Z951 Presence of aortocoronary bypass graft: Secondary | ICD-10-CM | POA: Diagnosis not present

## 2019-01-29 DIAGNOSIS — L97329 Non-pressure chronic ulcer of left ankle with unspecified severity: Secondary | ICD-10-CM | POA: Diagnosis not present

## 2019-01-29 DIAGNOSIS — I872 Venous insufficiency (chronic) (peripheral): Secondary | ICD-10-CM | POA: Insufficient documentation

## 2019-01-29 DIAGNOSIS — I10 Essential (primary) hypertension: Secondary | ICD-10-CM | POA: Diagnosis not present

## 2019-01-29 DIAGNOSIS — Z888 Allergy status to other drugs, medicaments and biological substances status: Secondary | ICD-10-CM | POA: Diagnosis not present

## 2019-01-29 HISTORY — PX: ABDOMINAL AORTOGRAM W/LOWER EXTREMITY: CATH118223

## 2019-01-29 LAB — POCT I-STAT, CHEM 8
BUN: 12 mg/dL (ref 8–23)
Calcium, Ion: 1.15 mmol/L (ref 1.15–1.40)
Chloride: 94 mmol/L — ABNORMAL LOW (ref 98–111)
Creatinine, Ser: 0.5 mg/dL — ABNORMAL LOW (ref 0.61–1.24)
Glucose, Bld: 247 mg/dL — ABNORMAL HIGH (ref 70–99)
HCT: 46 % (ref 39.0–52.0)
Hemoglobin: 15.6 g/dL (ref 13.0–17.0)
Potassium: 5.3 mmol/L — ABNORMAL HIGH (ref 3.5–5.1)
Sodium: 130 mmol/L — ABNORMAL LOW (ref 135–145)
TCO2: 32 mmol/L (ref 22–32)

## 2019-01-29 SURGERY — ABDOMINAL AORTOGRAM W/LOWER EXTREMITY
Anesthesia: LOCAL | Laterality: Bilateral

## 2019-01-29 MED ORDER — OXYCODONE HCL 5 MG PO TABS
5.0000 mg | ORAL_TABLET | ORAL | Status: DC | PRN
  Administered 2019-01-29 (×2): 5 mg via ORAL
  Filled 2019-01-29: qty 1

## 2019-01-29 MED ORDER — HEPARIN (PORCINE) IN NACL 1000-0.9 UT/500ML-% IV SOLN
INTRAVENOUS | Status: AC
  Filled 2019-01-29: qty 500

## 2019-01-29 MED ORDER — SODIUM CHLORIDE 0.9% FLUSH: 3.0000 mL | Freq: Two times a day (BID) | INTRAVENOUS | Status: DC

## 2019-01-29 MED ORDER — SODIUM CHLORIDE 0.9 % IV SOLN: 250.0000 mL | INTRAVENOUS | Status: DC | PRN

## 2019-01-29 MED ORDER — LIDOCAINE HCL (PF) 1 % IJ SOLN
INTRAMUSCULAR | Status: AC
  Filled 2019-01-29: qty 30

## 2019-01-29 MED ORDER — ALPRAZOLAM 0.25 MG PO TABS: 1.0000 mg | ORAL_TABLET | Freq: Two times a day (BID) | ORAL | Status: DC | PRN

## 2019-01-29 MED ORDER — HYDRALAZINE HCL 20 MG/ML IJ SOLN: 5.0000 mg | INTRAMUSCULAR | Status: DC | PRN

## 2019-01-29 MED ORDER — LABETALOL HCL 5 MG/ML IV SOLN: 10.0000 mg | INTRAVENOUS | Status: DC | PRN

## 2019-01-29 MED ORDER — SODIUM CHLORIDE 0.9 % IV SOLN
INTRAVENOUS | Status: DC
  Administered 2019-01-29: 07:00:00 via INTRAVENOUS

## 2019-01-29 MED ORDER — OXYCODONE HCL 5 MG PO TABS
ORAL_TABLET | ORAL | Status: AC
  Filled 2019-01-29: qty 1

## 2019-01-29 MED ORDER — FENTANYL CITRATE (PF) 100 MCG/2ML IJ SOLN
INTRAMUSCULAR | Status: DC | PRN
  Administered 2019-01-29 (×2): 50 ug via INTRAVENOUS

## 2019-01-29 MED ORDER — MIDAZOLAM HCL 2 MG/2ML IJ SOLN
INTRAMUSCULAR | Status: AC
  Filled 2019-01-29: qty 2

## 2019-01-29 MED ORDER — SODIUM CHLORIDE 0.9% FLUSH: 3.0000 mL | INTRAVENOUS | Status: DC | PRN

## 2019-01-29 MED ORDER — FENTANYL CITRATE (PF) 100 MCG/2ML IJ SOLN
INTRAMUSCULAR | Status: AC
  Filled 2019-01-29: qty 2

## 2019-01-29 MED ORDER — SODIUM CHLORIDE 0.9 % WEIGHT BASED INFUSION: 1.0000 mL/kg/h | INTRAVENOUS | Status: DC

## 2019-01-29 MED ORDER — MIDAZOLAM HCL 2 MG/2ML IJ SOLN
INTRAMUSCULAR | Status: DC | PRN
  Administered 2019-01-29: 1 mg via INTRAVENOUS
  Administered 2019-01-29: 2 mg via INTRAVENOUS
  Administered 2019-01-29: 1 mg via INTRAVENOUS

## 2019-01-29 MED ORDER — ACETAMINOPHEN 325 MG PO TABS: 650.0000 mg | ORAL_TABLET | ORAL | Status: DC | PRN

## 2019-01-29 MED ORDER — IODIXANOL 320 MG/ML IV SOLN
INTRAVENOUS | Status: DC | PRN
  Administered 2019-01-29: 170 mL via INTRA_ARTERIAL

## 2019-01-29 MED ORDER — LIDOCAINE HCL (PF) 1 % IJ SOLN
INTRAMUSCULAR | Status: DC | PRN
  Administered 2019-01-29: 30 mL via INTRADERMAL

## 2019-01-29 MED ORDER — ONDANSETRON HCL 4 MG/2ML IJ SOLN: 4.0000 mg | Freq: Four times a day (QID) | INTRAMUSCULAR | Status: DC | PRN

## 2019-01-29 SURGICAL SUPPLY — 13 items
CATH ANGIO 5F PIGTAIL 65CM (CATHETERS) ×1 IMPLANT
CATH CROSS OVER TEMPO 5F (CATHETERS) ×1 IMPLANT
CATH STRAIGHT 5FR 65CM (CATHETERS) ×1 IMPLANT
KIT MICROPUNCTURE NIT STIFF (SHEATH) ×1 IMPLANT
KIT PV (KITS) ×2 IMPLANT
SHEATH PINNACLE 5F 10CM (SHEATH) ×1 IMPLANT
SHEATH PROBE COVER 6X72 (BAG) ×1 IMPLANT
STOPCOCK MORSE 400PSI 3WAY (MISCELLANEOUS) ×1 IMPLANT
SYR MEDRAD MARK 7 150ML (SYRINGE) ×2 IMPLANT
TRANSDUCER W/STOPCOCK (MISCELLANEOUS) ×2 IMPLANT
TRAY PV CATH (CUSTOM PROCEDURE TRAY) ×2 IMPLANT
TUBING CIL FLEX 10 FLL-RA (TUBING) ×1 IMPLANT
WIRE BENTSON .035X145CM (WIRE) ×1 IMPLANT

## 2019-01-29 NOTE — Discharge Instructions (Signed)

## 2019-01-29 NOTE — Op Note (Signed)
PATIENT: [name]      MRN: [id number] DOB: Mar 05, 1951    DATE OF PROCEDURE: 01/29/2019  INDICATIONS:    [name] is a 68 y.o. male who presents with a slowly healing wound on his lateral malleolus of the left leg.  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  He presents for arteriography.  PROCEDURE:    1.  Conscious sedation 2.  Ultrasound-guided access to the right common femoral artery 3.  Aortogram with bilateral iliac arteriogram  4.  Selective catheterization of the left external iliac artery with left lower extremity runoff 5.  Retrograde right femoral arteriogram with right lower extremity runoff  SURGEON: Judeth Cornfield. Scot Dock, MD, FACS  ANESTHESIA: Local with sedation  EBL: Minimal  TECHNIQUE: The patient was brought to the peripheral vascular lab and was sedated. The period of conscious sedation was 45 minutes.  During that time period, I was present face-to-face 100% of the time.  The patient was administered initially 1 mg of Versed and 50 mcg of fentanyl.  He received additional medication throughout the case. The patient's heart rate, blood pressure, and oxygen saturation were monitored by the nurse continuously during the procedure.  Both groins were prepped and draped in the usual sterile fashion.  Under ultrasound guidance, after the skin was anesthetized, I cannulated the right common femoral artery with a micropuncture needle and a micropuncture sheath was introduced over a wire.  This was exchanged for a 5 [place] sheath over a Bentson wire.  By ultrasound the femoral artery was patent. A real-time image was obtained and sent to the server.  A pigtail catheter was then positioned at the L1 vertebral body.  Flush aortogram obtained.  An oblique iliac projection was obtained.  I then exchanged the pigtail catheter for a crossover catheter which was positioned into the left common iliac artery.  The Bentson wire  was advanced into the external iliac artery and then the crossover catheter exchanged for a straight catheter.  Selective left external iliac arteriogram was obtained with left lower extremity runoff.  This catheter was then removed and a retrograde right femoral arteriogram was obtained with right lower extremity runoff.  FINDINGS:   1.  There are single renal arteries bilaterally with no significant renal artery stenosis identified. 2.  The infrarenal aorta, bilateral common iliac arteries, bilateral hypogastric arteries, and bilateral external iliac arteries are patent. 3.  On the left side, which is the side of concern, the common femoral, deep femoral, superficial femoral, and popliteal arteries are widely patent.  There is single-vessel runoff on the left via the posterior tibial artery.  There is 1 focal area with a perhaps 40 to 50% stenosis.  The anterior tibial and peroneal arteries are occluded.  He has good runoff onto the foot and plantar arch. 4.  On the right side the common femoral, deep femoral, superficial femoral, and popliteal arteries are patent.  There is single-vessel runoff via the posterior tibial artery on the right with some disease proximally.  The anterior tibial and peroneal arteries are occluded.  CLINICAL NOTE: I think the patient has adequate circulation to heal his venous ulcers and can continue to use mild elevation and compression.  The wound has been gradually healing and he has a biphasic posterior tibial signal with the Doppler with an ABI of 91%.  I do not think the stenosis in the proximal posterior tibial artery at this point is significant and addressing this  with angioplasty would be associated with increased risk as this is his only runoff.  I do not think addressing it would have significant impact on his distal circulation given that he currently has a biphasic signal with an ABI of 91%.  I will see him back in the office in 1 month.  He knows to call sooner  if he has problems.  [name], MD, FACS [facility]  DATE OF DICTATION:   01/29/2019

## 2019-01-29 NOTE — Interval H&P Note (Signed)
History and Physical Interval Note:  01/29/2019 7:33 AM  [name]  has presented today for surgery, with the diagnosis of PVD ulcer.  The various methods of treatment have been discussed with the patient and family. After consideration of risks, benefits and other options for treatment, the patient has consented to  Procedure(s): ABDOMINAL AORTOGRAM W/LOWER EXTREMITY (Bilateral) as a surgical intervention.  The patient's history has been reviewed, patient examined, no change in status, stable for surgery.  I have reviewed the patient's chart and labs.  Questions were answered to the patient's satisfaction.     [name]

## 2019-01-29 NOTE — Progress Notes (Signed)
Discharge instruction given to patient and called to wife.  Pt and wife able to verbalize understanding.

## 2019-01-29 NOTE — Progress Notes (Signed)
     5 Fr sheath was remoed from the R FA by   Aurelio Jew RN , and manual pressure was held for 15 min. Sterile gauze was applied at the site. R groin is soft and non tender.   R AT was obtained with doppler before and after the sheath pull.   Bed rest started at 0915 X 4 hr.  HR 74 SR BP 169/79 sPO2 98% on RA

## 2019-02-01 MED FILL — Heparin Sod (Porcine)-NaCl IV Soln 1000 Unit/500ML-0.9%: INTRAVENOUS | Qty: 1000 | Status: AC

## 2019-02-04 ENCOUNTER — Encounter (HOSPITAL_BASED_OUTPATIENT_CLINIC_OR_DEPARTMENT_OTHER): Payer: Medicare Other | Attending: Internal Medicine

## 2019-02-04 DIAGNOSIS — L97322 Non-pressure chronic ulcer of left ankle with fat layer exposed: Secondary | ICD-10-CM | POA: Insufficient documentation

## 2019-02-04 DIAGNOSIS — I2510 Atherosclerotic heart disease of native coronary artery without angina pectoris: Secondary | ICD-10-CM | POA: Diagnosis not present

## 2019-02-04 DIAGNOSIS — C44799 Other specified malignant neoplasm of skin of left lower limb, including hip: Secondary | ICD-10-CM | POA: Diagnosis not present

## 2019-02-04 DIAGNOSIS — I87332 Chronic venous hypertension (idiopathic) with ulcer and inflammation of left lower extremity: Secondary | ICD-10-CM | POA: Insufficient documentation

## 2019-02-04 DIAGNOSIS — I739 Peripheral vascular disease, unspecified: Secondary | ICD-10-CM | POA: Insufficient documentation

## 2019-02-04 DIAGNOSIS — L97222 Non-pressure chronic ulcer of left calf with fat layer exposed: Secondary | ICD-10-CM | POA: Diagnosis not present

## 2019-02-04 DIAGNOSIS — E1151 Type 2 diabetes mellitus with diabetic peripheral angiopathy without gangrene: Secondary | ICD-10-CM | POA: Insufficient documentation

## 2019-02-05 ENCOUNTER — Other Ambulatory Visit: Payer: Self-pay

## 2019-02-10 ENCOUNTER — Other Ambulatory Visit: Payer: Self-pay | Admitting: Physician Assistant

## 2019-02-11 DIAGNOSIS — I87332 Chronic venous hypertension (idiopathic) with ulcer and inflammation of left lower extremity: Secondary | ICD-10-CM | POA: Diagnosis not present

## 2019-02-19 ENCOUNTER — Other Ambulatory Visit: Payer: Self-pay | Admitting: Physician Assistant

## 2019-02-19 DIAGNOSIS — I87332 Chronic venous hypertension (idiopathic) with ulcer and inflammation of left lower extremity: Secondary | ICD-10-CM | POA: Diagnosis not present

## 2019-02-24 ENCOUNTER — Ambulatory Visit: Payer: Medicare Other | Admitting: Vascular Surgery

## 2019-02-25 ENCOUNTER — Encounter: Payer: Self-pay | Admitting: Family

## 2019-02-25 DIAGNOSIS — I87332 Chronic venous hypertension (idiopathic) with ulcer and inflammation of left lower extremity: Secondary | ICD-10-CM | POA: Diagnosis not present

## 2019-03-05 ENCOUNTER — Encounter (HOSPITAL_BASED_OUTPATIENT_CLINIC_OR_DEPARTMENT_OTHER): Payer: Medicare Other | Attending: Internal Medicine | Admitting: Internal Medicine

## 2019-03-05 ENCOUNTER — Other Ambulatory Visit: Payer: Self-pay

## 2019-03-05 DIAGNOSIS — E119 Type 2 diabetes mellitus without complications: Secondary | ICD-10-CM | POA: Diagnosis not present

## 2019-03-05 DIAGNOSIS — L97322 Non-pressure chronic ulcer of left ankle with fat layer exposed: Secondary | ICD-10-CM | POA: Insufficient documentation

## 2019-03-05 DIAGNOSIS — I2510 Atherosclerotic heart disease of native coronary artery without angina pectoris: Secondary | ICD-10-CM | POA: Insufficient documentation

## 2019-03-05 DIAGNOSIS — C44709 Unspecified malignant neoplasm of skin of left lower limb, including hip: Secondary | ICD-10-CM | POA: Insufficient documentation

## 2019-03-05 DIAGNOSIS — I87332 Chronic venous hypertension (idiopathic) with ulcer and inflammation of left lower extremity: Secondary | ICD-10-CM | POA: Insufficient documentation

## 2019-03-05 NOTE — Progress Notes (Signed)
[name], [name] ([id number]) Visit Report for 03/05/2019 Debridement Details Patient Name: Date of Service: [name], [name] 03/05/2019 3:00 PM Medical Record [id number] Patient Account Number: [id number] Date of Birth/Sex: Treating RN: [birth year] (68 y.o. [name] Primary Care Provider: PATIENT, NO Other Clinician: Referring Provider: Treating Provider/Extender:Jessy Cybulski, [name], Designated Weeks in Treatment: 15 Debridement Performed for Wound #4 Left,Lateral Malleolus Assessment: Performed By: Physician Ricard Dillon., MD Debridement Type: Debridement Severity of Tissue Pre Fat layer exposed Debridement: Level of Consciousness (Pre- Awake and Alert procedure): Pre-procedure Verification/Time Out Taken: Yes - 17:00 Start Time: 17:00 Pain Control: Other : Benzocaine 20% Total Area Debrided (L x W): 1.1 (cm) x 1.1 (cm) = 1.21 (cm) Tissue and other material Viable, Non-Viable, Slough, Subcutaneous, Slough debrided: Level: Skin/Subcutaneous Tissue Debridement Description: Excisional Instrument: Curette Bleeding: Minimum Hemostasis Achieved: Pressure End Time: 17:02 Procedural Pain: 5 Post Procedural Pain: 4 Response to Treatment: Procedure was tolerated well Level of Consciousness Awake and Alert (Post-procedure): Post Debridement Measurements of Total Wound Length: (cm) 1.1 Width: (cm) 1.1 Depth: (cm) 0.7 Volume: (cm) 0.665 Character of Wound/Ulcer Post Improved Debridement: Severity of Tissue Post Debridement: Fat layer exposed Post Procedure Diagnosis Same as Pre-procedure Electronic Signature(s) Signed: 03/05/2019 6:19:43 PM By: Linton Ham MD Signed: 03/05/2019 7:05:19 PM By: Baruch Gouty RN, BSN Entered By: Linton Ham on 03/05/2019 17:49:04 -------------------------------------------------------------------------------- HPI Details Patient Name: Date of Service: Michael Heidelberg. 03/05/2019 3:00 PM Medical Record  [id number] Patient Account Number: [id number] Date of Birth/Sex: Treating RN: 1950-10-13 (68 y.o. [name] Primary Care Provider: PATIENT, NO Other Clinician: Referring Provider: Treating Provider/Extender:Alaya Iverson, [name], Designated Weeks in Treatment: 15 History of Present Illness HPI Description: 08/22/17-He is here for initial evaluation of a right heel wound, right toe wound and left heel fissures. He states that he has chronic dry, flaking skin and frequently has cracked heels. He states the right heel wound developed as such but he developed the wound approximately 3 weeks ago when pulling some skin off; he was unaware of the right toe ulcer. He was treating the right heel with over-the-counter triple antibiotic ointment and peroxide. He presented to urgent care on 3/12 with a 2 x 2 centimeter ulceration to the right heel and was discharged with a 10 day prescription for Bactrim. He does have a history of neuropathy, diet controlled diabetic. His does not know his most recent A1c but will be seeing his PCP next week, there is no evidence of an A1c in Epic. He denies smoking, does have a history of alcohol use, he admits to 5+/- beers weekly not daily. He did follow-up with cardiology on 3/18 with c/o ble edema, they ordered DVT study which he completed on 3/21. He reports that the bilateral DVT study was negative per the ultrasound technician, no report is available at this time. READMISSION 11/20/2018 this is a now 68 year old man who is here for review of wounds on his lower extremity on the left. We note that he was here on a single visit in March he tells [place] that over the last 2019. His major issue is that he dropped a box and hit the outside of his left lateral malleolus about 3 weeks ago. This is left him with a nonhealing area. He has been using Iodosorb ointment he had apparently from the last stay in this clinic. Also of note over the last 6 weeks he has  noted an area that is raised and will scab over but then he traumatizes a  scab and then this bleeds and reopens. This is on the left posterior calf. All of this appears to be on the background of chronic venous insufficiency. He has had DVT studies in the past that were negative in 2019. He is also had arterial studies that showed an ABI on the right of 0.76 and on the left of 1.05. Past medical history; the patient states that he is not a diabetic although I note there is some suggested he was in the note from last year. He has a history of coronary artery disease. He tells me that he has had a history of skin cancer but he is not sure which one but that includes one on the left upper thigh. ABIs done previously 0.76 on the right and 1.05 on the left 6/26; patient readmitted to the clinic last week. He had a traumatic area over the left lateral malleolus as well as a hyper granulated growth on the left posterior calf. He also has severe chronic venous insufficiency. 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 We have been using Iodoflex to the traumatic wound under compression. 7/9; area over the left lateral malleolus continues to require debridement and we are continuing with Iodoflex under compression. He has severe chronic venous insufficiency. 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 This patient has severe bilateral venous insufficiency and venous hypertension with skin changes resulting. He requires bilateral reflux studies Finally he traumatized his right lateral calf while doing yard work about a week ago. The area is small with a flap of skin over the majority of the wound but I am not sure this is going to remain viable 7/16; still not a viable surface on the left lateral malleolus in fact this wound is deeper.  We have been using Iodoflex. 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 He has severe bilateral venous insufficiency and I have ordered reflux studies these apparently are booked for next week. Our intake nurse noted some yellowish-green drainage coming from the wound on the left ankle. Patient was insistent on talking about doing carotid ultrasounds and he was hoping to get these done with his reflux studies next week. I spent a few minutes talking to him about this I just could not put together in a logical way what he is concerned about. At the end of this I asked him to see his primary doctor about this he says he does not have a primary doctor advised him to get a primary doctor to go over this with him. It was not really clear where the level of concern was specific to carotid ultrasound 7/23; a better looking surface on the left lateral malleolus although it still requires debridement. He tells me he has an appointment to [facility] surgery center next week. Small area on the right lateral calf. The patient went to [facility] I do not think they took his compression wraps off. He did have reflux on the left in the common femoral greater saphenous and saphenofemoral junction and the great saphenous vein in the proximal thigh. 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 Looking at the numbers it would appear that the maximal reflux was in the great saphenous vein in the proximal thigh. As usual I am not sure what would be possible to do here. I will send him to vascular surgery 7/30; the patient still requires debridement in the left lateral malleolus although in general the wound is still deep and and punched out. We are  using Sorbact. The area on the right lateral calf is improved. 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 The patient does not have a vascular surgery appointment nor does he have a skin surgery center appointment. I am really not sure what the issues are here although I know vascular surgery as well behind. 8/6-Patient does not have a vascular appointment nor does he have a skin surgery appointment firmed up at this time although he says he is called them both, impressed upon him that he should see whoever is available for the vascular so the vein studies can be interpreted. 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 He is here for the left lateral malleoli wound for which we are using SOrbact, and he is disappointed the wound is not healing 8/21; patient saw Dr. Doren Custard on 01/21/2019. Dr. Doren Custard was concerned about his arterial flow. Noting that he had trouble feeling the dorsalis pedis and posterior tibial arteries also noteworthy that the Doppler had a barely biphasic posterior tibial signal with a monophasic but fairly brisk dorsalis pedis signal. He is booked for an angiogram next Friday. We have been using 3 layer compression on him. 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 Given Dr. Mee Hives concern it was difficult for me to be comfortable with him going ahead with the cancer extraction and I have suggested canceling this. Finally he has an increase in the erythematous contact  dermatitis in the entire wrapped area of the left leg which is probably an allergy to the contact layer of the 3 layer. 8/27; he is going for his angiogram tomorrow by Dr. Doren Custard. We cancel the Mohs surgery on the tumor on his posterior calf pending the angiogram tomorrow. If this shows adequate blood flow to this area I think we can rebook this. His main wound is on the left lateral malleolus a small refractory punched-out area. We have been using Hydrofera Blue 9/3; his angiogram was done as scheduled. He does indeed have PAD. The common femoral, deep femoral superficial femoral and popliteal arteries will are all widely patent. 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 The overall thought was that he had enough adequate circulation to heal his venous ulcer and continue to use mild elevation and compression. Noted that he had biphasic posterior  tibial signals with a Doppler ABI of 0.91. It was not felt that the stenosis in the proximal tibial artery would be associated with increased blood flow he will follow-up with Dr. Doren Custard in 1 month He now needs to get the skin surgery appointment set up. The area is growing larger and bleeding frequently on the posterior left calf per the patient 02/11/19-Patient is back after 1 week, results of his vascular studies are noted, patient needs to have a skin surgery appointment established apparently he did not return the calls this week. The area on the posterior calf is where the skin surgery needs to be done, the left lateral malleoli are wound appears to be about the same we will using Hydrofera Blue change 9/24; patient finally has a booking to deal with the cancer on his posterior calf I believe with the skin surgery center. Not much change in the wound over the  left lateral malleolus. It is been 2 weeks since he was here. I changed him to endoform last time 10/2; the patient has been to see dermatology. Scheduled for surgery on 10/14. Using endoform to the wound over the lateral malleolus some improvement in the wound condition but not the surface area. He has severe chronic venous insufficiency. He also has some arterial disease he was angiogram by Dr. Doren Custard. He did not think that anything needed to be done from an intervention point of view. Electronic Signature(s) Signed: 03/05/2019 6:19:43 PM By: Linton Ham MD Entered By: Linton Ham on 03/05/2019 17:51:03 -------------------------------------------------------------------------------- Physical Exam Details Patient Name: Date of Service: [name], [name] 03/05/2019 3:00 PM Medical Record [id number] Patient Account Number: [id number] Date of Birth/Sex: Treating RN: February 07, 1951 (68 y.o. [name] Primary Care Provider: PATIENT, NO Other Clinician: Referring Provider: Treating Provider/Extender:Jazmaine Fuelling, [name], Designated Weeks in Treatment: 15 Constitutional Patient is hypertensive.. Pulse regular and within target range for patient.Marland Kitchen Respirations regular, non-labored and within target range.. Temperature is normal and within the target range for the patient.Marland Kitchen Appears in no distress. Notes Wound exam;; the area on the left posterior calf is separating. I did not to serve this today Left lateral malleolus requires debridement with a #3 curette I am able to get to a healthy surface but this is not filling in. Electronic Signature(s) Signed: 03/05/2019 6:19:43 PM By: Linton Ham MD Entered By: Linton Ham on 03/05/2019 17:53:02 -------------------------------------------------------------------------------- Physician Orders Details Patient Name: Date of Service: [name], [name] 03/05/2019 3:00 PM Medical Record [id number] Patient Account  Number: [id number] Date of Birth/Sex: Treating RN: 1950-08-16 (68 y.o. [name] Primary Care Provider: PATIENT, NO Other Clinician: Referring Provider: Treating Provider/Extender:Maygan Koeller, [name], Designated Weeks in Treatment: 15 Verbal / Phone Orders: No Diagnosis Coding ICD-10 Coding Code Description I87.332 Chronic venous hypertension (idiopathic) with ulcer and inflammation of left lower extremity L97.321 Non-pressure chronic ulcer of left ankle limited to breakdown of skin C44.799 Other specified malignant neoplasm of skin of left lower limb, including hip Follow-up Appointments Return Appointment in 1 week. Dressing Change Frequency Do not change entire dressing for one week. Skin Barriers/Peri-Wound Care TCA Cream or Ointment - liberal to lower leg Wound Cleansing May shower with protection. Primary Wound Dressing Wound #4 Left,Lateral Malleolus Endoform - moisten with saline, use small piece of gauze packing behind endoform Wound #5 Left,Posterior Calf Foam Secondary Dressing Wound #4 Left,Lateral Malleolus Dry Gauze Edema Control 3 Layer Compression System - Left Lower Extremity - no cotton use kerlix. Avoid standing for long periods of time Elevate legs  to the level of the heart or above for 30 minutes daily and/or when sitting, a frequency of: - throughout the day Exercise regularly Additional Orders / Instructions Wound #5 Left,Posterior Calf Other: - Patient to keep scheduled appointment to have skin cancer removed from left posterior lower leg. Electronic Signature(s) Signed: 03/05/2019 6:19:43 PM By: Linton Ham MD Signed: 03/05/2019 7:05:19 PM By: Baruch Gouty RN, BSN Entered By: Baruch Gouty on 03/05/2019 17:04:32 -------------------------------------------------------------------------------- Problem List Details Patient Name: Date of Service: [name], [name] 03/05/2019 3:00 PM Medical Record [id number] Patient Account  Number: [id number] Date of Birth/Sex: Treating RN: 12-07-1950 (68 y.o. [name] Primary Care Provider: PATIENT, NO Other Clinician: Referring Provider: Treating Provider/Extender:Veora Fonte, [name], Designated Weeks in Treatment: 15 Active Problems ICD-10 Evaluated Encounter Code Description Active Date Today Diagnosis I87.332 Chronic venous hypertension (idiopathic) with ulcer 11/20/2018 No Yes and inflammation of left lower extremity L97.321 Non-pressure chronic ulcer of left ankle limited to 11/20/2018 No Yes breakdown of skin C44.799 Other specified malignant neoplasm of skin of left 11/27/2018 No Yes lower limb, including hip Inactive Problems ICD-10 Code Description Active Date Inactive Date L97.211 Non-pressure chronic ulcer of right calf limited to breakdown of 12/10/2018 12/10/2018 skin Resolved Problems Electronic Signature(s) Signed: 03/05/2019 6:19:43 PM By: Linton Ham MD Entered By: Linton Ham on 03/05/2019 17:48:13 -------------------------------------------------------------------------------- Progress Note Details Patient Name: Date of Service: Michael Heidelberg. 03/05/2019 3:00 PM Medical Record [id number] Patient Account Number: [id number] Date of Birth/Sex: Treating RN: 04-26-1951 (68 y.o. [name] Primary Care Provider: PATIENT, NO Other Clinician: Referring Provider: Treating Provider/Extender:Patra Gherardi, [name], Designated Weeks in Treatment: 15 Subjective History of Present Illness (HPI) 08/22/17-He is here for initial evaluation of a right heel wound, right toe wound and left heel fissures. He states that he has chronic dry, flaking skin and frequently has cracked heels. He states the right heel wound developed as such but he developed the wound approximately 3 weeks ago when pulling some skin off; he was unaware of the right toe ulcer. He was treating the right heel with over-the-counter triple antibiotic ointment and  peroxide. He presented to urgent care on 3/12 with a 2 x 2 centimeter ulceration to the right heel and was discharged with a 10 day prescription for Bactrim. He does have a history of neuropathy, diet controlled diabetic. His does not know his most recent A1c but will be seeing his PCP next week, there is no evidence of an A1c in Epic. He denies smoking, does have a history of alcohol use, he admits to 5+/- beers weekly not daily. He did follow-up with cardiology on 3/18 with c/o ble edema, they ordered DVT study which he completed on 3/21. He reports that the bilateral DVT study was negative per the ultrasound technician, no report is available at this time. READMISSION 11/20/2018 this is a now 68 year old man who is here for review of wounds on his lower extremity on the left. We note that he was here on a single visit in March he tells [place] that over the last 2019. His major issue is that he dropped a box and hit the outside of his left lateral malleolus about 3 weeks ago. This is left him with a nonhealing area. He has been using Iodosorb ointment he had apparently from the last stay in this clinic. Also of note over the last 6 weeks he has noted an area that is raised and will scab over but then he traumatizes a scab and then this bleeds  and reopens. This is on the left posterior calf. All of this appears to be on the background of chronic venous insufficiency. He has had DVT studies in the past that were negative in 2019. He is also had arterial studies that showed an ABI on the right of 0.76 and on the left of 1.05. Past medical history; the patient states that he is not a diabetic although I note there is some suggested he was in the note from last year. He has a history of coronary artery disease. He tells me that he has had a history of skin cancer but he is not sure which one but that includes one on the left upper thigh. ABIs done previously 0.76 on the right and 1.05 on the left 6/26;  patient readmitted to the clinic last week. He had a traumatic area over the left lateral malleolus as well as a hyper granulated growth on the left posterior calf. He also has severe chronic venous insufficiency. 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 We have been using Iodoflex to the traumatic wound under compression. 7/9; area over the left lateral malleolus continues to require debridement and we are continuing with Iodoflex under compression. He has severe chronic venous insufficiency. 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 This patient has severe bilateral venous insufficiency and venous hypertension with skin changes resulting. He requires bilateral reflux studies Finally he traumatized his right lateral calf while doing yard work about a week ago. The area is small with a flap of skin over the majority of the wound but I am not sure this is going to remain viable 7/16; still not a viable surface on the left lateral malleolus in fact this wound is deeper. We have been using Iodoflex. 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 He has severe bilateral venous insufficiency and I have ordered reflux studies these apparently are booked for next week. Our intake nurse noted some yellowish-green drainage coming from the wound on the left ankle. Patient was insistent on talking about doing carotid ultrasounds and he was hoping to get these done with his reflux studies next week. I spent a few minutes talking to him about this I just could not put together in a logical way what he is concerned about. At the end of this I asked him to see his primary doctor about this he says he does not have a primary  doctor advised him to get a primary doctor to go over this with him. It was not really clear where the level of concern was specific to carotid ultrasound 7/23; a better looking surface on the left lateral malleolus although it still requires debridement. He tells me he has an appointment to [facility] surgery center next week. Small area on the right lateral calf. The patient [facility] I do not think they took his compression wraps off. He did have reflux on the left in the common femoral greater saphenous and saphenofemoral junction and the great saphenous vein in the proximal thigh. 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 Looking at the numbers it would appear that the maximal reflux was in the great saphenous vein in the proximal thigh. As usual I am not sure what would be possible to do here. I will send him to vascular surgery 7/30; the patient still requires debridement in the left lateral malleolus although in general the wound is still deep and and punched out. We are using Sorbact. The area on the right lateral calf is improved. 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 The patient does not have a vascular surgery appointment nor does he have a skin surgery center appointment. I am really not sure what the issues are here although I know vascular surgery as well behind. 8/6-Patient does not have a vascular appointment nor does he have a skin surgery appointment firmed up at this time although he says he is called them both, impressed upon him that he should see whoever is available for the vascular so the vein studies can be interpreted. 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 He is here for the left lateral malleoli wound for which we are using SOrbact, and he is disappointed the wound is not healing 8/21; patient saw Dr. Doren Custard on 01/21/2019. Dr. Doren Custard was concerned about his arterial flow. Noting that he had trouble feeling the dorsalis pedis and posterior tibial arteries also noteworthy that the Doppler had a barely biphasic posterior tibial signal with a monophasic but fairly brisk dorsalis pedis signal. He is booked for an angiogram next Friday. We have been using 3 layer compression on him. 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 Given Dr. Mee Hives concern it was difficult for me to be comfortable with him going ahead with the cancer extraction and I have suggested canceling this. Finally he has an increase in the erythematous contact dermatitis in the entire wrapped area of the left leg which is probably an allergy to the contact layer of the 3 layer. 8/27; he is going for his angiogram tomorrow by Dr. Doren Custard. We cancel the Mohs surgery on the tumor on his posterior calf pending the angiogram tomorrow. If this shows adequate blood flow to this area I think we can rebook this. His main wound is on the left lateral malleolus a small refractory punched-out area. We have been using Hydrofera Blue 9/3; his angiogram was done as scheduled. He does indeed have PAD. The common femoral, deep femoral superficial femoral and popliteal arteries will are all widely patent. There was single-vessel runoff on the left via the posterior tibial artery that had 1 focal area with perhaps 40 to 50% stenosis the  anterior tibial and peroneal arteries are occluded he had good runoff into the foot and plantar arch. The overall thought was that he had enough adequate circulation to heal his venous ulcer and continue to use mild elevation and compression. Noted that he had biphasic posterior tibial signals with a  Doppler ABI of 0.91. It was not felt that the stenosis in the proximal tibial artery would be associated with increased blood flow he will follow-up with Dr. Doren Custard in 1 month He now needs to get the skin surgery appointment set up. The area is growing larger and bleeding frequently on the posterior left calf per the patient 02/11/19-Patient is back after 1 week, results of his vascular studies are noted, patient needs to have a skin surgery appointment established apparently he did not return the calls this week. The area on the posterior calf is where the skin surgery needs to be done, the left lateral malleoli are wound appears to be about the same we will using Hydrofera Blue change 9/24; patient finally has a booking to deal with the cancer on his posterior calf I believe with the skin surgery center. Not much change in the wound over the left lateral malleolus. It is been 2 weeks since he was here. I changed him to endoform last time 10/2; the patient has been to see dermatology. Scheduled for surgery on 10/14. Using endoform to the wound over the lateral malleolus some improvement in the wound condition but not the surface area. He has severe chronic venous insufficiency. He also has some arterial disease he was angiogram by Dr. Doren Custard. He did not think that anything needed to be done from an intervention point of view. Objective Constitutional Patient is hypertensive.. Pulse regular and within target range for patient.Marland Kitchen Respirations regular, non-labored and within target range.. Temperature is normal and within the target range for the patient.Marland Kitchen Appears in no distress. Vitals Time Taken: 3:56  PM, Height: 73 in, Weight: 205 lbs, BMI: 27, Temperature: 98.6 F, Pulse: 75 bpm, Respiratory Rate: 18 breaths/min, Blood Pressure: 177/92 mmHg. General Notes: no signs and symptoms of headache, dizziness ringing in the ear. Patient stated his back was really bothering/hurting him General Notes: Wound exam;; the area on the left posterior calf is separating. I did not to serve this today ooLeft lateral malleolus requires debridement with a #3 curette I am able to get to a healthy surface but this is not filling in. Integumentary (Hair, Skin) Wound #4 status is Open. Original cause of wound was Trauma. The wound is located on the Left,Lateral Malleolus. The wound measures 1.1cm length x 1.1cm width x 0.7cm depth; 0.95cm^2 area and 0.665cm^3 volume. There is Fat Layer (Subcutaneous Tissue) Exposed exposed. There is no tunneling or undermining noted. There is a small amount of serosanguineous drainage noted. The wound margin is well defined and not attached to the wound base. There is medium (34-66%) pink, pale granulation within the wound bed. There is a medium (34-66%) amount of necrotic tissue within the wound bed including Adherent Slough. Wound #5 status is Open. Original cause of wound was Gradually Appeared. The wound is located on the Left,Posterior Calf. The wound measures 3cm length x 2cm width x 0.2cm depth; 4.712cm^2 area and 0.942cm^3 volume. There is Fat Layer (Subcutaneous Tissue) Exposed exposed. There is no tunneling or undermining noted. There is a medium amount of serosanguineous drainage noted. The wound margin is indistinct and nonvisible. There is large (67-100%) red, hyper - granulation within the wound bed. There is no necrotic tissue within the wound bed. 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 There was a Excisional Skin/Subcutaneous Tissue Debridement with a total area of 1.21 sq cm performed by Ricard Dillon., MD. With the following instrument(s): Curette to remove Viable and Non-Viable tissue/material. Material removed includes Subcutaneous Tissue and Slough and after achieving pain control using Other (Benzocaine 20%). No specimens were taken. A time out was conducted at 17:00, prior to the start of the procedure. A Minimum amount of bleeding was controlled with Pressure. The procedure was tolerated well with a pain level of 5 throughout and a pain level of 4 following the procedure. Post Debridement Measurements: 1.1cm length x 1.1cm width x 0.7cm depth; 0.665cm^3 volume. Character of Wound/Ulcer Post Debridement is improved. Severity of Tissue Post Debridement is: Fat layer exposed. Post procedure Diagnosis Wound #4: Same as Pre-Procedure Pre-procedure diagnosis of Wound #4 is a Venous Leg Ulcer located on the Left,Lateral Malleolus . There was a Three Layer Compression Therapy Procedure by [name], RN. Post procedure Diagnosis Wound #4: Same as Pre-Procedure Plan Follow-up Appointments: Return Appointment in 1 week. Dressing Change Frequency: Do not change entire dressing for one week. Skin Barriers/Peri-Wound Care: TCA Cream or Ointment - liberal to lower leg Wound Cleansing: May shower with protection. Primary Wound Dressing: Wound #4 Left,Lateral Malleolus: Endoform - moisten with saline, use small piece of gauze packing behind endoform Wound #5 Left,Posterior Calf: Foam Secondary Dressing: Wound #4 Left,Lateral Malleolus: Dry Gauze Edema Control: 3 Layer Compression System - Left Lower Extremity - no cotton use  kerlix. Avoid standing for long periods of time Elevate legs to the level of the heart or above for 30 minutes daily and/or when sitting, a frequency of: - throughout the day Exercise regularly Additional Orders / Instructions: Wound #5 Left,Posterior Calf: Other: - Patient to keep scheduled appointment to have skin cancer removed from left posterior lower leg. 1. Continue with endoform 2. Fortunately he finally has an appointment to deal with the malignancy in the posterior calf. We will see if this heals 3. He did have his arterial status evaluated by Dr. Doren Custard who felt that no further interventions were necessary. I am not sure where the reflux assessment was in this Electronic Signature(s) Signed: 03/05/2019 6:19:43 PM By: Linton Ham MD Entered By: Linton Ham on 03/05/2019 17:55:35 -------------------------------------------------------------------------------- SuperBill Details Patient Name: Date of Service: [name], [name] 03/05/2019 Medical Record [id number] Patient Account Number: [id number] Date of Birth/Sex: Treating RN: December 20, 1950 (68 y.o. [name] Primary Care Provider: PATIENT, NO Other Clinician: Referring Provider: Treating Provider/Extender:Bellamarie Pflug, [name], Designated Weeks in Treatment: 15 Diagnosis Coding ICD-10 Codes Code Description [phone] Chronic venous hypertension (idiopathic) with ulcer and inflammation of left lower extremity L97.321 Non-pressure chronic ulcer of left ankle limited to breakdown of skin C44.799 Other specified malignant neoplasm of skin of left lower limb, including hip Facility Procedures CPT4 Code Description: [id number] - DEB SUBQ TISSUE 20 SQ CM/< ICD-10 Diagnosis Description L97.321 Non-pressure chronic ulcer of left ankle limited to brea Modifier: kdown of skin Quantity: 1 Physician Procedures CPT4 Code Description: [id number] - WC PHYS SUBQ TISS 20 SQ CM ICD-10 Diagnosis Description L97.321  Non-pressure chronic ulcer of left ankle limited to brea Modifier: kdown of skin Quantity: 1 Electronic Signature(s) Signed: 03/05/2019 6:19:43 PM By: Linton Ham MD Entered By: Linton Ham on 03/05/2019 17:56:01

## 2019-03-11 ENCOUNTER — Other Ambulatory Visit: Payer: Self-pay

## 2019-03-11 ENCOUNTER — Encounter (HOSPITAL_BASED_OUTPATIENT_CLINIC_OR_DEPARTMENT_OTHER): Payer: Medicare Other | Admitting: Internal Medicine

## 2019-03-11 DIAGNOSIS — I87332 Chronic venous hypertension (idiopathic) with ulcer and inflammation of left lower extremity: Secondary | ICD-10-CM | POA: Diagnosis not present

## 2019-03-11 NOTE — Progress Notes (Addendum)
[name] ([id number]) Visit Report for 03/11/2019 Arrival Information Details Patient Name: Date of Service: [name], [name] 03/11/2019 3:45 PM Medical Record [id number] Patient Account Number: [id number] Date of Birth/Sex: Treating RN: 1950/08/04 (68 y.o. [name] Primary Care Daelyn Mozer: PATIENT, NO Other Clinician: Referring Lauralee Waters: Treating Shontez Sermon/Extender:Robson, [name], Designated Weeks in Treatment: 15 Visit Information History Since Last Visit Added or deleted any medications: No Patient Arrived: [name] Any new allergies or adverse reactions: No Arrival Time: 16:04 Had a fall or experienced change in No Accompanied By: self activities of daily living that may affect Transfer Assistance: None risk of falls: Patient Identification Verified: Yes Signs or symptoms of abuse/neglect since last No Secondary Verification Process Yes visito Completed: Hospitalized since last visit: No Patient Requires Transmission- No Implantable device outside of the clinic excluding No Based Precautions: cellular tissue based products placed in the center Patient Has Alerts: Yes ABIs 08/2018 L1.05 since last visit: Patient Alerts: Has Dressing in Place as Prescribed: Yes R0.76 Has Compression in Place as Prescribed: Yes Pain Present Now: No Electronic Signature(s) Signed: 03/11/2019 4:29:59 PM By: [name] Entered By: [name] on 03/11/2019 16:06:04 -------------------------------------------------------------------------------- Compression Therapy Details Patient Name: Date of Service: [name], [name] 03/11/2019 3:45 PM Medical Record [id number] Patient Account Number: [id number] Date of Birth/Sex: Treating RN: May 20, 1951 (68 y.o. [name] Primary Care Elisandra Deshmukh: PATIENT, NO Other Clinician: Referring Sisto Granillo: Treating Madelina Sanda/Extender:Robson, [name], Designated Weeks in Treatment: 15 Compression  Therapy Performed for Wound Wound #4 Left,Lateral Malleolus Assessment: Performed By: Clinician [name], RN Compression Type: Three Layer Pre Treatment ABI: 1 Post Procedure Diagnosis Same as Pre-procedure Electronic Signature(s) Signed: 03/11/2019 6:06:48 PM By: [name] Entered By: [name] on 03/11/2019 16:56:32 -------------------------------------------------------------------------------- Compression Therapy Details Patient Name: Date of Service: [name], [name] 03/11/2019 3:45 PM Medical Record [id number] Patient Account Number: [id number] Date of Birth/Sex: Treating RN: 10-29-1950 (68 y.o. [name] Primary Care Ananya Mccleese: PATIENT, NO Other Clinician: Referring Selwyn Reason: Treating Khang Hannum/Extender:Robson, [name], Designated Weeks in Treatment: 15 Compression Therapy Performed for Wound Wound #5 Left,Posterior Calf Assessment: Performed By: Clinician [name], RN Compression Type: Three Layer Pre Treatment ABI: 1 Post Procedure Diagnosis Same as Pre-procedure Electronic Signature(s) Signed: 03/11/2019 6:06:48 PM By: [name] Entered By: [name] on 03/11/2019 16:56:32 -------------------------------------------------------------------------------- Encounter Discharge Information Details Patient Name: Date of Service: [name], [name] 03/11/2019 3:45 PM Medical Record [id number] Patient Account Number: [id number] Date of Birth/Sex: Treating RN: February 11, 1951 (68 y.o. [name] Primary Care Kejon Feild: PATIENT, NO Other Clinician: Referring [name]: Treating Stephani Janak/Extender:Robson, [name], Designated Weeks in Treatment: 15 Encounter Discharge Information Items Discharge Condition: Stable Ambulatory Status: Cane Discharge Destination: Home Transportation: Other Accompanied By: self Schedule Follow-up Appointment: Yes Clinical Summary of Care: Patient Declined Electronic  Signature(s) Signed: 03/12/2019 5:13:27 PM By: [name] Entered By: [name] on 03/11/2019 17:21:52 -------------------------------------------------------------------------------- Lower Extremity Assessment Details Patient Name: Date of Service: [name], [name] 03/11/2019 3:45 PM Medical Record [id number] Patient Account Number: [id number] Date of Birth/Sex: Treating RN: March 29, 1951 (68 y.o. [name] Primary Care Deagen Krass: PATIENT, NO Other Clinician: Referring Shizue Kaseman: Treating Liesl Simons/Extender:Robson, [name], Designated Weeks in Treatment: 15 Edema Assessment Assessed: [Left: No] [Right: No] Edema: [Left: Yes] [Right: No] Calf Left: Right: Point of Measurement: 35 cm From Medial Instep 34 cm cm Ankle Left: Right: Point of Measurement: 9 cm From Medial Instep 26.2 cm cm Vascular Assessment Pulses: Dorsalis Pedis Palpable: [Left:Yes] Electronic Signature(s) Signed: 03/11/2019 4:29:59 PM By: [name] Entered  By: [name] on 03/11/2019 16:14:57 -------------------------------------------------------------------------------- Multi Wound Chart Details Patient Name: Date of Service: [name], [name] 03/11/2019 3:45 PM Medical Record [id number] Patient Account Number: [id number] Date of Birth/Sex: Treating RN: February 01, 1951 (67 y.o. [name] Primary Care Orit Sanville: PATIENT, NO Other Clinician: Referring Kaeden Depaz: Treating Cleatus Goodin/Extender:Robson, [name], Designated Weeks in Treatment: 15 Vital Signs Height(in): 73 Pulse(bpm): Weight(lbs): 205 Blood Pressure(mmHg): Body Mass Index(BMI): 27 Temperature(F): 98.5 Respiratory 17 Rate(breaths/min): Photos: [4:No Photos] [5:No Photos] [N/A:N/A] Wound Location: [4:Left Malleolus - Lateral] [5:Left Calf - Posterior] [N/A:N/A] Wounding Event: [4:Trauma] [5:Gradually Appeared] [N/A:N/A] Primary Etiology: [4:Venous Leg Ulcer] [5:Atypical]  [N/A:N/A] Comorbid History: [4:Arrhythmia, Coronary Artery Arrhythmia, Coronary Artery N/A Disease, Type II Diabetes Disease, Type II Diabetes] Date Acquired: [4:11/02/2018] [5:10/02/2018] [N/A:N/A] Weeks of Treatment: [4:15] [5:15] [N/A:N/A] Wound Status: [4:Open] [5:Open] [N/A:N/A] Measurements L x W x D 1x1x0.8 [5:2.5x2x0.2] [N/A:N/A] (cm) Area (cm) : [4:0.785] [5:3.927] [N/A:N/A] Volume (cm) : [4:0.628] [5:0.785] [N/A:N/A] % Reduction in Area: [4:39.40%] [5:-363.10%] [N/A:N/A] % Reduction in Volume: -383.10% [5:-823.50%] [N/A:N/A] Classification: [4:Full Thickness Without Exposed Support Structures Exposed Support Structures] [5:Full Thickness Without] [N/A:N/A] Exudate Amount: [4:Small] [5:Medium] [N/A:N/A] Exudate Type: [4:Serosanguineous] [5:Serosanguineous] [N/A:N/A] Exudate Color: [4:red, brown] [5:red, brown] [N/A:N/A] Wound Margin: [4:Well defined, not attached Indistinct, nonvisible] [N/A:N/A] Granulation Amount: [4:Small (1-33%)] [5:Large (67-100%)] [N/A:N/A] Granulation Quality: [4:Pink, Pale] [5:Red, Hyper-granulation] [N/A:N/A] Necrotic Amount: [4:Large (67-100%)] [5:None Present (0%)] [N/A:N/A] Exposed Structures: [4:Fat Layer (Subcutaneous Fat Layer (Subcutaneous N/A Tissue) Exposed: Yes Fascia: No Tendon: No Muscle: No Joint: No Bone: No] [5:Tissue) Exposed: Yes Fascia: No Tendon: No Muscle: No Joint: No Bone: No] Epithelialization: [4:Small (1-33%)] [5:None Compression Therapy] [N/A:N/A N/A] Treatment Notes Wound #4 (Left, Lateral Malleolus) 1. Cleanse With Wound Cleanser Soap and water 2. Periwound Care TCA Cream 3. Primary Dressing Applied Other primary dressing (specifiy in notes) 4. Secondary Dressing Dry Gauze 6. Support Layer Applied 3 layer compression wrap Notes . endoform. adhesive foam applied to posterior wound Wound #5 (Left, Posterior Calf) 1. Cleanse With Wound Cleanser Soap and water 2. Periwound Care TCA Cream 4. Secondary Dressing Dry  Gauze Foam Border Dressing 6. Support Layer Applied 3 layer compression wrap Notes adhesive foam applied to posterior wound. stretch net Electronic Signature(s) Signed: 03/11/2019 5:49:31 PM By: Linton Ham MD Signed: 03/11/2019 6:06:48 PM By: [name] Entered By: Linton Ham on 03/11/2019 17:34:36 -------------------------------------------------------------------------------- Pain Assessment Details Patient Name: Date of Service: [name], [name] 03/11/2019 3:45 PM Medical Record [id number] Patient Account Number: [id number] Date of Birth/Sex: Treating RN: 1951-01-02 (68 y.o. [name] Primary Care Skeeter Sheard: PATIENT, NO Other Clinician: Referring Maybelle Depaoli: Treating Malania Gawthrop/Extender:Robson, [name], Designated Weeks in Treatment: 15 Active Problems Location of Pain Severity and Description of Pain Patient Has Paino No Site Locations Pain Management and Medication Current Pain Management: Electronic Signature(s) Signed: 03/11/2019 4:29:59 PM By: [name] Entered By: [name] on 03/11/2019 16:07:46 -------------------------------------------------------------------------------- Wound Assessment Details Patient Name: Date of Service: [name], [name] 03/11/2019 3:45 PM Medical Record [id number] Patient Account Number: [id number] Date of Birth/Sex: Treating RN: [birth year] (68 y.o. [name] Primary Care Mandalyn Pasqua: PATIENT, NO Other Clinician: Referring Geanie Pacifico: Treating Aleric Froelich/Extender:Robson, [name], Designated Weeks in Treatment: 15 Wound Status Wound Number: 4 Primary Venous Leg Ulcer Etiology: Wound Location: Left Malleolus - Lateral Wound Open Wounding Event: Trauma Status: Date Acquired: 11/02/2018 Comorbid Arrhythmia, Coronary Artery Disease, Weeks Of Treatment: 15 History: Type II Diabetes Clustered Wound: No Photos Wound Measurements Length: (cm) 1 % Reduct Width: (cm) 1 %  Reduct Depth: (cm) 0.8  [place] Area: (cm) 0.785 [place] Volume: (cm) 0.628 Undermi Wound Description Classification: Full Thickness Without Exposed Support Foul Odo Structures Slough/F Wound Well defined, not attached Margin: Exudate Small Amount: Exudate Serosanguineous Serosanguineous Type: Exudate red, brown Color: Wound Bed Granulation Amount: Small (1-33%) Granulation Quality: Pink, Pale Fascia Ex Necrotic Amount: Large (67-100%) Fat Layer Necrotic Quality: Adherent Slough Tendon Ex Muscle Ex Joint Exp Bone Expo r After Cleansing: No ibrino Yes Exposed Structure posed: No (Subcutaneous Tissue) Exposed: Yes posed: No posed: No osed: No sed: No ion in Area: 39.4% ion in Volume: -383.1% alization: Small (1-33%) ng: No ning: No Treatment Notes Wound #4 (Left, Lateral Malleolus) 1. Cleanse With Wound Cleanser Soap and water 2. Periwound Care TCA Cream 3. Primary Dressing Applied Other primary dressing (specifiy in notes) 4. Secondary Dressing Dry Gauze 6. Support Layer Applied 3 layer compression wrap Notes . endoform. adhesive foam applied to posterior wound Electronic Signature(s) Signed: 03/12/2019 5:13:27 PM By: [name] Signed: 03/15/2019 3:43:33 PM By: Mikeal Hawthorne EMT/HBOT Previous Signature: 03/11/2019 4:29:59 PM Version By: [name] Entered By: Mikeal Hawthorne on 03/12/2019 08:51:23 -------------------------------------------------------------------------------- Wound Assessment Details Patient Name: Date of Service: [name], [name] 03/11/2019 3:45 PM Medical Record [id number] Patient Account Number: [id number] Date of Birth/Sex: Treating RN: 1950-11-09 (68 y.o. [name] Primary Care Gwendoline Judy: PATIENT, NO Other Clinician: Referring Kunal Levario: Treating Rital Cavey/Extender:Robson, [name], Designated Weeks in Treatment: 15 Wound Status Wound Number: 5 Primary Atypical Etiology: Wound  Location: Left Calf - Posterior Wound Open Wounding Event: Gradually Appeared Status: Date Acquired: 10/02/2018 Comorbid Arrhythmia, Coronary Artery Disease, Weeks Of Treatment: 15 History: Type II Diabetes Clustered Wound: No Photos Wound Measurements Length: (cm) 2.5 % Reduct Width: (cm) 2 % Reduct Depth: (cm) 0.2 Epitheli Area: (cm) 3.927 [place] Volume: (cm) 0.785 Undermi Wound Description Classification: Full Thickness Without Exposed Support Foul Odo Structures Slough/F Wound Indistinct, nonvisible Margin: Exudate Medium Amount: Exudate Serosanguineous Type: Exudate red, brown Color: Wound Bed Granulation Amount: Large (67-100%) Granulation Quality: Red, Hyper-granulation Fascia E Necrotic Amount: None Present (0%) Fat Laye Tendon E Muscle E Joint Ex Bone Exp r After Cleansing: No ibrino No Exposed Structure xposed: No r (Subcutaneous Tissue) Exposed: Yes xposed: No xposed: No posed: No osed: No ion in Area: -363.1% ion in Volume: -823.5% alization: None ng: No ning: No Treatment Notes Wound #5 (Left, Posterior Calf) 1. Cleanse With Wound Cleanser Soap and water 2. Periwound Care TCA Cream 4. Secondary Dressing Dry Gauze Foam Border Dressing 6. Support Layer Applied 3 layer compression wrap Notes adhesive foam applied to posterior wound. stretch net Electronic Signature(s) Signed: 03/12/2019 5:13:27 PM By: [name] Signed: 03/15/2019 3:43:33 PM By: Mikeal Hawthorne EMT/HBOT Previous Signature: 03/11/2019 4:29:59 PM Version By: [name] Previous Signature: 03/11/2019 4:29:59 PM Version By: [name] Entered By: Mikeal Hawthorne on 03/12/2019 08:51:47 -------------------------------------------------------------------------------- Vitals Details Patient Name: Date of Service: [name], [name] 03/11/2019 3:45 PM Medical Record [id number] Patient Account Number: [id number] Date of Birth/Sex: Treating  RN: 26-May-1951 (68 y.o. [name] Primary Care Kailie Polus: PATIENT, NO Other Clinician: Referring Custer Pimenta: Treating Chella Chapdelaine/Extender:Robson, [name], Designated Weeks in Treatment: 15 Vital Signs Time Taken: 16:05 Temperature (F): 98.5 Height (in): 73 Respiratory Rate (breaths/min): 17 Weight (lbs): 205 Reference Range: 80 - 120 mg / dl Body Mass Index (BMI): 27 Electronic Signature(s) Signed: 03/11/2019 4:29:59 PM By: [name] Entered By: [name] on 03/11/2019 16:07:35

## 2019-03-11 NOTE — Progress Notes (Signed)
IVIS, BERENDS ([id number]) Visit Report for 03/05/2019 Arrival Information Details Patient Name: Date of Service: [name], [name] 03/05/2019 3:00 PM Medical Record [id number] Patient Account Number: [id number] Date of Birth/Sex: Treating RN: 1951/05/17 (68 y.o. [name] Primary Care Azaliyah Kennard: PATIENT, NO Other Clinician: Referring Allene Furuya: Treating Kaesyn Johnston/Extender:Robson, [name], Designated Weeks in Treatment: 15 Visit Information History Since Last Visit Added or deleted any medications: No Patient Arrived: Ambulatory Any new allergies or adverse reactions: No Arrival Time: 15:54 Had a fall or experienced change in No Accompanied By: self activities of daily living that may affect Transfer Assistance: None risk of falls: Patient Identification Verified: Yes Signs or symptoms of abuse/neglect since last No Secondary Verification Process Yes visito Completed: Hospitalized since last visit: No Patient Requires Transmission- No Implantable device outside of the clinic excluding No Based Precautions: cellular tissue based products placed in the center Patient Has Alerts: Yes ABIs 08/2018 L1.05 since last visit: Patient Alerts: Has Dressing in Place as Prescribed: Yes R0.76 Has Compression in Place as Prescribed: Yes Pain Present Now: No Electronic Signature(s) Signed: 03/11/2019 4:29:59 PM By: [name] Entered By: [name] on 03/05/2019 15:56:17 -------------------------------------------------------------------------------- Compression Therapy Details Patient Name: Date of Service: [name], [name] 03/05/2019 3:00 PM Medical Record [id number] Patient Account Number: [id number] Date of Birth/Sex: Treating RN: Oct 14, 1950 (68 y.o. [name] Primary Care Hansford Hirt: PATIENT, NO Other Clinician: Referring Shalayne Leach: Treating Weber Monnier/Extender:Robson, [name], Designated Weeks in Treatment:  15 Compression Therapy Performed for Wound Wound #4 Left,Lateral Malleolus Assessment: Performed By: Clinician [name], RN Compression Type: Three Layer Post Procedure Diagnosis Same as Pre-procedure Electronic Signature(s) Signed: 03/05/2019 7:05:19 PM By: Baruch Gouty RN, BSN Entered By: Baruch Gouty on 03/05/2019 17:02:29 -------------------------------------------------------------------------------- Encounter Discharge Information Details Patient Name: Date of Service: [name], [name] 03/05/2019 3:00 PM Medical Record [id number] Patient Account Number: [id number] Date of Birth/Sex: Treating RN: December 13, 1950 (68 y.o. [name] Primary Care Kym Fenter: PATIENT, NO Other Clinician: Referring Juan Olthoff: Treating Francenia Chimenti/Extender:Robson, [name], Designated Weeks in Treatment: 15 Encounter Discharge Information Items Post Procedure Vitals Discharge Condition: Stable Temperature (F): 98.6 Ambulatory Status: Cane Pulse (bpm): 75 Discharge Destination: Home Respiratory Rate (breaths/min): 18 Transportation: Private Auto Blood Pressure (mmHg): 177/92 Accompanied By: self Schedule Follow-up Appointment: Yes Clinical Summary of Care: Patient Declined Electronic Signature(s) Signed: 03/11/2019 4:29:59 PM By: [name] Entered By: [name] on 03/05/2019 17:31:20 -------------------------------------------------------------------------------- Lower Extremity Assessment Details Patient Name: Date of Service: [name], [name] 03/05/2019 3:00 PM Medical Record [id number] Patient Account Number: [id number] Date of Birth/Sex: Treating RN: 07-Mar-1951 (68 y.o. [name] Primary Care Alejah Aristizabal: PATIENT, NO Other Clinician: Referring Trevonte Ashkar: Treating Jadarian Mckay/Extender:Robson, [name], Designated Weeks in Treatment: 15 Edema Assessment Assessed: [Left: No] [Right: No] Edema: [Left: Yes] [Right: No] Calf Left:  Right: Point of Measurement: 35 cm From Medial Instep 34 cm cm Ankle Left: Right: Point of Measurement: 9 cm From Medial Instep 26 cm cm Vascular Assessment Pulses: Dorsalis Pedis Palpable: [Left:Yes] Electronic Signature(s) Signed: 03/11/2019 4:29:59 PM By: [name] Entered By: [name] on 03/05/2019 16:04:05 -------------------------------------------------------------------------------- Multi Wound Chart Details Patient Name: Date of Service: Michael He[place]. 03/05/2019 3:00 PM Medical Record [id number] Patient Account Number: [id number] Date of Birth/Sex: Treating RN: Feb 14, 1951 (68 y.o. [name] Primary Care Syed Zukas: PATIENT, NO Other Clinician: Referring Lakeyia Surber: Treating Pamela Intrieri/Extender:Robson, [name], Designated Weeks in Treatment: 15 Vital Signs Height(in): 73 Pulse(bpm): 75 Weight(lbs): 205 Blood Pressure(mmHg): 177/92 Body Mass Index(BMI): 27 Temperature(F): 98.6 Respiratory 18 Rate(breaths/min): Photos: [4:No Photos] [  5:No Photos] [N/A:N/A] Wound Location: [4:Left Malleolus - Lateral] [5:Left Calf - Posterior] [N/A:N/A] Wounding Event: [4:Trauma] [5:Gradually Appeared] [N/A:N/A] Primary Etiology: [4:Venous Leg Ulcer] [5:Atypical] [N/A:N/A] Comorbid History: [4:Arrhythmia, Coronary Artery Arrhythmia, Coronary Artery N/A Disease, Type II Diabetes Disease, Type II Diabetes] Date Acquired: [4:11/02/2018] [5:10/02/2018] [N/A:N/A] Weeks of Treatment: [4:15] [5:15] [N/A:N/A] Wound Status: [4:Open] [5:Open] [N/A:N/A] Measurements L x W x D 1.1x1.1x0.7 [5:3x2x0.2] [N/A:N/A] (cm) Area (cm) : [4:0.95] [5:4.712] [N/A:N/A] Volume (cm) : [4:0.665] [5:0.942] [N/A:N/A] % Reduction in Area: [4:26.70%] [5:-455.70%] [N/A:N/A] % Reduction in Volume: -411.50% [5:-1008.20%] [N/A:N/A] Classification: [4:Full Thickness Without Exposed Support StructuresExposed Support Structures] [5:Full Thickness Without] [N/A:N/A] Exudate Amount:  [4:Small] [5:Medium] [N/A:N/A] Exudate Type: [4:Serosanguineous] [5:Serosanguineous] [N/A:N/A] Exudate Color: [4:red, brown] [5:red, brown] [N/A:N/A] Wound Margin: [4:Well defined, not attached Indistinct, nonvisible] [N/A:N/A] Granulation Amount: [4:Medium (34-66%)] [5:Large (67-100%)] [N/A:N/A] Granulation Quality: [4:Pink, Pale] [5:Red, Hyper-granulation] [N/A:N/A] Necrotic Amount: [4:Medium (34-66%)] [5:None Present (0%)] [N/A:N/A] Exposed Structures: [4:Fat Layer (Subcutaneous Fat Layer (Subcutaneous N/A Tissue) Exposed: Yes Fascia: No Tendon: No Muscle: No Joint: No Bone: No] [5:Tissue) Exposed: Yes Fascia: No Tendon: No Muscle: No Joint: No Bone: No] Epithelialization: [4:Small (1-33%)] [5:None] [N/A:N/A] Debridement: [4:Debridement - Excisional N/A] [N/A:N/A] Pre-procedure [4:17:00] [5:N/A] [N/A:N/A] Verification/Time Out Taken: Pain Control: [4:Other] [5:N/A] [N/A:N/A] Tissue Debrided: [4:Subcutaneous, Slough] [5:N/A] [N/A:N/A] Level: [4:Skin/Subcutaneous Tissue] [5:N/A] [N/A:N/A] Debridement Area (sq cm):1.21 [5:N/A] [N/A:N/A] Instrument: [4:Curette] [5:N/A] [N/A:N/A] Bleeding: [4:Minimum] [5:N/A] [N/A:N/A] Hemostasis Achieved: [4:Pressure] [5:N/A] [N/A:N/A] Procedural Pain: [4:5] [5:N/A] [N/A:N/A] Post Procedural Pain: [4:4] [5:N/A] [N/A:N/A] Debridement Treatment Procedure was tolerated [5:N/A] [N/A:N/A] Response: [4:well] Post Debridement [4:1.1x1.1x0.7] [5:N/A] [N/A:N/A] Measurements L x W x D (cm) Post Debridement [4:0.665] [5:N/A] [N/A:N/A] Volume: (cm) Procedures Performed: Compression Therapy [4:Debridement] [5:N/A] [N/A:N/A] Treatment Notes Wound #4 (Left, Lateral Malleolus) 1. Cleanse With Wound Cleanser Soap and water 4. Secondary Dressing Dry Gauze Foam Border Dressing 6. Support Layer Applied 3 layer compression wrap Notes adhesive foam applied to posterior wound Wound #5 (Left, Posterior Calf) 1. Cleanse With Wound Cleanser Soap and water 2.  Periwound Care TCA Cream 3. Primary Dressing Applied Endoform 4. Secondary Dressing Dry Gauze 6. Support Layer Applied 3 layer compression wrap Notes adhesive foam applied to posterior wound. stretch net Electronic Signature(s) Signed: 03/05/2019 6:19:43 PM By: Linton Ham MD Signed: 03/05/2019 7:05:19 PM By: Baruch Gouty RN, BSN Entered By: Linton Ham on 03/05/2019 17:48:21 -------------------------------------------------------------------------------- Pain Assessment Details Patient Name: Date of Service: [name], [name] 03/05/2019 3:00 PM Medical Record [id number] Patient Account Number: [id number] Date of Birth/Sex: Treating RN: Dec 19, 1950 (68 y.o. [name] Primary Care Yoshimi Sarr: PATIENT, NO Other Clinician: Referring Aloysious Vangieson: Treating Anani Gu/Extender:Robson, [name], Designated Weeks in Treatment: 15 Active Problems Location of Pain Severity and Description of Pain Patient Has Paino No Site Locations Pain Management and Medication Current Pain Management: Electronic Signature(s) Signed: 03/11/2019 4:29:59 PM By: [name] Entered By: [name] on 03/05/2019 15:59:35 -------------------------------------------------------------------------------- Wound Assessment Details Patient Name: Date of Service: [name], [name] 03/05/2019 3:00 PM Medical Record [id number] Patient Account Number: [id number] Date of Birth/Sex: Treating RN: Jan 16, 1951 (68 y.o. [name] Primary Care Deshara Rossi: PATIENT, NO Other Clinician: Referring Baran Kuhrt: Treating Taym Twist/Extender:Robson, [name], Designated Weeks in Treatment: 15 Wound Status Wound Number: 4 Primary Venous Leg Ulcer Etiology: Wound Location: Left Malleolus - Lateral Wound Open Wounding Event: Trauma Status: Date Acquired: 11/02/2018 Comorbid Arrhythmia, Coronary Artery Disease, Weeks Of Treatment: 15 History: Type II Diabetes Clustered  Wound: No Photos Wound Measurements Length: (cm) 1.1 % Reduct Width: (cm) 1.1 % Reduct Depth: (cm)  0.7 Epitheli Area: (cm) 0.95 Tunneli Volume: (cm) 0.665 Undermi Wound Description Classification: Full Thickness Without Exposed Support Foul Odo Structures Slough/F Wound Well defined, not attached Margin: Exudate Small Amount: Exudate Serosanguineous Type: Exudate red, brown Color: Wound Bed Granulation Amount: Medium (34-66%) Granulation Quality: Pink, Pale Fascia E Necrotic Amount: Medium (34-66%) Fat Laye Necrotic Quality: Adherent Slough Tendon E Muscle E Joint Ex Bone Exp Electronic Signature(s) Signed: 03/08/2019 4:14:10 PM By: Mikeal Hawthorne EMT/HBOT Signed: 03/11/2019 4:29:59 PM By: [name] Entered By: Mikeal Hawthorne on 10/05 r After Cleansing: No ibrino Yes Exposed Structure xposed: No r (Subcutaneous Tissue) Exposed: Yes xposed: No xposed: No posed: No osed: No /2020 10:37:18 ion in Area: 26.7% ion in Volume: -411.5% alization: Small (1-33%) ng: No ning: No -------------------------------------------------------------------------------- Wound Assessment Details Patient Name: Date of Service: [name], [name] 03/05/2019 3:00 PM Medical Record [id number] Patient Account Number: [id number] Date of Birth/Sex: Treating RN: [birth year] (68 y.o. [name] Primary Care Rissa Turley: PATIENT, NO Other Clinician: Referring Layani Foronda: Treating Almira Phetteplace/Extender:Robson, [name], Designated Weeks in Treatment: 15 Wound Status Wound Number: 5 Primary Atypical Etiology: Wound Location: Left Calf - Posterior Wound Open Wounding Event: Gradually Appeared Status: Date Acquired: 10/02/2018 Comorbid Arrhythmia, Coronary Artery Disease, Weeks Of Treatment: 15 History: Type II Diabetes Clustered Wound: No Photos Wound Measurements Length: (cm) 3 % Reduct Width: (cm) 2 % Reduct Depth: (cm) 0.2 Epitheli Area: (cm) 4.712  Tunneli Volume: (cm) 0.942 Undermi Wound Description Classification: Full Thickness Without Exposed Support Foul Odo Structures Slough/F Wound Indistinct, nonvisible Margin: Exudate Medium Amount: Exudate Serosanguineous Type: Exudate red, brown Color: Wound Bed Granulation Amount: Large (67-100%) Granulation Quality: Red, Hyper-granulation Fascia Necrotic Amount: None Present (0%) Fat La Tendon Muscle Joint Bone E r After Cleansing: No ibrino No Exposed Structure Exposed: No yer (Subcutaneous Tissue) Exposed: Yes Exposed: No Exposed: No Exposed: No xposed: No ion in Area: -455.7% ion in Volume: -1008.2% alization: None ng: No ning: No Electronic Signature(s) Signed: 03/08/2019 4:14:10 PM By: Mikeal Hawthorne EMT/HBOT Signed: 03/11/2019 4:29:59 PM By: [name] Entered By: Mikeal Hawthorne on 03/08/2019 10:37:43 -------------------------------------------------------------------------------- Vitals Details Patient Name: Date of Service: Michael Post F. 03/05/2019 3:00 PM Medical Record [id number] Patient Account Number: [id number] Date of Birth/Sex: Treating RN: 01-Nov-1950 (68 y.o. [name] Primary Care Kamarie Veno: PATIENT, NO Other Clinician: Referring Sheetal Lyall: Treating Mariaisabel Bodiford/Extender:Robson, [name], Designated Weeks in Treatment: 15 Vital Signs Time Taken: 15:56 Temperature (F): 98.6 Height (in): 73 Pulse (bpm): 75 Weight (lbs): 205 Respiratory Rate (breaths/min): 18 Body Mass Index (BMI): 27 Blood Pressure (mmHg): 177/92 Reference Range: 80 - 120 mg / dl Notes no signs and symptoms of headache, dizziness ringing in the ear. Patient stated his back was really bothering/hurting him Electronic Signature(s) Signed: 03/11/2019 4:29:59 PM By: [name] Entered By: [name] on 03/05/2019 15:59:26

## 2019-03-11 NOTE — Progress Notes (Signed)
ERICH, RICKE ([id number]) Visit Report for 02/25/2019 Arrival Information Details Patient Name: Date of Service: Raphiel, Cockayne 02/25/2019 3:00 PM Medical Record [id number] Patient Account Number: [id number] Date of Birth/Sex: Treating RN: 04-04-1951 (68 y.o. [name], Meta.Reding Primary Care Delancey Moraes: PATIENT, NO Other Clinician: Referring Cleatus Goodin: Treating Bleu Moisan/Extender:Robson, [name], Designated Weeks in Treatment: 13 Visit Information History Since Last Visit Added or deleted any medications: No Patient Arrived: [name] Any new allergies or adverse reactions: No Arrival Time: 16:33 Had a fall or experienced change in No Accompanied By: self activities of daily living that may affect Transfer Assistance: None risk of falls: Patient Identification Verified: Yes Signs or symptoms of abuse/neglect since last No Secondary Verification Process Yes visito Completed: Hospitalized since last visit: No Patient Requires Transmission- No Implantable device outside of the clinic excluding No Based Precautions: cellular tissue based products placed in the center Patient Has Alerts: Yes ABIs 08/2018 L1.05 since last visit: Patient Alerts: Has Dressing in Place as Prescribed: Yes R0.76 Pain Present Now: Yes Electronic Signature(s) Signed: 03/11/2019 4:32:31 PM By: [name] Entered By: [name] on 02/25/2019 16:47:17 -------------------------------------------------------------------------------- Complex / Palliative Patient Assessment Details Patient Name: Date of Service: Harrell, Alikhan 02/25/2019 3:00 PM Medical Record [id number] Patient Account Number: [id number] Date of Birth/Sex: Treating RN: 1951/04/30 (68 y.o. [name] Primary Care Amilyah Nack: PATIENT, NO Other Clinician: Referring Ryder Man: Treating Jillisa Harris/Extender:Robson, [name], Designated Weeks in Treatment: 13 Palliative Management Criteria Complex  Wound Management Criteria Patient has remarkable or complex co-morbidities requiring medications or treatments that extend wound healing times. Examples: Diabetes mellitus with chronic renal failure or end stage renal disease requiring dialysis Advanced or poorly controlled rheumatoid arthritis Diabetes mellitus and end stage chronic obstructive pulmonary disease Active cancer with current chemo- or radiation therapy Malignant wound, PVD, CAD Care Approach Wound Care Plan: Complex Wound Management Electronic Signature(s) Signed: 02/26/2019 4:52:57 PM By: Linton Ham MD Signed: 03/01/2019 6:25:06 PM By: [name] RN, BSN Entered By: [name] on 02/26/2019 09:40:07 -------------------------------------------------------------------------------- Compression Therapy Details Patient Name: Date of Service: Andrewjohn, Toller 02/25/2019 3:00 PM Medical Record [id number] Patient Account Number: [id number] Date of Birth/Sex: Treating RN: August 21, 1950 (68 y.o. [name] Primary Care Raliyah Montella: PATIENT, NO Other Clinician: Referring Kamalani Mastro: Treating Harmonii Karle/Extender:Robson, [name], Designated Weeks in Treatment: 13 Compression Therapy Performed for Wound Wound #4 Left,Lateral Malleolus Assessment: Performed By: Clinician [name], RN Compression Type: Three Layer Post Procedure Diagnosis Same as Pre-procedure Electronic Signature(s) Signed: 02/25/2019 7:07:15 PM By: [name] Entered By: [name] on 02/25/2019 18:10:45 -------------------------------------------------------------------------------- Lower Extremity Assessment Details Patient Name: Date of Service: Cleland, Gehris 02/25/2019 3:00 PM Medical Record [id number] Patient Account Number: [id number] Date of Birth/Sex: Treating RN: Jul 17, 1950 (68 y.o. [name] Primary Care Athalia Setterlund: PATIENT, NO Other Clinician: Referring Jahid Weida: Treating  Len Kluver/Extender:Robson, [name], Designated Weeks in Treatment: 13 Edema Assessment Assessed: [Left: No] [Right: No] Edema: [Left: Yes] [Right: No] Calf Left: Right: Point of Measurement: 35 cm From Medial Instep 36.5 cm cm Ankle Left: Right: Point of Measurement: 9 cm From Medial Instep 25 cm cm Vascular Assessment Pulses: Dorsalis Pedis Palpable: [Left:Yes] Electronic Signature(s) Signed: 03/11/2019 4:32:31 PM By: [name] Entered By: [name] on 02/25/2019 16:48:30 -------------------------------------------------------------------------------- Multi Wound Chart Details Patient Name: Date of Service: [name]. 02/25/2019 3:00 PM Medical Record [id number] Patient Account Number: [id number] Date of Birth/Sex: Treating RN: 06-May-1951 (68 y.o. [name] Primary Care Zairah Arista: PATIENT, NO Other Clinician: Referring Lipa Knauff: Treating Dawt Reeb/Extender:Robson, [name],  Designated Weeks in Treatment: 13 Vital Signs Height(in): 73 Pulse(bpm): 76 Weight(lbs): 205 Blood Pressure(mmHg): 137/113 Body Mass Index(BMI): 27 Temperature(F): 98.3 Respiratory 19 Rate(breaths/min): Photos: [4:No Photos] [5:No Photos] [N/A:N/A] Wound Location: [4:Left Malleolus - Lateral] [5:Left Calf - Posterior] [N/A:N/A] Wounding Event: [4:Trauma] [5:Gradually Appeared] [N/A:N/A] Primary Etiology: [4:Venous Leg Ulcer] [5:Atypical] [N/A:N/A] Comorbid History: [4:Arrhythmia, Coronary Artery Arrhythmia, Coronary Artery N/A Disease, Type II Diabetes Disease, Type II Diabetes] Date Acquired: [4:11/02/2018] [5:10/02/2018] [N/A:N/A] Weeks of Treatment: [4:13] [5:13] [N/A:N/A] Wound Status: [4:Open] [5:Open] [N/A:N/A] Measurements L x W x D 0.7x1.8x0.4 [5:2.7x1.8x0.1] [N/A:N/A] (cm) Area (cm) : [4:0.99] [5:3.817] [N/A:N/A] Volume (cm) : [4:0.396] [5:0.382] [N/A:N/A] % Reduction in Area: [4:23.60%] [5:-350.10%] [N/A:N/A] % Reduction in Volume:  [4:-204.60%] [5:-349.40%] [N/A:N/A] Classification: [4:Full Thickness Without Exposed Support Structures Exposed Support Structures] [5:Full Thickness Without] [N/A:N/A] Exudate Amount: [4:Small] [5:Medium] [N/A:N/A] Exudate Type: [4:Serosanguineous] [5:Serosanguineous] [N/A:N/A] Exudate Color: [4:red, brown] [5:red, brown] [N/A:N/A] Wound Margin: [4:Well defined, not attached Flat and Intact] [N/A:N/A] Granulation Amount: [4:Medium (34-66%)] [5:Large (67-100%)] [N/A:N/A] Granulation Quality: [4:Pink] [5:Red, Hyper-granulation] [N/A:N/A] Necrotic Amount: [4:Medium (34-66%)] [5:None Present (0%)] [N/A:N/A] Exposed Structures: [4:Fat Layer (Subcutaneous Fat Layer (Subcutaneous N/A Tissue) Exposed: Yes Fascia: No Tendon: No Muscle: No Joint: No Bone: No] [5:Tissue) Exposed: Yes Fascia: No Tendon: No Muscle: No Joint: No Bone: No] Epithelialization: [4:Small (1-33%)] [5:None] [N/A:N/A] Debridement: [4:Debridement - Excisional N/A] [N/A:N/A] Pre-procedure [4:17:35] [5:N/A] [N/A:N/A] Verification/Time Out Taken: Pain Control: [4:Lidocaine 4% Topical Solution] [5:N/A] [N/A:N/A] Tissue Debrided: [4:Subcutaneous, Slough] [5:N/A] [N/A:N/A] Level: [4:Skin/Subcutaneous Tissue] [5:N/A] [N/A:N/A] Debridement Area (sq cm):0.56 [5:N/A] [N/A:N/A] Instrument: [4:Curette] [5:N/A] [N/A:N/A] Bleeding: [4:Minimum] [5:N/A] [N/A:N/A] Hemostasis Achieved: [4:Pressure] [5:N/A] [N/A:N/A] Procedural Pain: [4:1] [5:N/A] [N/A:N/A] Post Procedural Pain: [4:3] [5:N/A] [N/A:N/A] Debridement Treatment Procedure was tolerated [5:N/A] [N/A:N/A] Response: [4:well] Post Debridement [4:0.7x0.8x0.4] [5:N/A] [N/A:N/A] Measurements L x W x D (cm) Post Debridement [4:0.176] [5:N/A] [N/A:N/A] Volume: (cm) Procedures Performed: Compression Therapy [4:Debridement] [5:N/A] [N/A:N/A] Treatment Notes Electronic Signature(s) Signed: 02/26/2019 8:02:37 AM By: Linton Ham MD Signed: 03/01/2019 5:51:45 PM By: [name] Entered By: Linton Ham on 02/26/2019 07:36:04 -------------------------------------------------------------------------------- Pain Assessment Details Patient Name: Date of Service: Zain, Lawry 02/25/2019 3:00 PM Medical Record [id number] Patient Account Number: [id number] Date of Birth/Sex: Treating RN: [birth year] (68 y.o. [name] Primary Care Leilene Diprima: PATIENT, NO Other Clinician: Referring Briggett Tuccillo: Treating Carmita Boom/Extender:Robson, [name], Designated Weeks in Treatment: 13 Active Problems Location of Pain Severity and Description of Pain Patient Has Paino No Site Locations Pain Management and Medication Current Pain Management: Electronic Signature(s) Signed: 02/25/2019 7:07:15 PM By: [name] Signed: 03/11/2019 4:32:31 PM By: [name] Entered By: [name] on 02/25/2019 16:47:26 -------------------------------------------------------------------------------- Wound Assessment Details Patient Name: Date of Service: Debbie, Klunk 02/25/2019 3:00 PM Medical Record [id number] Patient Account Number: [id number] Date of Birth/Sex: Treating RN: 1950/09/21 (68 y.o. [name] Primary Care Divon Krabill: PATIENT, NO Other Clinician: Referring Samika Vetsch: Treating Dexton Zwilling/Extender:Robson, [name], Designated Weeks in Treatment: 13 Wound Status Wound Number: 4 Primary Venous Leg Ulcer Etiology: Wound Location: Left Malleolus - Lateral Wound Open Wounding Event: Trauma Status: Date Acquired: 11/02/2018 Comorbid Arrhythmia, Coronary Artery Disease, Weeks Of Treatment: 13 History: Type II Diabetes Clustered Wound: No Photos Wound Measurements Length: (cm) 0.7 % Reduct Width: (cm) 1.8 % Reduct Depth: (cm) 0.4 Epitheli Area: (cm) 0.99 Tunneli Volume: (cm) 0.396 Undermi Wound Description Classification: Full Thickness Without Exposed Support Foul Odo Structures Slough/F Wound Well defined,  not attached Margin: Exudate Small Amount: Exudate Serosanguineous Type: Exudate red, brown Color: Wound Bed Granulation Amount: Medium (34-66%)  Granulation Quality: Pink Fascia E Necrotic Amount: Medium (34-66%) Fat Laye Necrotic Quality: Adherent Slough Tendon E Muscle E Joint Ex Bone Exp r After Cleansing: No ibrino Yes Exposed Structure xposed: No r (Subcutaneous Tissue) Exposed: Yes xposed: No xposed: No posed: No osed: No ion in Area: 23.6% ion in Volume: -204.6% alization: Small (1-33%) ng: No ning: No Electronic Signature(s) Signed: 03/03/2019 8:44:12 AM By: Mikeal Hawthorne EMT/HBOT Signed: 03/11/2019 4:32:31 PM By: [name] Entered By: Mikeal Hawthorne on 03/01/2019 08:48:32 -------------------------------------------------------------------------------- Wound Assessment Details Patient Name: Date of Service: [name]. 02/25/2019 3:00 PM Medical Record [id number] Patient Account Number: [id number] Date of Birth/Sex: Treating RN: 1950/07/29 (68 y.o. [name] Primary Care Denelda Akerley: PATIENT, NO Other Clinician: Referring Kween Bacorn: Treating Scottlyn Mchaney/Extender:Robson, [name], Designated Weeks in Treatment: 13 Wound Status Wound Number: 5 Primary Atypical Etiology: Wound Location: Left Calf - Posterior Wound Open Wounding Event: Gradually Appeared Status: Date Acquired: 10/02/2018 Comorbid Arrhythmia, Coronary Artery Disease, Weeks Of Treatment: 13 History: Type II Diabetes Clustered Wound: No Photos Wound Measurements Length: (cm) 2.7 % Reduct Width: (cm) 1.8 % Reduct Depth: (cm) 0.1 Epitheli Area: (cm) 3.817 Tunneli Volume: (cm) 0.382 Undermi Wound Description Classification: Full Thickness Without Exposed Support Foul Odo Structures Slough/F Wound Flat and Intact Margin: Exudate Medium Amount: Exudate Serosanguineous Type: Exudate red, brown Color: Wound Bed Granulation Amount: Large  (67-100%) Granulation Quality: Red, Hyper-granulation Fascia E Necrotic Amount: None Present (0%) Fat Laye Tendon E Muscle E Joint Ex Bone Exp r After Cleansing: No ibrino No Exposed Structure xposed: No r (Subcutaneous Tissue) Exposed: Yes xposed: No xposed: No posed: No osed: No ion in Area: -350.1% ion in Volume: -349.4% alization: None ng: No ning: No Electronic Signature(s) Signed: 03/03/2019 8:44:12 AM By: Mikeal Hawthorne EMT/HBOT Signed: 03/11/2019 4:32:31 PM By: [name] Entered By: Mikeal Hawthorne on 03/01/2019 08:48:08 -------------------------------------------------------------------------------- Vitals Details Patient Name: Date of Service: [name]. 02/25/2019 3:00 PM Medical Record [id number] Patient Account Number: [id number] Date of Birth/Sex: Treating RN: September 22, 1950 (68 y.o. [name] Primary Care Hodan Wurtz: PATIENT, NO Other Clinician: Referring Gustavus Haskin: Treating Alvetta Hidrogo/Extender:Robson, [name], Designated Weeks in Treatment: 13 Vital Signs Time Taken: 16:35 Temperature (F): 98.3 Height (in): 73 Pulse (bpm): 76 Weight (lbs): 205 Respiratory Rate (breaths/min): 19 Body Mass Index (BMI): 27 Blood Pressure (mmHg): 137/113 Reference Range: 80 - 120 mg / dl Electronic Signature(s) Signed: 03/11/2019 4:32:31 PM By: [name] Entered By: [name] on 02/25/2019 16:47:21

## 2019-03-14 NOTE — Progress Notes (Signed)
[name], [name] ([id number]) Visit Report for 03/11/2019 HPI Details Patient Name: Date of Service: [name], [name] 03/11/2019 3:45 PM Medical Record [id number] Patient Account Number: [id number] Date of Birth/Sex: Treating RN: [birth year] (68 y.o. [name] Primary Care Provider: PATIENT, NO Other Clinician: Referring Provider: Treating Provider/Extender:, [name], Designated Weeks in Treatment: 15 History of Present Illness HPI Description: 08/22/17-He is here for initial evaluation of a right heel wound, right toe wound and left heel fissures. He states that he has chronic dry, flaking skin and frequently has cracked heels. He states the right heel wound developed as such but he developed the wound approximately 3 weeks ago when pulling some skin off; he was unaware of the right toe ulcer. He was treating the right heel with over-the-counter triple antibiotic ointment and peroxide. He presented to urgent care on 3/12 with a 2 x 2 centimeter ulceration to the right heel and was discharged with a 10 day prescription for Bactrim. He does have a history of neuropathy, diet controlled diabetic. His does not know his most recent A1c but will be seeing his PCP next week, there is no evidence of an A1c in Epic. He denies smoking, does have a history of alcohol use, he admits to 5+/- beers weekly not daily. He did follow-up with cardiology on 3/18 with c/o ble edema, they ordered DVT study which he completed on 3/21. He reports that the bilateral DVT study was negative per the ultrasound technician, no report is available at this time. READMISSION 11/20/2018 this is a now 68 year old man who is here for review of wounds on his lower extremity on the left. We note that he was here on a single visit in March he tells [place] that over the last 2019. His major issue is that he dropped a box and hit the outside of his left lateral malleolus about 3 weeks ago. This is left him  with a nonhealing area. He has been using Iodosorb ointment he had apparently from the last stay in this clinic. Also of note over the last 6 weeks he has noted an area that is raised and will scab over but then he traumatizes a scab and then this bleeds and reopens. This is on the left posterior calf. All of this appears to be on the background of chronic venous insufficiency. He has had DVT studies in the past that were negative in 2019. He is also had arterial studies that showed an ABI on the right of 0.76 and on the left of 1.05. Past medical history; the patient states that he is not a diabetic although I note there is some suggested he was in the note from last year. He has a history of coronary artery disease. He tells me that he has had a history of skin cancer but he is not sure which one but that includes one on the left upper thigh. ABIs done previously 0.76 on the right and 1.05 on the left 6/26; patient readmitted to the clinic last week. He had a traumatic area over the left lateral malleolus as well as a hyper granulated growth on the left posterior calf. He also has severe chronic venous insufficiency. 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 We have been using Iodoflex to the traumatic wound under compression. 7/9; area over the left lateral malleolus continues to require debridement and we are continuing with Iodoflex under compression. He has severe chronic venous  insufficiency. 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 This patient has severe bilateral venous insufficiency and venous hypertension with skin changes resulting. He requires bilateral reflux studies Finally he traumatized his right lateral calf while doing yard work about a week ago. The area is small with a flap of skin over the  majority of the wound but I am not sure this is going to remain viable 7/16; still not a viable surface on the left lateral malleolus in fact this wound is deeper. We have been using Iodoflex. 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 He has severe bilateral venous insufficiency and I have ordered reflux studies these apparently are booked for next week. Our intake nurse noted some yellowish-green drainage coming from the wound on the left ankle. Patient was insistent on talking about doing carotid ultrasounds and he was hoping to get these done with his reflux studies next week. I spent a few minutes talking to him about this I just could not put together in a logical way what he is concerned about. At the end of this I asked him to see his primary doctor about this he says he does not have a primary doctor advised him to get a primary doctor to go over this with him. It was not really clear where the level of concern was specific to carotid ultrasound 7/23; a better looking surface on the left lateral malleolus although it still requires debridement. He tells me he has an appointment to Mohs surgery center next week. Small area on the right lateral calf. The patient went to [facility] I do not think they took his compression wraps off. He did have reflux on the left in the common femoral greater saphenous and saphenofemoral junction and the great saphenous vein in the proximal thigh. 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 Looking at the numbers it would appear that the maximal reflux was in the great saphenous vein in the proximal thigh. As usual I am not sure what would be possible to do here. I will send him to  vascular surgery 7/30; the patient still requires debridement in the left lateral malleolus although in general the wound is still deep and and punched out. We are using Sorbact. The area on the right lateral calf is improved. 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 The patient does not have a vascular surgery appointment nor does he have a skin surgery center appointment. I am really not sure what the issues are here although I know vascular surgery as well behind. 8/6-Patient does not have a vascular appointment nor does he have a skin surgery appointment firmed up at this time although he says he is called them both, impressed upon him that he should see whoever is available for the vascular so the vein studies can be interpreted. Stressed again the importance of having the skin cancer removed, patient expressed concerns about wound healing from that wound which I reassured would be reasonably good chances to heal, At any rate excision of the squamous cancer is  a priority. 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 He is here for the left lateral malleoli wound for which we are using SOrbact, and he is disappointed the wound is not healing 8/21; patient saw Dr. Doren Custard on 01/21/2019. Dr. Doren Custard was concerned about his arterial flow. Noting that he had trouble feeling the dorsalis pedis and posterior tibial arteries also noteworthy that the Doppler had a barely biphasic posterior tibial signal with a monophasic but fairly brisk dorsalis pedis signal. He is booked for an angiogram next Friday. We have been using 3 layer compression on him. 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 Given Dr. Mee Hives concern it was difficult for me to  be comfortable with him going ahead with the cancer extraction and I have suggested canceling this. Finally he has an increase in the erythematous contact dermatitis in the entire wrapped area of the left leg which is probably an allergy to the contact layer of the 3 layer. 8/27; he is going for his angiogram tomorrow by Dr. Doren Custard. We cancel the Mohs surgery on the tumor on his posterior calf pending the angiogram tomorrow. If this shows adequate blood flow to this area I think we can rebook this. His main wound is on the left lateral malleolus a small refractory punched-out area. We have been using Hydrofera Blue 9/3; his angiogram was done as scheduled. He does indeed have PAD. The common femoral, deep femoral superficial femoral and popliteal arteries will are all widely patent. 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 The overall thought was that he had enough adequate circulation to heal his venous ulcer and continue to use mild elevation and compression. Noted that he had biphasic posterior tibial signals with a Doppler ABI of 0.91. It was not felt that the stenosis in the proximal tibial artery would be associated with increased blood flow he will follow-up with Dr. Doren Custard in 1 month He now needs to get the skin surgery appointment set up. The area is growing larger and bleeding frequently on the posterior left calf per the patient 02/11/19-Patient is back after 1 week, results of his vascular studies are noted, patient needs to have a skin surgery appointment established apparently he did not return the calls this week. The area on the posterior calf is where the skin surgery needs to be done, the left lateral malleoli are wound appears to be about the same we will using Hydrofera Blue change 9/24;  patient finally has a booking to deal with the cancer on his posterior calf I believe with the skin surgery center. Not much change in the wound over the left lateral malleolus. It is been 2 weeks since he was here. I changed him to endoform last time 10/2; the patient has been to see dermatology. Scheduled for surgery on 10/14. Using endoform to the wound over the lateral malleolus some improvement in the wound condition but not the surface area. He has severe chronic venous insufficiency. He also has some arterial disease he was angiogram by  Dr. Doren Custard. He did not think that anything needed to be done from an intervention point of view. 10/8; his surgery is still scheduled for 10/14. It is concerning about whether he is healed or maintained skin integrity and is sutured area given the severity of his chronic venous insufficiency. The area we are following is on the left lateral malleolus. Some improvement in the surface area. No debridement today we have been using endoform Electronic Signature(s) Signed: 03/11/2019 5:49:31 PM By: Linton Ham MD Entered By: Linton Ham on 03/11/2019 17:35:30 -------------------------------------------------------------------------------- Physical Exam Details Patient Name: Date of Service: [name], [name] 03/11/2019 3:45 PM Medical Record [id number] Patient Account Number: [id number] Date of Birth/Sex: Treating RN: Apr 22, 1951 (68 y.o. [name] Primary Care Provider: PATIENT, NO Other Clinician: Referring Provider: Treating Provider/Extender:, [name], Designated Weeks in Treatment: 15 Constitutional Respirations regular, non-labored and within target range.. Temperature is normal and within the target range for the patient.. Eyes Conjunctivae clear. No discharge.no icterus. Cardiovascular Pedal pulses palpable. Severe chronic venous insufficiency.. Lymphatic None palpable in the left popliteal and inguinal  area. Integumentary (Hair, Skin) Severe hemosiderin deposition in the left and right leg. Psychiatric appears at normal baseline. Notes Wound exam The area on the left posterior calf hyper granulated mass with surrounding induration. He is going for surgery on this next week Left lateral medial malleolus no debridement I am going to continue with endoform however. There is slough at the bottom of this wound however the overall surface area seems a little better to me certainly no improvement in the depth however. No infection Electronic Signature(s) Signed: 03/11/2019 5:49:31 PM By: Linton Ham MD Entered By: Linton Ham on 03/11/2019 17:37:10 -------------------------------------------------------------------------------- Physician Orders Details Patient Name: Date of Service: [name], [name] 03/11/2019 3:45 PM Medical Record [id number] Patient Account Number: [id number] Date of Birth/Sex: Treating RN: [birth year] (68 y.o. [name] Primary Care Provider: PATIENT, NO Other Clinician: Referring Provider: Treating Provider/Extender:, [name], Designated Weeks in Treatment: 15 Verbal / Phone Orders: No Diagnosis Coding ICD-10 Coding Code Description I87.332 Chronic venous hypertension (idiopathic) with ulcer and inflammation of left lower extremity L97.321 Non-pressure chronic ulcer of left ankle limited to breakdown of skin C44.799 Other specified malignant neoplasm of skin of left lower limb, including hip Follow-up Appointments Return Appointment in 1 week. - Thursday Dressing Change Frequency Do not change entire dressing for one week. Skin Barriers/Peri-Wound Care TCA Cream or Ointment - liberal to lower leg Wound Cleansing May shower with protection. Primary Wound Dressing Wound #4 Left,Lateral Malleolus Endoform - moisten with saline, use small piece of gauze packing behind endoform Wound #5 Left,Posterior Calf Foam Secondary  Dressing Wound #4 Left,Lateral Malleolus Dry Gauze Other: - pad the left lateral and anterior portion of lower leg. Edema Control 3 Layer Compression System - Left Lower Extremity - no cotton use kerlix. Avoid standing for long periods of time Elevate legs to the level of the heart or above for 30 minutes daily and/or when sitting, a frequency of: - throughout the day Exercise regularly Additional Orders / Instructions Wound #5 Left,Posterior Calf Other: - Patient to keep scheduled appointment to have skin cancer removed from left posterior lower leg. Electronic Signature(s) Signed: 03/11/2019 5:49:31 PM By: Linton Ham MD Signed: 03/11/2019 6:06:48 PM By: [name] Entered By: [name] on 03/11/2019 16:58:24 -------------------------------------------------------------------------------- Problem List Details Patient Name: Date of Service: [name], [name] 03/11/2019 3:45 PM Medical Record [id number] Patient Account Number: [id number] Date of Birth/Sex: Treating RN: 1950/10/20 (68 y.o.  [name] Primary Care Provider: PATIENT, NO Other Clinician: Referring Provider: Treating Provider/Extender:, [name], Designated Weeks in Treatment: 15 Active Problems ICD-10 Evaluated Encounter Code Description Active Date Today Diagnosis I87.332 Chronic venous hypertension (idiopathic) with ulcer 11/20/2018 No Yes and inflammation of left lower extremity L97.321 Non-pressure chronic ulcer of left ankle limited to 11/20/2018 No Yes breakdown of skin C44.799 Other specified malignant neoplasm of skin of left 11/27/2018 No Yes lower limb, including hip Inactive Problems ICD-10 Code Description Active Date Inactive Date L97.211 Non-pressure chronic ulcer of right calf limited to breakdown of 12/10/2018 12/10/2018 skin Resolved Problems Electronic Signature(s) Signed: 03/11/2019 5:49:31 PM By: Linton Ham MD Entered By: Linton Ham on 03/11/2019  17:34:29 -------------------------------------------------------------------------------- Progress Note Details Patient Name: Date of Service: [name] 03/11/2019 3:45 PM Medical Record [id number] Patient Account Number: [id number] Date of Birth/Sex: Treating RN: May 06, 1951 (68 y.o. [name] Primary Care Provider: PATIENT, NO Other Clinician: Referring Provider: Treating Provider/Extender:, [name], Designated Weeks in Treatment: 15 Subjective History of Present Illness (HPI) 08/22/17-He is here for initial evaluation of a right heel wound, right toe wound and left heel fissures. He states that he has chronic dry, flaking skin and frequently has cracked heels. He states the right heel wound developed as such but he developed the wound approximately 3 weeks ago when pulling some skin off; he was unaware of the right toe ulcer. He was treating the right heel with over-the-counter triple antibiotic ointment and peroxide. He presented to urgent care on 3/12 with a 2 x 2 centimeter ulceration to the right heel and was discharged with a 10 day prescription for Bactrim. He does have a history of neuropathy, diet controlled diabetic. His does not know his most recent A1c but will be seeing his PCP next week, there is no evidence of an A1c in Epic. He denies smoking, does have a history of alcohol use, he admits to 5+/- beers weekly not daily. He did follow-up with cardiology on 3/18 with c/o ble edema, they ordered DVT study which he completed on 3/21. He reports that the bilateral DVT study was negative per the ultrasound technician, no report is available at this time. READMISSION 11/20/2018 this is a now 68 year old man who is here for review of wounds on his lower extremity on the left. We note that he was here on a single visit in March he tells [place] that over the last 2019. His major issue is that he dropped a box and hit the outside of his left lateral  malleolus about 3 weeks ago. This is left him with a nonhealing area. He has been using Iodosorb ointment he had apparently from the last stay in this clinic. Also of note over the last 6 weeks he has noted an area that is raised and will scab over but then he traumatizes a scab and then this bleeds and reopens. This is on the left posterior calf. All of this appears to be on the background of chronic venous insufficiency. He has had DVT studies in the past that were negative in 2019. He is also had arterial studies that showed an ABI on the right of 0.76 and on the left of 1.05. Past medical history; the patient states that he is not a diabetic although I note there is some suggested he was in the note from last year. He has a history of coronary artery disease. He tells me that he has had a history of skin cancer but he  is not sure which one but that includes one on the left upper thigh. ABIs done previously 0.76 on the right and 1.05 on the left 6/26; patient readmitted to the clinic last week. He had a traumatic area over the left lateral malleolus as well as a hyper granulated growth on the left posterior calf. He also has severe chronic venous insufficiency. 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 We have been using Iodoflex to the traumatic wound under compression. 7/9; area over the left lateral malleolus continues to require debridement and we are continuing with Iodoflex under compression. He has severe chronic venous insufficiency. 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 This patient has severe bilateral venous insufficiency and venous hypertension with skin changes resulting. He requires bilateral reflux studies Finally he traumatized his right lateral calf while doing yard work about a week ago. The area  is small with a flap of skin over the majority of the wound but I am not sure this is going to remain viable 7/16; still not a viable surface on the left lateral malleolus in fact this wound is deeper. We have been using Iodoflex. 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 He has severe bilateral venous insufficiency and I have ordered reflux studies these apparently are booked for next week. Our intake nurse noted some yellowish-green drainage coming from the wound on the left ankle. Patient was insistent on talking about doing carotid ultrasounds and he was hoping to get these done with his reflux studies next week. I spent a few minutes talking to him about this I just could not put together in a logical way what he is concerned about. At the end of this I asked him to see his primary doctor about this he says he does not have a primary doctor advised him to get a primary doctor to go over this with him. It was not really clear where the level of concern was specific to carotid ultrasound 7/23; a better looking surface on the left lateral malleolus although it still requires debridement. He tells me he has an appointment to [facility]s surgery center next week. Small area on the right lateral calf. The patient [facility] I do not think they took his compression wraps off. He did have reflux on the left in the common femoral greater saphenous and saphenofemoral junction and the great saphenous vein in the proximal thigh. 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 Looking at the numbers it would appear that the maximal reflux was in the great saphenous vein in the proximal thigh. As usual I am not sure what would be  possible to do here. I will send him to vascular surgery 7/30; the patient still requires debridement in the left lateral malleolus although in general the wound is still deep and and punched out. We are using Sorbact. The area on the right lateral calf is improved. 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 The patient does not have a  vascular surgery appointment nor does he have a skin surgery center appointment. I am really not sure what the issues are here although I know vascular surgery as well behind. 8/6-Patient does not have a vascular appointment nor does he have a skin surgery appointment firmed up at this time although he says he is called them both, impressed upon him that he should see whoever is available for the vascular so the vein studies can be interpreted. 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 He is here for the left lateral malleoli wound for which we are using SOrbact, and he is disappointed the wound is not healing 8/21; patient saw Dr. Doren Custard on 01/21/2019. Dr. Doren Custard was concerned about his arterial flow. Noting that he had trouble feeling the dorsalis pedis and posterior tibial arteries also noteworthy that the Doppler had a barely biphasic posterior tibial signal with a monophasic but fairly brisk dorsalis pedis signal. He is booked for an angiogram next Friday. We have been using 3 layer compression on him. 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 Given Dr.  Mee Hives concern it was difficult for me to be comfortable with him going ahead with the cancer extraction and I have suggested canceling this. Finally he has an increase in the erythematous contact dermatitis in the entire wrapped area of the left leg which is probably an allergy to the contact layer of the 3 layer. 8/27; he is going for his angiogram tomorrow by Dr. Doren Custard. We cancel the Mohs surgery on the tumor on his posterior calf pending the angiogram tomorrow. If this shows adequate blood flow to this area I think we can rebook this. His main wound is on the left lateral malleolus a small refractory punched-out area. We have been using Hydrofera Blue 9/3; his angiogram was done as scheduled. He does indeed have PAD. The common femoral, deep femoral superficial femoral and popliteal arteries will are all widely patent. 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 The overall thought was that he had enough adequate circulation to heal his venous ulcer and continue to use mild elevation and compression. Noted that he had biphasic posterior tibial signals with a Doppler ABI of 0.91. It was not felt that the stenosis in the proximal tibial artery would be associated with increased blood flow he will follow-up with Dr. Doren Custard in 1 month He now needs to get the skin surgery appointment set up. The area is growing larger and bleeding frequently on the posterior left calf per the patient 02/11/19-Patient is back after 1 week, results of his vascular studies are noted, patient needs to have a skin surgery appointment established apparently he did not return the calls this week. 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 Not much change in the wound over the left lateral malleolus. It is been 2 weeks since he was here. I changed him to endoform last time 10/2; the patient has been to see dermatology. Scheduled for surgery on 10/14. Using endoform to the wound over the lateral malleolus some improvement in the wound condition but not the surface area. He has severe chronic venous insufficiency. He also has some arterial disease he was angiogram by Dr. Doren Custard. He did not think that anything needed to be done from an intervention point of view. 10/8; his surgery is still scheduled for 10/14. It is concerning about whether he is healed or maintained skin integrity and is sutured area given the severity of his chronic venous insufficiency. The area we are following is on the left lateral malleolus. Some improvement in the surface area. No debridement today we have been using endoform Objective Constitutional Respirations regular, non-labored and within target range.. Temperature is normal and within the target range for the patient.. Vitals Time Taken: 4:05 PM, Height: 73 in, Weight: 205 lbs, BMI: 27, Temperature: 98.5 F, Respiratory Rate: 17 breaths/min. Eyes Conjunctivae clear. No discharge.no icterus. Cardiovascular Pedal pulses palpable. Severe chronic venous insufficiency.. Lymphatic None palpable in the left popliteal and inguinal area. Psychiatric appears at normal baseline. General Notes: Wound exam ooThe area on the left posterior calf hyper granulated mass with surrounding induration. He is going for surgery on this next week ooLeft lateral medial malleolus no debridement I am going to continue with endoform however. There is slough at the bottom of this wound however the overall surface area seems a little better to me certainly no  improvement in the depth however. No infection Integumentary (Hair, Skin) Severe hemosiderin deposition in the left and right leg. Wound #4 status is Open. Original cause of wound was Trauma. The wound is located on the Left,Lateral Malleolus. The wound measures 1cm length x 1cm width x 0.8cm depth; 0.785cm^2 area and 0.628cm^3 volume. There is Fat Layer (Subcutaneous Tissue) Exposed exposed. There is no tunneling or undermining noted. There is a small amount of serosanguineous drainage noted. The wound margin is well defined and not attached to the wound base. There is small (1-33%) pink, pale granulation within the wound bed. There is a large (67-100%) amount of necrotic tissue within the wound bed including Adherent Slough. Wound #5 status is Open. Original cause of wound was Gradually Appeared. The wound is located on the Left,Posterior Calf. The wound measures 2.5cm length x 2cm width x 0.2cm depth; 3.927cm^2 area and 0.785cm^3 volume. There is Fat Layer (Subcutaneous Tissue) Exposed exposed. There is no tunneling or undermining noted. There is a medium amount of serosanguineous drainage noted. The wound margin is indistinct and nonvisible. There is large (67-100%) red, hyper - granulation within the wound bed. There is no necrotic tissue within the wound bed. 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 There was a Three Layer Compression Therapy Procedure with a pre-treatment ABI of  1 by [name], RN. Post procedure Diagnosis Wound #4: Same as Pre-Procedure Wound #5 Pre-procedure diagnosis of Wound #5 is an Atypical located on the  Left,Posterior Calf . There was a Three Layer Compression Therapy Procedure with a pre-treatment ABI of 1 by [name], RN. Post procedure Diagnosis Wound #5: Same as Pre-Procedure Plan Follow-up Appointments: Return Appointment in 1 week. - Thursday Dressing Change Frequency: Do not change entire dressing for one week. Skin Barriers/Peri-Wound Care: TCA Cream or Ointment - liberal to lower leg Wound Cleansing: May shower with protection. Primary Wound Dressing: Wound #4 Left,Lateral Malleolus: Endoform - moisten with saline, use small piece of gauze packing behind endoform Wound #5 Left,Posterior Calf: Foam Secondary Dressing: Wound #4 Left,Lateral Malleolus: Dry Gauze Other: - pad the left lateral and anterior portion of lower leg. Edema Control: 3 Layer Compression System - Left Lower Extremity - no cotton use kerlix. Avoid standing for long periods of time Elevate legs to the level of the heart or above for 30 minutes daily and/or when sitting, a frequency of: - throughout the day Exercise regularly Additional Orders / Instructions: Wound #5 Left,Posterior Calf: Other: - Patient to keep scheduled appointment to have skin cancer removed from left posterior lower leg. 1. I am continue with endoform in the left lateral malleolus 2. He had a small contusion on the left upper leg but there was no open area. This was either where he has been cutting wraps or perhaps dog injury 3. The tumor on the back of his leg is expanding and getting larger at a fairly alarming rate. I emphasized to him the need to have this dealt with next week. I has further said that we are all concerned that the suture will not hold together however there is no choice here but the procedure Electronic Signature(s) Signed: 03/11/2019 5:49:31 PM By: Linton Ham MD Entered By: Linton Ham on 03/11/2019  17:38:59 -------------------------------------------------------------------------------- SuperBill Details Patient Name: Date of Service: [name], [name] 03/11/2019 Medical Record [id number] Patient Account Number: [id number] Date of Birth/Sex: Treating RN: November 13, 1950 (68 y.o. 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, including hip Facility Procedures CPT4 Code Description: [id number] (Facility Use Only) [phone] - APPLY MULTLAY COMPRS LWR LT LEG Modifier: Quantity: 1 Physician Procedures CPT4 Code Description: [id number] [place] - WC PHYS LEVEL 3 - EST PT ICD-10 Diagnosis Description I87.332 Chronic venous hypertension (idiopathic) with ulcer and infl extremity L97.321 Non-pressure chronic ulcer of left ankle limited to breakdow C44.799 Other  specified malignant neoplasm of skin of left lower lim Modifier: ammation of left n of skin b, including hip Quantity: 1 lower Electronic Signature(s) Signed: 03/11/2019 6:06:48 PM By: [name] Signed: 03/14/2019 10:27:00 AM By: Linton Ham MD Previous Signature: 03/11/2019 5:49:31 PM Version By: Linton Ham MD Entered By: [name] on 03/11/2019 17:51:15

## 2019-03-18 ENCOUNTER — Encounter (HOSPITAL_BASED_OUTPATIENT_CLINIC_OR_DEPARTMENT_OTHER): Payer: Medicare Other | Admitting: Internal Medicine

## 2019-03-18 ENCOUNTER — Other Ambulatory Visit: Payer: Self-pay

## 2019-03-18 DIAGNOSIS — I87332 Chronic venous hypertension (idiopathic) with ulcer and inflammation of left lower extremity: Secondary | ICD-10-CM | POA: Diagnosis not present

## 2019-03-19 NOTE — Progress Notes (Signed)
[name] ([id number]) Visit Report for 03/18/2019 Debridement Details Patient Name: [name], [name]. Date of Service: 03/18/2019 3:30 PM Medical Record [id number] Patient Account Number: [id number] Date of Birth/Sex: Treating RN: April 27, 1951 (68 y.o. [name] Primary Care Provider: PATIENT, NO Other Clinician: Referring Provider: Treating Provider/Extender:[name], [name], Designated Weeks in Treatment: 16 Debridement Performed for Wound #4 Left,Lateral Malleolus Assessment: Performed By: Physician [name]., MD Debridement Type: Debridement Severity of Tissue Pre Fat layer exposed Debridement: Level of Consciousness (Pre- Awake and Alert procedure): Pre-procedure Verification/Time Out Taken: Yes - 17:07 Start Time: 17:07 Pain Control: Lidocaine 4% Topical Solution Total Area Debrided (L x W): 0.6 (cm) x 0.8 (cm) = 0.48 (cm) Tissue and other material Viable, Non-Viable, Slough, Subcutaneous, Slough debrided: Level: Skin/Subcutaneous Tissue Debridement Description: Excisional Instrument: Curette Bleeding: Minimum Hemostasis Achieved: Pressure End Time: 17:08 Procedural Pain: 0 Post Procedural Pain: 0 Response to Treatment: Procedure was tolerated well Level of Consciousness Awake and Alert (Post-procedure): Post Debridement Measurements of Total Wound Length: (cm) 0.6 Width: (cm) 0.8 Depth: (cm) 0.5 Volume: (cm) 0.188 Character of Wound/Ulcer Post Improved Debridement: Severity of Tissue Post Debridement: Fat layer exposed Post Procedure Diagnosis Same as Pre-procedure Electronic Signature(s) Signed: 03/18/2019 5:35:18 PM By: Michael Ham MD Signed: 03/19/2019 6:00:55 PM By: Michael Hurst RN, BSN Entered By: [name] on 03/18/2019 17:30:26 -------------------------------------------------------------------------------- HPI Details Patient Name: Date of Service: [name] 03/18/2019 3:30  PM Medical Record [id number] Patient Account Number: [id number] Date of Birth/Sex: Treating RN: [birth year] (68 y.o. [name] Primary Care Provider: PATIENT, NO Other Clinician: Referring Provider: Treating Provider/Extender:[name], [name], Designated Weeks in Treatment: 16 History of Present Illness HPI Description: 08/22/17-He is here for initial evaluation of a right heel wound, right toe wound and left heel fissures. He states that he has chronic dry, flaking skin and frequently has cracked heels. He states the right heel wound developed as such but he developed the wound approximately 3 weeks ago when pulling some skin off; he was unaware of the right toe ulcer. He was treating the right heel with over-the-counter triple antibiotic ointment and peroxide. He presented to urgent care on 3/12 with a 2 x 2 centimeter ulceration to the right heel and was discharged with a 10 day prescription for Bactrim. He does have a history of neuropathy, diet controlled diabetic. His does not know his most recent A1c but will be seeing his PCP next week, there is no evidence of an A1c in Epic. He denies smoking, does have a history of alcohol use, he admits to 5+/- beers weekly not daily. He did follow-up with cardiology on 3/18 with c/o ble edema, they ordered DVT study which he completed on 3/21. He reports that the bilateral DVT study was negative per the ultrasound technician, no report is available at this time. READMISSION 11/20/2018 this is a now 68 year old man who is here for review of wounds on his lower extremity on the left. We note that he was here on a single visit in March he tells [place] that over the last 2019. His major issue is that he dropped a box and hit the outside of his left lateral malleolus about 3 weeks ago. This is left him with a nonhealing area. He has been using Iodosorb ointment he had apparently from the last stay in this clinic. Also of note over the last  6 weeks he has noted an area that is raised and will scab over but then he traumatizes a  scab and then this bleeds and reopens. This is on the left posterior calf. All of this appears to be on the background of chronic venous insufficiency. He has had DVT studies in the past that were negative in 2019. He is also had arterial studies that showed an ABI on the right of 0.76 and on the left of 1.05. Past medical history; the patient states that he is not a diabetic although I note there is some suggested he was in the note from last year. He has a history of coronary artery disease. He tells me that he has had a history of skin cancer but he is not sure which one but that includes one on the left upper thigh. ABIs done previously 0.76 on the right and 1.05 on the left 6/26; patient readmitted to the clinic last week. He had a traumatic area over the left lateral malleolus as well as a hyper granulated growth on the left posterior calf. He also has severe chronic venous insufficiency. 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 We have been using Iodoflex to the traumatic wound under compression. 7/9; area over the left lateral malleolus continues to require debridement and we are continuing with Iodoflex under compression. He has severe chronic venous insufficiency. 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 This patient has severe bilateral venous insufficiency and venous hypertension with skin changes resulting. He requires bilateral reflux studies Finally he traumatized his right lateral calf while doing yard work about a week ago. The area is small with a flap of skin over the majority of the wound but I am not sure this is going to remain viable 7/16; still not a viable surface on the left lateral malleolus in fact this  wound is deeper. We have been using Iodoflex. 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 He has severe bilateral venous insufficiency and I have ordered reflux studies these apparently are booked for next week. Our intake nurse noted some yellowish-green drainage coming from the wound on the left ankle. Patient was insistent on talking about doing carotid ultrasounds and he was hoping to get these done with his reflux studies next week. I spent a few minutes talking to him about this I just could not put together in a logical way what he is concerned about. At the end of this I asked him to see his primary doctor about this he says he does not have a primary doctor advised him to get a primary doctor to go over this with him. It was not really clear where the level of concern was specific to carotid ultrasound 7/23; a better looking surface on the left lateral malleolus although it still requires debridement. He tells me he has an appointment to [facility]s surgery center next week. Small area on the right lateral calf. The patient went to [facility] I do not think they took his compression wraps off. He did have reflux on the left in the common femoral greater saphenous and saphenofemoral junction and the great saphenous vein in the proximal thigh. 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 Looking at the numbers it would appear that the maximal reflux was in the great saphenous vein in the proximal thigh. As usual I am not sure what would be possible to do here. I will send him to vascular surgery 7/30; the patient still requires debridement in the left lateral malleolus although in general the wound is still deep and and  punched out. We are using Sorbact. The area on the right lateral calf is improved. 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 The patient does not have a vascular surgery appointment nor does he have a skin surgery center appointment. I am really not sure what the issues are here although I know vascular surgery as well behind. 8/6-Patient does not have a vascular appointment nor does he have a skin surgery appointment firmed up at this time although he says he is called them both, impressed upon him that he should see whoever is available for the vascular so the vein studies can be interpreted. 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 He is here for the left lateral malleoli wound for which we are using SOrbact, and he is disappointed the wound is not healing 8/21; patient saw Dr. Doren Custard on 01/21/2019. Dr. Doren Custard was concerned about his arterial flow. Noting that he had trouble feeling the dorsalis pedis and posterior tibial arteries also noteworthy that the Doppler had a barely biphasic posterior tibial signal with a monophasic but fairly brisk dorsalis pedis signal. He is booked for an angiogram next Friday. We have been using 3 layer compression on him. 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 Given Dr. Mee Hives concern it was difficult for me to be comfortable with him going ahead with the cancer extraction and I have suggested canceling this. Finally he has an increase in the  erythematous contact dermatitis in the entire wrapped area of the left leg which is probably an allergy to the contact layer of the 3 layer. 8/27; he is going for his angiogram tomorrow by Dr. Doren Custard. We cancel the Mohs surgery on the tumor on his posterior calf pending the angiogram tomorrow. If this shows adequate blood flow to this area I think we can rebook this. His main wound is on the left lateral malleolus a small refractory punched-out area. We have been using Hydrofera Blue 9/3; his angiogram was done as scheduled. He does indeed have PAD. The common femoral, deep femoral superficial femoral and popliteal arteries will are all widely patent. 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 The overall thought was that he had enough adequate circulation to heal his venous ulcer and continue to use mild elevation and compression. Noted that he had biphasic posterior  tibial signals with a Doppler ABI of 0.91. It was not felt that the stenosis in the proximal tibial artery would be associated with increased blood flow he will follow-up with Dr. Doren Custard in 1 month He now needs to get the skin surgery appointment set up. The area is growing larger and bleeding frequently on the posterior left calf per the patient 02/11/19-Patient is back after 1 week, results of his vascular studies are noted, patient needs to have a skin surgery appointment established apparently he did not return the calls this week. The area on the posterior calf is where the skin surgery needs to be done, the left lateral malleoli are wound appears to be about the same we will using Hydrofera Blue change 9/24; patient finally has a booking to deal with the cancer on his posterior calf I believe with the skin surgery center. Not much change in  the wound over the left lateral malleolus. It is been 2 weeks since he was here. I changed him to endoform last time 10/2; the patient has been to see dermatology. Scheduled for surgery on 10/14. Using endoform to the wound over the lateral malleolus some improvement in the wound condition but not the surface area. He has severe chronic venous insufficiency. He also has some arterial disease he was angiogram by Dr. Doren Custard. He did not think that anything needed to be done from an intervention point of view. 10/8; his surgery is still scheduled for 10/14. It is concerning about whether he is healed or maintained skin integrity and is sutured area given the severity of his chronic venous insufficiency. The area we are following is on the left lateral malleolus. Some improvement in the surface area. No debridement today we have been using endoform 10/15; the patient finally had his surgery for the tumor on his left posterior calf. We did not look at this today. Still a punched out area in the lower left malleolus. Perhaps some mild improvement we have been using endoform We were denied for [employer] and still have not heard back on [employer]) Signed: 03/18/2019 5:35:18 PM By: Michael Ham MD Entered By: [name] on 03/18/2019 17:31:28 -------------------------------------------------------------------------------- Physical Exam Details Patient Name: Date of Service: [name], [name] 03/18/2019 3:30 PM Medical Record [id number] Patient Account Number: [id number] Date of Birth/Sex: Treating RN: 04/02/1951 (68 y.o. [name] Primary Care Provider: PATIENT, NO Other Clinician: Referring Provider: Treating Provider/Extender:Zadie Deemer, [name], Designated Weeks in Treatment: 16 Constitutional Patient is hypertensive.. Pulse regular and within target range for patient.Marland Kitchen Respirations regular, non-labored and within target range.. Temperature is normal and  within the target range for the patient.Marland Kitchen Appears in no distress. 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 Notes Wound exam; The area on the left posterior calf is bandaged which was his original cancer. We did not disturb this. Debridement with a #3 curette of the area of the left lateral malleolus. I am able to get this to a very healthy looking surface albeit with considerable depth there is no palpable bone no evidence of infection. Electronic Signature(s) Signed: 03/18/2019 5:35:18 PM By: Michael Ham MD Entered By: [name] on 03/18/2019 17:32:34 -------------------------------------------------------------------------------- Physician Orders Details Patient Name: Date of Service: [name], [name] 03/18/2019 3:30 PM Medical Record [id number] Patient Account Number: [id number] Date of Birth/Sex: Treating RN: 07/05/1950 (68 y.o. [name] Primary Care Provider: PATIENT, NO Other Clinician: Referring Provider: Treating Provider/Extender:Kataryna Mcquilkin, [name], Designated Weeks in Treatment: [phone] Verbal / Phone  Orders: No Diagnosis Coding ICD-10 Coding Code Description I87.332 Chronic venous hypertension (idiopathic) with ulcer and inflammation of left lower extremity L97.321 Non-pressure chronic ulcer of left ankle limited to breakdown of skin C44.799 Other specified malignant neoplasm of skin of left lower limb, including hip Follow-up Appointments Return Appointment in 1 week. - Thursday Dressing Change Frequency Wound #4 Left,Lateral Malleolus Do not change entire dressing for one week. Skin Barriers/Peri-Wound Care TCA Cream or Ointment - liberal to lower leg Wound Cleansing Wound #4 Left,Lateral Malleolus May shower with protection. Primary Wound Dressing Wound #4 Left,Lateral Malleolus Endoform - moisten with saline, use small piece of gauze packing behind endoform Secondary Dressing Wound #4  Left,Lateral Malleolus Dry Gauze Other: - pad the left lateral and anterior portion of lower leg. Edema Control 3 Layer Compression System - Left Lower Extremity - no cotton use kerlix. Avoid standing for long periods of time Elevate legs to the level of the heart or above for 30 minutes daily and/or when sitting, a frequency of: - throughout the day Exercise regularly Electronic Signature(s) Signed: 03/18/2019 5:35:18 PM By: Michael Ham MD Signed: 03/19/2019 6:00:55 PM By: Michael Hurst RN, BSN Entered By: [name] on 03/18/2019 16:50:07 -------------------------------------------------------------------------------- Problem List Details Patient Name: Date of Service: [name], [name] 03/18/2019 3:30 PM Medical Record [id number] Patient Account Number: [id number] Date of Birth/Sex: Treating RN: Apr 17, 1951 (68 y.o. [name] Primary Care Provider: PATIENT, NO Other Clinician: Referring Provider: Treating Provider/Extender:Ryin Ambrosius, [name], Designated Weeks in Treatment: 16 Active Problems ICD-10 Evaluated Encounter Code Description Active Date Today Diagnosis I87.332 Chronic venous hypertension (idiopathic) with ulcer 11/20/2018 No Yes and inflammation of left lower extremity L97.321 Non-pressure chronic ulcer of left ankle limited to 11/20/2018 No Yes breakdown of skin C44.799 Other specified malignant neoplasm of skin of left 11/27/2018 No Yes lower limb, including hip Inactive Problems ICD-10 Code Description Active Date Inactive Date L97.211 Non-pressure chronic ulcer of right calf limited to breakdown of 12/10/2018 12/10/2018 skin Resolved Problems Electronic Signature(s) Signed: 03/18/2019 5:35:18 PM By: Michael Ham MD Entered By: [name] on 03/18/2019 17:30:07 -------------------------------------------------------------------------------- Progress Note Details Patient Name: Date of Service: [name] 03/18/2019  3:30 PM Medical Record [id number] Patient Account Number: [id number] Date of Birth/Sex: Treating RN: 02-Mar-1951 (68 y.o. [name] Primary Care Provider: PATIENT, NO Other Clinician: Referring Provider: Treating Provider/Extender:Ioma Chismar, [name], Designated Weeks in Treatment: 16 Subjective History of Present Illness (HPI) 08/22/17-He is here for initial evaluation of a right heel wound, right toe wound and left heel fissures. He states that he has chronic dry, flaking skin and frequently has cracked heels. He states the right heel wound developed as such but he developed the wound approximately 3 weeks ago when pulling some skin off; he was unaware of the right toe ulcer. He was treating the right heel with over-the-counter triple antibiotic ointment and peroxide. He presented to urgent care on 3/12 with a 2 x 2 centimeter ulceration to the right heel and was discharged with a 10 day prescription for Bactrim. He does have a history of neuropathy, diet controlled diabetic. His does not know his most recent A1c but will be seeing his PCP next week, there is no evidence of an A1c in Epic. He denies smoking, does have a history of alcohol use, he admits to 5+/- beers weekly not daily. He did follow-up with cardiology on 3/18 with c/o ble edema, they ordered DVT study which he completed on 3/21. He reports that the bilateral DVT study  was negative per the ultrasound technician, no report is available at this time. READMISSION 11/20/2018 this is a now 68 year old man who is here for review of wounds on his lower extremity on the left. We note that he was here on a single visit in March he tells [place] that over the last 2019. His major issue is that he dropped a box and hit the outside of his left lateral malleolus about 3 weeks ago. This is left him with a nonhealing area. He has been using Iodosorb ointment he had apparently from the last stay in this clinic. Also of note over the  last 6 weeks he has noted an area that is raised and will scab over but then he traumatizes a scab and then this bleeds and reopens. This is on the left posterior calf. All of this appears to be on the background of chronic venous insufficiency. He has had DVT studies in the past that were negative in 2019. He is also had arterial studies that showed an ABI on the right of 0.76 and on the left of 1.05. Past medical history; the patient states that he is not a diabetic although I note there is some suggested he was in the note from last year. He has a history of coronary artery disease. He tells me that he has had a history of skin cancer but he is not sure which one but that includes one on the left upper thigh. ABIs done previously 0.76 on the right and 1.05 on the left 6/26; patient readmitted to the clinic last week. He had a traumatic area over the left lateral malleolus as well as a hyper granulated growth on the left posterior calf. He also has severe chronic venous insufficiency. 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 We have been using Iodoflex to the traumatic wound under compression. 7/9; area over the left lateral malleolus continues to require debridement and we are continuing with Iodoflex under compression. He has severe chronic venous insufficiency. 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 This patient has severe bilateral venous insufficiency and venous hypertension with skin changes resulting. He requires bilateral reflux studies Finally he traumatized his right lateral calf while doing yard work about a week ago. The area is small with a flap of skin over the majority of the wound but I am not sure this is going to remain viable 7/16; still not a viable surface on the left lateral malleolus in fact  this wound is deeper. We have been using Iodoflex. 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 He has severe bilateral venous insufficiency and I have ordered reflux studies these apparently are booked for next week. Our intake nurse noted some yellowish-green drainage coming from the wound on the left ankle. Patient was insistent on talking about doing carotid ultrasounds and he was hoping to get these done with his reflux studies next week. I spent a few minutes talking to him about this I just could not put together in a logical way what he is concerned about. At the end of this I asked him to see his primary doctor about this he says he does  not have a primary doctor advised him to get a primary doctor to go over this with him. It was not really clear where the level of concern was specific to carotid ultrasound 7/23; a better looking surface on the left lateral malleolus although it still requires debridement. He tells me he has an appointment to [facility] surgery center next week. Small area on the right lateral calf. The patient went to [facility] I do not think they took his compression wraps off. He did have reflux on the left in the common femoral greater saphenous and saphenofemoral junction and the great saphenous vein in the proximal thigh. 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 Looking at the numbers it would appear that the maximal reflux was in the great saphenous vein in the proximal thigh. As usual I am not sure what would be possible to do here. I will send him to vascular surgery 7/30; the patient still requires debridement in the left lateral malleolus although in general the wound is still deep and and  punched out. We are using Sorbact. The area on the right lateral calf is improved. 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 The patient does not have a vascular surgery appointment nor does he have a skin surgery center appointment. I am really not sure what the issues are here although I know vascular surgery as well behind. 8/6-Patient does not have a vascular appointment nor does he have a skin surgery appointment firmed up at this time although he says he is called them both, impressed upon him that he should see whoever is available for the vascular so the vein studies can be interpreted. 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 He is here for the left lateral malleoli wound for which we are using SOrbact, and he is disappointed the wound is not healing 8/21; patient saw Dr. Doren Custard on 01/21/2019. Dr. Doren Custard was concerned about his arterial flow. Noting that he had trouble feeling the dorsalis pedis and posterior tibial arteries also noteworthy that the Doppler had a barely biphasic posterior tibial signal with a monophasic but fairly brisk dorsalis pedis signal. He is booked for an angiogram next Friday. We have been using 3 layer compression on him. 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 Given Dr. Mee Hives concern it was difficult for me to be comfortable with him going ahead with the cancer extraction and I have suggested canceling this. Finally he has an increase in the  erythematous contact dermatitis in the entire wrapped area of the left leg which is probably an allergy to the contact layer of the 3 layer. 8/27; he is going for his angiogram tomorrow by Dr. Doren Custard. We cancel the Mohs surgery on the tumor on his posterior calf pending the angiogram tomorrow. If this shows adequate blood  flow to this area I think we can rebook this. His main wound is on the left lateral malleolus a small refractory punched-out area. We have been using Hydrofera Blue 9/3; his angiogram was done as scheduled. He does indeed have PAD. The common femoral, deep femoral superficial femoral and popliteal arteries will are all widely patent. 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 The overall thought was that he had enough adequate circulation to heal his venous ulcer and continue to use mild elevation and compression. Noted that he had biphasic posterior tibial signals with a Doppler ABI of 0.91. It was not felt that the stenosis in the proximal tibial artery would be associated with increased blood flow he will follow-up with Dr. Doren Custard in 1 month He now needs to get the skin surgery appointment set up. The area is growing larger and bleeding frequently on the posterior left calf per the patient 02/11/19-Patient is back after 1 week, results of his vascular studies are noted, patient needs to have a skin surgery appointment established apparently he did not return the calls this week. The area on the posterior calf is where the skin surgery needs to be done, the left lateral malleoli are wound appears to be about the same we will using Hydrofera Blue change 9/24; patient finally has a booking to deal with the cancer on his posterior calf I believe with the skin surgery center. Not much change in  the wound over the left lateral malleolus. It is been 2 weeks since he was here. I changed him to endoform last time 10/2; the patient has been to see dermatology. Scheduled for surgery on 10/14. Using endoform to the wound over the lateral malleolus some improvement in the wound condition but not the surface area. He has severe chronic venous insufficiency. He also has some arterial disease he was angiogram by Dr. Doren Custard. He did not think that anything needed to be done from an intervention point of view. 10/8; his surgery is still scheduled for 10/14. It is concerning about whether he is healed or maintained skin integrity and is sutured area given the severity of his chronic venous insufficiency. The area we are following is on the left lateral malleolus. Some improvement in the surface area. No debridement today we have been using endoform 10/15; the patient finally had his surgery for the tumor on his left posterior calf. We did not look at this today. Still a punched out area in the lower left malleolus. Perhaps some mild improvement we have been using endoform We were denied for Gra[employer] and still have not heard back on [facility] Objective Constitutional Patient is hypertensive.. Pulse regular and within target range for patient.Marland Kitchen Respirations regular, non-labored and within target range.. Temperature is normal and within the target range for the patient.Marland Kitchen Appears in no distress. Vitals Time Taken: 4:35 PM, Height: 73 in, Weight: 205 lbs, BMI: 27, Temperature: 98.3 F, Pulse: 91 bpm, Respiratory Rate: 17 breaths/min, Blood Pressure: 159/93 mmHg. 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 General Notes: Wound exam; ooThe area on the left posterior calf is bandaged which was his original cancer. We did not disturb this. ooDebridement with a #3 curette of the area of the left lateral malleolus. I am able to get this to a very  healthy looking surface  albeit with considerable depth there is no palpable bone no evidence of infection. Integumentary (Hair, Skin) Wound #4 status is Open. Original cause of wound was Trauma. The wound is located on the Left,Lateral Malleolus. The wound measures 0.6cm length x 0.8cm width x 0.5cm depth; 0.377cm^2 area and 0.188cm^3 volume. There is Fat Layer (Subcutaneous Tissue) Exposed exposed. There is no tunneling or undermining noted. There is a small amount of serosanguineous drainage noted. The wound margin is well defined and not attached to the wound base. There is medium (34-66%) pink, pale granulation within the wound bed. There is a medium (34- 66%) amount of necrotic tissue within the wound bed including Adherent Slough. Wound #5 status is Healed - Surgical Closure. Original cause of wound was Gradually Appeared. The wound is located on the Left,Posterior Calf. The wound measures 0cm length x 0cm width x 0cm depth; 0cm^2 area and 0cm^3 volume. 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 There was a Excisional Skin/Subcutaneous Tissue Debridement with a total area of 0.48 sq cm performed by [name]., MD. With the following instrument(s): Curette to remove Viable and Non-Viable tissue/material. Material removed includes Subcutaneous Tissue and Slough and after achieving pain control using Lidocaine 4% Topical Solution. No specimens were taken. A time out was conducted at 17:07, prior to the start of the procedure. A Minimum amount of bleeding was controlled with Pressure. The procedure was  tolerated well with a pain level of 0 throughout and a pain level of 0 following the procedure. Post Debridement Measurements: 0.6cm length x 0.8cm width x 0.5cm depth; 0.188cm^3 volume. Character of Wound/Ulcer Post Debridement is improved. Severity of Tissue Post Debridement is: Fat layer exposed. Post procedure Diagnosis Wound #4: Same as Pre-Procedure Pre-procedure diagnosis of Wound #4 is a Venous Leg Ulcer located on the Left,Lateral Malleolus . There was a Three Layer Compression Therapy Procedure by Michael Hurst, RN. Post procedure Diagnosis Wound #4: Same as Pre-Procedure Plan Follow-up Appointments: Return Appointment in 1 week. - Thursday Dressing Change Frequency: Wound #4 Left,Lateral Malleolus: Do not change entire dressing for one week. Skin Barriers/Peri-Wound Care: TCA Cream or Ointment - liberal to lower leg Wound Cleansing: Wound #4 Left,Lateral Malleolus: May shower with protection. Primary Wound Dressing: Wound #4 Left,Lateral Malleolus: Endoform - moisten with saline, use small piece of gauze packing behind endoform Secondary Dressing: Wound #4 Left,Lateral Malleolus: Dry Gauze Other: - pad the left lateral and anterior portion of lower leg. Edema Control: 3 Layer Compression System - Left Lower Extremity - no cotton use kerlix. Avoid standing for long periods of time Elevate legs to the level of the heart or above for 30 minutes daily and/or when sitting, a frequency of: - throughout the day Exercise regularly 1. Continued with the endoform under 3 layer compression 2. Review where we are with [facility] application through his insurance. Grafix was already denied Electronic Signature(s) Signed: 03/18/2019 5:35:18 PM By: Michael Ham MD Entered By: [name] on 03/18/2019 17:33:21 -------------------------------------------------------------------------------- SuperBill Details Patient Name: Date of Service: [name], [name] 03/18/2019 Medical  Record [id number] Patient Account Number: [id number] Date of Birth/Sex: Treating RN: 07/08/1950 (68 y.o.  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, including hip Facility Procedures CPT4 Code Description: [id number] - DEB SUBQ TISSUE 20 SQ CM/< ICD-10 Diagnosis Description L97.321 Non-pressure chronic ulcer of left ankle limited to breakdo I87.332 Chronic venous hypertension (idiopathic) with ulcer and inf extremity Modifier: wn of skin lammation of le Quantity: 1 ft lower Physician Procedures CPT4 Code Description: [id number] - WC PHYS SUBQ TISS 20 SQ CM ICD-10 Diagnosis Description L97.321 Non-pressure chronic ulcer of left ankle limited to breakdo I87.332 Chronic venous hypertension (idiopathic) with ulcer and inf extremity Modifier: wn of skin lammation of lef Quantity: 1 t lower Electronic Signature(s) Signed: 03/18/2019 5:35:18 PM By: Michael Ham MD Entered By: [name] on 03/18/2019 17:34:01

## 2019-03-25 ENCOUNTER — Other Ambulatory Visit: Payer: Self-pay

## 2019-03-25 ENCOUNTER — Encounter (HOSPITAL_BASED_OUTPATIENT_CLINIC_OR_DEPARTMENT_OTHER): Payer: Medicare Other | Admitting: Internal Medicine

## 2019-03-25 DIAGNOSIS — I87332 Chronic venous hypertension (idiopathic) with ulcer and inflammation of left lower extremity: Secondary | ICD-10-CM | POA: Diagnosis not present

## 2019-03-25 NOTE — Progress Notes (Signed)
[name], [name] ([id number]) Visit Report for 03/25/2019 Debridement Details Patient Name: [name], [name]. Date of Service: 03/25/2019 4:00 PM Medical Record [id number] Patient Account Number: [id number] Date of Birth/Sex: Treating RN: [birth year]-[name] (68 y.o. [name] Primary Care Provider: PATIENT, NO Other Clinician: Referring Provider: Treating Provider/Extender:[name], [name], Designated Weeks in Treatment: 17 Debridement Performed for Wound #4 Left,Lateral Malleolus Assessment: Performed By: Physician [name]., MD Debridement Type: Debridement Severity of Tissue Pre Fat layer exposed Debridement: Level of Consciousness (Pre- Awake and Alert procedure): Pre-procedure Verification/Time Out Taken: Yes - 17:07 Start Time: 17:08 Pain Control: Lidocaine 4% Topical Solution Total Area Debrided (L x W): 0.9 (cm) x 1 (cm) = 0.9 (cm) Tissue and other material Viable, Non-Viable, Subcutaneous, Skin: Dermis , Fibrin/Exudate debrided: Level: Skin/Subcutaneous Tissue Debridement Description: Excisional Instrument: Curette Bleeding: Minimum Hemostasis Achieved: Pressure End Time: 17:10 Procedural Pain: 1 Post Procedural Pain: 3 Response to Treatment: Procedure was tolerated well Level of Consciousness Awake and Alert (Post-procedure): Post Debridement Measurements of Total Wound Length: (cm) 0.9 Width: (cm) 1 Depth: (cm) 0.5 Volume: (cm) 0.353 Character of Wound/Ulcer Post Improved Debridement: Severity of Tissue Post Debridement: Fat layer exposed Post Procedure Diagnosis Same as Pre-procedure Electronic Signature(s) Signed: 03/25/2019 6:29:04 PM By: Michael Ham MD Signed: 03/25/2019 6:35:09 PM By: [name] Entered By: [name] on 03/25/2019 17:53:02 -------------------------------------------------------------------------------- HPI Details Patient Name: Date of Service: [name]. 03/25/2019 4:00  PM Medical Record [id number] Patient Account Number: [id number] Date of Birth/Sex: Treating RN: [name] (68 y.o. [name] Primary Care Provider: PATIENT, NO Other Clinician: Referring Provider: Treating Provider/Extender:[name], [name], Designated Weeks in Treatment: 17 History of Present Illness HPI Description: 08/22/17-He is here for initial evaluation of a right heel wound, right toe wound and left heel fissures. He states that he has chronic dry, flaking skin and frequently has cracked heels. He states the right heel wound developed as such but he developed the wound approximately 3 weeks ago when pulling some skin off; he was unaware of the right toe ulcer. He was treating the right heel with over-the-counter triple antibiotic ointment and peroxide. He presented to urgent care on 3/12 with a 2 x 2 centimeter ulceration to the right heel and was discharged with a 10 day prescription for Bactrim. He does have a history of neuropathy, diet controlled diabetic. His does not know his most recent A1c but will be seeing his PCP next week, there is no evidence of an A1c in Epic. He denies smoking, does have a history of alcohol use, he admits to 5+/- beers weekly not daily. He did follow-up with cardiology on 3/18 with c/o ble edema, they ordered DVT study which he completed on 3/21. He reports that the bilateral DVT study was negative per the ultrasound technician, no report is available at this time. READMISSION 11/20/2018 this is a now 68 year old man who is here for review of wounds on his lower extremity on the left. We note that he was here on a single visit in March he tells [place] that over the last 2019. His major issue is that he dropped a box and hit the outside of his left lateral malleolus about 3 weeks ago. This is left him with a nonhealing area. He has been using Iodosorb ointment he had apparently from the last stay in this clinic. Also of note over the last 6  weeks he has noted an area that is raised and will scab over but then he traumatizes a  scab and then this bleeds and reopens. This is on the left posterior calf. All of this appears to be on the background of chronic venous insufficiency. He has had DVT studies in the past that were negative in 2019. He is also had arterial studies that showed an ABI on the right of 0.76 and on the left of 1.05. Past medical history; the patient states that he is not a diabetic although I note there is some suggested he was in the note from last year. He has a history of coronary artery disease. He tells me that he has had a history of skin cancer but he is not sure which one but that includes one on the left upper thigh. ABIs done previously 0.76 on the right and 1.05 on the left 6/26; patient readmitted to the clinic last week. He had a traumatic area over the left lateral malleolus as well as a hyper granulated growth on the left posterior calf. He also has severe chronic venous insufficiency. 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 We have been using Iodoflex to the traumatic wound under compression. 7/9; area over the left lateral malleolus continues to require debridement and we are continuing with Iodoflex under compression. He has severe chronic venous insufficiency. 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 This patient has severe bilateral venous insufficiency and venous hypertension with skin changes resulting. He requires bilateral reflux studies Finally he traumatized his right lateral calf while doing yard work about a week ago. The area is small with a flap of skin over the majority of the wound but I am not sure this is going to remain viable 7/16; still not a viable surface on the left lateral malleolus in fact this  wound is deeper. We have been using Iodoflex. 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 He has severe bilateral venous insufficiency and I have ordered reflux studies these apparently are booked for next week. Our intake nurse noted some yellowish-green drainage coming from the wound on the left ankle. Patient was insistent on talking about doing carotid ultrasounds and he was hoping to get these done with his reflux studies next week. I spent a few minutes talking to him about this I just could not put together in a logical way what he is concerned about. At the end of this I asked him to see his primary doctor about this he says he does not have a primary doctor advised him to get a primary doctor to go over this with him. It was not really clear where the level of concern was specific to carotid ultrasound 7/23; a better looking surface on the left lateral malleolus although it still requires debridement. He tells me he has an appointment to [facility] surgery center next week. Small area on the right lateral calf. The patient went to [facility] I do not think they took his compression wraps off. He did have reflux on the left in the common femoral greater saphenous and saphenofemoral junction and the great saphenous vein in the proximal thigh. 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 Looking at the numbers it would appear that the maximal reflux was in the great saphenous vein in the proximal thigh. As usual I am not sure what would be possible to do here. I will send him to vascular surgery 7/30; the patient still requires debridement in the left lateral malleolus although in general the wound is still deep and and  punched out. We are using Sorbact. The area on the right lateral calf is improved. 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 The patient does not have a vascular surgery appointment nor does he have a skin surgery center appointment. I am really not sure what the issues are here although I know vascular surgery as well behind. 8/6-Patient does not have a vascular appointment nor does he have a skin surgery appointment firmed up at this time although he says he is called them both, impressed upon him that he should see whoever is available for the vascular so the vein studies can be interpreted. 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 He is here for the left lateral malleoli wound for which we are using SOrbact, and he is disappointed the wound is not healing 8/21; patient saw Dr. Doren Custard on 01/21/2019. Dr. Doren Custard was concerned about his arterial flow. Noting that he had trouble feeling the dorsalis pedis and posterior tibial arteries also noteworthy that the Doppler had a barely biphasic posterior tibial signal with a monophasic but fairly brisk dorsalis pedis signal. He is booked for an angiogram next Friday. We have been using 3 layer compression on him. 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 Given Dr. Mee Hives concern it was difficult for me to be comfortable with him going ahead with the cancer extraction and I have suggested canceling this. Finally he has an increase in the  erythematous contact dermatitis in the entire wrapped area of the left leg which is probably an allergy to the contact layer of the 3 layer. 8/27; he is going for his angiogram tomorrow by Dr. Doren Custard. We cancel the Mohs surgery on the tumor on his posterior calf pending the angiogram tomorrow. If this shows adequate blood flow to this area I think we can rebook this. His main wound is on the left lateral malleolus a small refractory punched-out area. We have been using Hydrofera Blue 9/3; his angiogram was done as scheduled. He does indeed have PAD. The common femoral, deep femoral superficial femoral and popliteal arteries will are all widely patent. 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 The overall thought was that he had enough adequate circulation to heal his venous ulcer and continue to use mild elevation and compression. Noted that he had biphasic posterior  tibial signals with a Doppler ABI of 0.91. It was not felt that the stenosis in the proximal tibial artery would be associated with increased blood flow he will follow-up with Dr. Doren Custard in 1 month He now needs to get the skin surgery appointment set up. The area is growing larger and bleeding frequently on the posterior left calf per the patient 02/11/19-Patient is back after 1 week, results of his vascular studies are noted, patient needs to have a skin surgery appointment established apparently he did not return the calls this week. The area on the posterior calf is where the skin surgery needs to be done, the left lateral malleoli are wound appears to be about the same we will using Hydrofera Blue change 9/24; patient finally has a booking to deal with the cancer on his posterior calf I believe with the skin surgery center. Not much change in  the wound over the left lateral malleolus. It is been 2 weeks since he was here. I changed him to endoform last time 10/2; the patient has been to see dermatology. Scheduled for surgery on 10/14. Using endoform to the wound over the lateral malleolus some improvement in the wound condition but not the surface area. He has severe chronic venous insufficiency. He also has some arterial disease he was angiogram by Dr. Doren Custard. He did not think that anything needed to be done from an intervention point of view. 10/8; his surgery is still scheduled for 10/14. It is concerning about whether he is healed or maintained skin integrity and is sutured area given the severity of his chronic venous insufficiency. The area we are following is on the left lateral malleolus. Some improvement in the surface area. No debridement today we have been using endoform 10/15; the patient finally had his surgery for the tumor on his left posterior calf. We did not look at this today. Still a punched out area in the lower left malleolus. Perhaps some mild improvement we have been using endoform We were denied for Gra[facility] and still have not heard back on [facility] 10/22; no real change in this wound. I am able to debride it to a healthy looking surface but the next week there is recurrent debris and no improvement in depth. Punched out over the left lateral malleolus. We have not heard back on [facility]. We were denied for Grafix Electronic Signature(s) Signed: 03/25/2019 6:29:04 PM By: Michael Ham MD Entered By: [name] on 03/25/2019 17:53:37 -------------------------------------------------------------------------------- Physical Exam Details Patient Name: Date of Service: DAELYN, TOPF 03/25/2019 4:00 PM Medical Record [id number] Patient Account Number: [id number] Date of Birth/Sex: Treating RN: [birth year]-[name] (68 y.o. [name] Primary Care Provider: PATIENT, NO Other Clinician: Referring  Provider: Treating Provider/Extender:Jenin Birdsall, [name], Designated Weeks in Treatment: 17 Constitutional Sitting or standing Blood Pressure is within target range for patient.. Pulse regular and within target range for patient.Marland Kitchen Respirations regular, non-labored and within target range.. Temperature is normal and within the target range for the patient.Marland Kitchen Appears in no distress. Respiratory work of breathing is normal. Cardiovascular Pedal pulses palpable and strong bilaterally.. Integumentary (Hair, Skin) Severe bilateral venous insufficiency. Notes Wound exam; the areas on the left lateral malleolus. No change in this wound adherent necrotic debris removed from the wound surface with a #3 curette post debridement this really cleans up quite nicely and the surface looks healthy. He has severe venous hypertension and I am going to try to increase him to a 4-layer compression. We did not disturb the skin  surgery site on the posterior calf Electronic Signature(s) Signed: 03/25/2019 6:29:04 PM By: Michael Ham MD Entered By: [name] on 03/25/2019 17:54:51 -------------------------------------------------------------------------------- Physician Orders Details Patient Name: Date of Service: TAVIONE, REIGNER 03/25/2019 4:00 PM Medical Record [id number] Patient Account Number: [id number] Date of Birth/Sex: Treating RN: [birth year] [name] (68 y.o. [name] Primary Care Provider: PATIENT, NO Other Clinician: Referring Provider: Treating Provider/Extender:Iosefa Weintraub, [name], Designated Weeks in Treatment: 17 Verbal / Phone Orders: No Diagnosis Coding ICD-10 Coding Code Description I87.332 Chronic venous hypertension (idiopathic) with ulcer and inflammation of left lower extremity L97.321 Non-pressure chronic ulcer of left ankle limited to breakdown of skin C44.799 Other specified malignant neoplasm of skin of left lower limb, including hip Follow-up  Appointments Return Appointment in 2 weeks. - Thursday. Nurse Visit: - Wednesday or Thursday. Dressing Change Frequency Wound #4 Left,Lateral Malleolus Do not change entire dressing for one week. Skin Barriers/Peri-Wound Care TCA Cream or Ointment - liberal to lower leg Wound Cleansing Wound #4 Left,Lateral Malleolus May shower with protection. Primary Wound Dressing Wound #4 Left,Lateral Malleolus Other: - cutimed sorbact with hydrogel. Secondary Dressing Wound #4 Left,Lateral Malleolus Dry Gauze Other: - pad the left lateral and anterior portion of lower leg. Edema Control 4 layer compression: Left lower extremity - no kerlix no cotton. pad with foam to anterior portion of lower leg. Avoid standing for long periods of time Elevate legs to the level of the heart or above for 30 minutes daily and/or when sitting, a frequency of: - throughout the day Exercise regularly Electronic Signature(s) Signed: 03/25/2019 6:29:04 PM By: Michael Ham MD Signed: 03/25/2019 6:35:09 PM By: [name] Entered By: [name] on 03/25/2019 17:14:25 -------------------------------------------------------------------------------- Problem List Details Patient Name: Date of Service: DAISON, STEGEN 03/25/2019 4:00 PM Medical Record [id number] Patient Account Number: [id number] Date of Birth/Sex: Treating RN: Michael [birth year], [name] (68 y.o. [name] Primary Care Provider: PATIENT, NO Other Clinician: Referring Provider: Treating Provider/Extender:Nene Aranas, [name], Designated Weeks in Treatment: 17 Active Problems ICD-10 Evaluated Encounter Code Description Active Date Today Diagnosis I87.332 Chronic venous hypertension (idiopathic) with ulcer 11/20/2018 No Yes and inflammation of left lower extremity L97.321 Non-pressure chronic ulcer of left ankle limited to 11/20/2018 No Yes breakdown of skin C44.799 Other specified malignant neoplasm of skin of left 11/27/2018 No  Yes lower limb, including hip Inactive Problems ICD-10 Code Description Active Date Inactive Date L97.211 Non-pressure chronic ulcer of right calf limited to breakdown of 12/10/2018 12/10/2018 skin Resolved Problems Electronic Signature(s) Signed: 03/25/2019 6:29:04 PM By: Michael Ham MD Entered By: [name] on 03/25/2019 17:52:45 -------------------------------------------------------------------------------- Progress Note Details Patient Name: Date of Service: [name] 03/25/2019 4:00 PM Medical Record [id number] Patient Account Number: [id number] Date of Birth/Sex: Treating RN: [birth year]-[name] (68 y.o. [name] Primary Care Provider: PATIENT, NO Other Clinician: Referring Provider: Treating Provider/Extender:Geral Coker, [name], Designated Weeks in Treatment: 17 Subjective History of Present Illness (HPI) 08/22/17-He is here for initial evaluation of a right heel wound, right toe wound and left heel fissures. He states that he has chronic dry, flaking skin and frequently has cracked heels. He states the right heel wound developed as such but he developed the wound approximately 3 weeks ago when pulling some skin off; he was unaware of the right toe ulcer. He was treating the right heel with over-the-counter triple antibiotic ointment and peroxide. He presented to urgent care on 3/12 with a 2 x 2 centimeter ulceration to the right heel and was discharged with a 10 day prescription for  Bactrim. He does have a history of neuropathy, diet controlled diabetic. His does not know his most recent A1c but will be seeing his PCP next week, there is no evidence of an A1c in Epic. He denies smoking, does have a history of alcohol use, he admits to 5+/- beers weekly not daily. He did follow-up with cardiology on 3/18 with c/o ble edema, they ordered DVT study which he completed on 3/21. He reports that the bilateral DVT study was negative per the ultrasound  technician, no report is available at this time. READMISSION 11/20/2018 this is a now 68 year old man who is here for review of wounds on his lower extremity on the left. We note that he was here on a single visit in March he tells [place] that over the last 2019. His major issue is that he dropped a box and hit the outside of his left lateral malleolus about 3 weeks ago. This is left him with a nonhealing area. He has been using Iodosorb ointment he had apparently from the last stay in this clinic. Also of note over the last 6 weeks he has noted an area that is raised and will scab over but then he traumatizes a scab and then this bleeds and reopens. This is on the left posterior calf. All of this appears to be on the background of chronic venous insufficiency. He has had DVT studies in the past that were negative in 2019. He is also had arterial studies that showed an ABI on the right of 0.76 and on the left of 1.05. Past medical history; the patient states that he is not a diabetic although I note there is some suggested he was in the note from last year. He has a history of coronary artery disease. He tells me that he has had a history of skin cancer but he is not sure which one but that includes one on the left upper thigh. ABIs done previously 0.76 on the right and 1.05 on the left 6/26; patient readmitted to the clinic last week. He had a traumatic area over the left lateral malleolus as well as a hyper granulated growth on the left posterior calf. He also has severe chronic venous insufficiency. 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 We have been using Iodoflex to the traumatic wound under compression. 7/9; area over the left lateral malleolus continues to require debridement and we are continuing with Iodoflex under compression. He has severe chronic venous insufficiency. The biopsy I did that showed squamous cell carcinoma with the area  on the posterior left calf requires a referral to skin surgery, so far he does not have an appointment. This patient has severe bilateral venous insufficiency and venous hypertension with skin changes resulting. He requires bilateral reflux studies Finally he traumatized his right lateral calf while doing yard work about a week ago. The area is small with a flap of skin over the majority of the wound but I am not sure this is going to remain viable 7/16; still not a viable surface on the left lateral malleolus in fact this wound is deeper. We have been using Iodoflex. 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 He has severe bilateral venous insufficiency and I have ordered reflux studies these apparently are booked for next week. Our  intake nurse noted some yellowish-green drainage coming from the wound on the left ankle. Patient was insistent on talking about doing carotid ultrasounds and he was hoping to get these done with his reflux studies next week. I spent a few minutes talking to him about this I just could not put together in a logical way what he is concerned about. At the end of this I asked him to see his primary doctor about this he says he does not have a primary doctor advised him to get a primary doctor to go over this with him. It was not really clear where the level of concern was specific to carotid ultrasound 7/23; a better looking surface on the left lateral malleolus although it still requires debridement. He tells me he has an appointment to [facility] surgery center next week. Small area on the right lateral calf. The patient went to [facility] I do not think they took his compression wraps off. He did have reflux on the left in the common femoral greater saphenous and saphenofemoral junction and the great saphenous vein in the  proximal thigh. 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 Looking at the numbers it would appear that the maximal reflux was in the great saphenous vein in the proximal thigh. As usual I am not sure what would be possible to do here. I will send him to vascular surgery 7/30; the patient still requires debridement in the left lateral malleolus although in general the wound is still deep and and punched out. We are using Sorbact. The area on the right lateral calf is improved. 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 The patient does not have a vascular surgery appointment nor does he have a skin surgery center appointment. I am really not sure what the issues are here although I know vascular surgery as well behind. 8/6-Patient does not have a vascular appointment nor does he have a skin surgery appointment firmed up at this time although he says he is called them both, impressed upon him that he should see whoever is available for the vascular so the vein studies can be interpreted. 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 He is here for the left lateral malleoli wound for which we are using SOrbact, and he is disappointed the wound is not healing 8/21; patient saw Dr. Doren Custard on 01/21/2019. Dr. Doren Custard was concerned about his arterial flow. Noting that he had trouble feeling the dorsalis pedis and posterior tibial  arteries also noteworthy that the Doppler had a barely biphasic posterior tibial signal with a monophasic but fairly brisk dorsalis pedis signal. He is booked for an angiogram next Friday. We have been using 3 layer compression on him. 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 Given Dr. Mee Hives concern it was difficult for me  to be comfortable with him going ahead with the cancer extraction and I have suggested canceling this. Finally he has an increase in the erythematous contact dermatitis in the entire wrapped area of the left leg which is probably an allergy to the contact layer of the 3 layer. 8/27; he is going for his angiogram tomorrow by Dr. Doren Custard. We cancel the Mohs surgery on the tumor on his posterior calf pending the angiogram tomorrow. If this shows adequate blood flow to this area I think we can rebook this. His main wound is on the left lateral malleolus a small refractory punched-out area. We have been using Hydrofera Blue 9/3; his angiogram was done as scheduled. He does indeed have PAD. The common femoral, deep femoral superficial femoral and popliteal arteries will are all widely patent. 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 The overall thought was that he had enough adequate circulation to heal his venous ulcer and continue to use mild elevation and compression. Noted that he had biphasic posterior tibial signals with a Doppler ABI of 0.91. It was not felt that the stenosis in the proximal tibial artery would be associated with increased blood flow he will follow-up with Dr. Doren Custard in 1 month He now needs to get the skin surgery appointment set up. The area is growing larger and bleeding frequently on the posterior left calf  per the patient 02/11/19-Patient is back after 1 week, results of his vascular studies are noted, patient needs to have a skin surgery appointment established apparently he did not return the calls this week. The area on the posterior calf is where the skin surgery needs to be done, the left lateral malleoli are wound appears to be about the same we will using Hydrofera Blue change 9/24; patient finally has a booking to deal with the cancer on his posterior calf I believe with the skin surgery center. Not much change in the wound over the left lateral malleolus. It is been 2 weeks since he was here. I changed him to endoform last time 10/2; the patient has been to see dermatology. Scheduled for surgery on 10/14. Using endoform to the wound over the lateral malleolus some improvement in the wound condition but not the surface area. He has severe chronic venous insufficiency. He also has some arterial disease he was angiogram by Dr. Doren Custard. He did not think that anything needed to be done from an intervention point of view. 10/8; his surgery is still scheduled for 10/14. It is concerning about whether he is healed or maintained skin integrity and is sutured area given the severity of his chronic venous insufficiency. The area we are following is on the left lateral malleolus. Some improvement in the surface area. No debridement today we have been using endoform 10/15; the patient finally had his surgery for the tumor on his left posterior calf. We did not look at this today. Still a punched out area in the lower left malleolus. Perhaps some mild improvement we have been using endoform We were denied for [facility] and still have not heard back on [facility] 10/22; no real change in this wound. I am able to debride it to a healthy looking surface but the next week there is recurrent debris and no improvement in depth. Punched out over the left lateral  malleolus. We have not heard back on [facility]. We were denied  for Grafix Objective Constitutional Sitting or standing Blood Pressure is within target range for patient.. Pulse regular and within target range for patient.Marland Kitchen Respirations regular, non-labored and within target range.. Temperature is normal and within the target range for the patient.Marland Kitchen Appears in no distress. Vitals Time Taken: 4:34 PM, Height: 73 in, Weight: 205 lbs, BMI: 27, Temperature: 98.6 F, Pulse: 103 bpm, Respiratory Rate: 16 breaths/min, Blood Pressure: 135/58 mmHg. Respiratory work of breathing is normal. Cardiovascular Pedal pulses palpable and strong bilaterally.. General Notes: Wound exam; the areas on the left lateral malleolus. No change in this wound adherent necrotic debris removed from the wound surface with a #3 curette post debridement this really cleans up quite nicely and the surface looks healthy. He has severe venous hypertension and I am going to try to increase him to a 4-layer compression. We did not disturb the skin surgery site on the posterior calf Integumentary (Hair, Skin) Severe bilateral venous insufficiency. Wound #4 status is Open. Original cause of wound was Trauma. The wound is located on the Left,Lateral Malleolus. The wound measures 0.9cm length x 1cm width x 0.5cm depth; 0.707cm^2 area and 0.353cm^3 volume. There is Fat Layer (Subcutaneous Tissue) Exposed exposed. There is no tunneling or undermining noted. There is a small amount of serosanguineous drainage noted. The wound margin is well defined and not attached to the wound base. There is small (1-33%) pink, pale granulation within the wound bed. There is a large (67-100%) amount of necrotic tissue within the wound bed including Adherent Slough. 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 There was a Excisional Skin/Subcutaneous Tissue Debridement with a total area of 0.9 sq cm performed by [name]., MD. With the following instrument(s): Curette to remove Viable and Non-Viable tissue/material. Material removed includes Subcutaneous Tissue, Skin: Dermis, and Fibrin/Exudate after achieving pain control using Lidocaine 4% Topical Solution. A time out was conducted at 17:07, prior to the start of the procedure. A Minimum amount of bleeding was controlled with Pressure. The procedure was tolerated well with a pain level of 1 throughout and a pain level of 3 following the procedure. Post Debridement Measurements: 0.9cm length x 1cm width x 0.5cm depth; 0.353cm^3 volume. Character of Wound/Ulcer Post Debridement is improved. Severity of Tissue Post Debridement is: Fat layer exposed. Post procedure Diagnosis Wound #4: Same as Pre-Procedure Pre-procedure diagnosis of Wound #4 is a Venous Leg Ulcer located on the Left,Lateral Malleolus . There was a Four Layer Compression Therapy Procedure with a pre-treatment ABI of 1 by Michael Pilling, RN. Post procedure Diagnosis Wound #4: Same as Pre-Procedure Plan Follow-up Appointments: Return Appointment in 2 weeks. - Thursday. Nurse Visit: - Wednesday or Thursday. Dressing Change Frequency: Wound #4 Left,Lateral Malleolus: Do not change entire dressing for one week. Skin Barriers/Peri-Wound Care: TCA Cream or Ointment - liberal to lower leg Wound Cleansing: Wound #4 Left,Lateral Malleolus: May shower with protection. Primary Wound Dressing: Wound #4 Left,Lateral Malleolus: Other: - cutimed sorbact with hydrogel. Secondary Dressing: Wound #4 Left,Lateral Malleolus: Dry Gauze Other: - pad the left lateral and anterior portion of  lower  leg. Edema Control: 4 layer compression: Left lower extremity - no kerlix no cotton. pad with foam to anterior portion of lower leg. Avoid standing for long periods of time Elevate legs to the level of the heart or above for 30 minutes daily and/or when sitting, a frequency of: - throughout the day Exercise regularly 1. I change the primary dressing to Sorbact with hydrogel. 2. Still wondering about [employer]) Signed: 03/25/2019 6:29:04 PM By: Michael Ham MD Entered By: [name] on 03/25/2019 17:55:48 -------------------------------------------------------------------------------- SuperBill Details Patient Name: Date of Service: RAFAEL, COGAR 03/25/2019 Medical Record [id number] Patient Account Number: [id number] Date of Birth/Sex: Treating RN: [name] (68 y.o. [name], Meta.Reding Primary Care Provider: PATIENT, NO Other Clinician: Referring Provider: Treating Provider/Extender:Teisha Trowbridge, [name], Designated Weeks in Treatment: 17 Diagnosis Coding ICD-10 Codes Code Description I87.332 Chronic venous hypertension (idiopathic) with ulcer and inflammation of left lower extremity L97.321 Non-pressure chronic ulcer of left ankle limited to breakdown of skin C44.799 Other specified malignant neoplasm of skin of left lower limb, including hip Facility Procedures CPT4 Code Description: [id number] - DEB SUBQ TISSUE 20 SQ CM/< ICD-10 Diagnosis Description L97.321 Non-pressure chronic ulcer of left ankle limited to brea Modifier: kdown of skin Quantity: 1 Physician Procedures CPT4 Code Description: [id number] - WC PHYS SUBQ TISS 20 SQ CM ICD-10 Diagnosis Description L97.321 Non-pressure chronic ulcer of left ankle limited to break Modifier: down of skin Quantity: 1 Electronic Signature(s) Signed: 03/25/2019 6:29:04 PM By: Michael Ham MD Entered By: [name] on 03/25/2019 17:57:04

## 2019-03-26 NOTE — Progress Notes (Signed)
CORTNEY, MCKINNEY ([id number]) Visit Report for 03/25/2019 Arrival Information Details Patient Name: Date of Service: [name], [name] 03/25/2019 4:00 PM Medical Record [id number] Patient Account Number: [id number] Date of Birth/Sex: Treating RN: 12/19/1950 (68 y.o. [name] Primary Care Jadelyn Elks: PATIENT, NO Other Clinician: Referring Artavius Stearns: Treating Austen Oyster/Extender:Robson, [name], Designated Weeks in Treatment: 63 Visit Information History Since Last Visit Added or deleted any medications: No Patient Arrived: [name] Any new allergies or adverse reactions: No Arrival Time: 16:28 Had a fall or experienced change in No Accompanied By: alone activities of daily living that may affect Transfer Assistance: None risk of falls: Patient Identification Verified: Yes Signs or symptoms of abuse/neglect since last No Secondary Verification Process Yes visito Completed: Hospitalized since last visit: No Patient Requires Transmission- No Implantable device outside of the clinic excluding No Based Precautions: cellular tissue based products placed in the center Patient Has Alerts: Yes ABIs 08/2018 L1.05 since last visit: Patient Alerts: Has Dressing in Place as Prescribed: Yes R0.76 Has Compression in Place as Prescribed: Yes Pain Present Now: No Electronic Signature(s) Signed: 03/26/2019 5:59:58 PM By: [name] RN, BSN Entered By: [name] on 03/25/2019 16:29:00 -------------------------------------------------------------------------------- Compression Therapy Details Patient Name: Date of Service: [name] 03/25/2019 4:00 PM Medical Record [id number] Patient Account Number: [id number] Date of Birth/Sex: Treating RN: 1951-01-07 (68 y.o. [name] Primary Care Azlyn Wingler: PATIENT, NO Other Clinician: Referring Nimo Verastegui: Treating Canary Fister/Extender:Robson, [name], Designated Weeks in Treatment: 17 Compression  Therapy Performed for Wound Wound #4 Left,Lateral Malleolus Assessment: Performed By: Clinician [name], RN Compression Type: Four Layer Pre Treatment ABI: 1 Post Procedure Diagnosis Same as Pre-procedure Electronic Signature(s) Signed: 03/25/2019 6:35:09 PM By: [name] Entered By: [name] on 03/25/2019 17:09:15 -------------------------------------------------------------------------------- Encounter Discharge Information Details Patient Name: Date of Service: [name], [name] 03/25/2019 4:00 PM Medical Record [id number] Patient Account Number: [id number] Date of Birth/Sex: Treating RN: Sep 20, 1950 (68 y.o. [name] Primary Care Lathan Gieselman: PATIENT, NO Other Clinician: Referring Hilliard Borges: Treating Kjuan Seipp/Extender:Robson, [name], Designated Weeks in Treatment: 17 Encounter Discharge Information Items Post Procedure Vitals Discharge Condition: Stable Temperature (F): 98.6 Ambulatory Status: Cane Pulse (bpm): 103 Discharge Destination: Home Respiratory Rate (breaths/min): 18 Transportation: Private Auto Blood Pressure (mmHg): 135/58 Accompanied By: self Schedule Follow-up Appointment: Yes Clinical Summary of Care: Electronic Signature(s) Signed: 03/25/2019 6:35:09 PM By: [name] Entered By: [name] on 03/25/2019 17:52:34 -------------------------------------------------------------------------------- Lower Extremity Assessment Details Patient Name: Date of Service: [name], [name] 03/25/2019 4:00 PM Medical Record [id number] Patient Account Number: [id number] Date of Birth/Sex: Treating RN: November 11, 1950 (68 y.o. [name] Primary Care Avielle Imbert: PATIENT, NO Other Clinician: Referring Malacai Grantz: Treating Alleen Kehm/Extender:Robson, [name], Designated Weeks in Treatment: 17 Edema Assessment Assessed: [Left: No] [Right: No] Edema: [Left: Yes] [Right: No] Calf Left: Right: Point of Measurement: 35 cm  From Medial Instep 34 cm cm Ankle Left: Right: Point of Measurement: 9 cm From Medial Instep 26.2 cm cm Vascular Assessment Pulses: Dorsalis Pedis Palpable: [Left:Yes] Electronic Signature(s) Signed: 03/26/2019 5:59:58 PM By: [name] RN, BSN Entered By: [name] on 03/25/2019 16:37:59 -------------------------------------------------------------------------------- Multi Wound Chart Details Patient Name: Date of Service: [name]. 03/25/2019 4:00 PM Medical Record [id number] Patient Account Number: [id number] Date of Birth/Sex: Treating RN: Jan 03, 1951 (68 y.o. [name] Primary Care Debora Stockdale: PATIENT, NO Other Clinician: Referring Cayden Granholm: Treating Dhruva Orndoff/Extender:Robson, [name], Designated Weeks in Treatment: 17 Vital Signs Height(in): 73 Pulse(bpm): 103 Weight(lbs): 205 Blood Pressure(mmHg): 135/58 Body Mass Index(BMI): 27 Temperature(F): 98.6 Respiratory 16  Rate(breaths/min): Photos: [4:No Photos] [N/A:N/A] Wound Location: [4:Left Malleolus - Lateral] [N/A:N/A] Wounding Event: [4:Trauma] [N/A:N/A] Primary Etiology: [4:Venous Leg Ulcer] [N/A:N/A] Comorbid History: [4:Arrhythmia, Coronary Artery N/A Disease, Type II Diabetes] Date Acquired: [4:11/02/2018] [N/A:N/A] Weeks of Treatment: [4:17] [N/A:N/A] Wound Status: [4:Open] [N/A:N/A] Measurements L x W x D 0.9x1x0.5 [N/A:N/A] (cm) Area (cm) : [4:0.707] [N/A:N/A] Volume (cm) : [4:0.353] [N/A:N/A] % Reduction in Area: [4:45.40%] [N/A:N/A] % Reduction in Volume: -171.50% [N/A:N/A] Classification: [4:Full Thickness Without Exposed Support Structures] [N/A:N/A N/A] Exudate Amount: [4:Small] [N/A:N/A N/A] Exudate Type: [4:Serosanguineous] [N/A:N/A N/A] Exudate Color: [4:red, brown] [N/A:N/A N/A] Wound Margin: [4:Well defined, not attached N/A] [N/A:N/A] Granulation Amount: [4:Small (1-33%)] [N/A:N/A N/A] Granulation Quality: [4:Pink, Pale] [N/A:N/A N/A] Necrotic Amount:  [4:Large (67-100%)] [N/A:N/A N/A] Exposed Structures: [4:Fat Layer (Subcutaneous N/A Tissue) Exposed: Yes Fascia: No Tendon: No Muscle: No Joint: No Bone: No] [N/A:N/A] Epithelialization: [4:Small (1-33%)] [N/A:N/A N/A] Debridement: [4:Debridement - Excisional N/A] [N/A:N/A] Pre-procedure [4:17:07] [N/A:N/A N/A] Verification/Time Out Taken: Pain Control: [4:Lidocaine 4% Topical Solution] [N/A:N/A N/A] Tissue Debrided: [4:Subcutaneous] [N/A:N/A N/A] Level: [4:Skin/Subcutaneous Tissue] [N/A:N/A N/A] Debridement Area (sq cm):0.9 [N/A:N/A N/A] Instrument: [4:Curette] [N/A:N/A N/A] Bleeding: [4:Minimum] [N/A:N/A N/A] Hemostasis Achieved: [4:Pressure] [N/A:N/A N/A] Procedural Pain: [4:1] [N/A:N/A N/A] Post Procedural Pain: [4:3] [N/A:N/A N/A] Debridement Treatment Procedure was tolerated [N/A:N/A N/A] Response: [4:well] Post Debridement [4:0.9x1x0.5] [N/A:N/A N/A] Measurements L x W x D (cm) Post Debridement [4:0.353] [N/A:N/A N/A] Volume: (cm) Procedures Performed: Compression Therapy [4:Debridement] [N/A:N/A N/A] Treatment Notes Wound #4 (Left, Lateral Malleolus) 1. Cleanse With Wound Cleanser Soap and water 2. Periwound Care TCA Ointment 3. Primary Dressing Applied Hydrogel or K-Y Jelly Other primary dressing (specifiy in notes) 4. Secondary Dressing Dry Gauze 6. Support Layer Applied 4 layer compression wrap Notes primary dressing hydrogel with cutimed sorbact. Electronic Signature(s) Signed: 03/25/2019 6:29:04 PM By: Linton Ham MD Signed: 03/25/2019 6:35:09 PM By: [name] Entered By: Linton Ham on 03/25/2019 17:52:51 -------------------------------------------------------------------------------- Multi-Disciplinary Care Plan Details Patient Name: Date of Service: [name], [name] 03/25/2019 4:00 PM Medical Record [id number] Patient Account Number: [id number] Date of Birth/Sex: Treating RN: [birth year] (68 y.o. [name] Primary Care  Seaborn Nakama: PATIENT, NO Other Clinician: Referring Creed Kail: Treating Charnise Lovan/Extender:Robson, [name], Designated Weeks in Treatment: 17 Active Inactive Venous Leg Ulcer Nursing Diagnoses: Knowledge deficit related to disease process and management Potential for venous Insuffiency (use before diagnosis confirmed) Goals: Patient will maintain optimal edema control Date Initiated: 11/20/2018 Target Resolution Date: 04/02/2019 Goal Status: Active Patient/caregiver will verbalize understanding of disease process and disease management Date Initiated: 11/20/2018 Date Inactivated: 12/17/2018 Target Resolution Date: 12/18/2018 Goal Status: Met Interventions: Assess peripheral edema status every visit. Compression as ordered Provide education on venous insufficiency Treatment Activities: Therapeutic compression applied : 11/20/2018 Notes: Electronic Signature(s) Signed: 03/25/2019 6:35:09 PM By: [name] Entered By: [name] on 03/25/2019 16:39:17 -------------------------------------------------------------------------------- Pain Assessment Details Patient Name: Date of Service: [name], [name] 03/25/2019 4:00 PM Medical Record [id number] Patient Account Number: [id number] Date of Birth/Sex: Treating RN: July 26, 1950 (67 y.o. [name] Primary Care Deneka Greenwalt: PATIENT, NO Other Clinician: Referring Jewelz Ricklefs: Treating Angenette Daily/Extender:Robson, [name], Designated Weeks in Treatment: 17 Active Problems Location of Pain Severity and Description of Pain Patient Has Paino Yes Site Locations Pain Location: Pain in Ulcers With Dressing Change: Yes Duration of the Pain. Constant / Intermittento Intermittent Rate the pain. Current Pain Level: 5 Character of Pain Describe the Pain: Throbbing Pain Management and Medication Current Pain Management: Medication: Yes Cold Application: No Rest: No Massage: No Activity: No T.E.N.S.: No  Heat  Application: No Leg drop or elevation: No Is the Current Pain Management Adequate: Adequate How does your wound impact your activities of daily livingo Sleep: No Bathing: No Appetite: No Relationship With Others: No Bladder Continence: No Emotions: No Bowel Continence: No Work: No Toileting: No Drive: No Dressing: No Hobbies: No Electronic Signature(s) Signed: 03/26/2019 5:59:58 PM By: [name] RN, BSN Entered By: [name] on 03/25/2019 16:33:11 -------------------------------------------------------------------------------- Patient/Caregiver Education Details Patient Name: Date of Service: [name] 10/22/2020andnbsp4:00 PM Medical Record [id number] Patient Account Number: [id number] Date of Birth/Gender: Treating RN: November 22, 1950 (68 y.o. [name] Primary Care Physician: PATIENT, NO Other Clinician: Referring Physician: Treating Physician/Extender:Robson, [name], Designated Weeks in Treatment: 17 Education Assessment Education Provided To: Patient Education Topics Provided Wound/Skin Impairment: Handouts: Caring for Your Ulcer Methods: Explain/Verbal Responses: Reinforcements needed Electronic Signature(s) Signed: 03/25/2019 6:35:09 PM By: [name] Entered By: [name] on 03/25/2019 16:40:10 -------------------------------------------------------------------------------- Wound Assessment Details Patient Name: Date of Service: [name], [name] 03/25/2019 4:00 PM Medical Record [id number] Patient Account Number: [id number] Date of Birth/Sex: Treating RN: [birth year] (68 y.o. [name] Primary Care Martasia Talamante: PATIENT, NO Other Clinician: Referring Suraiya Dickerson: Treating Dabid Godown/Extender:Robson, [name], Designated Weeks in Treatment: 17 Wound Status Wound Number: 4 Primary Venous Leg Ulcer Etiology: Wound Location: Left Malleolus - Lateral Wound Open Wounding Event: Trauma Status: Date  Acquired: 11/02/2018 Comorbid Arrhythmia, Coronary Artery Disease, Weeks Of Treatment: 17 History: Type II Diabetes Clustered Wound: No Photos Wound Measurements Length: (cm) 0.9 % Red Width: (cm) 1 % Red Depth: (cm) 0.5 Epith Area: (cm) 0.707 Tunn Volume: (cm) 0.353 Unde Wound Description Full Thickness Without Exposed Support Classification: Structures Wound Well defined, not attached Margin: Exudate Small Amount: Exudate Serosanguineous Type: Exudate red, brown Color: Wound Bed Granulation Amount: Small (1-33%) Granulation Quality: Pink, Pale Necrotic Amount: Large (67-100%) Necrotic Quality: Adherent Slough Foul Odor After Cleansing: No Slough/Fibrino Yes Exposed Structure Fascia Exposed: No Fat Layer (Subcutaneous Tissue) Exposed: Yes Tendon Exposed: No Muscle Exposed: No Joint Exposed: No Bone Exposed: No uction in Area: 45.4% uction in Volume: -171.5% elialization: Small (1-33%) eling: No rmining: No Treatment Notes Wound #4 (Left, Lateral Malleolus) 1. Cleanse With Wound Cleanser Soap and water 2. Periwound Care TCA Ointment 3. Primary Dressing Applied Hydrogel or K-Y Jelly Other primary dressing (specifiy in notes) 4. Secondary Dressing Dry Gauze 6. Support Layer Applied 4 layer compression wrap Notes primary dressing hydrogel with cutimed sorbact. foam applied to anterior lower leg for protection. Electronic Signature(s) Signed: 03/26/2019 4:09:04 PM By: Mikeal Hawthorne EMT/HBOT Signed: 03/26/2019 5:59:58 PM By: [name] RN, BSN Entered By: Mikeal Hawthorne on 03/26/2019 13:55:13 -------------------------------------------------------------------------------- Vitals Details Patient Name: Date of Service: [name], [name] 03/25/2019 4:00 PM Medical Record [id number] Patient Account Number: [id number] Date of Birth/Sex: Treating RN: January 31, 1951 (68 y.o. [name] Primary Care Jashawna Reever: PATIENT, NO Other  Clinician: Referring Steffany Schoenfelder: Treating Ozell Juhasz/Extender:Robson, [name], Designated Weeks in Treatment: 17 Vital Signs Time Taken: 16:34 Temperature (F): 98.6 Height (in): 73 Pulse (bpm): 103 Weight (lbs): 205 Respiratory Rate (breaths/min): 16 Body Mass Index (BMI): 27 Blood Pressure (mmHg): 135/58 Reference Range: 80 - 120 mg / dl Electronic Signature(s) Signed: 03/26/2019 5:59:58 PM By: [name] RN, BSN Entered By: [name] on 03/25/2019 16:35:26

## 2019-04-01 ENCOUNTER — Other Ambulatory Visit: Payer: Self-pay

## 2019-04-01 ENCOUNTER — Encounter (HOSPITAL_BASED_OUTPATIENT_CLINIC_OR_DEPARTMENT_OTHER): Payer: Medicare Other | Admitting: Internal Medicine

## 2019-04-01 DIAGNOSIS — I87332 Chronic venous hypertension (idiopathic) with ulcer and inflammation of left lower extremity: Secondary | ICD-10-CM | POA: Diagnosis not present

## 2019-04-01 NOTE — Progress Notes (Signed)
[name], [name] ([id number]) Visit Report for 04/01/2019 Arrival Information Details Patient Name: Date of Service: [name], [name] 04/01/2019 1:30 PM Medical Record [id number] Patient Account Number: [id number] Date of Birth/Sex: Treating RN: March 03, 1951 (68 y.o. [name] Primary Care [name]: PATIENT, NO Other Clinician: Referring [name]: Treating [name]/Extender:[name], [name], Designated Weeks in Treatment: 18 Visit Information History Since Last Visit Added or deleted any medications: No Patient Arrived: [name] Any new allergies or adverse reactions: No Arrival Time: 13:44 Had a fall or experienced change in No Accompanied By: self activities of daily living that may affect Transfer Assistance: None risk of falls: Patient Identification Verified: Yes Signs or symptoms of abuse/neglect since last No Secondary Verification Process Yes visito Completed: Hospitalized since last visit: No Patient Requires Transmission- No Implantable device outside of the clinic excluding No Based Precautions: cellular tissue based products placed in the center Patient Has Alerts: Yes ABIs 08/2018 L1.05 since last visit: Patient Alerts: Has Dressing in Place as Prescribed: Yes R0.76 Has Compression in Place as Prescribed: Yes Pain Present Now: Yes Electronic Signature(s) Signed: 04/01/2019 2:54:55 PM By: Michael Gouty RN, BSN Entered By: [name] on 04/01/2019 13:50:04 -------------------------------------------------------------------------------- Compression Therapy Details Patient Name: Date of Service: [name] 04/01/2019 1:30 PM Medical Record [id number] Patient Account Number: [id number] Date of Birth/Sex: Treating RN: 17-Apr-1951 (68 y.o. [name] Primary Care [name]: PATIENT, NO Other Clinician: Referring [name]: Treating [name]/Extender:[name], [name], Designated Weeks in Treatment:  18 Compression Therapy Performed for Wound Wound #4 Left,Lateral Malleolus Assessment: Performed By: Clinician Michael Gouty, RN Compression Type: Four Layer Electronic Signature(s) Signed: 04/01/2019 2:54:55 PM By: Michael Gouty RN, BSN Entered By: [name] on 04/01/2019 14:35:05 -------------------------------------------------------------------------------- Encounter Discharge Information Details Patient Name: Date of Service: [name], [name] 04/01/2019 1:30 PM Medical Record [id number] Patient Account Number: [id number] Date of Birth/Sex: Treating RN: 1950/06/21 (69 y.o. [name] Primary Care [name]: PATIENT, NO Other Clinician: Referring [name]: Treating [name]/Extender:[name], [name], Designated Weeks in Treatment: 18 Encounter Discharge Information Items Discharge Condition: Stable Ambulatory Status: Cane Discharge Destination: Home Transportation: Private Auto Accompanied By: self Schedule Follow-up Appointment: Yes Clinical Summary of Care: Patient Declined Electronic Signature(s) Signed: 04/01/2019 2:54:55 PM By: Michael Gouty RN, BSN Entered By: [name] on 04/01/2019 14:37:24 -------------------------------------------------------------------------------- Pain Assessment Details Patient Name: Date of Service: [name], [name] 04/01/2019 1:30 PM Medical Record [id number] Patient Account Number: [id number] Date of Birth/Sex: Treating RN: 1950-08-03 (68 y.o. [name] Primary Care [name]: PATIENT, NO Other Clinician: Referring Nafisa Olds: Treating Babara [name]/Extender:[name], [name], Designated Weeks in Treatment: 18 Active Problems Location of Pain Severity and Description of Pain Patient Has Paino Yes Site Locations Pain Location: Pain in Ulcers With Dressing Change: Yes Duration of the Pain. Constant / Intermittento Constant Rate the pain. Current Pain Level: 5 Worst Pain Level:  6 Least Pain Level: 3 Character of Pain Describe the Pain: Aching, Burning Pain Management and Medication Current Pain Management: Rest: Yes Other: meditation Is the Current Pain Management Adequate: Adequate How does your wound impact your activities of daily livingo Sleep: Yes Bathing: No Appetite: No Relationship With Others: Yes Bladder Continence: No Emotions: Yes Bowel Continence: No Work: No Toileting: No Drive: No Dressing: No Hobbies: Astronomer) Signed: 04/01/2019 2:54:55 PM By: Michael Gouty RN, BSN Entered By: [name] on 04/01/2019 14:01:56 -------------------------------------------------------------------------------- Patient/Caregiver Education Details [name] 10/29/2020andnbsp1:30 Patient Name: Date of Service: F. PM Medical Record Patient Account Number: [id number] Number: Treating RN: [name] Date of  Birth/Gender: [birth year] (68 y.o. M) Other Clinician: Primary Care Physician: PATIENT, NO Treating [name] Referring Physician: Physician/Extender: None, Designated Weeks in Treatment: 69 Education Assessment Education Provided To: Patient Education Topics Provided Venous: Methods: Explain/Verbal Responses: Reinforcements needed, State content correctly Electronic Signature(s) Signed: 04/01/2019 2:54:55 PM By: Michael Gouty RN, BSN Entered By: [name] on 04/01/2019 14:36:59 -------------------------------------------------------------------------------- Wound Assessment Details Patient Name: Date of Service: [name], [name] 04/01/2019 1:30 PM Medical Record [id number] Patient Account Number: [id number] Date of Birth/Sex: Treating RN: 1950/07/29 (68 y.o. [name] Primary Care Camala Talwar: PATIENT, NO Other Clinician: Referring Huck Ashworth: Treating Palin Tristan/Extender:[name], [name], Designated Weeks in Treatment: 18 Wound Status Wound Number: 4 Primary  Venous Leg Ulcer Etiology: Wound Location: Left Malleolus - Lateral Wound Open Wounding Event: Trauma Status: Date Acquired: 11/02/2018 Comorbid Arrhythmia, Coronary Artery Disease, Weeks Of Treatment: 18 History: Type II Diabetes Clustered Wound: No Wound Measurements Length: (cm) 0.9 % Reduct Width: (cm) 1 % Reduct Depth: (cm) 0.5 Epitheli Area: (cm) 0.707 Tunneli Volume: (cm) 0.353 Undermi Wound Description Classification: Full Thickness Without Exposed Support Structures Wound Well defined, not attached Margin: Exudate Small Amount: Exudate Serosanguineous Type: Exudate red, brown Color: Wound Bed Granulation Amount: Small (1-33%) Granulation Quality: Pink, Pale Necrotic Amount: Large (67-100%) Necrotic Quality: Adherent Slough Treatment Notes Wound #4 (Left, Lateral Malleolus) 2. Periwound Care Antifungal cream Moisturizing lotion TCA Cream 3. Primary Dressing Applied Other primary dressing (specifiy in notes) 4. Secondary Dressing Dry Gauze Foam 6. Support Layer Applied 4 layer compression wrap Foul Odor After Cleansing: No Slough/Fibrino Yes Exposed Structure Fascia Exposed: No Fat Layer (Subcutaneous Tissue) Exposed: Yes Tendon Exposed: No Muscle Exposed: No Joint Exposed: No Bone Exposed: No ion in Area: 45.4% ion in Volume: -171.5% alization: Small (1-33%) ng: No ning: No Notes cutimed sorbact. foam applied to anterior lower leg for protection. Electronic Signature(s) Signed: 04/01/2019 2:54:55 PM By: Michael Gouty RN, BSN Entered By: [name] on 04/01/2019 14:34:39 -------------------------------------------------------------------------------- [place] Details Patient Name: Date of Service: [name], [name] 04/01/2019 1:30 PM Medical Record [id number] Patient Account Number: [id number] Date of Birth/Sex: Treating RN: 23-May-1951 (68 y.o. [name] Primary Care Jolie Strohecker: PATIENT, NO Other  Clinician: Referring Tayvin Preslar: Treating Vallerie Hentz/Extender:[name], [name], Designated Weeks in Treatment: 18 Vital Signs Time Taken: 13:50 Temperature (F): 98.0 Height (in): 73 Pulse (bpm): 94 Source: Stated Respiratory Rate (breaths/min): 18 Weight (lbs): 205 Blood Pressure (mmHg): 184/93 Source: Stated Reference Range: 80 - 120 mg / dl Body Mass Index (BMI): 27 Electronic Signature(s) Signed: 04/01/2019 2:54:55 PM By: Michael Gouty RN, BSN Entered By: [name] on 04/01/2019 14:02:38

## 2019-04-05 NOTE — Progress Notes (Signed)
MARKLEY, MUJICA ([id number]) Visit Report for 04/01/2019 SuperBill Details Patient Name: Date of Service: [name], [name] 04/01/2019 Medical Record [id number] Patient Account Number: [id number] Date of Birth/Sex: Treating RN: 06/10/1950 (68 y.o. 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

## 2019-04-08 ENCOUNTER — Encounter (HOSPITAL_BASED_OUTPATIENT_CLINIC_OR_DEPARTMENT_OTHER): Payer: Medicare Other | Admitting: Internal Medicine

## 2019-04-09 ENCOUNTER — Encounter (HOSPITAL_BASED_OUTPATIENT_CLINIC_OR_DEPARTMENT_OTHER): Payer: Medicare Other | Attending: Internal Medicine | Admitting: Internal Medicine

## 2019-04-09 ENCOUNTER — Other Ambulatory Visit: Payer: Self-pay

## 2019-04-09 DIAGNOSIS — I2510 Atherosclerotic heart disease of native coronary artery without angina pectoris: Secondary | ICD-10-CM | POA: Insufficient documentation

## 2019-04-09 DIAGNOSIS — E119 Type 2 diabetes mellitus without complications: Secondary | ICD-10-CM | POA: Insufficient documentation

## 2019-04-09 DIAGNOSIS — I87332 Chronic venous hypertension (idiopathic) with ulcer and inflammation of left lower extremity: Secondary | ICD-10-CM | POA: Diagnosis not present

## 2019-04-09 DIAGNOSIS — L97322 Non-pressure chronic ulcer of left ankle with fat layer exposed: Secondary | ICD-10-CM | POA: Insufficient documentation

## 2019-04-11 NOTE — Progress Notes (Signed)
Michael, [name] ([id number]) Visit Report for 04/09/2019 Cellular or Tissue Based Product Details Patient Name: [name], [name]. Date of Service: 04/09/2019 10:30 AM Medical Record [id number] Patient Account Number: [id number] Date of Birth/Sex: Jul 20, 1950 (68 y.o. M) Treating RN: Primary Care Provider: PATIENT, NO Other Clinician: Referring Provider: Treating Provider/Extender:Tianna Baus, [name], Designated Weeks in Treatment: 20 Cellular or Tissue Based Wound #4 Left,Lateral Malleolus Product Type Applied to: Performed By: Physician Ricard Dillon., MD Cellular or Tissue Based Oasis wound matrix Product Type: Level of Consciousness (Pre- Awake and Alert procedure): Pre-procedure Yes - 11:30 Verification/Time Out Taken: Location: trunk / arms / legs Wound Size (sq cm): 0.56 Product Size (sq cm): 10 Waste Size (sq cm): 5 Waste Reason: wound size Amount of Product Applied (sq cm): 5 Instrument Used: Forceps, Scissors Lot #: [id number] Order #: 1 Expiration Date: 09/22/2019 Fenestrated: No Reconstituted: Yes Solution Type: saline Solution Amount: 16mL Lot #: [id number] Expiration Date: 09/30/2020 Secured: Yes Secured With: Steri-Strips Dressing Applied: Yes Primary Dressing: adaptic, drawtex Procedural Pain: 0 Post Procedural Pain: 0 Response to Treatment: Procedure was tolerated well Level of Consciousness Awake and Alert (Post-procedure): Post Procedure Diagnosis Same as Pre-procedure Electronic Signature(s) Signed: 04/11/2019 8:54:13 AM By: Linton Ham MD Entered By: Linton Ham on 04/09/2019 12:48:22 -------------------------------------------------------------------------------- HPI Details Patient Name: Date of Service: Michael Post F. 04/09/2019 10:30 AM Medical Record [id number] Patient Account Number: [id number] Date of Birth/Sex: Treating RN: [birth year] (68 y.o. M) Primary Care Provider: PATIENT, NO Other  Clinician: Referring Provider: Treating Provider/Extender:Melika Reder, [name], Designated Weeks in Treatment: 20 History of Present Illness HPI Description: 08/22/17-He is here for initial evaluation of a right heel wound, right toe wound and left heel fissures. He states that he has chronic dry, flaking skin and frequently has cracked heels. He states the right heel wound developed as such but he developed the wound approximately 3 weeks ago when pulling some skin off; he was unaware of the right toe ulcer. He was treating the right heel with over-the-counter triple antibiotic ointment and peroxide. He presented to urgent care on 3/12 with a 2 x 2 centimeter ulceration to the right heel and was discharged with a 10 day prescription for Bactrim. He does have a history of neuropathy, diet controlled diabetic. His does not know his most recent A1c but will be seeing his PCP next week, there is no evidence of an A1c in Epic. He denies smoking, does have a history of alcohol use, he admits to 5+/- beers weekly not daily. He did follow-up with cardiology on 3/18 with c/o ble edema, they ordered DVT study which he completed on 3/21. He reports that the bilateral DVT study was negative per the ultrasound technician, no report is available at this time. READMISSION 11/20/2018 this is a now 68 year old man who is here for review of wounds on his lower extremity on the left. We note that he was here on a single visit in March he tells [place] that over the last 2019. His major issue is that he dropped a box and hit the outside of his left lateral malleolus about 3 weeks ago. This is left him with a nonhealing area. He has been using Iodosorb ointment he had apparently from the last stay in this clinic. Also of note over the last 6 weeks he has noted an area that is raised and will scab over but then he traumatizes a scab and then this bleeds and reopens. This is on the left posterior calf. All  of this appears  to be on the background of chronic venous insufficiency. He has had DVT studies in the past that were negative in 2019. He is also had arterial studies that showed an ABI on the right of 0.76 and on the left of 1.05. Past medical history; the patient states that he is not a diabetic although I note there is some suggested he was in the note from last year. He has a history of coronary artery disease. He tells me that he has had a history of skin cancer but he is not sure which one but that includes one on the left upper thigh. ABIs done previously 0.76 on the right and 1.05 on the left 6/26; patient readmitted to the clinic last week. He had a traumatic area over the left lateral malleolus as well as a hyper granulated growth on the left posterior calf. He also has severe chronic venous insufficiency. 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 We have been using Iodoflex to the traumatic wound under compression. 7/9; area over the left lateral malleolus continues to require debridement and we are continuing with Iodoflex under compression. He has severe chronic venous insufficiency. 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 This patient has severe bilateral venous insufficiency and venous hypertension with skin changes resulting. He requires bilateral reflux studies Finally he traumatized his right lateral calf while doing yard work about a week ago. The area is small with a flap of skin over the majority of the wound but I am not sure this is going to remain viable 7/16; still not a viable surface on the left lateral malleolus in fact this wound is deeper. We have been using Iodoflex. 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 He has severe bilateral venous insufficiency and I have ordered reflux studies these apparently are booked for next week. Our intake nurse noted some yellowish-green drainage coming from the wound on the left ankle. Patient was insistent on talking about doing carotid ultrasounds and he was hoping to get these done with his reflux studies next week. I spent a few minutes talking to him about this I just could not put together in a logical way what he is concerned about. At the end of this I asked him to see his primary doctor about this he says he does not have a primary doctor advised him to get a primary doctor to go over this with him. It was not really clear where the level of concern was specific to carotid ultrasound 7/23; a better looking surface on the left lateral malleolus although it still requires debridement. He tells me he has an appointment to Mohs surgery center next week. Small area on the right lateral calf. The patient went to have his [facility] studies [facility] I do not think they took his compression wraps off. He did have reflux on the left in the common femoral greater saphenous and saphenofemoral junction and the great saphenous vein in the proximal thigh. 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 Looking at the numbers it would appear that the maximal reflux was in the great saphenous vein in the proximal thigh. As usual I am not sure what would be possible to do here. I will send him to vascular surgery 7/30; the patient still requires debridement in the left lateral malleolus although in general the wound is still deep and and punched out. We are using Sorbact. The area on the right lateral calf is improved. 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 The patient does not have a vascular surgery  appointment nor does he have a skin surgery center appointment. I am really not sure what the issues are here although I know vascular surgery as well behind. 8/6-Patient does not have a vascular appointment nor does he have a skin surgery appointment firmed up at this time although he says he is called them both, impressed upon him that he should see whoever is available for the vascular so the vein studies can be interpreted. 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 He is here for the left lateral malleoli wound for which we are using SOrbact, and he is disappointed the wound is not healing 8/21; patient saw Dr. Doren Custard on 01/21/2019. Dr. Doren Custard was concerned about his arterial flow. Noting that he had trouble feeling the dorsalis pedis and posterior tibial arteries also noteworthy that the Doppler had a barely biphasic posterior tibial signal with a monophasic but fairly brisk dorsalis pedis signal. He is booked for an angiogram next Friday. We have been using 3 layer compression on him. 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 Given Dr. Mee Hives concern it was difficult for me to be comfortable with him going ahead with the cancer extraction and I have suggested canceling this. Finally he has an increase in the erythematous contact dermatitis in the entire wrapped area of the left leg which is probably an allergy to the contact layer of the 3 layer. 8/27; he is going for his angiogram tomorrow by Dr. Doren Custard.  We cancel the Mohs surgery on the tumor on his posterior calf pending the angiogram tomorrow. If this shows adequate blood flow to this area I think we can rebook this. His main wound is on the left lateral malleolus a small refractory punched-out area. We have been using Hydrofera Blue 9/3; his angiogram was done as scheduled. He does indeed have PAD. The common femoral, deep femoral superficial femoral and popliteal arteries will are all widely patent. 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 The overall thought was that he had enough adequate circulation to heal his venous ulcer and continue to use mild elevation and compression. Noted that he had biphasic posterior tibial signals with a Doppler ABI of 0.91. It was not felt that the  stenosis in the proximal tibial artery would be associated with increased blood flow he will follow-up with Dr. Doren Custard in 1 month He now needs to get the skin surgery appointment set up. The area is growing larger and bleeding frequently on the posterior left calf per the patient 02/11/19-Patient is back after 1 week, results of his vascular studies are noted, patient needs to have a skin surgery appointment established apparently he did not return the calls this week. The area on the posterior calf is where the skin surgery needs to be done, the left lateral malleoli are wound appears to be about the same we will using Hydrofera Blue change 9/24; patient finally has a booking to deal with the cancer on his posterior calf I believe with the skin surgery center. Not much change in the wound over the left lateral malleolus. It is been 2 weeks since he was here. I changed him to endoform last time 10/2; the patient has been to see dermatology. Scheduled for surgery on 10/14.  Using endoform to the wound over the lateral malleolus some improvement in the wound condition but not the surface area. He has severe chronic venous insufficiency. He also has some arterial disease he was angiogram by Dr. Doren Custard. He did not think that anything needed to be done from an intervention point of view. 10/8; his surgery is still scheduled for 10/14. It is concerning about whether he is healed or maintained skin integrity and is sutured area given the severity of his chronic venous insufficiency. The area we are following is on the left lateral malleolus. Some improvement in the surface area. No debridement today we have been using endoform 10/15; the patient finally had his surgery for the tumor on his left posterior calf. We did not look at this today. Still a punched out area in the lower left malleolus. Perhaps some mild improvement we have been using endoform We were denied for Grafix and still have not heard back on Oasis 10/22; no real change in this wound. I am able to debride it to a healthy looking surface but the next week there is recurrent debris and no improvement in depth. Punched out over the left lateral malleolus. We have not heard back on Oasis. We were denied for Grafix 11/6; wound is perhaps slightly smaller. Still punched out. He was approved for Oasis we applied Oasis #1. His surgical wound on the back of the left calf was reviewed by dermatology we are not reviewing this Electronic Signature(s) Signed: 04/11/2019 8:54:13 AM By: Linton Ham MD Entered By: Linton Ham on 04/09/2019 12:51:51 -------------------------------------------------------------------------------- Physical Exam Details Patient Name: Date of Service: DEVERY, OBERHELMAN 04/09/2019 10:30 AM Medical Record [id number] Patient Account Number: [id number] Date of Birth/Sex: Treating RN: Aug 31, 1950 (68 y.o. M) Primary Care Provider: PATIENT, NO Other Clinician: Referring Provider:  Treating Provider/Extender:Wyn Nettle, [name], Designated Weeks in Treatment: 20 Constitutional Patient is hypertensive.. Pulse regular and within target range for patient.Marland Kitchen Respirations regular, non-labored and within target range.. Temperature is normal and within the target range for the patient.Marland Kitchen Appears in no distress. Notes Wound exam; the area on the left lateral malleolus. There is still debris over the surface of this wound although it seems to come off fairly easily. We applied Oasis #1 in the standard fashion Electronic Signature(s) Signed: 04/11/2019 8:54:13 AM By: Linton Ham MD Entered By: Linton Ham on 04/09/2019 12:53:48 -------------------------------------------------------------------------------- Physician Orders Details Patient Name: Date of Service: [name]. 04/09/2019 10:30 AM Medical Record [id number]  Patient Account Number: [id number] Date of Birth/Sex: Treating RN: July 23, 1950 (68 y.o. [name] Primary Care Provider: PATIENT, NO Other Clinician: Referring Provider: Treating Provider/Extender:Aerielle Stoklosa, [name], Designated Weeks in Treatment: 20 Verbal / Phone Orders: No Diagnosis Coding ICD-10 Coding Code Description I87.332 Chronic venous hypertension (idiopathic) with ulcer and inflammation of left lower extremity L97.321 Non-pressure chronic ulcer of left ankle limited to breakdown of skin C44.799 Other specified malignant neoplasm of skin of left lower limb, including hip Follow-up Appointments Return Appointment in 1 week. Dressing Change Frequency Wound #4 Left,Lateral Malleolus Do not change entire dressing for one week. Skin Barriers/Peri-Wound Care TCA Cream or Ointment - liberal to lower leg Wound Cleansing Wound #4 Left,Lateral Malleolus May shower with protection. Primary Wound Dressing Wound #4 Left,Lateral Malleolus Skin Substitute Application - Oasis Burn Matrix #1 Secondary Dressing Wound #4  Left,Lateral Malleolus Adaptic Dressing Drawtex Other: - pad the left lateral and anterior portion of lower leg. Edema Control 4 layer compression: Left lower extremity - no kerlix no cotton. pad with foam to anterior portion of lower leg. Avoid standing for long periods of time Elevate legs to the level of the heart or above for 30 minutes daily and/or when sitting, a frequency of: - throughout the day Exercise regularly Electronic Signature(s) Signed: 04/09/2019 6:04:56 PM By: [name] Signed: 04/11/2019 8:54:13 AM By: Linton Ham MD Entered By: [name] on 04/09/2019 11:35:38 -------------------------------------------------------------------------------- Problem List Details Patient Name: Date of Service: Michael Post F. 04/09/2019 10:30 AM Medical Record [id number] Patient Account Number: [id number] Date of Birth/Sex: Treating RN: 08-12-1950 (68 y.o. [name] Primary Care Provider: PATIENT, NO Other Clinician: Referring Provider: Treating Provider/Extender:Janesa Dockery, [name], Designated Weeks in Treatment: 20 Active Problems ICD-10 Evaluated Encounter Code Description Active Date Today Diagnosis I87.332 Chronic venous hypertension (idiopathic) with ulcer 11/20/2018 No Yes and inflammation of left lower extremity L97.321 Non-pressure chronic ulcer of left ankle limited to 11/20/2018 No Yes breakdown of skin C44.799 Other specified malignant neoplasm of skin of left 11/27/2018 No Yes lower limb, including hip Inactive Problems ICD-10 Code Description Active Date Inactive Date L97.211 Non-pressure chronic ulcer of right calf limited to breakdown of 12/10/2018 12/10/2018 skin Resolved Problems Electronic Signature(s) Signed: 04/11/2019 8:54:13 AM By: Linton Ham MD Entered By: Linton Ham on 04/09/2019 12:47:40 -------------------------------------------------------------------------------- Progress Note Details Patient  Name: Date of Service: Michael Post F. 04/09/2019 10:30 AM Medical Record [id number] Patient Account Number: [id number] Date of Birth/Sex: Treating RN: [birth year] (68 y.o. M) Primary Care Provider: Other Clinician: PATIENT, NO Referring Provider: Treating Provider/Extender:Silvia Hightower, [name], Designated Weeks in Treatment: 20 Subjective History of Present Illness (HPI) 08/22/17-He is here for initial evaluation of a right heel wound, right toe wound and left heel fissures. He states that he has chronic dry, flaking skin and frequently has cracked heels. He states the right heel wound developed as such but he developed the wound approximately 3 weeks ago when pulling some skin off; he was unaware of the right toe ulcer. He was treating the right heel with over-the-counter triple antibiotic ointment and peroxide. He presented to urgent care on 3/12 with a 2 x 2 centimeter ulceration to the right heel and was discharged with a 10 day prescription for Bactrim. He does have a history of neuropathy, diet controlled diabetic. His does not know his most recent A1c but will be seeing his PCP next week, there is no evidence of an A1c in Epic. He denies smoking, does have a history of alcohol use, he admits  to 5+/- beers weekly not daily. He did follow-up with cardiology on 3/18 with c/o ble edema, they ordered DVT study which he completed on 3/21. He reports that the bilateral DVT study was negative per the ultrasound technician, no report is available at this time. READMISSION 11/20/2018 this is a now 68 year old man who is here for review of wounds on his lower extremity on the left. We note that he was here on a single visit in March he tells [place] that over the last 2019. His major issue is that he dropped a box and hit the outside of his left lateral malleolus about 3 weeks ago. This is left him with a nonhealing area. He has been using Iodosorb ointment he had apparently from the last  stay in this clinic. Also of note over the last 6 weeks he has noted an area that is raised and will scab over but then he traumatizes a scab and then this bleeds and reopens. This is on the left posterior calf. All of this appears to be on the background of chronic venous insufficiency. He has had DVT studies in the past that were negative in 2019. He is also had arterial studies that showed an ABI on the right of 0.76 and on the left of 1.05. Past medical history; the patient states that he is not a diabetic although I note there is some suggested he was in the note from last year. He has a history of coronary artery disease. He tells me that he has had a history of skin cancer but he is not sure which one but that includes one on the left upper thigh. ABIs done previously 0.76 on the right and 1.05 on the left 6/26; patient readmitted to the clinic last week. He had a traumatic area over the left lateral malleolus as well as a hyper granulated growth on the left posterior calf. He also has severe chronic venous insufficiency. 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 We have been using Iodoflex to the traumatic wound under compression. 7/9; area over the left lateral malleolus continues to require debridement and we are continuing with Iodoflex under compression. He has severe chronic venous insufficiency. 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 This patient has severe bilateral venous insufficiency and venous hypertension with skin changes resulting. He requires bilateral reflux studies Finally he traumatized his right lateral calf while doing yard work about a week ago. The area is small with a flap of skin over the majority of the wound but I am not sure this is going to remain viable 7/16; still not a viable  surface on the left lateral malleolus in fact this wound is deeper. We have been using Iodoflex. 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 He has severe bilateral venous insufficiency and I have ordered reflux studies these apparently are booked for next week. Our intake nurse noted some yellowish-green drainage coming from the wound on the left ankle. Patient was insistent on talking about doing carotid ultrasounds and he was hoping to get these done with his reflux studies next week. I spent a few minutes talking to him about this I  just could not put together in a logical way what he is concerned about. At the end of this I asked him to see his primary doctor about this he says he does not have a primary doctor advised him to get a primary doctor to go over this with him. It was not really clear where the level of concern was specific to carotid ultrasound 7/23; a better looking surface on the left lateral malleolus although it still requires debridement. He tells me he has an appointment to [facility] surgery center next week. Small area on the right lateral calf. The patient went to have his [facility] I do not think they took his compression wraps off. He did have reflux on the left in the common femoral greater saphenous and saphenofemoral junction and the great saphenous vein in the proximal thigh. 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 Looking at the numbers it would appear that the maximal reflux was in the great saphenous vein in the proximal thigh. As usual I am not sure what would be possible to do here. I will send him to vascular surgery 7/30; the patient still requires debridement in the left lateral malleolus although  in general the wound is still deep and and punched out. We are using Sorbact. The area on the right lateral calf is improved. 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 The patient does not have a vascular surgery appointment nor does he have a skin surgery center appointment. I am really not sure what the issues are here although I know vascular surgery as well behind. 8/6-Patient does not have a vascular appointment nor does he have a skin surgery appointment firmed up at this time although he says he is called them both, impressed upon him that he should see whoever is available for the vascular so the vein studies can be interpreted. 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 He is here for the left lateral malleoli wound for which we are using SOrbact, and he is disappointed the wound is not healing 8/21; patient saw Dr. Doren Custard on 01/21/2019. Dr. Doren Custard was concerned about his arterial flow. Noting that he had trouble feeling the dorsalis pedis and posterior tibial arteries also noteworthy that the Doppler had a barely biphasic posterior tibial signal with a monophasic but fairly brisk dorsalis pedis signal. He is booked for an angiogram next Friday. We have been using 3 layer compression on him. 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 Given Dr. Mee Hives concern it was difficult for me to be comfortable with him going ahead with the cancer extraction and I have suggested canceling this.  Finally he has an increase in the erythematous contact dermatitis in the entire wrapped area of the left leg which is probably an allergy to the contact layer of the 3  layer. 8/27; he is going for his angiogram tomorrow by Dr. Doren Custard. We cancel the Mohs surgery on the tumor on his posterior calf pending the angiogram tomorrow. If this shows adequate blood flow to this area I think we can rebook this. His main wound is on the left lateral malleolus a small refractory punched-out area. We have been using Hydrofera Blue 9/3; his angiogram was done as scheduled. He does indeed have PAD. The common femoral, deep femoral superficial femoral and popliteal arteries will are all widely patent. 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 The overall thought was that he had enough adequate circulation to heal his venous ulcer and continue to use mild elevation and compression. Noted that he had biphasic posterior tibial signals with a Doppler ABI of 0.91. It was not felt that the stenosis in the proximal tibial artery would be associated with increased blood flow he will follow-up with Dr. Doren Custard in 1 month He now needs to get the skin surgery appointment set up. The area is growing larger and bleeding frequently on the posterior left calf per the patient 02/11/19-Patient is back after 1 week, results of his vascular studies are noted, patient needs to have a skin surgery appointment established apparently he did not return the calls this week. The area on the posterior calf is where the skin surgery needs to be done, the left lateral malleoli are wound appears to be about the same we will using Hydrofera Blue change 9/24; patient finally has a booking to deal with the cancer on his posterior calf I believe with the skin  surgery center. Not much change in the wound over the left lateral malleolus. It is been 2 weeks since he was here. I changed him to endoform last time 10/2; the patient has been to see dermatology. Scheduled for surgery on 10/14. Using endoform to the wound over the lateral malleolus some improvement in the wound condition but not the surface area. He has severe chronic venous insufficiency. He also has some arterial disease he was angiogram by Dr. Doren Custard. He did not think that anything needed to be done from an intervention point of view. 10/8; his surgery is still scheduled for 10/14. It is concerning about whether he is healed or maintained skin integrity and is sutured area given the severity of his chronic venous insufficiency. The area we are following is on the left lateral malleolus. Some improvement in the surface area. No debridement today we have been using endoform 10/15; the patient finally had his surgery for the tumor on his left posterior calf. We did not look at this today. Still a punched out area in the lower left malleolus. Perhaps some mild improvement we have been using endoform We were denied for Grafix and still have not heard back on Oasis 10/22; no real change in this wound. I am able to debride it to a healthy looking surface but the next week there is recurrent debris and no improvement in depth. Punched out over the left lateral malleolus. We have not heard back on Oasis. We were denied for Grafix 11/6; wound is perhaps slightly smaller. Still punched out. He was approved for Oasis we applied Oasis #1. His surgical wound on the back of the left calf was reviewed by dermatology we are not  reviewing this Objective Constitutional Patient is hypertensive.. Pulse regular and within target range for patient.Marland Kitchen Respirations regular, non-labored and within target range.. Temperature is normal and within the target range for the patient.Marland Kitchen Appears in no distress. Vitals Time  Taken: 10:58 AM, Height: 73 in, Weight: 205 lbs, BMI: 27, Temperature: 98.7 F, Pulse: 84 bpm, Respiratory Rate: 18 breaths/min, Blood Pressure: 196/94 mmHg. General Notes: Wound exam; the area on the left lateral malleolus. There is still debris over the surface of this wound although it seems to come off fairly easily. We applied Oasis #1 in the standard fashion Integumentary (Hair, Skin) Wound #4 status is Open. Original cause of wound was Trauma. The wound is located on the Left,Lateral Malleolus. The wound measures 0.7cm length x 0.8cm width x 0.3cm depth; 0.44cm^2 area and 0.132cm^3 volume. There is Fat Layer (Subcutaneous Tissue) Exposed exposed. There is no tunneling or undermining noted. There is a small amount of serosanguineous drainage noted. The wound margin is well defined and not attached to the wound base. There is medium (34-66%) pink, pale granulation within the wound bed. There is a medium (34-66%) amount of necrotic tissue within the wound bed including Adherent Slough. 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 A skin graft procedure using a bioengineered skin substitute/cellular or tissue based product was performed by Ricard Dillon., MD with the following instrument(s): Forceps and Scissors. Oasis wound matrix was applied and secured with Steri-Strips. 5 sq cm of product was utilized and 5 sq cm was wasted due to wound size. Post Application, adaptic, drawtex was applied. A Time Out was conducted at 11:30, prior to the start of the procedure. The procedure was tolerated well with a pain level of 0 throughout and a pain  level of 0 following the procedure. Post procedure Diagnosis Wound #4: Same as Pre-Procedure . Pre-procedure diagnosis of Wound #4 is a Venous Leg Ulcer located on the Left,Lateral Malleolus . There was a Four Layer Compression Therapy Procedure by [name], RN. Post procedure Diagnosis Wound #4: Same as Pre-Procedure Plan Follow-up Appointments: Return Appointment in 1 week. Dressing Change Frequency: Wound #4 Left,Lateral Malleolus: Do not change entire dressing for one week. Skin Barriers/Peri-Wound Care: TCA Cream or Ointment - liberal to lower leg Wound Cleansing: Wound #4 Left,Lateral Malleolus: May shower with protection. Primary Wound Dressing: Wound #4 Left,Lateral Malleolus: Skin Substitute Application - Oasis Burn Matrix #1 Secondary Dressing: Wound #4 Left,Lateral Malleolus: Adaptic Dressing Drawtex Other: - pad the left lateral and anterior portion of lower leg. Edema Control: 4 layer compression: Left lower extremity - no kerlix no cotton. pad with foam to anterior portion of lower leg. Avoid standing for long periods of time Elevate legs to the level of the heart or above for 30 minutes daily and/or when sitting, a frequency of: - throughout the day Exercise regularly 1. Oasis #1 applied in the standard fashion. 2. Not much change although I am able to get to a very healthy looking surface Electronic Signature(s) Signed: 04/11/2019 8:54:13 AM By: Linton Ham MD Entered By: Linton Ham on 04/09/2019 12:54:26 -------------------------------------------------------------------------------- SuperBill Details Patient Name: Date of Service: JJUAN, MASTROGIOVANNI 04/09/2019 Medical Record [id number] Patient Account Number: [id number] Date of Birth/Sex:  Treating RN: [birth year] (68 y.o. 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, including hip Facility Procedures CPT4: Code [id number] Q4 Description: 103 - Dermal substitute tissue/non-human origin w metabolically active elements- Oasis (Burn Matrix)product applied per sq cm (Facility Only) Modifier Quantity: 10 CPT4: [id number] Description: 271 - SKIN SUB GRAFT TRNK/ARM/LEG ICD-10 Diagnosis Description L97.321 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

## 2019-04-12 ENCOUNTER — Ambulatory Visit (HOSPITAL_BASED_OUTPATIENT_CLINIC_OR_DEPARTMENT_OTHER): Payer: Medicare Other | Admitting: Internal Medicine

## 2019-04-12 ENCOUNTER — Encounter (HOSPITAL_BASED_OUTPATIENT_CLINIC_OR_DEPARTMENT_OTHER): Payer: Medicare Other | Admitting: Internal Medicine

## 2019-04-15 ENCOUNTER — Ambulatory Visit (HOSPITAL_BASED_OUTPATIENT_CLINIC_OR_DEPARTMENT_OTHER): Payer: Medicare Other | Admitting: Internal Medicine

## 2019-04-16 ENCOUNTER — Other Ambulatory Visit: Payer: Self-pay

## 2019-04-16 ENCOUNTER — Encounter (HOSPITAL_BASED_OUTPATIENT_CLINIC_OR_DEPARTMENT_OTHER): Payer: Medicare Other | Admitting: Internal Medicine

## 2019-04-16 DIAGNOSIS — I87332 Chronic venous hypertension (idiopathic) with ulcer and inflammation of left lower extremity: Secondary | ICD-10-CM | POA: Diagnosis not present

## 2019-04-16 NOTE — Progress Notes (Signed)
[name] ([id number]) Visit Report for 04/16/2019 Cellular or Tissue Based Product Details Patient Name: [name], [name]. Date of Service: 04/16/2019 9:30 AM Medical Record [id number] Patient Account Number: [id number] Date of Birth/Sex: 1951/04/27 (68 y.o. M) Treating RN: Baruch Gouty Primary Care Provider: PATIENT, NO Other Clinician: Referring Provider: Treating Provider/Extender:Brita Jurgensen, [name], Designated Weeks in Treatment: 21 Cellular or Tissue Based Wound #4 Left,Lateral Malleolus Product Type Applied to: Performed By: Physician Ricard Dillon., MD Cellular or Tissue Based Oasis wound matrix Product Type: Level of Consciousness (Pre- Awake and Alert procedure): Pre-procedure Yes - 10:29 Verification/Time Out Taken: Location: trunk / arms / legs Wound Size (sq cm): 0.49 Product Size (sq cm): 10 Waste Size (sq cm): 5 Waste Reason: size of wound Amount of Product Applied (sq cm): 5 Instrument Used: Forceps, Scissors Lot #: [id number] Order #: 2 Expiration Date: 06/05/2020 Fenestrated: No Reconstituted: Yes Solution Type: normal saline Solution Amount: 47mL Lot #[id number] Solution Expiration Date: 09/30/2020 Secured: Yes Secured With: Steri-Strips Dressing Applied: Yes Primary Dressing: drawtex Response to Treatment: Procedure was tolerated well Level of Consciousness Awake and Alert (Post-procedure): Post Procedure Diagnosis Same as Pre-procedure Notes oasis burn [employer] education [employer]) Signed: 04/16/2019 5:20:07 PM By: Baruch Gouty RN, BSN Signed: 04/16/2019 5:33:43 PM By: Linton Ham MD Entered By: Baruch Gouty on 04/16/2019 13:57:20 -------------------------------------------------------------------------------- HPI Details Patient Name: Date of Service: [name]. 04/16/2019 9:30 AM Medical Record [id number] Patient Account Number: [id number] Date of Birth/Sex: Treating RN: January 31, 1951 (68 y.o. M  [name] Primary Care Provider: PATIENT, NO Other Clinician: Referring Provider: Treating Provider/Extender:Shrika Milos, [name], Designated Weeks in Treatment: 21 History of Present Illness HPI Description: 08/22/17-He is here for initial evaluation of a right heel wound, right toe wound and left heel fissures. He states that he has chronic dry, flaking skin and frequently has cracked heels. He states the right heel wound developed as such but he developed the wound approximately 3 weeks ago when pulling some skin off; he was unaware of the right toe ulcer. He was treating the right heel with over-the-counter triple antibiotic ointment and peroxide. He presented to urgent care on 3/12 with a 2 x 2 centimeter ulceration to the right heel and was discharged with a 10 day prescription for Bactrim. He does have a history of neuropathy, diet controlled diabetic. His does not know his most recent A1c but will be seeing his PCP next week, there is no evidence of an A1c in Epic. He denies smoking, does have a history of alcohol use, he admits to 5+/- beers weekly not daily. He did follow-up with cardiology on 3/18 with c/o ble edema, they ordered DVT study which he completed on 3/21. He reports that the bilateral DVT study was negative per the ultrasound technician, no report is available at this time. READMISSION 11/20/2018 this is a now 68 year old man who is here for review of wounds on his lower extremity on the left. We note that he was here on a single visit in March he tells [place] that over the last 2019. His major issue is that he dropped a box and hit the outside of his left lateral malleolus about 3 weeks ago. This is left him with a nonhealing area. He has been using Iodosorb ointment he had apparently from the last stay in this clinic. Also of note over the last 6 weeks he has noted an area that is raised and will scab over but then he traumatizes a scab and then this  bleeds  and reopens. This is on the left posterior calf. All of this appears to be on the background of chronic venous insufficiency. He has had DVT studies in the past that were negative in 2019. He is also had arterial studies that showed an ABI on the right of 0.76 and on the left of 1.05. Past medical history; the patient states that he is not a diabetic although I note there is some suggested he was in the note from last year. He has a history of coronary artery disease. He tells me that he has had a history of skin cancer but he is not sure which one but that includes one on the left upper thigh. ABIs done previously 0.76 on the right and 1.05 on the left 6/26; patient readmitted to the clinic last week. He had a traumatic area over the left lateral malleolus as well as a hyper granulated growth on the left posterior calf. He also has severe chronic venous insufficiency. 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 We have been using Iodoflex to the traumatic wound under compression. 7/9; area over the left lateral malleolus continues to require debridement and we are continuing with Iodoflex under compression. He has severe chronic venous insufficiency. 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 This patient has severe bilateral venous insufficiency and venous hypertension with skin changes resulting. He requires bilateral reflux studies Finally he traumatized his right lateral calf while doing yard work about a week ago. The area is small with a flap of skin over the majority of the wound but I am not sure this is going to remain viable 7/16; still not a viable surface on the left lateral malleolus in fact this wound is deeper. We have been using Iodoflex. 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 He has severe bilateral venous insufficiency and I have ordered reflux studies these apparently are booked for next week. Our intake nurse noted some yellowish-green drainage coming from the wound on the left ankle. Patient was insistent on talking about doing carotid ultrasounds and he was hoping to get these done with his reflux studies next week. I spent a few minutes talking to him about this I just could not put together in a logical way what he is concerned about. At the end of this I asked him to see his primary doctor about this he says he does not have a primary doctor advised him to get a primary doctor to go over this with him. It was not really clear where the level of concern was specific to carotid ultrasound 7/23; a better looking surface on the left lateral malleolus although it still requires debridement. He tells me he has an appointment to Mohs surgery center next week. Small area on the right lateral calf. The patient went to have his [facility] I do not think they took his compression wraps off. He did have reflux on the left in the common femoral greater saphenous and saphenofemoral junction and the great saphenous vein in the proximal thigh. 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 Looking at the numbers it would appear that the maximal reflux was in the great saphenous vein in the proximal thigh. As usual I am not sure what would be possible to do here. I will send him to vascular surgery 7/30; the patient still requires debridement in the left lateral malleolus although in general the wound is still deep and and punched out. We are using Sorbact. The area on the right lateral calf is improved. He still has hyper granulated tumor  on the left posterior calf. The patient does not have a vascular surgery appointment nor does he have a skin surgery center appointment. I am really not sure what the issues are here although I know vascular surgery as well behind. 8/6-Patient does not have a vascular appointment nor does he have a skin surgery appointment firmed up at this time although he says he is called them both, impressed upon him that he should see whoever is available for the vascular so the vein studies can be interpreted. 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 He is here for the left lateral malleoli wound for which we are using SOrbact, and he is disappointed the wound is not healing 8/21; patient saw Dr. Doren Custard on 01/21/2019. Dr. Doren Custard was concerned about his arterial flow. Noting that he had trouble feeling the dorsalis pedis and posterior tibial arteries also noteworthy that the Doppler had a barely biphasic posterior tibial signal with a monophasic but fairly brisk dorsalis pedis signal. He is booked for an angiogram next Friday. We have been using 3 layer compression on him. 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 Given Dr. Mee Hives concern it was difficult for me to be comfortable with him going ahead with the cancer extraction and I have suggested canceling this. Finally he has an increase in the erythematous contact dermatitis in the entire wrapped area of the left leg which is probably an allergy to the contact layer of  the 3 layer. 8/27; he is going for his angiogram tomorrow by Dr. Doren Custard. We cancel the Mohs surgery on the tumor on his posterior calf pending the angiogram tomorrow. If this shows adequate blood flow to this area I think we can rebook this. His main wound is on the left lateral malleolus a small refractory punched-out area. We have been using Hydrofera Blue 9/3; his angiogram was done as scheduled. He does indeed have PAD. The common femoral, deep femoral superficial femoral and popliteal arteries will are all widely patent. 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 The overall thought was that he had enough adequate circulation to heal his venous ulcer and continue to use mild elevation and compression. Noted that he had biphasic posterior tibial signals with  a Doppler ABI of 0.91. It was not felt that the stenosis in the proximal tibial artery would be associated with increased blood flow he will follow-up with Dr. Doren Custard in 1 month He now needs to get the skin surgery appointment set up. The area is growing larger and bleeding frequently on the posterior left calf per the patient 02/11/19-Patient is back after 1 week, results of his vascular studies are noted, patient needs to have a skin surgery appointment established apparently he did not return the calls this week. The area on the posterior calf is where the skin surgery needs to be done, the left lateral malleoli are wound appears to be about the same we will using Hydrofera Blue change 9/24; patient finally has a booking to deal with the cancer on his posterior calf I believe with the skin surgery center. Not much change in the wound over the left lateral malleolus. It is been 2 weeks since he was here. I changed him to endoform last time 10/2; the  patient has been to see dermatology. Scheduled for surgery on 10/14. Using endoform to the wound over the lateral malleolus some improvement in the wound condition but not the surface area. He has severe chronic venous insufficiency. He also has some arterial disease he was angiogram by Dr. Doren Custard. He did not think that anything needed to be done from an intervention point of view. 10/8; his surgery is still scheduled for 10/14. It is concerning about whether he is healed or maintained skin integrity and is sutured area given the severity of his chronic venous insufficiency. The area we are following is on the left lateral malleolus. Some improvement in the surface area. No debridement today we have been using endoform 10/15; the patient finally had his surgery for the tumor on his left posterior calf. We did not look at this today. Still a punched out area in the lower left malleolus. Perhaps some mild improvement we have been using endoform We were denied for Grafix and still have not heard back on Oasis 10/22; no real change in this wound. I am able to debride it to a healthy looking surface but the next week there is recurrent debris and no improvement in depth. Punched out over the left lateral malleolus. We have not heard back on Oasis. We were denied for Grafix 11/6; wound is perhaps slightly smaller. Still punched out. He was approved for Oasis we applied Oasis #1. His surgical wound on the back of the left calf was reviewed by dermatology we are not reviewing this 11/13; I think the wound is filled in somewhat. Certainly a much more vibrant looking surface. Oasis #2. I looked at his surgical site on the back of the calf today for the first time. Clean incision small open areas Electronic Signature(s) Signed: 04/16/2019 5:33:43 PM By: Linton Ham MD Entered By: Linton Ham on 04/16/2019  10:43:09 -------------------------------------------------------------------------------- Physical Exam Details Patient Name: Date of Service: TOURE, HEETER 04/16/2019 9:30 AM Medical Record [id number] Patient Account Number: [id number] Date of Birth/Sex: Treating RN: [birth year] (68 y.o. [name] Primary Care Provider: PATIENT, NO Other Clinician: Referring Provider: Treating Provider/Extender:Yanett Conkright, [name], Designated Weeks in Treatment: 21 Constitutional Pulse regular and within target range for patient.Marland Kitchen Respirations regular, non-labored and within target range.. Temperature is normal and within the target range for the patient.Marland Kitchen Respiratory work of breathing is normal. Notes Wound exam; the area is on the left lateral malleolus. Better looking surface. Slough washed out with Anasept  and gauze. I think this is filled in somewhat. There is no surrounding infection. Electronic Signature(s) Signed: 04/16/2019 5:33:43 PM By: Linton Ham MD Entered By: Linton Ham on 04/16/2019 10:44:50 -------------------------------------------------------------------------------- Physician Orders Details Patient Name: Date of Service: ADEMIR, BERENGUER 04/16/2019 9:30 AM Medical Record [id number] Patient Account Number: [id number] Date of Birth/Sex: Treating RN: 1950-07-23 (68 y.o. [name] Primary Care Provider: PATIENT, NO Other Clinician: Referring Provider: Treating Provider/Extender:Tais Koestner, [name], Designated Weeks in Treatment: 21 Verbal / Phone Orders: No Diagnosis Coding Follow-up Appointments Return Appointment in 1 week. Dressing Change Frequency Wound #4 Left,Lateral Malleolus Do not change entire dressing for one week. Skin Barriers/Peri-Wound Care TCA Cream or Ointment - liberal to lower leg Wound Cleansing Wound #4 Left,Lateral Malleolus May shower with protection. Primary Wound Dressing Wound #4  Left,Lateral Malleolus Skin Substitute Application - Oasis Burn Matrix #2. place silver alginate over left calf site for protection Secondary Dressing Wound #4 Left,Lateral Malleolus Adaptic Dressing Drawtex Other: - pad the left lateral and anterior portion of lower leg. Edema Control 4 layer compression: Left lower extremity - no kerlix no cotton. pad with foam to anterior portion of lower leg. Avoid standing for long periods of time Elevate legs to the level of the heart or above for 30 minutes daily and/or when sitting, a frequency of: - throughout the day Exercise regularly Electronic Signature(s) Signed: 04/16/2019 5:20:59 PM By: [name] Signed: 04/16/2019 5:33:43 PM By: Linton Ham MD Entered By: [name] on 04/16/2019 10:39:22 -------------------------------------------------------------------------------- Problem List Details Patient Name: Date of Service: JAVIEL, CLIFT 04/16/2019 9:30 AM Medical Record [id number] Patient Account Number: [id number] Date of Birth/Sex: Treating RN: September 28, 1950 (68 y.o. 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 04/16/2019 9:30 AM Medical Record [id number] Patient Account Number: [id number] Date of Birth/Sex: Treating RN: 09/16/1950 (68 y.o. [name] Primary Care Provider: Other Clinician: PATIENT, NO Referring Provider: Treating Provider/Extender:Ignacio Lowder, [name], Designated Weeks in Treatment: 21 Subjective History of Present Illness (HPI) 08/22/17-He is here for initial evaluation of a right heel wound, right toe wound and left heel fissures. He states that he has chronic dry, flaking skin and frequently has cracked heels. He states the right heel wound developed as such but he developed the wound approximately 3 weeks ago when pulling some skin off; he was unaware of the right toe ulcer. He was treating the right heel with over-the-counter triple antibiotic ointment and peroxide. He presented to urgent care on 3/12 with a 2 x 2 centimeter ulceration to the right heel and was discharged with a 10 day prescription for Bactrim. He does have a history of neuropathy, diet controlled diabetic. His does not know his most recent A1c but will be seeing his PCP next week, there is no  evidence of an A1c in Epic. He denies smoking, does have a history of alcohol use, he admits to 5+/- beers weekly not daily. He did follow-up with cardiology on 3/18 with c/o ble edema, they ordered DVT study which he completed on 3/21. He reports that the bilateral DVT study was negative per the ultrasound technician, no report is available at this time. READMISSION 11/20/2018 this is a now 68 year old man who is here for review of wounds on his lower extremity on the left. We note that he was here on a single visit in March he tells [place] that over the last 2019. His major issue is that he dropped  a box and hit the outside of his left lateral malleolus about 3 weeks ago. This is left him with a nonhealing area. He has been using Iodosorb ointment he had apparently from the last stay in this clinic. Also of note over the last 6 weeks he has noted an area that is raised and will scab over but then he traumatizes a scab and then this bleeds and reopens. This is on the left posterior calf. All of this appears to be on the background of chronic venous insufficiency. He has had DVT studies in the past that were negative in 2019. He is also had arterial studies that showed an ABI on the right of 0.76 and on the left of 1.05. Past medical history; the patient states that he is not a diabetic although I note there is some suggested he was in the note from last year. He has a history of coronary artery disease. He tells me that he has had a history of skin cancer but he is not sure which one but that includes one on the left upper thigh. ABIs done previously 0.76 on the right and 1.05 on the left 6/26; patient readmitted to the clinic last week. He had a traumatic area over the left lateral malleolus as well as a hyper granulated growth on the left posterior calf. He also has severe chronic venous insufficiency. 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 We have been using Iodoflex to the traumatic wound under compression. 7/9; area over the left lateral malleolus continues to require debridement and we are continuing with Iodoflex under compression. He has severe chronic venous insufficiency. 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 This patient has severe bilateral venous insufficiency and venous hypertension with skin changes resulting. He requires bilateral reflux studies Finally he traumatized his right lateral calf  while doing yard work about a week ago. The area is small with a flap of skin over the majority of the wound but I am not sure this is going to remain viable 7/16; still not a viable surface on the left lateral malleolus in fact this wound is deeper. We have been using Iodoflex. 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 He has severe bilateral venous insufficiency and I have ordered reflux studies these apparently are booked for next week. Our intake nurse noted some yellowish-green drainage coming from the wound on the left ankle. Patient was insistent on talking about doing carotid ultrasounds and he was hoping to get these  done with his reflux studies next week. I spent a few minutes talking to him about this I just could not put together in a logical way what he is concerned about. At the end of this I asked him to see his primary doctor about this he says he does not have a primary doctor advised him to get a primary doctor to go over this with him. It was not really clear where the level of concern was specific to carotid ultrasound 7/23; a better looking surface on the left lateral malleolus although it still requires debridement. He tells me he has an appointment to [facility] surgery center next week. Small area on the right lateral calf. The patient went to [facility] his [facility] I do not think they took his compression wraps off. He did have reflux on the left in the common femoral greater saphenous and saphenofemoral junction and the great saphenous vein in the proximal thigh. 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 Looking at the numbers it would appear that the maximal reflux was in the great saphenous vein in the proximal  thigh. As usual I am not sure what would be possible to do here. I will send him to vascular surgery 7/30; the patient still requires debridement in the left lateral malleolus although in general the wound is still deep and and punched out. We are using Sorbact. The area on the right lateral calf is improved. 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 The patient does not have a vascular surgery appointment nor does he have a skin surgery center appointment. I am really not sure what the issues are here although I know vascular surgery as well behind. 8/6-Patient does not have a vascular appointment nor does he have a skin surgery appointment firmed up at this time although he says he is called them both, impressed upon him that he should see whoever is available for the vascular so the vein studies can be interpreted. 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 He is here for the left lateral malleoli wound for which we are using SOrbact, and he is disappointed the wound is not healing 8/21; patient saw Dr. Doren Custard on 01/21/2019. Dr. Doren Custard was concerned about his arterial flow. Noting that he had trouble feeling the dorsalis pedis and posterior tibial arteries also noteworthy that the Doppler had a barely biphasic posterior tibial signal with a monophasic but fairly brisk dorsalis pedis signal. He is booked for an angiogram next Friday. We have been using 3 layer compression on him. Also he is supposed to have Mohs surgery on the cancer site on his  left posterior calf next Wednesday. Given Dr. Mee Hives concern it was difficult for me to be comfortable with him going ahead with the cancer extraction and I have suggested canceling this. Finally he has an increase in the erythematous contact dermatitis in the entire  wrapped area of the left leg which is probably an allergy to the contact layer of the 3 layer. 8/27; he is going for his angiogram tomorrow by Dr. Doren Custard. We cancel the Mohs surgery on the tumor on his posterior calf pending the angiogram tomorrow. If this shows adequate blood flow to this area I think we can rebook this. His main wound is on the left lateral malleolus a small refractory punched-out area. We have been using Hydrofera Blue 9/3; his angiogram was done as scheduled. He does indeed have PAD. The common femoral, deep femoral superficial femoral and popliteal arteries will are all widely patent. 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 The overall thought was that he had enough adequate circulation to heal his venous ulcer and continue to use mild elevation and compression. Noted that he had biphasic posterior tibial signals with a Doppler ABI of 0.91. It was not felt that the stenosis in the proximal tibial artery would be associated with increased blood flow he will follow-up with Dr. Doren Custard in 1 month He now needs to get the skin surgery appointment set up. The area is growing larger and bleeding frequently on the posterior left calf per the patient 02/11/19-Patient is back after 1 week, results of his vascular studies are noted, patient needs to have a skin surgery appointment established apparently he did not return the calls this week. The area on the posterior calf is where the skin surgery needs to be done, the left lateral  malleoli are wound appears to be about the same we will using Hydrofera Blue change 9/24; patient finally has a booking to deal with the cancer on his posterior calf I believe with the skin surgery center. Not much change in the wound over the left lateral malleolus. It is been 2 weeks since he was here. I changed him to endoform last time 10/2; the patient has been to see dermatology. Scheduled for surgery on 10/14. Using endoform to the wound over the lateral malleolus some improvement in the wound condition but not the surface area. He has severe chronic venous insufficiency. He also has some arterial disease he was angiogram by Dr. Doren Custard. He did not think that anything needed to be done from an intervention point of view. 10/8; his surgery is still scheduled for 10/14. It is concerning about whether he is healed or maintained skin integrity and is sutured area given the severity of his chronic venous insufficiency. The area we are following is on the left lateral malleolus. Some improvement in the surface area. No debridement today we have been using endoform 10/15; the patient finally had his surgery for the tumor on his left posterior calf. We did not look at this today. Still a punched out area in the lower left malleolus. Perhaps some mild improvement we have been using endoform We were denied for Grafix and still have not heard back on Oasis 10/22; no real change in this wound. I am able to debride it to a healthy looking surface but the next week there is recurrent debris and no improvement in depth. Punched out over the left lateral malleolus. We have not heard back on Oasis. We were denied for Grafix 11/6; wound is perhaps slightly smaller. Still punched out. He was approved for Oasis we applied [employer]  1. His surgical wound on the back of the left calf was reviewed by dermatology we are not reviewing this 11/13; I think the wound is filled in somewhat. Certainly a much more vibrant  looking surface. Oasis #2. I looked at his surgical site on the back of the calf today for the first time. Clean incision small open areas Objective Constitutional Pulse regular and within target range for patient.Marland Kitchen Respirations regular, non-labored and within target range.. Temperature is normal and within the target range for the patient.. Vitals Time Taken: 9:42 AM, Height: 73 in, Weight: 205 lbs, BMI: 27, Temperature: 98.2 F, Pulse: 88 bpm, Respiratory Rate: 18 breaths/min. Respiratory work of breathing is normal. General Notes: Wound exam; the area is on the left lateral malleolus. Better looking surface. Slough washed out with Anasept and gauze. I think this is filled in somewhat. There is no surrounding infection. Integumentary (Hair, Skin) Wound #4 status is Open. Original cause of wound was Trauma. The wound is located on the Left,Lateral Malleolus. The wound measures 0.7cm length x 0.7cm width x 0.3cm depth; 0.385cm^2 area and 0.115cm^3 volume. There is Fat Layer (Subcutaneous Tissue) Exposed exposed. There is no tunneling or undermining noted. There is a medium amount of serosanguineous drainage noted. The wound margin is well defined and not attached to the wound base. There is medium (34-66%) pink, pale granulation within the wound bed. There is a medium (34- 66%) amount of necrotic tissue within the wound bed including Adherent Slough. 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 A skin graft procedure using a bioengineered skin substitute/cellular or tissue based product was performed by Ricard Dillon., MD with the following  instrument(s): Forceps and Scissors. Oasis wound matrix was applied and secured with Steri-Strips. 5 sq cm of product was utilized and 5 sq cm was wasted due to size of wound. Post Application, drawtex was applied. A Time Out was conducted at 10:29, prior to the start of the procedure. The procedure was tolerated well. Post procedure Diagnosis Wound #4: Same as Pre-Procedure General Notes: oasis burn matrix. Pre-procedure diagnosis of Wound #4 is a Venous Leg Ulcer located on the Left,Lateral Malleolus . There was a Four Layer Compression Therapy Procedure by [name], RN. Post procedure Diagnosis Wound #4: Same as Pre-Procedure Plan Follow-up Appointments: Return Appointment in 1 week. Dressing Change Frequency: Wound #4 Left,Lateral Malleolus: Do not change entire dressing for one week. Skin Barriers/Peri-Wound Care: TCA Cream or Ointment - liberal to lower leg Wound Cleansing: Wound #4 Left,Lateral Malleolus: May shower with protection. Primary Wound Dressing: Wound #4 Left,Lateral Malleolus: Skin Substitute Application - Oasis Burn Matrix #2. place silver alginate over left calf site for protection Secondary Dressing: Wound #4 Left,Lateral Malleolus: Adaptic Dressing Drawtex Other: - pad the left lateral and anterior portion of lower leg. Edema Control: 4 layer compression: Left lower extremity - no kerlix no cotton. pad with foam to anterior portion of lower leg. Avoid standing for long periods of time Elevate legs to the level of the heart or above for 30 minutes daily and/or when sitting, a frequency of: - throughout the day Exercise regularly 1. I have continue with it Oasis/Adaptic under 4-layer compression 2. We put some alginate  and foam on top of his surgical wound which has a very tiny incisional opening Electronic Signature(s) Signed: 04/16/2019 5:20:07 PM By: Baruch Gouty RN, BSN Signed: 04/16/2019 5:33:43 PM By: Linton Ham MD Entered By: Baruch Gouty on 04/16/2019 13:58:50 -------------------------------------------------------------------------------- SuperBill Details Patient Name: Date of Service: IZEKIEL, GERARDOT 04/16/2019 Medical Record [id number] Patient Account Number: [id number] Date of Birth/Sex: Treating RN: [birth year] (68 y.o. 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, including hip Facility Procedures CPT4: Code [id number] Q4 Description: 103 - Dermal substitute tissue/non-human origin w metabolically active elements- Oasis (Burn Matrix)product applied per sq cm (Facility Only) ICD-10 Diagnosis Description L97.321 Non-pressure chronic ulcer of left ankle limited to breakdown  of Modifier Quantity: 10 skin CPT4: [id number] Description: 271 - SKIN SUB GRAFT TRNK/ARM/LEG ICD-10 Diagnosis Description L97.321 Non-pressure chronic ulcer of left ankle limited to breakdown of Modifier Quantity: 1 skin Physician Procedures CPT4 Code Description: [id number] - WC PHYS SKIN SUB GRAFT TRNK/ARM/LEG ICD-10 Diagnosis Description L97.321 Non-pressure chronic ulcer of left ankle limited to breakdo Modifier: wn of skin Quantity: 1 Electronic Signature(s) Signed: 04/16/2019 5:20:59 PM By: [name] Signed: 04/16/2019 5:33:43 PM By: Linton Ham MD Entered By: [name] on 04/16/2019 11:10:50

## 2019-04-22 ENCOUNTER — Encounter (HOSPITAL_BASED_OUTPATIENT_CLINIC_OR_DEPARTMENT_OTHER): Payer: Medicare Other | Admitting: Internal Medicine

## 2019-04-22 ENCOUNTER — Other Ambulatory Visit: Payer: Self-pay

## 2019-04-22 DIAGNOSIS — I87332 Chronic venous hypertension (idiopathic) with ulcer and inflammation of left lower extremity: Secondary | ICD-10-CM | POA: Diagnosis not present

## 2019-04-22 NOTE — Progress Notes (Addendum)
AEON, KESSNER ([id number]) Visit Report for 04/22/2019 Arrival Information Details Patient Name: Date of Service: KENWOOD, [name] 04/22/2019 3:30 PM Medical Record [id number] Patient Account Number: [id number] Date of Birth/Sex: Treating RN: 1950/07/06 (68 y.o. [name] Primary Care Hajime Asfaw: PATIENT, NO Other Clinician: Referring Elaysia Devargas: Treating Briann Sarchet/Extender:Robson, [name], Designated Weeks in Treatment: 21 Visit Information History Since Last Visit Added or deleted any medications: No Patient Arrived: [name] Any new allergies or adverse reactions: No Arrival Time: 16:25 Had a fall or experienced change in No Accompanied By: alone activities of daily living that may affect Transfer Assistance: None risk of falls: Patient Identification Verified: Yes Signs or symptoms of abuse/neglect since last No Secondary Verification Process Yes visito Completed: Hospitalized since last visit: No Patient Requires Transmission- No Implantable device outside of the clinic excluding No Based Precautions: cellular tissue based products placed in the center Patient Has Alerts: Yes ABIs 08/2018 L1.05 since last visit: Patient Alerts: Has Dressing in Place as Prescribed: Yes R0.76 Has Compression in Place as Prescribed: Yes Pain Present Now: No Electronic Signature(s) Signed: 04/22/2019 6:05:31 PM By: [name] Entered By: [name] on 04/22/2019 16:26:05 -------------------------------------------------------------------------------- Compression Therapy Details Patient Name: Date of Service: Michael, [name] 04/22/2019 3:30 PM Medical Record [id number] Patient Account Number: [id number] Date of Birth/Sex: Treating RN: 1950-07-12 (68 y.o. [name] Primary Care Natausha Jungwirth: PATIENT, NO Other Clinician: Referring Hortencia Martire: Treating Ellarose Brandi/Extender:Robson, [name], Designated Weeks in Treatment: 21 Compression  Therapy Performed for Wound Wound #4 Left,Lateral Malleolus Assessment: Performed By: Clinician Baruch Gouty, RN Compression Type: Four Layer Post Procedure Diagnosis Same as Pre-procedure Electronic Signature(s) Signed: 04/22/2019 6:38:14 PM By: [name] Entered By: [name] on 04/22/2019 17:05:28 -------------------------------------------------------------------------------- Encounter Discharge Information Details Patient Name: Date of Service: TIMOHTY, [name] 04/22/2019 3:30 PM Medical Record [id number] Patient Account Number: [id number] Date of Birth/Sex: Treating RN: 11/17/1950 (68 y.o. [name] Primary Care Princes Finger: PATIENT, NO Other Clinician: Referring Dorothye Berni: Treating Krishna Heuer/Extender:Robson, [name], Designated Weeks in Treatment: 21 Encounter Discharge Information Items Post Procedure Vitals Discharge Condition: Stable Temperature (F): 98.8 Ambulatory Status: Cane Pulse (bpm): 73 Discharge Destination: Home Respiratory Rate (breaths/min): 20 Transportation: Private Auto Blood Pressure (mmHg): 161/89 Accompanied By: self Schedule Follow-up Appointment: Yes Clinical Summary of Care: Electronic Signature(s) Signed: 04/22/2019 6:38:14 PM By: [name] Entered By: [name] on 04/22/2019 17:35:38 -------------------------------------------------------------------------------- Lower Extremity Assessment Details Patient Name: Date of Service: PEYTON, [name] 04/22/2019 3:30 PM Medical Record [id number] Patient Account Number: [id number] Date of Birth/Sex: Treating RN: 26-Jan-1951 (68 y.o. [name] Primary Care Anjali Manzella: PATIENT, NO Other Clinician: Referring Chloee Tena: Treating Keaun Schnabel/Extender:Robson, [name], Designated Weeks in Treatment: 21 Edema Assessment Assessed: [Left: No] [Right: No] Edema: [Left: No] [Right: No] Calf Left: Right: Point of Measurement: 35 cm From Medial Instep 32  cm cm Ankle Left: Right: Point of Measurement: 9 cm From Medial Instep 23.9 cm cm Vascular Assessment Pulses: Dorsalis Pedis Palpable: [Left:Yes] Electronic Signature(s) Signed: 04/22/2019 6:05:31 PM By: [name] Entered By: [name] on 04/22/2019 16:34:01 -------------------------------------------------------------------------------- Multi Wound Chart Details Patient Name: Date of Service: [name]. 04/22/2019 3:30 PM Medical Record [id number] Patient Account Number: [id number] Date of Birth/Sex: Treating RN: 1951/03/07 (68 y.o. [name] Primary Care Derhonda Eastlick: PATIENT, NO Other Clinician: Referring Laryn Venning: Treating Renelda Kilian/Extender:Robson, [name], Designated Weeks in Treatment: 21 Vital Signs Height(in): 73 Pulse(bpm): 75 Weight(lbs): 205 Blood Pressure(mmHg): 198/102 Body Mass Index(BMI): 27 Temperature(F): 98.8 Respiratory 18 Rate(breaths/min): Photos: [4:No Photos] [N/A:N/A] Wound Location: [4:Left  Malleolus - Lateral] [N/A:N/A] Wounding Event: [4:Trauma] [N/A:N/A] Primary Etiology: [4:Venous Leg Ulcer] [N/A:N/A] Comorbid History: [4:Arrhythmia, Coronary Artery N/A Disease, Type II Diabetes] Date Acquired: [4:11/02/2018] [N/A:N/A] Weeks of Treatment: [4:21] [N/A:N/A] Wound Status: [4:Open] [N/A:N/A] Measurements L x W x D 0.9x0.8x0.4 [N/A:N/A] (cm) Area (cm) : [4:0.565] [N/A:N/A] Volume (cm) : [4:0.226] [N/A:N/A] % Reduction in Area: [4:56.40%] [N/A:N/A] % Reduction in Volume: -73.80% [N/A:N/A] Classification: [4:Full Thickness Without Exposed Support Structures] [N/A:N/A] Exudate Amount: [4:Medium] [N/A:N/A] Exudate Type: [4:Serous] [N/A:N/A] Exudate Color: [4:amber] [N/A:N/A] Wound Margin: [4:Well defined, not attached N/A] Granulation Amount: [4:Medium (34-66%)] [N/A:N/A] Granulation Quality: [4:Pink, Pale] [N/A:N/A] Necrotic Amount: [4:Medium (34-66%)] [N/A:N/A] Exposed Structures: [4:Fat Layer  (Subcutaneous N/A Tissue) Exposed: Yes Fascia: No Tendon: No Muscle: No Joint: No Bone: No] Epithelialization: [4:Small (1-33%)] [N/A:N/A] Debridement: [4:Debridement - Excisional N/A] Pre-procedure [4:16:50] [N/A:N/A] Verification/Time Out Taken: Pain Control: [4:Lidocaine 4% Topical Solution] [N/A:N/A] Tissue Debrided: [4:Subcutaneous, Slough] [N/A:N/A] Level: [4:Skin/Subcutaneous Tissue] [N/A:N/A] Debridement Area (sq cm):0.72 [N/A:N/A] Instrument: [4:Curette] [N/A:N/A] Bleeding: [4:Minimum] [N/A:N/A] Hemostasis Achieved: [4:Pressure] [N/A:N/A] Procedural Pain: [4:0] [N/A:N/A] Post Procedural Pain: [4:0] [N/A:N/A] Debridement Treatment Procedure was tolerated [N/A:N/A] Response: [4:well] Post Debridement [4:0.7x0.8x0.4] [N/A:N/A] Measurements L x W x D (cm) Post Debridement [4:0.176] [N/A:N/A] Volume: (cm) Procedures Performed: Cellular or Tissue Based [4:Product Compression Therapy Debridement] [N/A:N/A] Treatment Notes Electronic Signature(s) Signed: 04/22/2019 6:25:25 PM By: Linton Ham MD Signed: 04/22/2019 6:38:14 PM By: [name] Entered By: Linton Ham on 04/22/2019 17:26:27 -------------------------------------------------------------------------------- Multi-Disciplinary Care Plan Details Patient Name: Date of Service: ARUL, FARABEE 04/22/2019 3:30 PM Medical Record [id number] Patient Account Number: [id number] Date of Birth/Sex: Treating RN: 1951-04-18 (68 y.o. [name] Primary Care Elara Cocke: Other Clinician: PATIENT, NO Referring Lecia Esperanza: Treating Caterin Tabares/Extender:Robson, [name], Designated Weeks in Treatment: 21 Active Inactive Venous Leg Ulcer Nursing Diagnoses: Knowledge deficit related to disease process and management Potential for venous Insuffiency (use before diagnosis confirmed) Goals: Patient will maintain optimal edema control Date Initiated: 11/20/2018 Target Resolution Date: 05/07/2019 Goal Status:  Active Patient/caregiver will verbalize understanding of disease process and disease management Date Initiated: 11/20/2018 Date Inactivated: 12/17/2018 Target Resolution Date: 12/18/2018 Goal Status: Met Interventions: Assess peripheral edema status every visit. Compression as ordered Provide education on venous insufficiency Treatment Activities: Therapeutic compression applied : 11/20/2018 Notes: Electronic Signature(s) Signed: 04/22/2019 6:38:14 PM By: [name] Entered By: [name] on 04/22/2019 16:38:37 -------------------------------------------------------------------------------- Pain Assessment Details Patient Name: Date of Service: KAHLIL, COWANS 04/22/2019 3:30 PM Medical Record [id number] Patient Account Number: [id number] Date of Birth/Sex: Treating RN: Mar 03, 1951 (68 y.o. [name] Primary Care Jefferie Holston: PATIENT, NO Other Clinician: Referring Prakash Kimberling: Treating Terilyn Sano/Extender:Robson, [name], Designated Weeks in Treatment: 21 Active Problems Location of Pain Severity and Description of Pain Patient Has Paino Yes Site Locations Pain Location: Generalized Pain, Pain in Ulcers With Dressing Change: Yes Duration of the Pain. Constant / Intermittento Constant Character of Pain Describe the Pain: Aching, Burning Pain Management and Medication Current Pain Management: Electronic Signature(s) Signed: 04/22/2019 6:05:31 PM By: [name] Entered By: [name] on 04/22/2019 16:31:26 -------------------------------------------------------------------------------- Patient/Caregiver Education Details Patient Name: Date of Service: [name] 11/19/2020andnbsp3:30 PM Medical Record [id number] Patient Account Number: [id number] Date of Birth/Gender: Treating RN: 1950-09-06 (68 y.o. [name] Primary Care Physician: PATIENT, NO Other Clinician: Referring Physician: Treating  Physician/Extender:Robson, [name], Designated Weeks in Treatment: 21 Education Assessment Education Provided To: Patient Education Topics Provided Venous: Handouts: Managing Venous Disease and Related Ulcers Methods: Explain/Verbal Responses: Reinforcements needed Electronic Signature(s) Signed: 04/22/2019 6:38:14 PM By: Rolin Barry,  Bobbi Entered By: [name] on 04/22/2019 16:41:39 -------------------------------------------------------------------------------- Wound Assessment Details Patient Name: Date of Service: LEAF, KERNODLE 04/22/2019 3:30 PM Medical Record [id number] Patient Account Number: [id number] Date of Birth/Sex: Treating RN: 07-28-1950 (68 y.o. [name] Primary Care Dawnna Gritz: PATIENT, NO Other Clinician: Referring Nalaya Wojdyla: Treating [name] [name]/Extender:Robson, [name], Designated Weeks in Treatment: 21 Wound Status Wound Number: 4 Primary Venous Leg Ulcer Etiology: Wound Location: Left Malleolus - Lateral Wound Open Wounding Event: Trauma Status: Date Acquired: 11/02/2018 Comorbid Arrhythmia, Coronary Artery Disease, Weeks Of Treatment: 21 History: Type II Diabetes Clustered Wound: No Photos Wound Measurements Length: (cm) 0.9 % Reduct Width: (cm) 0.8 % Reduct Depth: (cm) 0.4 Epitheli Area: (cm) 0.565 Tunneli Volume: (cm) 0.226 Undermi Wound Description Classification: Full Thickness Without Exposed Support Foul Od Structures Slough/ Wound Well defined, not attached Margin: Exudate Medium Amount: Exudate Serous Type: Exudate amber Color: Wound Bed Granulation Amount: Medium (34-66%) Granulation Quality: Pink, Pale Fascia E Necrotic Amount: Medium (34-66%) Fat Laye Necrotic Quality: Adherent Slough Tendon E Muscle E Joint Ex Bone Exp Treatment Notes Wound #4 (Left, Lateral Malleolus) 2. Periwound Care TCA Cream 3. Primary Dressing Applied Cellular Based Tissue Product 4. Secondary Dressing Dry  Gauze Foam Drawtex 6. Support Layer Applied 4 layer compression wrap or After Cleansing: No Fibrino Yes Exposed Structure xposed: No r (Subcutaneous Tissue) Exposed: Yes xposed: No xposed: No posed: No osed: No ion in Area: 56.4% ion in Volume: -73.8% alization: Small (1-33%) ng: No ning: No Notes oasis burn covered with adaptic and secured with steristrips Electronic Signature(s) Signed: 04/28/2019 2:51:44 PM By: Mikeal Hawthorne EMT/HBOT Signed: 04/28/2019 3:17:43 PM By: [name] Previous Signature: 04/22/2019 6:05:31 PM Version By: [name] Entered By: Mikeal Hawthorne on 04/28/2019 08:41:15 -------------------------------------------------------------------------------- Vitals Details Patient Name: Date of Service: [name] 04/22/2019 3:30 PM Medical Record [id number] Patient Account Number: [id number] Date of Birth/Sex: Treating RN: 21-Apr-1951 (68 y.o. [name] Primary Care Verdie Wilms: PATIENT, NO Other Clinician: Referring Donaldson Richter: Treating Jaquanda Wickersham/Extender:Robson, [name], Designated Weeks in Treatment: 21 Vital Signs Time Taken: 16:25 Temperature (F): 98.8 Height (in): 73 Pulse (bpm): 75 Weight (lbs): 205 Respiratory Rate (breaths/min): 18 Body Mass Index (BMI): 27 Blood Pressure (mmHg): 198/102 Reference Range: 80 - 120 mg / dl Notes says he is in pain. no s/s of headache or dizziness. MD notified Electronic Signature(s) Signed: 04/22/2019 6:05:31 PM By: [name] Entered By: [name] on 04/22/2019 16:31:02

## 2019-04-22 NOTE — Progress Notes (Addendum)
KEMOND, GANNAWAY ([id number]) Visit Report for 04/22/2019 Cellular or Tissue Based Product Details Patient Name: [name], [name]. Date of Service: 04/22/2019 3:30 PM Medical Record [id number] Patient Account Number: [id number] Date of Birth/Sex: Treating RN: [birth year] (68 y.o. [name] Primary Care Provider: PATIENT, NO Other Clinician: Referring Provider: Treating Provider/Extender:Robson, [name], Designated Weeks in Treatment: 21 Cellular or Tissue Based Wound #4 Left,Lateral Malleolus Product Type Applied to: Performed By: Physician Ricard Dillon., MD Cellular or Tissue Based Oasis wound matrix Product Type: Level of Consciousness (Pre- Awake and Alert procedure): Pre-procedure Yes - 16:55 Verification/Time Out Taken: Location: genitalia / hands / feet / multiple digits Wound Size (sq cm): 0.72 Product Size (sq cm): 10 Waste Size (sq cm): 5 Waste Reason: wound size Amount of Product Applied (sq cm): 5 Instrument Used: Forceps, Scissors Lot #: [id number] Order #: 3 Expiration Date: 06/05/2020 Fenestrated: No Reconstituted: Yes Solution Type: normal saline Solution Amount: 10 mL Lot #: [id number] Expiration Date: 09/30/2020 Secured: Yes Secured With: Steri-Strips, adaptic Dressing Applied: No Procedural Pain: 1 Post Procedural Pain: 3 Response to Treatment: Procedure was tolerated well Level of Consciousness Awake and Alert (Post-procedure): Post Procedure Diagnosis Same as Pre-procedure Electronic Signature(s) Signed: 04/22/2019 6:25:25 PM By: Linton Ham MD Entered By: Linton Ham on 04/22/2019 17:26:46 -------------------------------------------------------------------------------- Debridement Details Patient Name: [name]. Date of Service: 04/22/2019 3:30 PM Medical Record [id number] Patient Account Number: [id number] Date of Birth/Sex: Treating RN: [birth year] (68 y.o. [name] Primary Care  Provider: PATIENT, NO Other Clinician: Referring Provider: Treating Provider/Extender:Robson, [name], Designated Weeks in Treatment: 21 Debridement Performed for Wound #4 Left,Lateral Malleolus Assessment: Performed By: Physician Ricard Dillon., MD Debridement Type: Debridement Severity of Tissue Pre Fat layer exposed Debridement: Level of Consciousness (Pre- Awake and Alert procedure): Pre-procedure Yes - 16:50 Verification/Time Out Taken: Start Time: 16:51 Pain Control: Lidocaine 4% Topical Solution Total Area Debrided (L x W): 0.9 (cm) x 0.8 (cm) = 0.72 (cm) Tissue and other material Viable, Non-Viable, Slough, Subcutaneous, Skin: Dermis , Fibrin/Exudate, Slough debrided: Level: Skin/Subcutaneous Tissue Debridement Description: Excisional Instrument: Curette Bleeding: Minimum Hemostasis Achieved: Pressure End Time: 16:54 Procedural Pain: 0 Post Procedural Pain: 0 Response to Treatment: Procedure was tolerated well Level of Consciousness Awake and Alert (Post-procedure): Post Debridement Measurements of Total Wound Length: (cm) 0.7 Width: (cm) 0.8 Depth: (cm) 0.4 Volume: (cm) 0.176 Character of Wound/Ulcer Post Improved Debridement: Severity of Tissue Post Debridement: Fat layer exposed Post Procedure Diagnosis Same as Pre-procedure Electronic Signature(s) Signed: 04/22/2019 6:25:25 PM By: Linton Ham MD Signed: 04/22/2019 6:38:14 PM By: [name] Entered By: Linton Ham on 04/22/2019 17:26:33 -------------------------------------------------------------------------------- HPI Details Patient Name: Date of Service: [name]. 04/22/2019 3:30 PM Medical Record [id number] Patient Account Number: [id number] Date of Birth/Sex: Treating RN: 1951-03-24 (68 y.o. [name] Primary Care Provider: PATIENT, NO Other Clinician: Referring Provider: Treating Provider/Extender:Robson, [name], Designated Weeks in  Treatment: 21 History of Present Illness HPI Description: 08/22/17-He is here for initial evaluation of a right heel wound, right toe wound and left heel fissures. He states that he has chronic dry, flaking skin and frequently has cracked heels. He states the right heel wound developed as such but he developed the wound approximately 3 weeks ago when pulling some skin off; he was unaware of the right toe ulcer. He was treating the right heel with over-the-counter triple antibiotic ointment and peroxide. He presented to urgent care on 3/12 with a 2 x 2 centimeter ulceration to  the right heel and was discharged with a 10 day prescription for Bactrim. He does have a history of neuropathy, diet controlled diabetic. His does not know his most recent A1c but will be seeing his PCP next week, there is no evidence of an A1c in Epic. He denies smoking, does have a history of alcohol use, he admits to 5+/- beers weekly not daily. He did follow-up with cardiology on 3/18 with c/o ble edema, they ordered DVT study which he completed on 3/21. He reports that the bilateral DVT study was negative per the ultrasound technician, no report is available at this time. READMISSION 11/20/2018 this is a now 68 year old man who is here for review of wounds on his lower extremity on the left. We note that he was here on a single visit in March he tells [place] that over the last 2019. His major issue is that he dropped a box and hit the outside of his left lateral malleolus about 3 weeks ago. This is left him with a nonhealing area. He has been using Iodosorb ointment he had apparently from the last stay in this clinic. Also of note over the last 6 weeks he has noted an area that is raised and will scab over but then he traumatizes a scab and then this bleeds and reopens. This is on the left posterior calf. All of this appears to be on the background of chronic venous insufficiency. He has had DVT studies in the past that were  negative in 2019. He is also had arterial studies that showed an ABI on the right of 0.76 and on the left of 1.05. Past medical history; the patient states that he is not a diabetic although I note there is some suggested he was in the note from last year. He has a history of coronary artery disease. He tells me that he has had a history of skin cancer but he is not sure which one but that includes one on the left upper thigh. ABIs done previously 0.76 on the right and 1.05 on the left 6/26; patient readmitted to the clinic last week. He had a traumatic area over the left lateral malleolus as well as a hyper granulated growth on the left posterior calf. He also has severe chronic venous insufficiency. 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 We have been using Iodoflex to the traumatic wound under compression. 7/9; area over the left lateral malleolus continues to require debridement and we are continuing with Iodoflex under compression. He has severe chronic venous insufficiency. 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 This patient has severe bilateral venous insufficiency and venous hypertension with skin changes resulting. He requires bilateral reflux studies Finally he traumatized his right lateral calf while doing yard work about a week ago. The area is small with a flap of skin over the majority of the wound but I am not sure this is going to remain viable 7/16; still not a viable surface on the left lateral malleolus in fact this wound is deeper. We have been using Iodoflex. 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 He has severe bilateral venous insufficiency and I have  ordered reflux studies  these apparently are booked for next week. Our intake nurse noted some yellowish-green drainage coming from the wound on the left ankle. Patient was insistent on talking about doing carotid ultrasounds and he was hoping to get these done with his reflux studies next week. I spent a few minutes talking to him about this I just could not put together in a logical way what he is concerned about. At the end of this I asked him to see his primary doctor about this he says he does not have a primary doctor advised him to get a primary doctor to go over this with him. It was not really clear where the level of concern was specific to carotid ultrasound 7/23; a better looking surface on the left lateral malleolus although it still requires debridement. He tells me he has an appointment to Mohs surgery center next week. Small area on the right lateral calf. The patient went to have his reflux studies at [facility] I do not think they took his compression wraps off. He did have reflux on the left in the common femoral greater saphenous and saphenofemoral junction and the great saphenous vein in the proximal thigh. 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 Looking at the numbers it would appear that the maximal reflux was in the great saphenous vein in the proximal thigh. As usual I am not sure what would be possible to do here. I will send him to vascular surgery 7/30; the patient still requires debridement in the left lateral malleolus although in general the wound is still deep and and punched out. We are using Sorbact. The area on the right lateral calf is improved. 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 The patient does not have a vascular surgery appointment nor does he have a skin surgery center appointment. I am really not sure what the  issues are here although I know vascular surgery as well behind. 8/6-Patient does not have a vascular appointment nor does he have a skin surgery appointment firmed up at this time although he says he is called them both, impressed upon him that he should see whoever is available for the vascular so the vein studies can be interpreted. 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 He is here for the left lateral malleoli wound for which we are using SOrbact, and he is disappointed the wound is not healing 8/21; patient saw Dr. Doren Custard on 01/21/2019. Dr. Doren Custard was concerned about his arterial flow. Noting that he had trouble feeling the dorsalis pedis and posterior tibial arteries also noteworthy that the Doppler had a barely biphasic posterior tibial signal with a monophasic but fairly brisk dorsalis pedis signal. He is booked for an angiogram next Friday. We have been using 3 layer compression on him. Also he is supposed to have Mohs surgery on the cancer site on his left posterior calf  next Wednesday. Given Dr. Mee Hives concern it was difficult for me to be comfortable with him going ahead with the cancer extraction and I have suggested canceling this. Finally he has an increase in the erythematous contact dermatitis in the entire wrapped area of the left leg which is probably an allergy to the contact layer of the 3 layer. 8/27; he is going for his angiogram tomorrow by Dr. Doren Custard. We cancel the Mohs surgery on the tumor on his posterior calf pending the angiogram tomorrow.  If this shows adequate blood flow to this area I think we can rebook this. His main wound is on the left lateral malleolus a small refractory punched-out area. We have been using Hydrofera Blue 9/3; his angiogram was done as scheduled. He does indeed have PAD. The common femoral, deep femoral superficial femoral and popliteal arteries will are all widely patent. 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 The overall thought was that he had enough adequate circulation to heal his venous ulcer and continue to use mild elevation and compression. Noted that he had biphasic posterior tibial signals with a Doppler ABI of 0.91. It was not felt that the stenosis in the proximal tibial artery would be associated with increased blood flow he will follow-up with Dr. Doren Custard in 1 month He now needs to get the skin surgery appointment set up. The area is growing larger and bleeding frequently on the posterior left calf per the patient 02/11/19-Patient is back after 1 week, results of his vascular studies are noted, patient needs to have a skin surgery appointment established apparently he did not return the calls this week. The area on the posterior calf is where the skin surgery needs to be done, the left lateral malleoli are wound appears to be about the same we will using Hydrofera Blue change 9/24; patient finally has a booking to deal with the cancer on his posterior calf I believe with the skin surgery center. Not much change in the wound over the left lateral malleolus. It is been 2 weeks since he was here. I changed him to endoform last time 10/2; the patient has been to see dermatology. Scheduled for surgery on 10/14. Using endoform to the wound over the lateral malleolus some improvement in the wound condition but  not the surface area. He has severe chronic venous insufficiency. He also has some arterial disease he was angiogram by Dr. Doren Custard. He did not think that anything needed to be done from an intervention point of view. 10/8; his surgery is still scheduled for 10/14. It is concerning about whether he is healed or maintained skin integrity and is sutured area given the severity of his chronic venous insufficiency. The area we are following is on the left lateral malleolus. Some improvement in the surface area. No debridement today we have been using endoform 10/15; the patient finally had his surgery for the tumor on his left posterior calf. We did not look at this today. Still a punched out area in the lower left malleolus. Perhaps some mild improvement we have been using endoform We were denied for Grafix and still have not heard back on Oasis 10/22; no real change in this wound. I am able to debride it to a healthy looking surface but the next week there is recurrent  debris and no improvement in depth. Punched out over the left lateral malleolus. We have not heard back on Oasis. We were denied for Grafix 11/6; wound is perhaps slightly smaller. Still punched out. He was approved for Oasis we applied Oasis #1. His surgical wound on the back of the left calf was reviewed by dermatology we are not reviewing this 11/13; I think the wound is filled in somewhat. Certainly a much more vibrant looking surface. Oasis #2. I looked at his surgical site on the back of the calf today for the first time. Clean incision small open areas 11/19; measurements not much different today. Still 0.4 cm in depth although post debridement the wound surface looks better. Oasis #3 the site on the back of his calf looks satisfactory. I put him in 4 layer compression last week he seems to tolerate this. He is complaining of some tenderness around the wound. This was controlled swelling around the wound. He seems to have  tolerated this Electronic Signature(s) Signed: 04/22/2019 6:25:25 PM By: Linton Ham MD Entered By: Linton Ham on 04/22/2019 17:28:14 -------------------------------------------------------------------------------- Physical Exam Details Patient Name: Date of Service: [name], [name] 04/22/2019 3:30 PM Medical Record [id number] Patient Account Number: [id number] Date of Birth/Sex: Treating RN: Jan 17, 1951 (68 y.o. [name] Primary Care Provider: PATIENT, NO Other Clinician: Referring Provider: Treating Provider/Extender:Robson, [name], Designated Weeks in Treatment: 21 Constitutional Patient is hypertensive.. Pulse regular and within target range for patient.. Temperature is normal and within the target range for the patient.Marland Kitchen Appears in no distress. Respiratory work of breathing is normal. Integumentary (Hair, Skin) Wound is not overtly infected although there is some tenderness around here.. Notes Wound exam; he arrives with the same slough on the surface. I used a #3 curette to remove this today and he has a vibrant moist surface which is certainly an improvement from some weeks ago although the dimensions are not any different. This does not look to be superficially infected Electronic Signature(s) Signed: 04/22/2019 6:25:25 PM By: Linton Ham MD Entered By: Linton Ham on 04/22/2019 17:30:50 -------------------------------------------------------------------------------- Physician Orders Details Patient Name: Date of Service: [name], [name] 04/22/2019 3:30 PM Medical Record [id number] Patient Account Number: [id number] Date of Birth/Sex: Treating RN: Feb 15, 1951 (68 y.o. [name] Primary Care Provider: PATIENT, NO Other Clinician: Referring Provider: Treating Provider/Extender:Robson, [name], Designated Weeks in Treatment: 21 Verbal / Phone Orders: No Diagnosis Coding ICD-10 Coding Code  Description I87.332 Chronic venous hypertension (idiopathic) with ulcer and inflammation of left lower extremity L97.321 Non-pressure chronic ulcer of left ankle limited to breakdown of skin C44.799 Other specified malignant neoplasm of skin of left lower limb, including hip Follow-up Appointments Return Appointment in 1 week. - to see hoyt. Dressing Change Frequency Wound #4 Left,Lateral Malleolus Do not change entire dressing for one week. Skin Barriers/Peri-Wound Care TCA Cream or Ointment - liberal to lower leg Wound Cleansing Wound #4 Left,Lateral Malleolus May shower with protection. Primary Wound Dressing Wound #4 Left,Lateral Malleolus Skin Substitute Application - Oasis Burn Matrix #3. secured with adaptic and steri-strips. leave in place. do not remove. Secondary Dressing Wound #4 Left,Lateral Malleolus Foam - foam donut. Dry Gauze Other: - pad the left lateral and anterior portion of lower leg. Edema Control 4 layer compression: Left lower extremity - no kerlix no cotton. pad with foam to anterior portion of lower leg. Avoid standing for long periods of time Elevate legs to the level of the heart or above for 30 minutes daily and/or when sitting,  a frequency of: - throughout the day Exercise regularly Radiology X-ray, ankle - x-ray of left ankle related to non-healing ulcer looking for infection. CPT code - (ICD10 L97.321 - Non-pressure chronic ulcer of left ankle limited to breakdown of skin) Electronic Signature(s) Signed: 04/22/2019 6:25:25 PM By: Linton Ham MD Signed: 04/22/2019 6:38:14 PM By: [name] Entered By: [name] on 04/22/2019 17:07:52 -------------------------------------------------------------------------------- Prescription 04/22/2019 Patient Name: [name] Provider: Linton Ham MD Date of Birth: 05/10/1951 NPI#: [id number] Sex: M DEA#: [id number] Phone #: [phone] License #: [id number] Patient Address: [address], [place] [address] [place], [place] [address] [phone] Allergies Compazine Provider's Orders X-ray, ankle - ICD10: L97.321 - x-ray of left ankle related to non-healing ulcer looking for infection. CPT code Signature(s): Date(s): Electronic Signature(s) Signed: 04/22/2019 6:25:25 PM By: Linton Ham MD Signed: 04/22/2019 6:38:14 PM By: [name] Entered By: [name] on 04/22/2019 17:07:53 --------------------------------------------------------------------------------  Problem List Details Patient Name: Date of Service: [name], [name] 04/22/2019 3:30 PM Medical Record [id number] Patient Account Number: [id number] Date of Birth/Sex: Treating RN: [birth year] (68 y.o. [name] Primary Care Provider: PATIENT, NO Other Clinician: Referring Provider: Treating Provider/Extender:Robson, [name], Designated Weeks in Treatment: 21 Active Problems ICD-10 Evaluated Encounter Code Description Active Date Today Diagnosis I87.332 Chronic venous hypertension (idiopathic) with ulcer 11/20/2018 No Yes and inflammation of left lower extremity L97.321 Non-pressure chronic ulcer of left ankle limited to 11/20/2018 No Yes breakdown of skin C44.799 Other specified malignant neoplasm of skin of left 11/27/2018 No Yes lower limb, including hip Inactive Problems ICD-10 Code Description Active Date Inactive Date L97.211 Non-pressure chronic ulcer of right calf limited to breakdown of 12/10/2018 12/10/2018 skin Resolved Problems Electronic Signature(s) Signed: 04/22/2019 6:25:25 PM By: Linton Ham MD Entered By: Linton Ham on 04/22/2019 17:25:50 -------------------------------------------------------------------------------- Progress Note Details Patient Name: Date of Service: [name] 04/22/2019 3:30 PM Medical Record [id number] Patient Account  Number: [id number] Date of Birth/Sex: Treating RN: 1951-01-23 (68 y.o. [name] Primary Care Provider: Other Clinician: PATIENT, NO Referring Provider: Treating Provider/Extender:Robson, [name], Designated Weeks in Treatment: 21 Subjective History of Present Illness (HPI) 08/22/17-He is here for initial evaluation of a right heel wound, right toe wound and left heel fissures. He states that he has chronic dry, flaking skin and frequently has cracked heels. He states the right heel wound developed as such but he developed the wound approximately 3 weeks ago when pulling some skin off; he was unaware of the right toe ulcer. He was treating the right heel with over-the-counter triple antibiotic ointment and peroxide. He presented to urgent care on 3/12 with a 2 x 2 centimeter ulceration to the right heel and was discharged with a 10 day prescription for Bactrim. He does have a history of neuropathy, diet controlled diabetic. His does not know his most recent A1c but will be seeing his PCP next week, there is no evidence of an A1c in Epic. He denies smoking, does have a history of alcohol use, he admits to 5+/- beers weekly not daily. He did follow-up with cardiology on 3/18 with c/o ble edema, they ordered DVT study which he completed on 3/21. He reports that the bilateral DVT study was negative per the ultrasound technician, no report is available at this time. READMISSION 11/20/2018 this is a now 68 year old man who is here for review of wounds on his lower extremity on the left. We  note that he was here on a single visit in March he tells [place] that over the last 2019. His major issue is that he dropped a box and hit the outside of his left lateral malleolus about 3 weeks ago. This is left him with a nonhealing area. He has been using Iodosorb ointment he had apparently from the last stay in this clinic. Also of note over the last 6 weeks he has noted an area that is raised and  will scab over but then he traumatizes a scab and then this bleeds and reopens. This is on the left posterior calf. All of this appears to be on the background of chronic venous insufficiency. He has had DVT studies in the past that were negative in 2019. He is also had arterial studies that showed an ABI on the right of 0.76 and on the left of 1.05. Past medical history; the patient states that he is not a diabetic although I note there is some suggested he was in the note from last year. He has a history of coronary artery disease. He tells me that he has had a history of skin cancer but he is not sure which one but that includes one on the left upper thigh. ABIs done previously 0.76 on the right and 1.05 on the left 6/26; patient readmitted to the clinic last week. He had a traumatic area over the left lateral malleolus as well as a hyper granulated growth on the left posterior calf. He also has severe chronic venous insufficiency. 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 We have been using Iodoflex to the traumatic wound under compression. 7/9; area over the left lateral malleolus continues to require debridement and we are continuing with Iodoflex under compression. He has severe chronic venous insufficiency. 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 This patient has severe bilateral venous insufficiency and venous hypertension with skin changes resulting. He requires bilateral reflux studies Finally he traumatized his right lateral calf while doing yard work about a week ago. The area is small with a flap of skin over the majority of the wound but I am not sure this is going to remain viable 7/16; still not a viable surface on the left lateral malleolus in fact this wound is deeper. We have been using Iodoflex. 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 He has severe bilateral venous insufficiency and I have ordered reflux studies these apparently are booked for next week. Our intake nurse noted some yellowish-green drainage coming from the wound on the left ankle. Patient was insistent on talking about doing carotid ultrasounds and he was hoping to get these done with his reflux studies next week. I spent a few minutes talking to him about this I just could not put together in a logical way what he is concerned about. At the end of this I asked him to see his primary doctor about this he says he does not have a primary doctor advised him to get a primary doctor to go over this with him. It was not really clear where the level of concern was specific to carotid ultrasound 7/23; a better  looking surface on the left lateral malleolus although it still requires debridement. He tells me he has an appointment to Mohs surgery center next week. Small area on the right lateral calf. The patient went to have [facility] I do not think they took his compression wraps off. He did have reflux on the left in the common femoral greater saphenous and saphenofemoral junction and the great saphenous vein in the proximal thigh. 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 Looking at the numbers it would appear that the maximal reflux was in the great saphenous vein in the proximal thigh. As usual I am not sure what would be possible to do here. I will send him to vascular surgery 7/30; the patient still requires debridement in the left lateral malleolus although in general the wound is still deep and and punched out. We are using Sorbact. The area on the right  lateral calf is improved. 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 The patient does not have a vascular surgery appointment nor does he have a skin surgery center appointment. I am really not sure what the issues are here although I know vascular surgery as well behind. 8/6-Patient does not have a vascular appointment nor does he have a skin surgery appointment firmed up at this time although he says he is called them both, impressed upon him that he should see whoever is available for the vascular so the vein studies can be interpreted. 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 He is here for the left lateral malleoli wound for which we are using SOrbact, and he is disappointed the wound is not healing 8/21; patient saw Dr. Doren Custard on 01/21/2019. Dr. Doren Custard was concerned about his arterial flow. Noting that he had trouble feeling the dorsalis pedis and posterior tibial arteries also noteworthy that the Doppler had a barely biphasic posterior tibial signal with a monophasic but fairly brisk dorsalis pedis signal. He is booked for an angiogram next Friday. We have been using 3 layer compression on him. 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 Given Dr. Mee Hives concern it was difficult for me to be comfortable with him going ahead with the cancer extraction and I have suggested canceling this. Finally he has an increase in the erythematous contact dermatitis in the entire wrapped area of the  left leg which is probably an allergy to the contact layer of the 3 layer. 8/27; he is going for his angiogram tomorrow by Dr. Doren Custard. We cancel the Mohs surgery on the tumor on his posterior calf pending the angiogram tomorrow. If this shows adequate blood flow to this area I think we can rebook this. His main wound is on the left lateral malleolus a small refractory punched-out area. We have been using Hydrofera Blue 9/3; his angiogram was done as  scheduled. He does indeed have PAD. The common femoral, deep femoral superficial femoral and popliteal arteries will are all widely patent. 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 The overall thought was that he had enough adequate circulation to heal his venous ulcer and continue to use mild elevation and compression. Noted that he had biphasic posterior tibial signals with a Doppler ABI of 0.91. It was not felt that the stenosis in the proximal tibial artery would be associated with increased blood flow he will follow-up with Dr. Doren Custard in 1 month He now needs to get the skin surgery appointment set up. The area is growing larger and bleeding frequently on the posterior left calf per the patient 02/11/19-Patient is back after 1 week, results of his vascular studies are noted, patient needs to have a skin surgery appointment established apparently he did not return the calls this week. The area on the posterior calf is where the skin surgery needs to be done, the left lateral malleoli are wound appears to be about the same we will using Hydrofera Blue change 9/24; patient finally has a booking to deal with the cancer on his posterior calf I believe with the skin surgery center. Not much change in the wound over the left lateral malleolus. It is been 2 weeks  since he was here. I changed him to endoform last time 10/2; the patient has been to see dermatology. Scheduled for surgery on 10/14. Using endoform to the wound over the lateral malleolus some improvement in the wound condition but not the surface area. He has severe chronic venous insufficiency. He also has some arterial disease he was angiogram by Dr. Doren Custard. He did not think that anything needed to be done from an intervention point of view. 10/8; his surgery is still scheduled for 10/14. It is concerning about whether he is healed or maintained skin integrity and is sutured area given the severity of his chronic venous insufficiency. The area we are following is on the left lateral malleolus. Some improvement in the surface area. No debridement today we have been using endoform 10/15; the patient finally had his surgery for the tumor on his left posterior calf. We did not look at this today. Still a punched out area in the lower left malleolus. Perhaps some mild improvement we have been using endoform We were denied for Grafix and still have not heard back on Oasis 10/22; no real change in this wound. I am able to debride it to a healthy looking surface but the next week there is recurrent debris and no improvement in depth. Punched out over the left lateral malleolus. We have not heard back on Oasis. We were denied for Grafix 11/6; wound is perhaps slightly smaller. Still punched out. He was approved for Oasis we applied Oasis #1. His surgical wound on the back of the left calf was reviewed by dermatology we are not reviewing this 11/13; I think the wound is filled in somewhat. Certainly a much more vibrant looking surface. Oasis #2. I looked at his surgical site on the back of the calf today for the first time. Clean incision small open areas 11/19; measurements not much different today. Still 0.4 cm in depth although post debridement the wound surface looks better. Oasis #3 the site on the  back  of his calf looks satisfactory. I put him in 4 layer compression last week he seems to tolerate this. He is complaining of some tenderness around the wound. This was controlled swelling around the wound. He seems to have tolerated this Objective Constitutional Patient is hypertensive.. Pulse regular and within target range for patient.. Temperature is normal and within the target range for the patient.Marland Kitchen Appears in no distress. Vitals Time Taken: 4:25 PM, Height: 73 in, Weight: 205 lbs, BMI: 27, Temperature: 98.8 F, Pulse: 75 bpm, Respiratory Rate: 18 breaths/min, Blood Pressure: 198/102 mmHg. General Notes: says he is in pain. no s/s of headache or dizziness. MD notified Respiratory work of breathing is normal. General Notes: Wound exam; he arrives with the same slough on the surface. I used a #3 curette to remove this today and he has a vibrant moist surface which is certainly an improvement from some weeks ago although the dimensions are not any different. This does not look to be superficially infected Integumentary (Hair, Skin) Wound is not overtly infected although there is some tenderness around here.. Wound #4 status is Open. Original cause of wound was Trauma. The wound is located on the Left,Lateral Malleolus. The wound measures 0.9cm length x 0.8cm width x 0.4cm depth; 0.565cm^2 area and 0.226cm^3 volume. There is Fat Layer (Subcutaneous Tissue) Exposed exposed. There is no tunneling or undermining noted. There is a medium amount of serous drainage noted. The wound margin is well defined and not attached to the wound base. There is medium (34-66%) pink, pale granulation within the wound bed. There is a medium (34-66%) amount of necrotic tissue within the wound bed including Adherent Slough. 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 There was a Excisional Skin/Subcutaneous Tissue Debridement with a total area of 0.72 sq cm performed by Ricard Dillon., MD. With the following instrument(s): Curette to remove Viable and Non-Viable tissue/material. Material removed includes Subcutaneous Tissue, Slough, Skin: Dermis, and Fibrin/Exudate after achieving pain control using Lidocaine 4% Topical Solution. A time out was conducted at 16:50, prior to the start of the procedure. A Minimum amount of bleeding was controlled with Pressure. The procedure was tolerated well with a pain level of 0 throughout and a pain level of 0 following the procedure. Post Debridement Measurements: 0.7cm length x 0.8cm width x 0.4cm depth; 0.176cm^3 volume. Character of Wound/Ulcer Post Debridement is improved. Severity of Tissue Post Debridement is: Fat layer exposed. Post procedure Diagnosis Wound #4: Same as Pre-Procedure Pre-procedure diagnosis of Wound #4 is a Venous Leg Ulcer located on the Left,Lateral Malleolus. A skin graft procedure using a bioengineered skin substitute/cellular or tissue based product was performed by Ricard Dillon., MD with the following instrument(s): Forceps and Scissors. Oasis wound matrix was applied and secured with Steri-Strips and adaptic. 5 sq cm of product was utilized and 5 sq cm was wasted due to wound size. Post Application, no dressing was applied. A Time Out was conducted at 16:55, prior to the start of the procedure. The procedure was tolerated well with a pain level of 1 throughout and a pain level of 3 following the procedure. Post procedure Diagnosis Wound #4: Same  as Pre-Procedure . Pre-procedure diagnosis of Wound #4 is a Venous Leg  Ulcer located on the Left,Lateral Malleolus . There was a Four Layer Compression Therapy Procedure by Baruch Gouty, RN. Post procedure Diagnosis Wound #4: Same as Pre-Procedure Plan Follow-up Appointments: Return Appointment in 1 week. - to see hoyt. Dressing Change Frequency: Wound #4 Left,Lateral Malleolus: Do not change entire dressing for one week. Skin Barriers/Peri-Wound Care: TCA Cream or Ointment - liberal to lower leg Wound Cleansing: Wound #4 Left,Lateral Malleolus: May shower with protection. Primary Wound Dressing: Wound #4 Left,Lateral Malleolus: Skin Substitute Application - Oasis Burn Matrix #3. secured with adaptic and steri-strips. leave in place. do not remove. Secondary Dressing: Wound #4 Left,Lateral Malleolus: Foam - foam donut. Dry Gauze Other: - pad the left lateral and anterior portion of lower leg. Edema Control: 4 layer compression: Left lower extremity - no kerlix no cotton. pad with foam to anterior portion of lower leg. Avoid standing for long periods of time Elevate legs to the level of the heart or above for 30 minutes daily and/or when sitting, a frequency of: - throughout the day Exercise regularly Radiology ordered were: X-ray, ankle - x-ray of left ankle related to non-healing ulcer looking for infection. CPT code 1. I continued with the Oasis. 2. I am going to x-ray the lateral ankle, specifically the left lateral malleolus 3. We padded up the area a little more in the compression wrap. He seems to be tolerating this. Electronic Signature(s) Signed: 04/22/2019 6:25:25 PM By: Linton Ham MD Entered By: Linton Ham on 04/22/2019 17:31:40 -------------------------------------------------------------------------------- SuperBill Details Patient Name: Date of Service: [name], [name] 04/22/2019 Medical Record [id number] Patient Account Number: [id number] Date of Birth/Sex: Treating RN: 05-16-1951 (68 y.o. 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, including hip Facility Procedures CPT4 Code Description: [id number] -Oasis (Wound Matrix) per sq cm Modifier: Quantity: 10 CPT4 Code Description: [id number] - SKIN SUB GRAFT TRNK/ARM/LEG ICD-10 Diagnosis Description [id number] Non-pressure chronic ulcer of left ankle limited to breakdown o I87.332 Chronic venous hypertension (idiopathic) with ulcer and inflamm extremity Modifier: f skin ation of left l Quantity: 1 ower Physician Procedures CPT4: Code [id number] Description: 30 - WC PHYS SKIN SUB GRAFT TRNK/ARM/LEG ICD-10 Diagnosis Description L97.321 Non-pressure chronic ulcer of left ankle limited to breakdown of I87.332 Chronic venous hypertension (idiopathic) with ulcer and inflamma extremity Modifier: skin tion of left l Quantity: 1 ower Electronic Signature(s) Signed: 04/23/2019 6:05:38 PM By: Linton Ham MD Signed: 04/26/2019 2:18:43 PM By: [name] RN, BSN Previous Signature: 04/22/2019 6:25:25 PM Version By: Linton Ham MD Entered By: [name] on 04/23/2019 08:53:59

## 2019-04-28 ENCOUNTER — Encounter (HOSPITAL_BASED_OUTPATIENT_CLINIC_OR_DEPARTMENT_OTHER): Payer: Medicare Other | Admitting: Physician Assistant

## 2019-04-28 ENCOUNTER — Other Ambulatory Visit: Payer: Self-pay

## 2019-04-28 DIAGNOSIS — I87332 Chronic venous hypertension (idiopathic) with ulcer and inflammation of left lower extremity: Secondary | ICD-10-CM | POA: Diagnosis not present

## 2019-04-28 NOTE — Progress Notes (Signed)
RHONALD, SAELENS ([id number]) Visit Report for 04/28/2019 Chief Complaint Document Details Patient Name: Date of Service: [name], [name] 04/28/2019 8:00 AM Medical Record [id number] Patient Account Number: [id number] Date of Birth/Sex: Treating RN: 08-04-1950 (68 y.o. M) Primary Care Provider: PATIENT, NO Other Clinician: Referring Provider: Treating Provider/Extender:Stone III, Hoyt None, Designated Weeks in Treatment: 22 Information Obtained from: Patient Chief Complaint He is here for evaluation of a multiple ulcers/wounds 11/20/2018; patient is here for review of wounds on his left lower extremity Electronic Signature(s) Signed: 04/28/2019 4:52:33 PM By: Worthy Keeler PA-C Entered By: Worthy Keeler on 04/28/2019 08:36:36 -------------------------------------------------------------------------------- Cellular or Tissue Based Product Details Patient Name: BETTYE, NIECE. Date of Service: 04/28/2019 8:00 AM Medical Record [id number] Patient Account Number: [id number] Date of Birth/Sex: [birth year] (68 y.o. M) Treating RN: Baruch Gouty Primary Care Provider: PATIENT, NO Other Clinician: Referring Provider: Treating Provider/Extender:Stone III, Margarita Grizzle None, Designated Weeks in Treatment: 22 Cellular or Tissue Based Wound #4 Left,Lateral Malleolus Product Type Applied to: Performed By: Physician Worthy Keeler, PA Cellular or Tissue Based Oasis wound matrix Product Type: Level of Consciousness (Pre- Awake and Alert procedure): Pre-procedure Yes - 09:00 Verification/Time Out Taken: Location: trunk / arms / legs Wound Size (sq cm): 0.49 Product Size (sq cm): 10 Waste Size (sq cm): 5 Waste Reason: wound size Amount of Product Applied (sq cm): 5 Instrument Used: Forceps, Scissors Lot #: [id number] Order #: 4 Expiration Date: 06/05/2020 Fenestrated: No Reconstituted: Yes Solution Type: saline Solution Amount: 2 ml Lot #: [id number] Solution  Expiration Date: 09/30/2020 Secured: Yes Secured With: Steri-Strips Dressing Applied: Yes Primary Dressing: adaptic, drawtex Procedural Pain: 0 Post Procedural Pain: 0 Response to Treatment: Procedure was tolerated well Level of Consciousness Awake and Alert (Post-procedure): Post Procedure Diagnosis Same as Pre-procedure Notes oasisi burn matrix Electronic Signature(s) Signed: 04/28/2019 3:14:28 PM By: Baruch Gouty RN, BSN Signed: 04/28/2019 4:52:33 PM By: Worthy Keeler PA-C Entered By: Baruch Gouty on 04/28/2019 09:08:11 -------------------------------------------------------------------------------- HPI Details Patient Name: Date of Service: [name] 04/28/2019 8:00 AM Medical Record [id number] Patient Account Number: [id number] Date of Birth/Sex: Treating RN: 11/30/1950 (68 y.o. M) Primary Care Provider: PATIENT, NO Other Clinician: Referring Provider: Treating Provider/Extender:Stone III, Hoyt None, Designated Weeks in Treatment: 22 History of Present Illness HPI Description: 08/22/17-He is here for initial evaluation of a right heel wound, right toe wound and left heel fissures. He states that he has chronic dry, flaking skin and frequently has cracked heels. He states the right heel wound developed as such but he developed the wound approximately 3 weeks ago when pulling some skin off; he was unaware of the right toe ulcer. He was treating the right heel with over-the-counter triple antibiotic ointment and peroxide. He presented to urgent care on 3/12 with a 2 x 2 centimeter ulceration to the right heel and was discharged with a 10 day prescription for Bactrim. He does have a history of neuropathy, diet controlled diabetic. His does not know his most recent A1c but will be seeing his PCP next week, there is no evidence of an A1c in Epic. He denies smoking, does have a history of alcohol use, he admits to 5+/- beers weekly not daily. He did  follow-up with cardiology on 3/18 with c/o ble edema, they ordered DVT study which he completed on 3/21. He reports that the bilateral DVT study was negative per the ultrasound technician, no report is available at this time. READMISSION 11/20/2018 this is a now 68 year old man who is  here for review of wounds on his lower extremity on the left. We note that he was here on a single visit in March he tells [place] that over the last 2019. His major issue is that he dropped a box and hit the outside of his left lateral malleolus about 3 weeks ago. This is left him with a nonhealing area. He has been using Iodosorb ointment he had apparently from the last stay in this clinic. Also of note over the last 6 weeks he has noted an area that is raised and will scab over but then he traumatizes a scab and then this bleeds and reopens. This is on the left posterior calf. All of this appears to be on the background of chronic venous insufficiency. He has had DVT studies in the past that were negative in 2019. He is also had arterial studies that showed an ABI on the right of 0.76 and on the left of 1.05. Past medical history; the patient states that he is not a diabetic although I note there is some suggested he was in the note from last year. He has a history of coronary artery disease. He tells me that he has had a history of skin cancer but he is not sure which one but that includes one on the left upper thigh. ABIs done previously 0.76 on the right and 1.05 on the left 6/26; patient readmitted to the clinic last week. He had a traumatic area over the left lateral malleolus as well as a hyper granulated growth on the left posterior calf. He also has severe chronic venous insufficiency. 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 We have been using Iodoflex to the traumatic wound under compression. 7/9; area over the left lateral malleolus continues to require  debridement and we are continuing with Iodoflex under compression. He has severe chronic venous insufficiency. 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 This patient has severe bilateral venous insufficiency and venous hypertension with skin changes resulting. He requires bilateral reflux studies Finally he traumatized his right lateral calf while doing yard work about a week ago. The area is small with a flap of skin over the majority of the wound but I am not sure this is going to remain viable 7/16; still not a viable surface on the left lateral malleolus in fact this wound is deeper. We have been using Iodoflex. 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 He has severe bilateral venous insufficiency and I have ordered reflux studies these apparently are booked for next week. Our intake nurse noted some yellowish-green drainage coming from the wound on the left ankle. Patient was insistent on talking about doing carotid ultrasounds and he was hoping to get these done with his reflux studies next week. I spent a few minutes talking to him about this I just could not put together in a logical way what he is concerned about. At the end of this I asked him to see his primary doctor about this he says he does not have a primary doctor advised him to get a primary doctor to go over this with him. It was not really clear where  the level of concern was specific to carotid ultrasound 7/23; a better looking surface on the left lateral malleolus although it still requires debridement. He tells me he has an appointment to Mohs surgery center next week. Small area on the right lateral calf. The patient went to have his reflux studies [facility] I do not think  they took his compression wraps off. He did have reflux on the left in the common femoral greater saphenous and saphenofemoral junction and the great saphenous vein in the proximal thigh. 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 Looking at the numbers it would appear that the maximal reflux was in the great saphenous vein in the proximal thigh. As usual I am not sure what would be possible to do here. I will send him to vascular surgery 7/30; the patient still requires debridement in the left lateral malleolus although in general the wound is still deep and and punched out. We are using Sorbact. The area on the right lateral calf is improved. 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 The patient does not have a vascular surgery appointment nor does he have a skin surgery center appointment. I am really not sure what the issues are here although I know vascular surgery as well behind. 8/6-Patient does not have a vascular appointment nor does he have a skin surgery appointment firmed up at this time although he says he is called them both, impressed upon him that he should see whoever is available for the vascular so the vein studies can be interpreted. 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 He is here for the left lateral malleoli wound for which we are using SOrbact, and he is disappointed the wound is not  healing 8/21; patient saw Dr. Doren Custard on 01/21/2019. Dr. Doren Custard was concerned about his arterial flow. Noting that he had trouble feeling the dorsalis pedis and posterior tibial arteries also noteworthy that the Doppler had a barely biphasic posterior tibial signal with a monophasic but fairly brisk dorsalis pedis signal. He is booked for an angiogram next Friday. We have been using 3 layer compression on him. 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 Given Dr. Mee Hives concern it was difficult for me to be comfortable with him going ahead with the cancer extraction and I have suggested canceling this. Finally he has an increase in the erythematous contact dermatitis in the entire wrapped area of the left leg which is probably an allergy to the contact layer of the 3 layer. 8/27; he is going for his angiogram tomorrow by Dr. Doren Custard. We cancel the Mohs surgery on the tumor on his posterior calf pending the angiogram tomorrow. If this shows adequate blood flow to this area I think we can rebook this. His main wound is on the left lateral malleolus a small refractory punched-out area.  We have been using Hydrofera Blue 9/3; his angiogram was done as scheduled. He does indeed have PAD. The common femoral, deep femoral superficial femoral and popliteal arteries will are all widely patent. 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 The overall thought was that he had enough adequate circulation to heal his venous ulcer and continue to use mild elevation and compression. Noted that he had biphasic posterior tibial signals with a Doppler ABI of 0.91. It was not felt that the stenosis in the proximal tibial artery would be associated with increased blood flow he  will follow-up with Dr. Doren Custard in 1 month He now needs to get the skin surgery appointment set up. The area is growing larger and bleeding frequently on the posterior left calf per the patient 02/11/19-Patient is back after 1 week, results of his vascular studies are noted, patient needs to have a skin surgery appointment established apparently he did not return the calls this week. The area on the posterior calf is where the skin surgery needs to be done, the left lateral malleoli are wound appears to be about the same we will using Hydrofera Blue change 9/24; patient finally has a booking to deal with the cancer on his posterior calf I believe with the skin surgery center. Not much change in the wound over the left lateral malleolus. It is been 2 weeks since he was here. I changed him to endoform last time 10/2; the patient has been to see dermatology. Scheduled for surgery on 10/14. Using endoform to the wound over the lateral malleolus some improvement in the wound condition but not the surface area. He has severe chronic venous insufficiency. He also has some arterial disease he was angiogram by Dr. Doren Custard. He did not think that anything needed to be done from an intervention point of view. 10/8; his surgery is still scheduled for 10/14. It is concerning about whether he is healed or maintained skin integrity and is sutured area given the severity of his chronic venous insufficiency. The area we are following is on the left lateral malleolus. Some improvement in the surface area. No debridement today we have been using endoform 10/15; the patient finally had his surgery for the tumor on his left posterior calf. We did not look at this today. Still a punched out area in the lower left malleolus. Perhaps some mild improvement we have been using endoform We were denied for Grafix and still have not heard back on Oasis 10/22; no real change in this wound. I am able to debride it to a healthy  looking surface but the next week there is recurrent debris and no improvement in depth. Punched out over the left lateral malleolus. We have not heard back on Oasis. We were denied for Grafix 11/6; wound is perhaps slightly smaller. Still punched out. He was approved for Oasis we applied Oasis #1. His surgical wound on the back of the left calf was reviewed by dermatology we are not reviewing this 11/13; I think the wound is filled in somewhat. Certainly a much more vibrant looking surface. Oasis #2. I looked at his surgical site on the back of the calf today for the first time. Clean incision small open areas 11/19; measurements not much different today. Still 0.4 cm in depth although post debridement  the wound surface looks better. Oasis #3 the site on the back of his calf looks satisfactory. I put him in 4 layer compression last week he seems to tolerate this. He is complaining of some tenderness around the wound. This was controlled swelling around the wound. He seems to have tolerated this 04/28/2019 on evaluation today patient appears to be doing slightly better based on measurements compared to last week. He is here for Oasis application #4 to the ankle region. Fortunately there is no signs of other wounds open at this point and no signs of infection. Electronic Signature(s) Signed: 04/28/2019 4:52:33 PM By: Worthy Keeler PA-C Entered By: Worthy Keeler on 04/28/2019 09:15:57 -------------------------------------------------------------------------------- Physical Exam Details Patient Name: Date of Service: KELVIN, LUNDE 04/28/2019 8:00 AM Medical Record [id number] Patient Account Number: [id number] Date of Birth/Sex: Treating RN: 1950/10/27 (68 y.o. M) Primary Care Provider: PATIENT, NO Other Clinician: Referring Provider: Treating Provider/Extender:Stone III, Hoyt None, Designated Weeks in Treatment: 30 Constitutional Well-nourished and well-hydrated in no acute  distress. Respiratory normal breathing without difficulty. Psychiatric this patient is able to make decisions and demonstrates good insight into disease process. Alert and Oriented x 3. pleasant and cooperative. Notes Patient's wound bed currently showed signs of good granulation in the base of the wound actually see a very good granulation bed. With that being said there does not appear to be any signs of active infection. No fevers, chills, nausea, vomiting, or diarrhea. I did not have to perform any sharp debridement today and I did apply the fourth application of Oasis to the wound bed. This was then packed in behind with a piece of drawtex and then covered with Adaptic and secured with Steri-Strips. The drawtex should help to keep the Oasis in contact with the base of the wound. Electronic Signature(s) Signed: 04/28/2019 4:52:33 PM By: Worthy Keeler PA-C Entered By: Worthy Keeler on 04/28/2019 09:16:37 -------------------------------------------------------------------------------- Physician Orders Details Patient Name: Date of Service: RODNIE, MIDDENDORF 04/28/2019 8:00 AM Medical Record [id number] Patient Account Number: [id number] Date of Birth/Sex: Treating RN: 03-25-1951 (68 y.o. [name] Primary Care Provider: PATIENT, NO Other Clinician: Referring Provider: Treating Provider/Extender:Stone III, Laverle Patter, Designated Weeks in Treatment: 22 Verbal / Phone Orders: No Diagnosis Coding ICD-10 Coding Code Description I87.332 Chronic venous hypertension (idiopathic) with ulcer and inflammation of left lower extremity L97.321 Non-pressure chronic ulcer of left ankle limited to breakdown of skin C44.799 Other specified malignant neoplasm of skin of left lower limb, including hip Follow-up Appointments Return Appointment in 1 week. - Thursday Dressing Change Frequency Wound #4 Left,Lateral Malleolus Do not change entire dressing for one week. Skin  Barriers/Peri-Wound Care TCA Cream or Ointment - liberal to lower leg Wound Cleansing Wound #4 Left,Lateral Malleolus May shower with protection. Primary Wound Dressing Wound #4 Left,Lateral Malleolus Skin Substitute Application - Oasis Burn Matrix #4. secured with adaptic and steri-strips. leave in place. do not remove. Secondary Dressing Wound #4 Left,Lateral Malleolus Foam - foam donut. Dry Gauze Drawtex Other: - pad the left lateral and anterior portion of lower leg. Edema Control 4 layer compression: Left lower extremity - no kerlix no cotton. pad with foam to anterior portion of lower leg. Avoid standing for long periods of time Elevate legs to the level of the heart or above for 30 minutes daily and/or when sitting, a frequency of: - throughout the day Exercise regularly Electronic Signature(s) Signed: 04/28/2019 3:14:28 PM By: Baruch Gouty RN, BSN Signed: 04/28/2019 4:52:33 PM By: Melburn Hake,  Hoyt PA-C Entered By: Baruch Gouty on 04/28/2019 09:09:35 -------------------------------------------------------------------------------- Problem List Details Patient Name: Date of Service: TAJINDER, DINGLEY 04/28/2019 8:00 AM Medical Record [id number] Patient Account Number: [id number] Date of Birth/Sex: Treating RN: 1951-05-06 (68 y.o. M) Primary Care Provider: PATIENT, NO Other Clinician: Referring Provider: Treating Provider/Extender:Stone III, Hoyt None, Designated Weeks in Treatment: 22 Active Problems ICD-10 Evaluated Encounter Code Description Active Date Today Diagnosis I87.332 Chronic venous hypertension (idiopathic) with ulcer 11/20/2018 No Yes and inflammation of left lower extremity L97.321 Non-pressure chronic ulcer of left ankle limited to 11/20/2018 No Yes breakdown of skin C44.799 Other specified malignant neoplasm of skin of left 11/27/2018 No Yes lower limb, including hip Inactive Problems ICD-10 Code Description Active Date Inactive  Date L97.211 Non-pressure chronic ulcer of right calf limited to breakdown of 12/10/2018 12/10/2018 skin Resolved Problems Electronic Signature(s) Signed: 04/28/2019 4:52:33 PM By: Worthy Keeler PA-C Entered By: Worthy Keeler on 04/28/2019 08:36:31 -------------------------------------------------------------------------------- Progress Note Details Patient Name: Date of Service: [name] 04/28/2019 8:00 AM Medical Record [id number] Patient Account Number: [id number] Date of Birth/Sex: Treating RN: 1950/08/03 (68 y.o. M) Primary Care Provider: PATIENT, NO Other Clinician: Referring Provider: Treating Provider/Extender:Stone III, Hoyt None, Designated Weeks in Treatment: 22 Subjective Chief Complaint Information obtained from Patient He is here for evaluation of a multiple ulcers/wounds 11/20/2018; patient is here for review of wounds on his left lower extremity History of Present Illness (HPI) 08/22/17-He is here for initial evaluation of a right heel wound, right toe wound and left heel fissures. He states that he has chronic dry, flaking skin and frequently has cracked heels. He states the right heel wound developed as such but he developed the wound approximately 3 weeks ago when pulling some skin off; he was unaware of the right toe ulcer. He was treating the right heel with over-the-counter triple antibiotic ointment and peroxide. He presented to urgent care on 3/12 with a 2 x 2 centimeter ulceration to the right heel and was discharged with a 10 day prescription for Bactrim. He does have a history of neuropathy, diet controlled diabetic. His does not know his most recent A1c but will be seeing his PCP next week, there is no evidence of an A1c in Epic. He denies smoking, does have a history of alcohol use, he admits to 5+/- beers weekly not daily. He did follow-up with cardiology on 3/18 with c/o ble edema, they ordered DVT study which he completed on 3/21. He  reports that the bilateral DVT study was negative per the ultrasound technician, no report is available at this time. READMISSION 11/20/2018 this is a now 68 year old man who is here for review of wounds on his lower extremity on the left. We note that he was here on a single visit in March he tells [place] that over the last 2019. His major issue is that he dropped a box and hit the outside of his left lateral malleolus about 3 weeks ago. This is left him with a nonhealing area. He has been using Iodosorb ointment he had apparently from the last stay in this clinic. Also of note over the last 6 weeks he has noted an area that is raised and will scab over but then he traumatizes a scab and then this bleeds and reopens. This is on the left posterior calf. All of this appears to be on the background of chronic venous insufficiency. He has had DVT studies in the past that were negative in 2019. He is also  had arterial studies that showed an ABI on the right of 0.76 and on the left of 1.05. Past medical history; the patient states that he is not a diabetic although I note there is some suggested he was in the note from last year. He has a history of coronary artery disease. He tells me that he has had a history of skin cancer but he is not sure which one but that includes one on the left upper thigh. ABIs done previously 0.76 on the right and 1.05 on the left 6/26; patient readmitted to the clinic last week. He had a traumatic area over the left lateral malleolus as well as a hyper granulated growth on the left posterior calf. He also has severe chronic venous insufficiency. 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 We have been using Iodoflex to the traumatic wound under compression. 7/9; area over the left lateral malleolus continues to require debridement and we are continuing with Iodoflex under compression. He has severe chronic venous insufficiency.  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 This patient has severe bilateral venous insufficiency and venous hypertension with skin changes resulting. He requires bilateral reflux studies Finally he traumatized his right lateral calf while doing yard work about a week ago. The area is small with a flap of skin over the majority of the wound but I am not sure this is going to remain viable 7/16; still not a viable surface on the left lateral malleolus in fact this wound is deeper. We have been using Iodoflex. 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 He has severe bilateral venous insufficiency and I have ordered reflux studies these apparently are booked for next week. Our intake nurse noted some yellowish-green drainage coming from the wound on the left ankle. Patient was insistent on talking about doing carotid ultrasounds and he was hoping to get these done with his reflux studies next week. I spent a few minutes talking to him about this I just could not put together in a logical way what he is concerned about. At the end of this I asked him to see his primary doctor about this he says he does not have a primary doctor advised him to get a primary doctor to go over this with him. It was not really clear where the level of concern was specific to carotid ultrasound 7/23; a better looking surface on the left lateral malleolus although it still requires debridement. He tells me he has an appointment to [facility] surgery center next week. Small area on the right lateral calf. The patient went to have [facility] I do not think they took his compression wraps off. He did have reflux on the left in the common femoral greater saphenous  and saphenofemoral junction and the great saphenous vein in the proximal thigh. 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 Looking at the numbers it would appear that the maximal reflux was in the great saphenous vein in the proximal thigh. As usual I am not sure  what would be possible to do here. I will send him to vascular surgery 7/30; the patient still requires debridement in the left lateral malleolus although in general the wound is still deep and and punched out. We are using Sorbact. The area on the right lateral calf is improved. 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 The patient does not have a vascular surgery appointment nor does he have a skin surgery center appointment. I am really not sure what the issues are here although I know vascular surgery as well behind. 8/6-Patient does not have a vascular appointment nor does he have a skin surgery appointment firmed up at this time although he says he is called them both, impressed upon him that he should see whoever is available for the vascular so the vein studies can be interpreted. 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 He is here for the left lateral malleoli wound for which we are using SOrbact, and he is disappointed the wound is not healing 8/21; patient saw Dr. Doren Custard on 01/21/2019. Dr. Doren Custard was concerned about his arterial flow. Noting that he  had trouble feeling the dorsalis pedis and posterior tibial arteries also noteworthy that the Doppler had a barely biphasic posterior tibial signal with a monophasic but fairly brisk dorsalis pedis signal. He is booked for an angiogram next Friday. We have been using 3 layer compression on him. 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 Given Dr. Mee Hives concern it was difficult for me to be comfortable with him going ahead with the cancer extraction and I have suggested canceling this. Finally he has an increase in the erythematous contact dermatitis in the entire wrapped area of the left leg which is probably an allergy to the contact layer of the 3 layer. 8/27; he is going for his angiogram tomorrow by Dr. Doren Custard. We cancel the Mohs surgery on the tumor on his posterior calf pending the angiogram tomorrow. If this shows adequate blood flow to this area I think we can rebook this. His main wound is on the left lateral malleolus a small refractory punched-out area. We have been using Hydrofera Blue 9/3; his angiogram was done as scheduled. He does indeed have PAD. The common femoral, deep femoral superficial femoral and popliteal arteries will are all widely patent. 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 The overall thought was that he had enough adequate circulation to heal his venous ulcer and continue to use mild elevation and compression. Noted that he had biphasic posterior tibial signals with a Doppler ABI of 0.91. It was not felt that the stenosis in the proximal tibial artery would be associated with increased blood flow he will follow-up with Dr. Doren Custard in 1 month He now needs to get the skin  surgery appointment set up. The area is  growing larger and bleeding frequently on the posterior left calf per the patient 02/11/19-Patient is back after 1 week, results of his vascular studies are noted, patient needs to have a skin surgery appointment established apparently he did not return the calls this week. The area on the posterior calf is where the skin surgery needs to be done, the left lateral malleoli are wound appears to be about the same we will using Hydrofera Blue change 9/24; patient finally has a booking to deal with the cancer on his posterior calf I believe with the skin surgery center. Not much change in the wound over the left lateral malleolus. It is been 2 weeks since he was here. I changed him to endoform last time 10/2; the patient has been to see dermatology. Scheduled for surgery on 10/14. Using endoform to the wound over the lateral malleolus some improvement in the wound condition but not the surface area. He has severe chronic venous insufficiency. He also has some arterial disease he was angiogram by Dr. Doren Custard. He did not think that anything needed to be done from an intervention point of view. 10/8; his surgery is still scheduled for 10/14. It is concerning about whether he is healed or maintained skin integrity and is sutured area given the severity of his chronic venous insufficiency. The area we are following is on the left lateral malleolus. Some improvement in the surface area. No debridement today we have been using endoform 10/15; the patient finally had his surgery for the tumor on his left posterior calf. We did not look at this today. Still a punched out area in the lower left malleolus. Perhaps some mild improvement we have been using endoform We were denied for Grafix and still have not heard back on Oasis 10/22; no real change in this wound. I am able to debride it to a healthy looking surface but the next week there is recurrent debris and no improvement in depth. Punched out over the left  lateral malleolus. We have not heard back on Oasis. We were denied for Grafix 11/6; wound is perhaps slightly smaller. Still punched out. He was approved for Oasis we applied Oasis #1. His surgical wound on the back of the left calf was reviewed by dermatology we are not reviewing this 11/13; I think the wound is filled in somewhat. Certainly a much more vibrant looking surface. Oasis #2. I looked at his surgical site on the back of the calf today for the first time. Clean incision small open areas 11/19; measurements not much different today. Still 0.4 cm in depth although post debridement the wound surface looks better. Oasis #3 the site on the back of his calf looks satisfactory. I put him in 4 layer compression last week he seems to tolerate this. He is complaining of some tenderness around the wound. This was controlled swelling around the wound. He seems to have tolerated this 04/28/2019 on evaluation today patient appears to be doing slightly better based on measurements compared to last week. He is here for Oasis application #4 to the ankle region. Fortunately there is no signs of other wounds open at this point and no signs of infection. Patient History Unable to Obtain Patient History due to Altered Mental Status. Information obtained from Patient. Family History Heart Disease - Father,Paternal Grandparents, Hypertension - Father, Stroke - Maternal Grandparents, No family history of Cancer, Diabetes, Hereditary Spherocytosis, Kidney Disease, Lung Disease, Seizures, Thyroid Problems, Tuberculosis. Social  History Never smoker, Marital Status - Married, Alcohol Use - Moderate, Drug Use - No History, Caffeine Use - Moderate. Medical History Eyes Denies history of Cataracts, Glaucoma, Optic Neuritis Ear/Nose/Mouth/Throat Denies history of Chronic sinus problems/congestion, Middle ear problems Hematologic/Lymphatic Denies history of Anemia, Hemophilia, Human Immunodeficiency Virus,  Lymphedema, Sickle Cell Disease Respiratory Denies history of Aspiration, Asthma, Chronic Obstructive Pulmonary Disease (COPD), Pneumothorax, Sleep Apnea, Tuberculosis Cardiovascular Patient has history of Arrhythmia, Coronary Artery Disease Denies history of Angina, Congestive Heart Failure, Deep Vein Thrombosis, Hypertension, Hypotension, Myocardial Infarction, Peripheral Arterial Disease, Peripheral Venous Disease, Phlebitis, Vasculitis Gastrointestinal Denies history of Cirrhosis , Colitis, Crohnoos, Hepatitis A, Hepatitis B, Hepatitis C Endocrine Patient has history of Type II Diabetes Denies history of Type I Diabetes Genitourinary Denies history of End Stage Renal Disease Immunological Denies history of Lupus Erythematosus, Raynaudoos, Scleroderma Integumentary (Skin) Denies history of History of Burn Musculoskeletal Denies history of Gout, Rheumatoid Arthritis, Osteoarthritis, Osteomyelitis Neurologic Denies history of Dementia, Neuropathy, Quadriplegia, Paraplegia, Seizure Disorder Oncologic Denies history of Received Chemotherapy, Received Radiation Psychiatric Denies history of Anorexia/bulimia, Confinement Anxiety Hospitalization/Surgery History - coronary artery bypass. Review of Systems (ROS) Constitutional Symptoms (General Health) Denies complaints or symptoms of Fatigue, Fever, Chills, Marked Weight Change. Respiratory Denies complaints or symptoms of Chronic or frequent coughs, Shortness of Breath. Cardiovascular Denies complaints or symptoms of Chest pain. Psychiatric Denies complaints or symptoms of Claustrophobia, Suicidal. Objective Constitutional Well-nourished and well-hydrated in no acute distress. Vitals Time Taken: 8:25 AM, Height: 73 in, Weight: 205 lbs, BMI: 27, Temperature: 97.9 F, Pulse: 76 bpm, Respiratory Rate: 18 breaths/min, Blood Pressure: 189/97 mmHg. Respiratory normal breathing without difficulty. Psychiatric this patient is able  to make decisions and demonstrates good insight into disease process. Alert and Oriented x 3. pleasant and cooperative. General Notes: Patient's wound bed currently showed signs of good granulation in the base of the wound actually see a very good granulation bed. With that being said there does not appear to be any signs of active infection. No fevers, chills, nausea, vomiting, or diarrhea. I did not have to perform any sharp debridement today and I did apply the fourth application of Oasis to the wound bed. This was then packed in behind with a piece of drawtex and then covered with Adaptic and secured with Steri-Strips. The drawtex should help to keep the Oasis in contact with the base of the wound. Integumentary (Hair, Skin) Wound #4 status is Open. Original cause of wound was Trauma. The wound is located on the Left,Lateral Malleolus. The wound measures 0.7cm length x 0.7cm width x 0.6cm depth; 0.385cm^2 area and 0.231cm^3 volume. There is Fat Layer (Subcutaneous Tissue) Exposed exposed. There is no tunneling or undermining noted. There is a medium amount of serous drainage noted. The wound margin is well defined and not attached to the wound base. There is medium (34-66%) pink, pale granulation within the wound bed. There is a medium (34-66%) amount of necrotic tissue within the wound bed including Adherent Slough. 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 A skin graft procedure using a bioengineered skin substitute/cellular or tissue based product was performed by Worthy Keeler, PA with the following instrument(s): Forceps and Scissors.  Oasis wound matrix  was applied and secured with Steri- Strips. 5 sq cm of product was utilized and 5 sq cm was wasted due to wound size. Post Application, adaptic, drawtex was applied. A Time Out was conducted at 09:00, prior to the start of the procedure. The procedure was tolerated well with a pain level of 0 throughout and a pain level of 0 following the procedure. Post procedure Diagnosis Wound #4: Same as Pre-Procedure General Notes: oasisi burn matrix. Pre-procedure diagnosis of Wound #4 is a Venous Leg Ulcer located on the Left,Lateral Malleolus . There was a Three Layer Compression Therapy Procedure by [name], RN. Post procedure Diagnosis Wound #4: Same as Pre-Procedure Plan Follow-up Appointments: Return Appointment in 1 week. - Thursday Dressing Change Frequency: Wound #4 Left,Lateral Malleolus: Do not change entire dressing for one week. Skin Barriers/Peri-Wound Care: TCA Cream or Ointment - liberal to lower leg Wound Cleansing: Wound #4 Left,Lateral Malleolus: May shower with protection. Primary Wound Dressing: Wound #4 Left,Lateral Malleolus: Skin Substitute Application - Oasis Burn Matrix #4. secured with adaptic and steri-strips. leave in place. do not remove. Secondary Dressing: Wound #4 Left,Lateral Malleolus: Foam - foam donut. Dry Gauze Drawtex Other: - pad the left lateral and anterior portion of lower leg. Edema Control: 4 layer compression: Left lower extremity - no kerlix no cotton. pad with foam to anterior portion of lower leg. Avoid standing for long periods of time Elevate legs to the level of the heart or above for 30 minutes daily and/or when sitting, a frequency of: - throughout the day Exercise regularly 1 my suggestion at this time is good to be that we go ahead and continue with the current wound care measures including the Oasis which again the patient will leave in place until he comes in next week. 2. I am in a suggest as well we  continue with the 4 layer compression wrap I still think that is beneficial for him at this time. 3. Also get a recommend that he still should get the x-ray of the left ankle. With that being said he wants to see if we can order an x-ray for the left knee as well he sees Dr. Reynaldo Minium at [facility] here in [place]. With that being said I explained this is somewhat out of the scope of wound care I will leave that decision to Dr. Dellia Nims as to whether he has that to the order for the ankle x-ray or not. We will see patient back for reevaluation in 1 week here in the clinic. If anything worsens or changes patient will contact our office for additional recommendations. Electronic Signature(s) Signed: 04/28/2019 4:52:33 PM By: Worthy Keeler PA-C Entered By: Worthy Keeler on 04/28/2019 09:17:25 -------------------------------------------------------------------------------- HxROS Details Patient Name: Date of Service: WASIF, BURRUEL 04/28/2019 8:00 AM Medical Record [id number] Patient Account Number: [id number] Date of Birth/Sex: Treating RN: 1950-12-19 (68 y.o. M) Primary Care Provider: PATIENT, NO Other Clinician: Referring Provider: Treating Provider/Extender:Stone III, Hoyt None, Designated Weeks in Treatment: 22 Unable to Obtain Patient History due to Altered Mental Status Information Obtained From Patient Constitutional Symptoms (General Health) Complaints and Symptoms: Negative for: Fatigue; Fever; Chills; Marked Weight Change Respiratory Complaints and Symptoms: Negative for: Chronic or frequent coughs; Shortness of Breath Medical History: Negative for: Aspiration; Asthma; Chronic Obstructive Pulmonary Disease (COPD); Pneumothorax; Sleep Apnea; Tuberculosis Cardiovascular Complaints and Symptoms: Negative for: Chest pain Medical History: Positive for: Arrhythmia; Coronary Artery Disease Negative for: Angina; Congestive Heart Failure; Deep Vein Thrombosis;  Hypertension; Hypotension; Myocardial  Infarction; Peripheral Arterial Disease; Peripheral Venous Disease; Phlebitis; Vasculitis Psychiatric Complaints and Symptoms: Negative for: Claustrophobia; Suicidal Medical History: Negative for: Anorexia/bulimia; Confinement Anxiety Eyes Medical History: Negative for: Cataracts; Glaucoma; Optic Neuritis Ear/Nose/Mouth/Throat Medical History: Negative for: Chronic sinus problems/congestion; Middle ear problems Hematologic/Lymphatic Medical History: Negative for: Anemia; Hemophilia; Human Immunodeficiency Virus; Lymphedema; Sickle Cell Disease Gastrointestinal Medical History: Negative for: Cirrhosis ; Colitis; Crohns; Hepatitis A; Hepatitis B; Hepatitis C Endocrine Medical History: Positive for: Type II Diabetes Negative for: Type I Diabetes Time with diabetes: 8 yrs ago Treated with: Diet Blood sugar tested every day: No Genitourinary Medical History: Negative for: End Stage Renal Disease Immunological Medical History: Negative for: Lupus Erythematosus; Raynauds; Scleroderma Integumentary (Skin) Medical History: Negative for: History of Burn Musculoskeletal Medical History: Negative for: Gout; Rheumatoid Arthritis; Osteoarthritis; Osteomyelitis Neurologic Medical History: Negative for: Dementia; Neuropathy; Quadriplegia; Paraplegia; Seizure Disorder Oncologic Medical History: Negative for: Received Chemotherapy; Received Radiation Immunizations Pneumococcal Vaccine: Received Pneumococcal Vaccination: Yes Immunization Notes: does not remember when he last had a tetanus shot Implantable Devices None Hospitalization / Surgery History Type of Hospitalization/Surgery coronary artery bypass Family and Social History Cancer: No; Diabetes: No; Heart Disease: Yes - Father,Paternal Grandparents; Hereditary Spherocytosis: No; Hypertension: Yes - Father; Kidney Disease: No; Lung Disease: No; Seizures: No; Stroke: Yes -  Maternal Grandparents; Thyroid Problems: No; Tuberculosis: No; Never smoker; Marital Status - Married; Alcohol Use: Moderate; Drug Use: No History; Caffeine Use: Moderate; Financial Concerns: No; Food, Clothing or Shelter Needs: No; Support System Lacking: No; Transportation Concerns: No Physician Affirmation I have reviewed and agree with the above information. Electronic Signature(s) Signed: 04/28/2019 4:52:33 PM By: Worthy Keeler PA-C Entered By: Worthy Keeler on 04/28/2019 09:16:18 -------------------------------------------------------------------------------- SuperBill Details Patient Name: Date of Service: KIX, GREENHAGEN 04/28/2019 Medical Record [id number] Patient Account Number: [id number] Date of Birth/Sex: Treating RN: 01/18/1951 (68 y.o. 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, including hip Facility Procedures CPT4: Code [id number] Description: 23 - SKIN SUB GRAFT TRNK/ARM/LEG ICD-10 Diagnosis Description L97.321 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

## 2019-04-28 NOTE — Progress Notes (Addendum)
ARNOLD, KESTER ([id number]) Visit Report for 04/28/2019 Arrival Information Details Patient Name: Date of Service: [name], [name] 04/28/2019 8:00 AM Medical Record [id number] Patient Account Number: [id number] Date of Birth/Sex: Treating RN: [birth year] (68 y.o. [name] Primary Care Shayn Madole: PATIENT, NO Other Clinician: Referring Khris Jansson: Treating Tiyona Desouza/Extender:Stone III, Margarita Grizzle None, Designated Weeks in Treatment: 22 Visit Information History Since Last Visit Added or deleted any medications: No Patient Arrived: [name] Any new allergies or adverse reactions: No Arrival Time: 08:27 Had a fall or experienced change in No Accompanied By: self activities of daily living that may affect Transfer Assistance: None risk of falls: Patient Identification Verified: Yes Signs or symptoms of abuse/neglect since last No Secondary Verification Process Yes visito Completed: Hospitalized since last visit: No Patient Requires Transmission- No Implantable device outside of the clinic excluding No Based Precautions: cellular tissue based products placed in the center Patient Has Alerts: Yes ABIs 08/2018 L1.05 since last visit: Patient Alerts: Has Dressing in Place as Prescribed: Yes R0.76 Has Compression in Place as Prescribed: Yes Pain Present Now: No Electronic Signature(s) Signed: 04/28/2019 3:17:43 PM By: [name] Entered By: [name] on 04/28/2019 08:27:51 -------------------------------------------------------------------------------- Compression Therapy Details Patient Name: Date of Service: Michael Post F. 04/28/2019 8:00 AM Medical Record [id number] Patient Account Number: [id number] Date of Birth/Sex: Treating RN: Sep 21, 1950 (68 y.o. [name] Primary Care Tasheem Elms: PATIENT, NO Other Clinician: Referring Trista Ciocca: Treating Ilean Spradlin/Extender:Stone III, Margarita Grizzle None, Designated Weeks in Treatment: 22 Compression  Therapy Performed for Wound Wound #4 Left,Lateral Malleolus Assessment: Performed By: Clinician [name], RN Compression Type: Three Layer Post Procedure Diagnosis Same as Pre-procedure Electronic Signature(s) Signed: 04/28/2019 3:14:28 PM By: Baruch Gouty RN, BSN Entered By: Baruch Gouty on 04/28/2019 08:59:45 -------------------------------------------------------------------------------- Encounter Discharge Information Details Patient Name: Date of Service: [name], [name] 04/28/2019 8:00 AM Medical Record [id number] Patient Account Number: [id number] Date of Birth/Sex: Treating RN: 09-10-1950 (68 y.o. [name] Primary Care Casimer Russett: PATIENT, NO Other Clinician: Referring Samad Thon: Treating Lawana Hartzell/Extender:Stone III, Margarita Grizzle None, Designated Weeks in Treatment: 22 Encounter Discharge Information Items Post Procedure Vitals Discharge Condition: Stable Temperature (F): 97.9 Ambulatory Status: Cane Pulse (bpm): 76 Discharge Destination: Home Respiratory Rate (breaths/min): 18 Transportation: Private Auto Blood Pressure (mmHg): 189/97 Accompanied By: self Schedule Follow-up Appointment: Yes Clinical Summary of Care: Patient Declined Electronic Signature(s) Signed: 04/28/2019 3:17:43 PM By: [name] Entered By: [name] on 04/28/2019 09:22:07 -------------------------------------------------------------------------------- Lower Extremity Assessment Details Patient Name: Date of Service: [name], [name] 04/28/2019 8:00 AM Medical Record [id number] Patient Account Number: [id number] Date of Birth/Sex: Treating RN: 1950/07/17 (68 y.o. [name] Primary Care Perl Kerney: PATIENT, NO Other Clinician: Referring Merion Grimaldo: Treating Ramere Downs/Extender:Stone III, Margarita Grizzle None, Designated Weeks in Treatment: 22 Edema Assessment Assessed: [Left: No] [Right: No] Edema: [Left: No] [Right: No] Calf Left: Right: Point of  Measurement: 35 cm From Medial Instep 32.2 cm cm Ankle Left: Right: Point of Measurement: 9 cm From Medial Instep 23.2 cm cm Vascular Assessment Pulses: Dorsalis Pedis Palpable: [Left:Yes] Electronic Signature(s) Signed: 04/28/2019 3:17:43 PM By: [name] Entered By: [name] on 04/28/2019 08:31:44 -------------------------------------------------------------------------------- Multi-Disciplinary Care Plan Details Patient Name: Date of Service: Michael Post F. 04/28/2019 8:00 AM Medical Record [id number] Patient Account Number: [id number] Date of Birth/Sex: Treating RN: 06/12/1950 (68 y.o. [name] Primary Care Eloisa Chokshi: PATIENT, NO Other Clinician: Referring Neita Landrigan: Treating Shenicka Sunderlin/Extender:Stone III, Margarita Grizzle None, Designated Weeks in Treatment: 22 Active Inactive Venous Leg Ulcer Nursing Diagnoses: Knowledge deficit related to disease process and management Potential for venous  Insuffiency (use before diagnosis confirmed) Goals: Patient will maintain optimal edema control Date Initiated: 11/20/2018 Target Resolution Date: 05/07/2019 Goal Status: Active Patient/caregiver will verbalize understanding of disease process and disease management Date Initiated: 11/20/2018 Date Inactivated: 12/17/2018 Target Resolution Date: 12/18/2018 Goal Status: Met Interventions: Assess peripheral edema status every visit. Compression as ordered Provide education on venous insufficiency Treatment Activities: Therapeutic compression applied : 11/20/2018 Notes: Electronic Signature(s) Signed: 04/28/2019 3:14:28 PM By: Baruch Gouty RN, BSN Entered By: Baruch Gouty on 04/28/2019 08:55:47 -------------------------------------------------------------------------------- Pain Assessment Details Patient Name: Date of Service: Michael Post F. 04/28/2019 8:00 AM Medical Record [id number] Patient Account Number: [id number] Date of  Birth/Sex: Treating RN: 04-Dec-1950 (68 y.o. [name] Primary Care Milta Croson: PATIENT, NO Other Clinician: Referring Adriann Thau: Treating Yaritza Leist/Extender:Stone III, Margarita Grizzle None, Designated Weeks in Treatment: 22 Active Problems Location of Pain Severity and Description of Pain Patient Has Paino No Site Locations Pain Management and Medication Current Pain Management: Electronic Signature(s) Signed: 04/28/2019 3:17:43 PM By: [name] Entered By: [name] on 04/28/2019 08:31:32 -------------------------------------------------------------------------------- Patient/Caregiver Education Details Michael Post 11/25/2020andnbsp8:00 Patient Name: Date of Service: F. AM Medical Record Patient Account Number: [id number] Medical Record Patient Account Number: [id number] Number: Treating RN: Baruch Gouty Date of Birth/Gender: 02/23/1951 (68 y.o. M) Other Clinician: Primary Care Physician: PATIENT, NO Treating Worthy Keeler Referring Physician: Physician/Extender: None, Designated Weeks in Treatment: 22 Education Assessment Education Provided To: Patient Education Topics Provided Venous: Methods: Explain/Verbal Responses: Reinforcements needed, State content correctly [employer]) Signed: 04/28/2019 3:14:28 PM By: Baruch Gouty RN, BSN Entered By: Baruch Gouty on 04/28/2019 08:56:05 -------------------------------------------------------------------------------- Wound Assessment Details Patient Name: Date of Service: Michael Post F. 04/28/2019 8:00 AM Medical Record [id number] Patient Account Number: [id number] Date of Birth/Sex: Treating RN: May 17, 1951 (68 y.o. [name] Primary Care Livy Ross: PATIENT, NO Other Clinician: Referring Komal Stangelo: Treating Tonnya Garbett/Extender:Stone III, Margarita Grizzle None, Designated Weeks in Treatment: 22 Wound Status Wound Number: 4 Primary Venous Leg Ulcer Etiology: Wound  Location: Left Malleolus - Lateral Wound Open Wounding Event: Trauma Status: Date Acquired: 11/02/2018 Comorbid Arrhythmia, Coronary Artery Disease, Weeks Of Treatment: 22 History: Type II Diabetes Clustered Wound: No Photos Wound Measurements Length: (cm) 0.7 % Reductio Width: (cm) 0.7 % Redu Depth: (cm) 0.6 Epithe Area: (cm) 0.385 Tunne Volume: (cm) 0.231 Under Wound Description Classification: Full Thickness Without Exposed Support Benton Wound Well defined, not attached Margin: Exudate Medium Amount: Exudate Serous Type: Exudate amber Color: Wound Bed Granulation Amount: Medium (34-66%) Granulation Quality: Pink, Pale Fascia Necrotic Amount: Medium (34-66%) Fat La Necrotic Quality: Adherent Slough Tendon Muscle Joint Bone E dor After Cleansing: No /Fibrino Yes Exposed Structure Exposed: No yer (Subcutaneous Tissue) Exposed: Yes Exposed: No Exposed: No Exposed: No xposed: No n in Area: 70.3% ction in Volume: -77.7% lialization: None ling: No mining: No Treatment Notes Wound #4 (Left, Lateral Malleolus) 1. Cleanse With Wound Cleanser Soap and water 2. Periwound Care TCA Cream 3. Primary Dressing Applied Cellular Based Tissue Product 4. Secondary Dressing Other secondary dressing (specify in notes) Drawtex 6. Support Layer Applied 4 layer compression wrap Notes oasis burn covered with adaptic and secured with steristrips. no kerlix and no cotton Electronic Signature(s) Signed: 05/03/2019 4:19:23 PM By: Mikeal Hawthorne EMT/HBOT Signed: 05/04/2019 7:10:15 AM By: [name] Previous Signature: 04/28/2019 3:17:43 PM Version By: [name] Entered By: Mikeal Hawthorne on 05/03/2019 10:40:47 -------------------------------------------------------------------------------- Vitals Details Patient Name: Date of Service: Michael Post F. 04/28/2019 8:00 AM Medical Record [id number] Patient Account Number:  [id number] Date of  Birth/Sex: Treating RN: Jun 15, 1950 (68 y.o. [name] Primary Care Taylor Levick: PATIENT, NO Other Clinician: Referring Jaydin Jalomo: Treating Kalev Temme/Extender:Stone III, Margarita Grizzle None, Designated Weeks in Treatment: 22 Vital Signs Time Taken: 08:25 Temperature (F): 97.9 Height (in): 73 Pulse (bpm): 76 Weight (lbs): 205 Respiratory Rate (breaths/min): 18 Body Mass Index (BMI): 27 Blood Pressure (mmHg): 189/97 Reference Range: 80 - 120 mg / dl Electronic Signature(s) Signed: 04/28/2019 3:17:43 PM By: [name] Entered By: [name] on 04/28/2019 08:28:15

## 2019-05-05 ENCOUNTER — Encounter (HOSPITAL_BASED_OUTPATIENT_CLINIC_OR_DEPARTMENT_OTHER): Payer: Medicare Other | Admitting: Physician Assistant

## 2019-05-06 ENCOUNTER — Encounter (HOSPITAL_BASED_OUTPATIENT_CLINIC_OR_DEPARTMENT_OTHER): Payer: Medicare Other | Attending: Internal Medicine | Admitting: Internal Medicine

## 2019-05-06 ENCOUNTER — Other Ambulatory Visit: Payer: Self-pay

## 2019-05-06 DIAGNOSIS — Z823 Family history of stroke: Secondary | ICD-10-CM | POA: Insufficient documentation

## 2019-05-06 DIAGNOSIS — E1140 Type 2 diabetes mellitus with diabetic neuropathy, unspecified: Secondary | ICD-10-CM | POA: Diagnosis not present

## 2019-05-06 DIAGNOSIS — L97211 Non-pressure chronic ulcer of right calf limited to breakdown of skin: Secondary | ICD-10-CM | POA: Diagnosis not present

## 2019-05-06 DIAGNOSIS — Z8249 Family history of ischemic heart disease and other diseases of the circulatory system: Secondary | ICD-10-CM | POA: Diagnosis not present

## 2019-05-06 DIAGNOSIS — E1151 Type 2 diabetes mellitus with diabetic peripheral angiopathy without gangrene: Secondary | ICD-10-CM | POA: Diagnosis not present

## 2019-05-06 DIAGNOSIS — I2510 Atherosclerotic heart disease of native coronary artery without angina pectoris: Secondary | ICD-10-CM | POA: Insufficient documentation

## 2019-05-06 DIAGNOSIS — E11621 Type 2 diabetes mellitus with foot ulcer: Secondary | ICD-10-CM | POA: Insufficient documentation

## 2019-05-06 DIAGNOSIS — Z888 Allergy status to other drugs, medicaments and biological substances status: Secondary | ICD-10-CM | POA: Insufficient documentation

## 2019-05-06 DIAGNOSIS — I87311 Chronic venous hypertension (idiopathic) with ulcer of right lower extremity: Secondary | ICD-10-CM | POA: Insufficient documentation

## 2019-05-06 DIAGNOSIS — L97321 Non-pressure chronic ulcer of left ankle limited to breakdown of skin: Secondary | ICD-10-CM | POA: Insufficient documentation

## 2019-05-06 DIAGNOSIS — Z85828 Personal history of other malignant neoplasm of skin: Secondary | ICD-10-CM | POA: Diagnosis not present

## 2019-05-07 NOTE — Progress Notes (Signed)
[name], [name] ([id number]) Visit Report for 05/06/2019 Cellular or Tissue Based Product Details Patient Name: Date of Service: [name], [name] 05/06/2019 3:45 PM Medical Record [id number] Patient Account Number: [id number] Date of Birth/Sex: Treating RN: 16-Jun-1950 (68 y.o. [name] Primary Care Provider: PATIENT, NO Other Clinician: Referring Provider: Treating Provider/Extender:Robson, [name], Designated Weeks in Treatment: 23 Cellular or Tissue Based Wound #4 Left,Lateral Malleolus Product Type Applied to: Performed By: Physician Ricard Dillon., MD Cellular or Tissue Based Oasis wound matrix Product Type: Level of Consciousness (Pre- Awake and Alert procedure): Pre-procedure Yes - 17:29 Verification/Time Out Taken: Location: trunk / arms / legs Wound Size (sq cm): 0.64 Product Size (sq cm): 10 Waste Size (sq cm): 3 Waste Reason: wound size Amount of Product Applied (sq cm): 7 Instrument Used: Forceps, Scissors Lot #: [id number] Order #: [id number] Expiration Date: 06/05/2020 Fenestrated: No Reconstituted: Yes Solution Type: normal saline Solution Amount: 40mL Lot #: [id number] Expiration Date: 09/30/2020 Secured: Yes Secured With: Steri-Strips, adaptic Dressing Applied: No Procedural Pain: 0 Post Procedural Pain: 0 Response to Treatment: Procedure was tolerated well Level of Consciousness Awake and Alert (Post-procedure): Post Procedure Diagnosis Same as Pre-procedure Electronic Signature(s) Signed: 05/07/2019 7:57:15 AM By: Linton Ham MD Entered By: Linton Ham on 05/06/2019 18:02:54 -------------------------------------------------------------------------------- Debridement Details Patient Name: Date of Service: [name] 05/06/2019 3:45 PM Medical Record [id number] Patient Account Number: [id number] Date of Birth/Sex: Treating RN: 07/31/1950 (68 y.o. [name] Primary Care Provider: PATIENT, NO Other  Clinician: Referring Provider: Treating Provider/Extender:Robson, [name], Designated Weeks in Treatment: 23 Debridement Performed for Wound #4 Left,Lateral Malleolus Assessment: Performed By: Physician Ricard Dillon., MD Debridement Type: Debridement Severity of Tissue Pre Fat layer exposed Debridement: Level of Consciousness (Pre- Awake and Alert procedure): Pre-procedure Yes - 17:25 Verification/Time Out Taken: Start Time: 17:26 Pain Control: Lidocaine 4% Topical Solution Total Area Debrided (L x W): 0.8 (cm) x 0.8 (cm) = 0.64 (cm) Tissue and other material Viable, Non-Viable, Slough, Subcutaneous, Skin: Dermis , Fibrin/Exudate, Slough debrided: Level: Skin/Subcutaneous Tissue Debridement Description: Excisional Instrument: Curette Bleeding: None Hemostasis Achieved: Pressure End Time: 17:29 Procedural Pain: 0 Post Procedural Pain: 0 Response to Treatment: Procedure was tolerated well Level of Consciousness Awake and Alert (Post-procedure): Post Debridement Measurements of Total Wound Length: (cm) 0.8 Width: (cm) 0.8 Depth: (cm) 0.6 Volume: (cm) 0.302 Character of Wound/Ulcer Post Improved Debridement: Severity of Tissue Post Debridement: Fat layer exposed Post Procedure Diagnosis Same as Pre-procedure Electronic Signature(s) Signed: 05/06/2019 6:37:14 PM By: [name] Signed: 05/07/2019 7:57:15 AM By: Linton Ham MD Entered By: [name] on 05/06/2019 17:39:15 -------------------------------------------------------------------------------- HPI Details Patient Name: Date of Service: [name], [name] 05/06/2019 3:45 PM Medical Record [id number] Patient Account Number: [id number] Date of Birth/Sex: Treating RN: [birth year] (68 y.o. [name] Primary Care Provider: PATIENT, NO Other Clinician: Referring Provider: Treating Provider/Extender:Robson, [name], Designated Weeks in Treatment: 23 History of Present  Illness HPI Description: 08/22/17-He is here for initial evaluation of a right heel wound, right toe wound and left heel fissures. He states that he has chronic dry, flaking skin and frequently has cracked heels. He states the right heel wound developed as such but he developed the wound approximately 3 weeks ago when pulling some skin off; he was unaware of the right toe ulcer. He was treating the right heel with over-the-counter triple antibiotic ointment and peroxide. He presented to urgent care on 3/12 with a 2 x 2 centimeter ulceration to the right heel and  was discharged with a 10 day prescription for Bactrim. He does have a history of neuropathy, diet controlled diabetic. His does not know his most recent A1c but will be seeing his PCP next week, there is no evidence of an A1c in Epic. He denies smoking, does have a history of alcohol use, he admits to 5+/- beers weekly not daily. He did follow-up with cardiology on 3/18 with c/o ble edema, they ordered DVT study which he completed on 3/21. He reports that the bilateral DVT study was negative per the ultrasound technician, no report is available at this time. READMISSION 11/20/2018 this is a now 68 year old man who is here for review of wounds on his lower extremity on the left. We note that he was here on a single visit in March he tells [place] that over the last 2019. His major issue is that he dropped a box and hit the outside of his left lateral malleolus about 3 weeks ago. This is left him with a nonhealing area. He has been using Iodosorb ointment he had apparently from the last stay in this clinic. Also of note over the last 6 weeks he has noted an area that is raised and will scab over but then he traumatizes a scab and then this bleeds and reopens. This is on the left posterior calf. All of this appears to be on the background of chronic venous insufficiency. He has had DVT studies in the past that were negative in 2019. He is also had  arterial studies that showed an ABI on the right of 0.76 and on the left of 1.05. Past medical history; the patient states that he is not a diabetic although I note there is some suggested he was in the note from last year. He has a history of coronary artery disease. He tells me that he has had a history of skin cancer but he is not sure which one but that includes one on the left upper thigh. ABIs done previously 0.76 on the right and 1.05 on the left 6/26; patient readmitted to the clinic last week. He had a traumatic area over the left lateral malleolus as well as a hyper granulated growth on the left posterior calf. He also has severe chronic venous insufficiency. 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 We have been using Iodoflex to the traumatic wound under compression. 7/9; area over the left lateral malleolus continues to require debridement and we are continuing with Iodoflex under compression. He has severe chronic venous insufficiency. 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 This patient has severe bilateral venous insufficiency and venous hypertension with skin changes resulting. He requires bilateral reflux studies Finally he traumatized his right lateral calf while doing yard work about a week ago. The area is small with a flap of skin over the majority of the wound but I am not sure this is going to remain viable 7/16; still not a viable surface on the left lateral malleolus in fact this wound is deeper. We have been using Iodoflex. 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 He has severe bilateral venous insufficiency and I have ordered reflux studies these  apparently are booked for next  week. Our intake nurse noted some yellowish-green drainage coming from the wound on the left ankle. Patient was insistent on talking about doing carotid ultrasounds and he was hoping to get these done with his reflux studies next week. I spent a few minutes talking to him about this I just could not put together in a logical way what he is concerned about. At the end of this I asked him to see his primary doctor about this he says he does not have a primary doctor advised him to get a primary doctor to go over this with him. It was not really clear where the level of concern was specific to carotid ultrasound 7/23; a better looking surface on the left lateral malleolus although it still requires debridement. He tells me he has an appointment to Mohs surgery center next week. Small area on the right lateral calf. The patient went to have his reflux studies at [facility] I do not think they took his compression wraps off. He did have reflux on the left in the common femoral greater saphenous and saphenofemoral junction and the great saphenous vein in the proximal thigh. 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 Looking at the numbers it would appear that the maximal reflux was in the great saphenous vein in the proximal thigh. As usual I am not sure what would be possible to do here. I will send him to vascular surgery 7/30; the patient still requires debridement in the left lateral malleolus although in general the wound is still deep and and punched out. We are using Sorbact. The area on the right lateral calf is improved. 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 The patient does not have a vascular surgery appointment nor does he have a skin surgery center appointment. I am really not sure what the issues are here although I know vascular  surgery as well behind. 8/6-Patient does not have a vascular appointment nor does he have a skin surgery appointment firmed up at this time although he says he is called them both, impressed upon him that he should see whoever is available for the vascular so the vein studies can be interpreted. 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 He is here for the left lateral malleoli wound for which we are using SOrbact, and he is disappointed the wound is not healing 8/21; patient saw Dr. Doren Custard on 01/21/2019. Dr. Doren Custard was concerned about his arterial flow. Noting that he had trouble feeling the dorsalis pedis and posterior tibial arteries also noteworthy that the Doppler had a barely biphasic posterior tibial signal with a monophasic but fairly brisk dorsalis pedis signal. He is booked for an angiogram next Friday. We have been using 3 layer compression on him. 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 Given Dr.  Dixon's concern it was difficult for me to be comfortable with him going ahead with the cancer extraction and I have suggested canceling this. Finally he has an increase in the erythematous contact dermatitis in the entire wrapped area of the left leg which is probably an allergy to the contact layer of the 3 layer. 8/27; he is going for his angiogram tomorrow by Dr. Doren Custard. We cancel the Mohs surgery on the tumor on his posterior calf pending the angiogram tomorrow. If this shows adequate blood flow to this  area I think we can rebook this. His main wound is on the left lateral malleolus a small refractory punched-out area. We have been using Hydrofera Blue 9/3; his angiogram was done as scheduled. He does indeed have PAD. The common femoral, deep femoral superficial femoral and popliteal arteries will are all widely patent. 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 The overall thought was that he had enough adequate circulation to heal his venous ulcer and continue to use mild elevation and compression. Noted that he had biphasic posterior tibial signals with a Doppler ABI of 0.91. It was not felt that the stenosis in the proximal tibial artery would be associated with increased blood flow he will follow-up with Dr. Doren Custard in 1 month He now needs to get the skin surgery appointment set up. The area is growing larger and bleeding frequently on the posterior left calf per the patient 02/11/19-Patient is back after 1 week, results of his vascular studies are noted, patient needs to have a skin surgery appointment established apparently he did not return the calls this week. The area on the posterior calf is where the skin surgery needs to be done, the left lateral malleoli are wound appears to be about the same we will using Hydrofera Blue change 9/24; patient finally has a booking to deal with the cancer on his posterior calf I believe with the skin surgery center. Not much change in the wound over the left lateral malleolus. It is been 2 weeks since he was here. I changed him to endoform last time 10/2; the patient has been to see dermatology. Scheduled for surgery on 10/14. Using endoform to the wound over the lateral malleolus some improvement in the wound condition but not the surface area. He has severe  chronic venous insufficiency. He also has some arterial disease he was angiogram by Dr. Doren Custard. He did not think that anything needed to be done from an intervention point of view. 10/8; his surgery is still scheduled for 10/14. It is concerning about whether he is healed or maintained skin integrity and is sutured area given the severity of his chronic venous insufficiency. The area we are following is on the left lateral malleolus. Some improvement in the surface area. No debridement today we have been using endoform 10/15; the patient finally had his surgery for the tumor on his left posterior calf. We did not look at this today. Still a punched out area in the lower left malleolus. Perhaps some mild improvement we have been using endoform We were denied for Grafix and still have not heard back on Oasis 10/22; no real change in this wound. I am able to debride it to a healthy looking surface but the next week there is recurrent debris and no improvement  in depth. Punched out over the left lateral malleolus. We have not heard back on Oasis. We were denied for Grafix 11/6; wound is perhaps slightly smaller. Still punched out. He was approved for Oasis we applied Oasis #1. His surgical wound on the back of the left calf was reviewed by dermatology we are not reviewing this 11/13; I think the wound is filled in somewhat. Certainly a much more vibrant looking surface. Oasis #2. I looked at his surgical site on the back of the calf today for the first time. Clean incision small open areas 11/19; measurements not much different today. Still 0.4 cm in depth although post debridement the wound surface looks better. Oasis #3 the site on the back of his calf looks satisfactory. I put him in 4 layer compression last week he seems to tolerate this. He is complaining of some tenderness around the wound. This was controlled swelling around the wound. He seems to have tolerated this 04/28/2019 on evaluation  today patient appears to be doing slightly better based on measurements compared to last week. He is here for Oasis application #4 to the ankle region. Fortunately there is no signs of other wounds open at this point and no signs of infection. 12/3; Oasis #5. We have a better looking wound surface but not much change in depth at 0.6 cm. Electronic Signature(s) Signed: 05/07/2019 7:57:15 AM By: Linton Ham MD Entered By: Linton Ham on 05/06/2019 18:03:25 -------------------------------------------------------------------------------- Physical Exam Details Patient Name: Date of Service: [name], [name] 05/06/2019 3:45 PM Medical Record [id number] Patient Account Number: [id number] Date of Birth/Sex: Treating RN: [birth year] (68 y.o. [name] Primary Care Provider: PATIENT, NO Other Clinician: Referring Provider: Treating Provider/Extender:Robson, [name], Designated Weeks in Treatment: 23 Constitutional Patient is hypertensive.. Pulse regular and within target range for patient.Marland Kitchen Respirations regular, non-labored and within target range.. Temperature is normal and within the target range for the patient.Marland Kitchen Appears in no distress. Notes Wound exam; I removed some adherent Oasis. Underneath the wound surface looks healthy with good granulation. There is no active infection. His peripheral pulses are palpable at the dorsalis pedis and posterior tibia. Oasis #5 applied in the standard fashion Engineer, maintenance) Signed: 05/07/2019 7:57:15 AM By: Linton Ham MD Entered By: Linton Ham on 05/06/2019 18:04:12 -------------------------------------------------------------------------------- Physician Orders Details Patient Name: Date of Service: [name], [name] 05/06/2019 3:45 PM Medical Record [id number] Patient Account Number: [id number] Date of Birth/Sex: Treating RN: 14-Mar-1951 (68 y.o. [name] Primary Care Provider: PATIENT,  NO Other Clinician: Referring Provider: Treating Provider/Extender:Robson, [name], Designated Weeks in Treatment: 23 Verbal / Phone Orders: No Diagnosis Coding ICD-10 Coding Code Description I87.332 Chronic venous hypertension (idiopathic) with ulcer and inflammation of left lower extremity L97.321 Non-pressure chronic ulcer of left ankle limited to breakdown of skin C44.799 Other specified malignant neoplasm of skin of left lower limb, including hip Follow-up Appointments Return Appointment in 1 week. - Thursday Dressing Change Frequency Wound #4 Left,Lateral Malleolus Do not change entire dressing for one week. Skin Barriers/Peri-Wound Care TCA Cream or Ointment - liberal to lower leg Wound Cleansing Wound #4 Left,Lateral Malleolus May shower with protection. Primary Wound Dressing Wound #4 Left,Lateral Malleolus Skin Substitute Application - Oasis Burn Matrix #5. secured with adaptic and steri-strips. leave in place. do not remove. Secondary Dressing Wound #4 Left,Lateral Malleolus Foam - foam donut. Dry Gauze Drawtex Other: - pad the left lateral and anterior portion of lower leg. Edema Control 4 layer compression: Left lower extremity - no kerlix  no cotton. pad with foam to anterior portion of lower leg. Avoid standing for long periods of time Elevate legs to the level of the heart or above for 30 minutes daily and/or when sitting, a frequency of: - throughout the day Exercise regularly Radiology X-ray, knee - left knee x-ray related to pain with ambulation. ICD 10 code M25.562. CPT code Electronic Signature(s) Signed: 05/06/2019 6:37:14 PM By: [name] Signed: 05/07/2019 7:57:15 AM By: Linton Ham MD Entered By: [name] on 05/06/2019 17:37:50 -------------------------------------------------------------------------------- Prescription 05/06/2019 Patient Name: [name] Provider: Linton Ham MD Date of Birth: 04/09/1951 NPI#:  [id number] Sex: M DEA#: [id number] Phone #: [phone] License #: [id number] Patient Address: [facility] [address] [place], [place] [address] [place], [place] [address] [phone] Allergies Compazine Provider's Orders X-ray, knee - left knee x-ray related to pain with ambulation. ICD 10 code M25.562. CPT code Signature(s): Date(s): Electronic Signature(s) Signed: 05/06/2019 6:37:14 PM By: [name] Signed: 05/07/2019 7:57:15 AM By: Linton Ham MD Entered By: [name] on 05/06/2019 17:37:51 --------------------------------------------------------------------------------  Problem List Details Patient Name: Date of Service: [name], [name] 05/06/2019 3:45 PM Medical Record [id number] Patient Account Number: [id number] Date of Birth/Sex: Treating RN: 01-10-1951 (68 y.o. [name] Primary Care Provider: PATIENT, NO Other Clinician: Referring Provider: Treating Provider/Extender:Robson, [name], Designated Weeks in Treatment: 23 Active Problems ICD-10 Evaluated Encounter Code Description Active Date Today Diagnosis I87.332 Chronic venous hypertension (idiopathic) with ulcer 11/20/2018 No Yes and inflammation of left lower extremity L97.321 Non-pressure chronic ulcer of left ankle limited to 11/20/2018 No Yes breakdown of skin C44.799 Other specified malignant neoplasm of skin of left 11/27/2018 No Yes lower limb, including hip Inactive Problems ICD-10 Code Description Active Date Inactive Date L97.211 Non-pressure chronic ulcer of right calf limited to breakdown of 12/10/2018 12/10/2018 skin Resolved Problems Electronic Signature(s) Signed: 05/07/2019 7:57:15 AM By: Linton Ham MD Entered By: Linton Ham on 05/06/2019 18:01:45 -------------------------------------------------------------------------------- Progress Note Details Patient Name: Date of Service: [name] 05/06/2019 3:45 PM Medical Record [id number] Patient Account Number: [id number] Date of Birth/Sex: Treating RN: 1950/09/27 (68 y.o. [name] Primary Care Provider: Other Clinician: PATIENT, NO Referring Provider: Treating Provider/Extender:Robson, [name], Designated Weeks in Treatment: 23 Subjective History of Present Illness (HPI) 08/22/17-He is here for initial evaluation of a right heel wound, right toe wound and left heel fissures. He states that he has chronic dry, flaking skin and frequently has cracked heels. He states the right heel wound developed as such but he developed the wound approximately 3 weeks ago when pulling some skin off; he was unaware of the right toe ulcer. He was treating the right heel with over-the-counter triple antibiotic ointment and peroxide. He presented to urgent care on 3/12 with a 2 x 2 centimeter ulceration to the right heel and was discharged with a 10 day prescription for Bactrim. He does have a history of neuropathy, diet controlled diabetic. His does not know his most recent A1c but will be seeing his PCP next week, there is no evidence of an A1c in Epic. He denies smoking, does have a history of alcohol use, he admits to 5+/- beers weekly not daily. He did follow-up with cardiology on 3/18 with c/o ble edema, they ordered DVT study which he completed on 3/21. He reports that the bilateral DVT study was negative per the ultrasound technician, no report is available at this time. READMISSION 11/20/2018 this is  a now 68 year old man who is here for review of wounds on his lower extremity on the left. We note that he was here on a single visit in March he tells [place] that over the last 2019. His major issue is that he dropped a box and hit the outside of his left lateral malleolus about 3 weeks ago. This is left him with a nonhealing area. He has been using Iodosorb ointment he had apparently from the last stay in this clinic.  Also of note over the last 6 weeks he has noted an area that is raised and will scab over but then he traumatizes a scab and then this bleeds and reopens. This is on the left posterior calf. All of this appears to be on the background of chronic venous insufficiency. He has had DVT studies in the past that were negative in 2019. He is also had arterial studies that showed an ABI on the right of 0.76 and on the left of 1.05. Past medical history; the patient states that he is not a diabetic although I note there is some suggested he was in the note from last year. He has a history of coronary artery disease. He tells me that he has had a history of skin cancer but he is not sure which one but that includes one on the left upper thigh. ABIs done previously 0.76 on the right and 1.05 on the left 6/26; patient readmitted to the clinic last week. He had a traumatic area over the left lateral malleolus as well as a hyper granulated growth on the left posterior calf. He also has severe chronic venous insufficiency. 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 We have been using Iodoflex to the traumatic wound under compression. 7/9; area over the left lateral malleolus continues to require debridement and we are continuing with Iodoflex under compression. He has severe chronic venous insufficiency. 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 This patient has severe bilateral venous insufficiency and venous hypertension with skin changes resulting. He requires bilateral reflux studies Finally he traumatized his right lateral calf while doing yard work about a week ago. The area is small with a flap of skin over the majority of the wound but I am not sure this is going to remain viable 7/16; still not a viable surface on the left  lateral malleolus in fact this wound is deeper. We have been using Iodoflex. 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 He has severe bilateral venous insufficiency and I have ordered reflux studies these apparently are booked for next week. Our intake nurse noted some yellowish-green drainage coming from the wound on the left ankle. Patient was insistent on talking about doing carotid ultrasounds and he was hoping to get these done with his reflux studies next week. I spent a few minutes talking to him about this I just could not put together in a logical way what he is concerned about. At the end of this I asked him to see his primary doctor about this he says he does not have a primary doctor advised him to get a primary doctor to go over this with  him. It was not really clear where the level of concern was specific to carotid ultrasound 7/23; a better looking surface on the left lateral malleolus although it still requires debridement. He tells me he has an appointment to Mohs surgery center next week. Small area on the right lateral calf. The patient went to have [facility] I do not think they took his compression wraps off. He did have reflux on the left in the common femoral greater saphenous and saphenofemoral junction and the great saphenous vein in the proximal thigh. 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 Looking at the numbers it would appear that the maximal reflux was in the great saphenous vein in the proximal thigh. As usual I am not sure what would be possible to do here. I will send him to vascular surgery 7/30; the patient still requires debridement in the left lateral malleolus although in general the  wound is still deep and and punched out. We are using Sorbact. The area on the right lateral calf is improved. 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 The patient does not have a vascular surgery appointment nor does he have a skin surgery center appointment. I am really not sure what the issues are here although I know vascular surgery as well behind. 8/6-Patient does not have a vascular appointment nor does he have a skin surgery appointment firmed up at this time although he says he is called them both, impressed upon him that he should see whoever is available for the vascular so the vein studies can be interpreted. 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 He is here for the left lateral malleoli wound for which we are using SOrbact, and he is disappointed the wound is not healing 8/21; patient saw Dr. Doren Custard on 01/21/2019. Dr. Doren Custard was concerned about his arterial flow. Noting that he had trouble feeling the dorsalis pedis and posterior tibial arteries also noteworthy that the Doppler had a barely biphasic posterior tibial signal with a monophasic but fairly brisk dorsalis pedis signal. He is booked for an angiogram next Friday. We have been using 3 layer compression on him. 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 Given Dr. Mee Hives concern it was difficult for me to be comfortable with him going ahead with the cancer extraction and I have suggested canceling this. Finally he has  an increase in the erythematous contact dermatitis in the entire wrapped area of the left leg which is probably an allergy to the contact layer of the 3 layer. 8/27; he is going for his angiogram tomorrow by Dr. Doren Custard. We cancel the Mohs surgery on the tumor on his posterior calf pending the angiogram tomorrow. If this shows adequate blood flow to this area I think we can rebook this. His main wound is on the left  lateral malleolus a small refractory punched-out area. We have been using Hydrofera Blue 9/3; his angiogram was done as scheduled. He does indeed have PAD. The common femoral, deep femoral superficial femoral and popliteal arteries will are all widely patent. 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 The overall thought was that he had enough adequate circulation to heal his venous ulcer and continue to use mild elevation and compression. Noted that he had biphasic posterior tibial signals with a Doppler ABI of 0.91. It was not felt that the stenosis in the proximal tibial artery would be associated with increased blood flow he will follow-up with Dr. Doren Custard in 1 month He now needs to get the skin surgery appointment set up. The area is growing larger and bleeding frequently on the posterior left calf per the patient 02/11/19-Patient is back after 1 week, results of his vascular studies are noted, patient needs to have a skin surgery appointment established apparently he did not return the calls this week. The area on the posterior calf is where the skin surgery needs to be done, the left lateral malleoli are wound appears to be about the same we will using Hydrofera Blue change 9/24; patient finally has a booking to deal with the cancer on his posterior calf I believe with the skin surgery  center. Not much change in the wound over the left lateral malleolus. It is been 2 weeks since he was here. I changed him to endoform last time 10/2; the patient has been to see dermatology. Scheduled for surgery on 10/14. Using endoform to the wound over the lateral malleolus some improvement in the wound condition but not the surface area. He has severe chronic venous insufficiency. He also has some arterial disease he was angiogram by Dr. Doren Custard. He did not think that anything needed to be done from an intervention point of view. 10/8; his surgery is still scheduled for 10/14. It is concerning about whether he is healed or maintained skin integrity and is sutured area given the severity of his chronic venous insufficiency. The area we are following is on the left lateral malleolus. Some improvement in the surface area. No debridement today we have been using endoform 10/15; the patient finally had his surgery for the tumor on his left posterior calf. We did not look at this today. Still a punched out area in the lower left malleolus. Perhaps some mild improvement we have been using endoform We were denied for Grafix and still have not heard back on Oasis 10/22; no real change in this wound. I am able to debride it to a healthy looking surface but the next week there is recurrent debris and no improvement in depth. Punched out over the left lateral malleolus. We have not heard back on Oasis. We were denied for Grafix 11/6; wound is perhaps slightly smaller. Still punched out. He was approved for Oasis we applied Oasis #1. His surgical wound on the back of the left calf was reviewed by dermatology we are not reviewing this 11/13; I think the wound is filled in somewhat. Certainly a much more vibrant looking surface. Oasis #2. I looked at his surgical site on the back of the calf today for the first time. Clean incision small open areas 11/19; measurements not much different today. Still 0.4  cm in  depth although post debridement the wound surface looks better. Oasis #3 the site on the back of his calf looks satisfactory. I put him in 4 layer compression last week he seems to tolerate this. He is complaining of some tenderness around the wound. This was controlled swelling around the wound. He seems to have tolerated this 04/28/2019 on evaluation today patient appears to be doing slightly better based on measurements compared to last week. He is here for Oasis application #4 to the ankle region. Fortunately there is no signs of other wounds open at this point and no signs of infection. 12/3; Oasis #5. We have a better looking wound surface but not much change in depth at 0.6 cm. Objective Constitutional Patient is hypertensive.. Pulse regular and within target range for patient.Marland Kitchen Respirations regular, non-labored and within target range.. Temperature is normal and within the target range for the patient.Marland Kitchen Appears in no distress. Vitals Time Taken: 4:50 PM, Height: 73 in, Weight: 205 lbs, BMI: 27, Temperature: 98.3 F, Pulse: 75 bpm, Respiratory Rate: 18 breaths/min, Blood Pressure: 180/91 mmHg. General Notes: Wound exam; I removed some adherent Oasis. Underneath the wound surface looks healthy with good granulation. There is no active infection. His peripheral pulses are palpable at the dorsalis pedis and posterior tibia. Oasis #5 applied in the standard fashion Integumentary (Hair, Skin) Wound #4 status is Open. Original cause of wound was Trauma. The wound is located on the Left,Lateral Malleolus. The wound measures 0.8cm length x 0.8cm width x 0.6cm depth; 0.503cm^2 area and 0.302cm^3 volume. There is Fat Layer (Subcutaneous Tissue) Exposed exposed. There is no tunneling or undermining noted. There is a medium amount of serous drainage noted. The wound margin is well defined and not attached to the wound base. There is small (1-33%) pink, pale granulation within the wound bed. There  is a large (67-100%) amount of necrotic tissue within the wound bed including Adherent Slough. 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 There was a Excisional Skin/Subcutaneous Tissue Debridement with a total area of 0.64 sq cm performed by Ricard Dillon., MD. With the following instrument(s): Curette to remove Viable and Non-Viable tissue/material. Material removed includes Subcutaneous Tissue, Slough, Skin: Dermis, and Fibrin/Exudate after achieving pain control using Lidocaine 4% Topical Solution. A time out was conducted at 17:25, prior to the start of the procedure. There was no bleeding. The procedure was tolerated well with a pain level of 0 throughout and a pain level of 0 following the procedure. Post Debridement Measurements: 0.8cm length x 0.8cm width x 0.6cm depth; 0.302cm^3 volume. Character of Wound/Ulcer Post Debridement is improved. Severity of Tissue Post Debridement is: Fat layer exposed. Post procedure Diagnosis Wound #4: Same as Pre-Procedure Pre-procedure diagnosis of Wound #4 is a Venous Leg Ulcer located on the Left,Lateral Malleolus. A skin graft procedure using a bioengineered skin substitute/cellular or tissue based product was performed by Ricard Dillon., MD with the following instrument(s): Forceps and Scissors. Oasis wound matrix was applied and secured with Steri-Strips and adaptic. 7 sq cm of product was utilized and 3 sq cm was wasted due to wound size. Post Application, no dressing was  applied. A Time Out was conducted at 17:29, prior to the start of  the procedure. The procedure was tolerated well with a pain level of 0 throughout and a pain level of 0 following the procedure. Post procedure Diagnosis Wound #4: Same as Pre-Procedure . Pre-procedure diagnosis of Wound #4 is a Venous Leg Ulcer located on the Left,Lateral Malleolus . There was a Four Layer Compression Therapy Procedure by Michael Pilling, RN. Post procedure Diagnosis Wound #4: Same as Pre-Procedure Plan Follow-up Appointments: Return Appointment in 1 week. - Thursday Dressing Change Frequency: Wound #4 Left,Lateral Malleolus: Do not change entire dressing for one week. Skin Barriers/Peri-Wound Care: TCA Cream or Ointment - liberal to lower leg Wound Cleansing: Wound #4 Left,Lateral Malleolus: May shower with protection. Primary Wound Dressing: Wound #4 Left,Lateral Malleolus: Skin Substitute Application - Oasis Burn Matrix #5. secured with adaptic and steri-strips. leave in place. do not remove. Secondary Dressing: Wound #4 Left,Lateral Malleolus: Foam - foam donut. Dry Gauze Drawtex Other: - pad the left lateral and anterior portion of lower leg. Edema Control: 4 layer compression: Left lower extremity - no kerlix no cotton. pad with foam to anterior portion of lower leg. Avoid standing for long periods of time Elevate legs to the level of the heart or above for 30 minutes daily and/or when sitting, a frequency of: - throughout the day Exercise regularly Radiology ordered were: X-ray, knee - left knee x-ray related to pain with ambulation. ICD 10 code M25.562. CPT code 1. I continued the Oasis largely because the wound surface seems to look better although the depth is really not improving 2. We have the dichotomy of easily palpable peripheral pulses and Dr. Mee Hives previous angiogram that I think showed one-vessel runoff. I will need to review this. He also has severe venous insufficiency Electronic Signature(s) Signed: 05/06/2019 6:37:14 PM By: [name] Signed: 05/07/2019 7:57:15 AM By: Linton Ham MD Entered By: [name] on 05/06/2019 18:18:23 -------------------------------------------------------------------------------- SuperBill Details Patient Name: Date of Service: [name], [name] 05/06/2019 Medical Record [id number] Patient Account Number: [id number] Date of Birth/Sex: Treating RN: 06-04-1950 (68 y.o. 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

## 2019-05-11 NOTE — Progress Notes (Signed)
[name], [name] ([id number]) Visit Report for 03/18/2019 Arrival Information Details Patient Name: Date of Service: [name], [name] 03/18/2019 3:30 PM Medical Record [id number] Patient Account Number: [id number] Date of Birth/Sex: Treating RN: 09-13-1950 (68 y.o. [name] Primary Care Tylor Courtwright: PATIENT, NO Other Clinician: Referring Syre Knerr: Treating Jariah Jarmon/Extender:Robson, [name], Designated Weeks in Treatment: 16 Visit Information History Since Last Visit Added or deleted any medications: No Patient Arrived: [name] Any new allergies or adverse reactions: No Arrival Time: 16:34 Had a fall or experienced change in No Accompanied By: self activities of daily living that may affect Transfer Assistance: None risk of falls: Patient Identification Verified: Yes Signs or symptoms of abuse/neglect since last No Secondary Verification Process Yes visito Completed: Hospitalized since last visit: No Patient Requires Transmission- No Implantable device outside of the clinic excluding No Based Precautions: cellular tissue based products placed in the center Patient Has Alerts: Yes ABIs 08/2018 L1.05 since last visit: Patient Alerts: Has Dressing in Place as Prescribed: Yes R0.76 Pain Present Now: Yes Electronic Signature(s) Signed: 05/11/2019 3:02:15 PM By: [name] Entered By: [name] on 03/18/2019 16:35:20 -------------------------------------------------------------------------------- Compression Therapy Details Patient Name: Date of Service: [name], [name] 03/18/2019 3:30 PM Medical Record [id number] Patient Account Number: [id number] Date of Birth/Sex: Treating RN: [birth year] (68 y.o. [name] Primary Care Reita Shindler: PATIENT, NO Other Clinician: Referring Emmerson Shuffield: Treating Kateria Cutrona/Extender:Robson, [name], Designated Weeks in Treatment: 16 Compression Therapy Performed for Wound Wound #4 Left,Lateral  Malleolus Assessment: Performed By: Clinician [name], RN Compression Type: Three Layer Post Procedure Diagnosis Same as Pre-procedure Electronic Signature(s) Signed: 03/19/2019 6:00:55 PM By: [name] RN, BSN Entered By: [name] on 03/18/2019 17:09:43 -------------------------------------------------------------------------------- Encounter Discharge Information Details Patient Name: Date of Service: [name], [name] 03/18/2019 3:30 PM Medical Record [id number] Patient Account Number: [id number] Date of Birth/Sex: Treating RN: May 22, 1951 (68 y.o. [name] Primary Care Adelayde Minney: PATIENT, NO Other Clinician: Referring Chitara Clonch: Treating Luwana Butrick/Extender:Robson, [name], Designated Weeks in Treatment: 16 Encounter Discharge Information Items Post Procedure Vitals Discharge Condition: Stable Temperature (F): 98.3 Ambulatory Status: Cane Pulse (bpm): 91 Discharge Destination: Home Respiratory Rate (breaths/min): 17 Transportation: Private Auto Blood Pressure (mmHg): 159/93 Accompanied By: alone Schedule Follow-up Appointment: Yes Clinical Summary of Care: Patient Declined Electronic Signature(s) Signed: 03/19/2019 6:00:55 PM By: [name] RN, BSN Entered By: [name] on 03/18/2019 18:20:34 -------------------------------------------------------------------------------- Lower Extremity Assessment Details Patient Name: Date of Service: [name], [name] 03/18/2019 3:30 PM Medical Record [id number] Patient Account Number: [id number] Date of Birth/Sex: Treating RN: 23-Feb-1951 (68 y.o. [name] Primary Care Jackalyn Haith: PATIENT, NO Other Clinician: Referring Terina Mcelhinny: Treating Leianne Callins/Extender:Robson, [name], Designated Weeks in Treatment: 16 Edema Assessment Assessed: [Left: No] [Right: No] Edema: [Left: Yes] [Right: No] Calf Left: Right: Point of Measurement: 35 cm From Medial Instep 34 cm  cm Ankle Left: Right: Point of Measurement: 9 cm From Medial Instep 26.2 cm cm Vascular Assessment Pulses: Dorsalis Pedis Palpable: [Left:Yes] Electronic Signature(s) Signed: 03/19/2019 6:00:55 PM By: [name] RN, BSN Entered By: [name] on 03/18/2019 16:48:54 -------------------------------------------------------------------------------- Multi Wound Chart Details Patient Name: Date of Service: [name] 03/18/2019 3:30 PM Medical Record [id number] Patient Account Number: [id number] Date of Birth/Sex: Treating RN: [birth year] (68 y.o. [name] Primary Care Bertis Hustead: PATIENT, NO Other Clinician: Referring Malena Timpone: Treating Karlen Barbar/Extender:Robson, [name], Designated Weeks in Treatment: 16 Vital Signs Height(in): 73 Pulse(bpm): 91 Weight(lbs): 205 Blood Pressure(mmHg): 159/93 Body Mass Index(BMI): 27 Temperature(F): 98.3 Respiratory 17 Rate(breaths/min): Photos: [4:No Photos] [5:No Photos] [N/A:N/A]  Wound Location: [4:Left Malleolus - Lateral] [5:Left, Posterior Calf] [N/A:N/A] Wounding Event: [4:Trauma] [5:Gradually Appeared] [N/A:N/A] Primary Etiology: [4:Venous Leg Ulcer] [5:Atypical] [N/A:N/A] Comorbid History: [4:Arrhythmia, Coronary Artery N/A Disease, Type II Diabetes] [N/A:N/A] Date Acquired: [4:11/02/2018] [5:10/02/2018] [N/A:N/A] Weeks of Treatment: [4:16] [5:16] [N/A:N/A] Wound Status: [4:Open] [5:Healed - Surgical Closure N/A] Measurements L x W x D 0.6x0.8x0.5 [5:0x0x0] [N/A:N/A] (cm) Area (cm) : [4:0.377] [5:0] [N/A:N/A] Volume (cm) : [4:0.188] [5:0] [N/A:N/A] % Reduction in Area: [4:70.90%] [5:100.00%] [N/A:N/A] % Reduction in Volume: -44.60% [5:100.00%] [N/A:N/A] Classification: [4:Full Thickness Without Exposed Support Structures Exposed Support Structures] [5:Full Thickness Without] [N/A:N/A] Exudate Amount: [4:Small] [5:N/A] [N/A:N/A] Exudate Type: [4:Serosanguineous] [5:N/A] [N/A:N/A] Exudate Color:  [4:red, brown] [5:N/A] [N/A:N/A] Wound Margin: [4:Well defined, not attached] [5:N/A] [N/A:N/A] Granulation Amount: [4:Medium (34-66%)] [5:N/A] [N/A:N/A] Granulation Quality: [4:Pink, Pale] [5:N/A] [N/A:N/A] Necrotic Amount: [4:Medium (34-66%)] [5:N/A] [N/A:N/A] Exposed Structures: [4:Fat Layer (Subcutaneous Tissue) Exposed: Yes Fascia: No Tendon: No Muscle: No Joint: No Bone: No] [5:N/A] [N/A:N/A] Epithelialization: [4:Small (1-33%)] [5:N/A] [N/A:N/A] Debridement: [4:Debridement - Excisional] [5:N/A] [N/A:N/A] Pre-procedure [4:17:07] [5:N/A] [N/A:N/A] Verification/Time Out Taken: Pain Control: [4:Lidocaine 4% Topical Solution] [5:N/A] [N/A:N/A] Tissue Debrided: [4:Subcutaneous, Slough] [5:N/A] [N/A:N/A] Level: [4:Skin/Subcutaneous Tissue] [5:N/A] [N/A:N/A] Debridement Area (sq cm):0.48 [5:N/A] [N/A:N/A] Instrument: [4:Curette] [5:N/A] [N/A:N/A] Bleeding: [4:Minimum] [5:N/A] [N/A:N/A] Hemostasis Achieved: [4:Pressure] [5:N/A] [N/A:N/A] Procedural Pain: [4:0] [5:N/A] [N/A:N/A] Post Procedural Pain: [4:0] [5:N/A] [N/A:N/A] Debridement Treatment Procedure was tolerated [5:N/A] [N/A:N/A] Response: [4:well] Post Debridement [4:0.6x0.8x0.5] [5:N/A] [N/A:N/A] Measurements L x W x D (cm) Post Debridement [4:0.188] [5:N/A] [N/A:N/A] Volume: (cm) Procedures Performed: Compression Therapy [4:Debridement] [5:N/A] [N/A:N/A] Treatment Notes Electronic Signature(s) Signed: 03/18/2019 5:35:18 PM By: Linton Ham MD Signed: 03/19/2019 6:00:55 PM By: [name] RN, BSN Entered By: Linton Ham on 03/18/2019 17:30:16 -------------------------------------------------------------------------------- Multi-Disciplinary Care Plan Details Patient Name: Date of Service: [name], [name] 03/18/2019 3:30 PM Medical Record [id number] Patient Account Number: [id number] Date of Birth/Sex: Treating RN: [birth year] (68 y.o. [name] Primary Care Aldon Hengst: PATIENT, NO Other  Clinician: Referring Mykale Gandolfo: Treating Meredith Kilbride/Extender:Robson, [name], Designated Weeks in Treatment: 16 Active Inactive Malignancy/Atypical Etiology Nursing Diagnoses: Knowledge deficit related to disease process and management of atypical ulcer etiology Knowledge deficit related to disease process and management of malignancy Goals: Patient/caregiver will verbalize understanding of disease process and disease management of malignancy Date Initiated: 11/20/2018 Target Resolution Date: 04/02/2019 Goal Status: Active Interventions: Assess patient and family medical history for signs and symptoms of malignancy/atypical etiology upon admission Provide education on malignant ulcerations Treatment Activities: Test ordered outside of clinic : 11/20/2018 Notes: Venous Leg Ulcer Nursing Diagnoses: Knowledge deficit related to disease process and management Potential for venous Insuffiency (use before diagnosis confirmed) Goals: Patient will maintain optimal edema control Date Initiated: 11/20/2018 Target Resolution Date: 04/02/2019 Goal Status: Active Patient/caregiver will verbalize understanding of disease process and disease management Date Initiated: 11/20/2018 Date Inactivated: 12/17/2018 Target Resolution Date: 12/18/2018 Goal Status: Met Interventions: Assess peripheral edema status every visit. Compression as ordered Provide education on venous insufficiency Treatment Activities: Therapeutic compression applied : 11/20/2018 Notes: Electronic Signature(s) Signed: 03/19/2019 6:00:55 PM By: [name] RN, BSN Entered By: [name] on 03/18/2019 18:18:00 -------------------------------------------------------------------------------- Pain Assessment Details Patient Name: Date of Service: [name], [name] 03/18/2019 3:30 PM Medical Record [id number] Patient Account Number: [id number] Date of Birth/Sex: Treating RN: 1950/11/18 (68 y.o. [name] Primary Care Dorita Rowlands: PATIENT, NO Other Clinician: Referring Sharilyn Geisinger: Treating Aundria Bitterman/Extender:Robson, [name], Designated Weeks in Treatment: 16 Active Problems Location of Pain Severity and Description of Pain Patient Has Paino Yes Site  Locations Rate the pain. Current Pain Level: 6 Pain Management and Medication Current Pain Management: Electronic Signature(s) Signed: 03/19/2019 6:00:55 PM By: [name] RN, BSN Signed: 05/11/2019 3:02:15 PM By: [name] Entered By: [name] on 03/18/2019 16:35:53 -------------------------------------------------------------------------------- Patient/Caregiver Education Details Patient Name: Date of Service: [name] 10/15/2020andnbsp3:30 PM Medical Record [phone] Patient Account Number: [id number] Date of Birth/Gender: Treating RN: 15-May-1951 (68 y.o. [name] Primary Care Physician: PATIENT, NO Other Clinician: Referring Physician: Treating Physician/Extender:Robson, [name], Designated Weeks in Treatment: 16 Education Assessment Education Provided To: Patient Education Topics Provided Wound/Skin Impairment: Methods: Explain/Verbal Responses: State content correctly [employer]) Signed: 03/19/2019 6:00:55 PM By: [name] RN, BSN Entered By: [name] on 03/18/2019 18:18:10 -------------------------------------------------------------------------------- Wound Assessment Details Patient Name: Date of Service: [name], [name] 03/18/2019 3:30 PM Medical Record [id number] Patient Account Number: [id number] Date of Birth/Sex: Treating RN: [birth year] (68 y.o. [name] Primary Care Provider: PATIENT, NO Other Clinician: Referring Provider: Treating Provider/Extender:Robson, [name], Designated Weeks in Treatment: 16 Wound Status Wound Number: 4 Primary Venous Leg Ulcer Etiology: Wound Location: Left Malleolus -  Lateral Wound Open Wounding Event: Trauma Status: Date Acquired: 11/02/2018 Comorbid Arrhythmia, Coronary Artery Disease, Weeks Of Treatment: 16 History: Type II Diabetes Clustered Wound: No Photos Wound Measurements Length: (cm) 0.6 % Reduct Width: (cm) 0.8 % Reduct Depth: (cm) 0.5 Epitheli Area: (cm) 0.377 Tunneli Volume: (cm) 0.188 Undermi Wound Description Full Thickness Without Exposed Support Foul Od Classification: Structures Slough/ Wound Well defined, not attached Margin: Exudate Small Amount: Exudate Serosanguineous Type: Exudate red, brown Color: Wound Bed Granulation Amount: Medium (34-66%) Granulation Quality: Pink, Pale Fascia E Necrotic Amount: Medium (34-66%) Fat Laye Necrotic Quality: Adherent Slough Tendon E Muscle E Joint Ex Bone Exp or After Cleansing: No Fibrino Yes Exposed Structure xposed: No r (Subcutaneous Tissue) Exposed: Yes xposed: No xposed: No posed: No osed: No ion in Area: 70.9% ion in Volume: -44.6% alization: Small (1-33%) ng: No ning: No Electronic Signature(s) Signed: 03/23/2019 1:32:19 PM By: Mikeal Hawthorne EMT/HBOT Signed: 03/24/2019 6:50:57 PM By: [name] RN, BSN Previous Signature: 03/19/2019 6:00:55 PM Version By: [name] RN, BSN Entered By: Mikeal Hawthorne on 03/23/2019 10:45:24 -------------------------------------------------------------------------------- Wound Assessment Details Patient Name: Date of Service: [name], [name] 03/18/2019 3:30 PM Medical Record [id number] Patient Account Number: [id number] Date of Birth/Sex: Treating RN: 19-Feb-1951 (68 y.o. [name] Primary Care Provider: PATIENT, NO Other Clinician: Referring Provider: Treating Provider/Extender:Robson, [name], Designated Weeks in Treatment: 16 Wound Status Wound Number: 5 Primary Etiology: Atypical Wound Location: Left, Posterior Calf Wound Status: Healed - Surgical Closure Wounding Event:  Gradually Appeared Date Acquired: 10/02/2018 Weeks Of Treatment: 16 Clustered Wound: No Wound Measurements Length: (cm) 0 Width: (cm) 0 Depth: (cm) 0 Area: (cm) 0 Volume: (cm) 0 Wound Description Full Thickness Without Exposed Suppor Classification: Structures % Reduction in Area: 100% % Reduction in Volume: 100% t Electronic Signature(s) Signed: 03/19/2019 6:00:55 PM By: [name] RN, BSN Entered By: [name] on 03/18/2019 16:49:17 -------------------------------------------------------------------------------- [place] Details Patient Name: Date of Service: [name]. 03/18/2019 3:30 PM Medical Record [id number] Patient Account Number: [id number] Date of Birth/Sex: Treating RN: Mar 27, 1951 (68 y.o. [name] Primary Care Provider: PATIENT, NO Other Clinician: Referring Provider: Treating Provider/Extender:Robson, [name], Designated Weeks in Treatment: 16 Vital Signs Time Taken: 16:35 Temperature (F): 98.3 Height (in): 73 Pulse (bpm): 91 Weight (lbs): 205 Respiratory Rate (breaths/min): 17 Body Mass Index (BMI): 27 Blood Pressure (mmHg): 159/93 Reference Range: 80 -  120 mg / dl Electronic Signature(s) Signed: 05/11/2019 3:02:15 PM By: [name] Entered By: [name] on 03/18/2019 16:35:39

## 2019-05-11 NOTE — Progress Notes (Signed)
ANTHON, PLOTTS ([id number]) Visit Report for 01/25/2019 SuperBill Details Patient Name: Date of Service: Leonidus, Carleton 01/25/2019 Medical Record [id number] Patient Account Number: [id number] Date of Birth/Sex: Treating RN: 01-21-1951 (68 y.o. 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

## 2019-05-11 NOTE — Progress Notes (Signed)
[place], WARSHAWSKY ([id number]) Visit Report for 02/04/2019 Arrival Information Details Patient Name: Date of Service: [place], Clasby 02/04/2019 11:15 AM Medical Record [id number] Patient Account Number: [id number] Date of Birth/Sex: Treating RN: 1950/07/25 (68 y.o. [name] Primary Care Mayme Profeta: PATIENT, NO Other Clinician: Referring Elwanda Moger: Treating Fadil Macmaster/Extender:Robson, [name], Designated Weeks in Treatment: 10 Visit Information History Since Last Visit Added or deleted any medications: No Patient Arrived: [name] Any new allergies or adverse reactions: No Arrival Time: 11:38 Had a fall or experienced change in No Accompanied By: self activities of daily living that may affect Transfer Assistance: None risk of falls: Patient Identification Verified: Yes Signs or symptoms of abuse/neglect since last No Secondary Verification Process Yes visito Completed: Hospitalized since last visit: No Patient Requires Transmission- No Implantable device outside of the clinic excluding No Based Precautions: cellular tissue based products placed in the center Patient Has Alerts: Yes ABIs 08/2018 L1.05 since last visit: Patient Alerts: Has Dressing in Place as Prescribed: Yes R0.76 Pain Present Now: Yes Electronic Signature(s) Signed: 05/11/2019 3:04:24 PM By: [name] Entered By: [name] on 02/04/2019 11:40:23 -------------------------------------------------------------------------------- Compression Therapy Details Patient Name: Date of Service: Brandonlee, Navis 02/04/2019 11:15 AM Medical Record [id number] Patient Account Number: [id number] Date of Birth/Sex: Treating RN: Mar 19, 1951 (68 y.o. [name] Primary Care Caitlyn Buchanan: PATIENT, NO Other Clinician: Referring Morell Mears: Treating Maelin Kurkowski/Extender:Robson, [name], Designated Weeks in Treatment: 10 Compression Therapy Performed for Wound Wound #4 Left,Lateral  Malleolus Assessment: Performed By: Clinician [name], RN Compression Type: Three Layer Pre Treatment ABI: 1 Post Procedure Diagnosis Same as Pre-procedure Electronic Signature(s) Signed: 02/04/2019 5:44:03 PM By: [name] Entered By: [name] on 02/04/2019 12:16:34 -------------------------------------------------------------------------------- Lower Extremity Assessment Details Patient Name: Date of Service: Maxden, Naji 02/04/2019 11:15 AM Medical Record [id number] Patient Account Number: [id number] Date of Birth/Sex: Treating RN: 28-Oct-1950 (68 y.o. [name] Primary Care Gertrude Bucks: PATIENT, NO Other Clinician: Referring Kandas Oliveto: Treating Maddyx Wieck/Extender:Robson, [name], Designated Weeks in Treatment: 10 Edema Assessment Assessed: [Left: No] [Right: No] Edema: [Left: Yes] [Right: No] Calf Left: Right: Point of Measurement: 35 cm From Medial Instep 33 cm cm Ankle Left: Right: Point of Measurement: 9 cm From Medial Instep 25 cm cm Electronic Signature(s) Signed: 02/10/2019 6:55:12 PM By: [name] RN, BSN Signed: 05/11/2019 3:04:24 PM By: [name] Entered By: [name] on 02/04/2019 11:56:26 -------------------------------------------------------------------------------- Multi Wound Chart Details Patient Name: Date of Service: [name]. 02/04/2019 11:15 AM Medical Record [id number] Patient Account Number: [id number] Date of Birth/Sex: Treating RN: 07-20-1950 (68 y.o. [name] Primary Care Mishal Probert: PATIENT, NO Other Clinician: Referring Burlin Mcnair: Treating Keyira Mondesir/Extender:Robson, [name], Designated Weeks in Treatment: 10 Vital Signs Height(in): 73 Pulse(bpm): 90 Weight(lbs): 205 Blood Pressure(mmHg): 175/106 Body Mass Index(BMI): 27 Temperature(F): 99.1 Respiratory 16 Rate(breaths/min): Photos: [4:No Photos] [5:No Photos] [N/A:N/A] Wound Location: [4:Left Malleolus -  Lateral] [5:Left Calf - Posterior] [N/A:N/A] Wounding Event: [4:Trauma] [5:Gradually Appeared] [N/A:N/A] Primary Etiology: [4:Venous Leg Ulcer] [5:Atypical] [N/A:N/A] Comorbid History: [4:Arrhythmia, Coronary Artery Arrhythmia, Coronary Artery N/A Disease, Type II Diabetes Disease, Type II Diabetes] Date Acquired: [4:11/02/2018] [5:10/02/2018] [N/A:N/A] Weeks of Treatment: [4:10] [5:10] [N/A:N/A] Wound Status: [4:Open] [5:Open] [N/A:N/A] Measurements L x W x D 1.2x1x0.6 [5:2.8x1.5x0.1] [N/A:N/A] (cm) Area (cm) : [4:0.942] [5:3.299] [N/A:N/A] Volume (cm) : [4:0.565] [5:0.33] [N/A:N/A] % Reduction in Area: [4:27.30%] [5:-289.00%] [N/A:N/A] % Reduction in Volume: -334.60% [5:-288.20%] [N/A:N/A] Classification: [4:Full Thickness Without Exposed Support Structures Exposed Support Structures] [5:Full Thickness Without] [N/A:N/A] Exudate Amount: [4:Small] [5:Medium] [N/A:N/A] Exudate Type: [  4:Serosanguineous] [5:Serosanguineous] [N/A:N/A] Exudate Color: [4:red, brown] [5:red, brown] [N/A:N/A] Wound Margin: [4:Flat and Intact] [5:Flat and Intact] [N/A:N/A] Granulation Amount: [4:Small (1-33%)] [5:Large (67-100%)] [N/A:N/A] Granulation Quality: [4:Pink] [5:Red, Hyper-granulation] [N/A:N/A] Necrotic Amount: [4:Large (67-100%)] [5:None Present (0%)] [N/A:N/A] Exposed Structures: [4:Fat Layer (Subcutaneous Fat Layer (Subcutaneous N/A Tissue) Exposed: Yes Fascia: No Tendon: No Muscle: No Joint: No Bone: No Small (1-33%)] [5:Tissue) Exposed: Yes Fascia: No Tendon: No Muscle: No Joint: No Bone: No None] [N/A:N/A] Treatment Notes Electronic Signature(s) Signed: 02/04/2019 5:10:59 PM By: Linton Ham MD Signed: 02/10/2019 6:55:12 PM By: [name] RN, BSN Entered By: Linton Ham on 02/04/2019 12:08:01 -------------------------------------------------------------------------------- Multi-Disciplinary Care Plan Details Patient Name: Date of Service: Foy, Vanduyne 02/04/2019 11:15 AM Medical  Record [id number] Patient Account Number: [id number] Date of Birth/Sex: Treating RN: [birth year] (68 y.o. [name] Primary Care Brownie Nehme: PATIENT, NO Other Clinician: Referring Euan Wandler: Treating Joselynne Killam/Extender:Robson, [name], Designated Weeks in Treatment: 10 Active Inactive Malignancy/Atypical Etiology Nursing Diagnoses: Knowledge deficit related to disease process and management of atypical ulcer etiology Knowledge deficit related to disease process and management of malignancy Goals: Patient/caregiver will verbalize understanding of disease process and disease management of malignancy Date Initiated: 11/20/2018 Target Resolution Date: 04/02/2019 Goal Status: Active Interventions: Assess patient and family medical history for signs and symptoms of malignancy/atypical etiology upon admission Provide education on malignant ulcerations Treatment Activities: Test ordered outside of clinic : 11/20/2018 Notes: Venous Leg Ulcer Nursing Diagnoses: Knowledge deficit related to disease process and management Potential for venous Insuffiency (use before diagnosis confirmed) Goals: Patient will maintain optimal edema control Date Initiated: 11/20/2018 Target Resolution Date: 04/02/2019 Goal Status: Active Patient/caregiver will verbalize understanding of disease process and disease management Date Initiated: 11/20/2018 Date Inactivated: 12/17/2018 Target Resolution Date: 12/18/2018 Goal Status: Met Interventions: Assess peripheral edema status every visit. Compression as ordered Provide education on venous insufficiency Treatment Activities: Therapeutic compression applied : 11/20/2018 Notes: Electronic Signature(s) Signed: 02/04/2019 5:44:03 PM By: [name] Entered By: [name] on 02/04/2019 11:30:03 -------------------------------------------------------------------------------- Pain Assessment Details Patient Name: Date of Service: Glenden, Rossell 02/04/2019 11:15 AM Medical Record [id number] Patient Account Number: [id number] Date of Birth/Sex: Treating RN: 07-Sep-1950 (68 y.o. [name] Primary Care Nakeya Adinolfi: PATIENT, NO Other Clinician: Referring Shekinah Pitones: Treating Emiko Osorto/Extender:Robson, [name], Designated Weeks in Treatment: 10 Active Problems Location of Pain Severity and Description of Pain Patient Has Paino Yes Site Locations Rate the pain. Current Pain Level: 6 Pain Management and Medication Current Pain Management: Electronic Signature(s) Signed: 02/10/2019 6:55:12 PM By: [name] RN, BSN Signed: 05/11/2019 3:04:24 PM By: [name] Entered By: [name] on 02/04/2019 11:41:00 -------------------------------------------------------------------------------- Patient/Caregiver Education Details Patient Name: Date of Service: Noboru, Bidinger 9/3/2020andnbsp11:15 AM Medical Record [id number] Patient Account Number: [id number] Date of Birth/Gender: Treating RN: [birth year] (68 y.o. [name] Primary Care Physician: PATIENT, NO Other Clinician: Referring Physician: Treating Physician/Extender:Robson, [name], Designated Weeks in Treatment: 10 Education Assessment Education Provided To: Patient Education Topics Provided Venous: Handouts: Managing Venous Disease and Related Ulcers Methods: Explain/Verbal Responses: Reinforcements needed Electronic Signature(s) Signed: 02/04/2019 5:44:03 PM By: [name] Entered By: [name] on 02/04/2019 11:30:19 -------------------------------------------------------------------------------- Wound Assessment Details Patient Name: Date of Service: Shayn, Madole 02/04/2019 11:15 AM Medical Record [id number] Patient Account Number: [id number] Date of Birth/Sex: Treating RN: [birth year] (68 y.o. [name] Primary Care Leonard Feigel: PATIENT, NO Other Clinician: Referring Annalaura Sauseda: Treating  Lanessa Shill/Extender:Robson, [name], Designated Weeks in Treatment: 10 Wound Status Wound Number: 4 Primary Venous Leg Ulcer Etiology: Wound  Location: Left Malleolus - Lateral Wound Open Wounding Event: Trauma Status: Date Acquired: 11/02/2018 Comorbid Arrhythmia, Coronary Artery Disease, Weeks Of Treatment: 10 History: Type II Diabetes Clustered Wound: No Photos Wound Measurements Length: (cm) 1.2 % Reduct Width: (cm) 1 % Reduct Depth: (cm) 0.6 Epitheli Area: (cm) 0.942 Tunneli Volume: (cm) 0.565 Undermi Wound Description Classification: Full Thickness Without Exposed Support Foul Odo Structures Slough/F Wound Wound Flat and Intact Margin: Exudate Small Amount: Exudate Serosanguineous Type: Exudate red, brown Color: Wound Bed Granulation Amount: Small (1-33%) Granulation Quality: Pink Fascia E Necrotic Amount: Large (67-100%) Fat Laye Necrotic Quality: Adherent Slough Tendon E Muscle E Joint Ex Bone Exp r After Cleansing: No ibrino Yes Exposed Structure xposed: No r (Subcutaneous Tissue) Exposed: Yes xposed: No xposed: No posed: No osed: No ion in Area: 27.3% ion in Volume: -334.6% alization: Small (1-33%) ng: No ning: No Electronic Signature(s) Signed: 02/05/2019 8:17:51 AM By: Mikeal Hawthorne EMT/HBOT Signed: 02/10/2019 6:55:12 PM By: [name] RN, BSN Entered By: Mikeal Hawthorne on 02/05/2019 08:14:57 -------------------------------------------------------------------------------- Wound Assessment Details Patient Name: Date of Service: Kyjuan, Gause 02/04/2019 11:15 AM Medical Record [id number] Patient Account Number: [id number] Date of Birth/Sex: Treating RN: February 15, 1951 (68 y.o. [name] Primary Care Lilia Letterman: PATIENT, NO Other Clinician: Referring Jacquita Mulhearn: Treating Wyllow Seigler/Extender:Robson, [name], Designated Weeks in Treatment: 10 Wound Status Wound Number: 5 Primary Atypical Etiology: Wound Location:  Left Calf - Posterior Wound Open Wounding Event: Gradually Appeared Status: Date Acquired: 10/02/2018 Comorbid Arrhythmia, Coronary Artery Disease, Weeks Of Treatment: 10 History: Type II Diabetes Clustered Wound: No Photos Wound Measurements Length: (cm) 2.8 % Reduct Width: (cm) 1.5 % Reduct Depth: (cm) 0.1 Epitheli Area: (cm) 3.299 Tunneli Volume: (cm) 0.33 Undermi Wound Description Full Thickness Without Exposed Support Foul Odo Classification: Structures Slough/F Wound Flat and Intact Margin: Exudate Medium Amount: Exudate Serosanguineous Type: Exudate red, brown Color: Wound Bed Granulation Amount: Large (67-100%) Granulation Quality: Red, Hyper-granulation Fascia E Necrotic Amount: None Present (0%) Fat Laye Tendon E Muscle E Joint Ex Bone Exp r After Cleansing: No ibrino No Exposed Structure xposed: No r (Subcutaneous Tissue) Exposed: Yes xposed: No xposed: No posed: No osed: No ion in Area: -289% ion in Volume: -288.2% alization: None ng: No ning: No Electronic Signature(s) Signed: 02/05/2019 8:17:51 AM By: Mikeal Hawthorne EMT/HBOT Signed: 02/10/2019 6:55:12 PM By: [name] RN, BSN Entered By: Mikeal Hawthorne on 02/05/2019 08:08:43 -------------------------------------------------------------------------------- Vitals Details Patient Name: Date of Service: [name]. 02/04/2019 11:15 AM Medical Record [id number] Patient Account Number: [id number] Date of Birth/Sex: Treating RN: 07-18-1950 (68 y.o. [name] Primary Care Ipek Westra: PATIENT, NO Other Clinician: Referring Solan Vosler: Treating Vaidehi Braddy/Extender:Robson, [name], Designated Weeks in Treatment: 10 Vital Signs Time Taken: 11:40 Temperature (F): 99.1 Height (in): 73 Pulse (bpm): 90 Weight (lbs): 205 Respiratory Rate (breaths/min): 16 Body Mass Index (BMI): 27 Blood Pressure (mmHg): 175/106 Reference Range: 80 - 120 mg / dl Electronic  Signature(s) Signed: 05/11/2019 3:04:24 PM By: [name] Entered By: [name] on 02/04/2019 11:40:46

## 2019-05-11 NOTE — Progress Notes (Signed)
[name] ([id number]) Visit Report for 02/19/2019 Arrival Information Details Patient Name: Date of Service: [name], [name] 02/19/2019 8:30 AM Medical Record [id number] Patient Account Number: [id number] Date of Birth/Sex: Treating RN: 13-Dec-1950 (68 y.o. [name]) Carlene Coria Primary Care Rachel Samples: PATIENT, NO Other Clinician: Referring Ludene Stokke: Treating Jarmarcus Wambold/Extender:Robson, [name], Designated Weeks in Treatment: 13 Visit Information History Since Last Visit All ordered tests and consults were completed: No Patient Arrived: [name] Added or deleted any medications: No Arrival Time: 08:29 Any new allergies or adverse reactions: No Accompanied By: self Had a fall or experienced change in No Transfer Assistance: None activities of daily living that may affect Patient Identification Verified: Yes risk of falls: Secondary Verification Process Yes Signs or symptoms of abuse/neglect since last No Completed: visito Patient Requires Transmission- No Hospitalized since last visit: No Based Precautions: Implantable device outside of the clinic excluding No Patient Has Alerts: Yes ABIs 08/2018 L1.05 cellular tissue based products placed in the center Patient Alerts: since last visit: R0.76 Has Dressing in Place as Prescribed: Yes Has Compression in Place as Prescribed: Yes Pain Present Now: No Electronic Signature(s) Signed: 05/11/2019 3:04:22 PM By: Carlene Coria RN Entered By: Carlene Coria on 02/19/2019 08:30:20 -------------------------------------------------------------------------------- Compression Therapy Details Patient Name: Date of Service: [name] 02/19/2019 8:30 AM Medical Record [id number] Patient Account Number: [id number] Date of Birth/Sex: Treating RN: 1950/09/09 (68 y.o. [name]) Carlene Coria Primary Care Kierstan Auer: PATIENT, NO Other Clinician: Referring Julio Zappia: Treating Darrio Bade/Extender:Robson, [name],  Designated Weeks in Treatment: 13 Compression Therapy Performed for Wound Wound #4 Left,Lateral Malleolus Assessment: Performed By: Clinician Carlene Coria, RN Compression Type: Three Layer Electronic Signature(s) Signed: 05/11/2019 3:04:22 PM By: Carlene Coria RN Entered By: Carlene Coria on 02/19/2019 08:59:49 -------------------------------------------------------------------------------- Compression Therapy Details Patient Name: Date of Service: [name] 02/19/2019 8:30 AM Medical Record [id number] Patient Account Number: [id number] Date of Birth/Sex: Treating RN: 1951-01-12 (68 y.o. [name]) Carlene Coria Primary Care Maddux Vanscyoc: PATIENT, NO Other Clinician: Referring Turner Baillie: Treating Shauntel Prest/Extender:Robson, [name], Designated Weeks in Treatment: 13 Compression Therapy Performed for Wound Wound #5 Left,Posterior Calf Assessment: Performed By: Jake Church, RN Compression Type: Three Layer Electronic Signature(s) Signed: 05/11/2019 3:04:22 PM By: Carlene Coria RN Entered By: Carlene Coria on 02/19/2019 08:59:49 -------------------------------------------------------------------------------- Encounter Discharge Information Details Patient Name: Date of Service: Michael Heidelberg. 02/19/2019 8:30 AM Medical Record [id number] Patient Account Number: [id number] Date of Birth/Sex: Treating RN: 1951-03-19 (68 y.o. [name]) Carlene Coria Primary Care Yue Flanigan: PATIENT, NO Other Clinician: Referring Khristian Phillippi: Treating Lofton Leon/Extender:Robson, [name], Designated Weeks in Treatment: 13 Encounter Discharge Information Items Discharge Condition: Stable Ambulatory Status: Cane Discharge Destination: Home Transportation: Private Auto Accompanied By: self Schedule Follow-up Appointment: Yes Clinical Summary of Care: Patient Declined Electronic Signature(s) Signed: 05/11/2019 3:04:22 PM By: Carlene Coria RN Entered By: Carlene Coria on 02/19/2019  08:51:16 -------------------------------------------------------------------------------- Patient/Caregiver Education Details Patient Name: Date of Service: [name], [name] 9/18/2020andnbsp8:30 AM Medical Record [id number] Patient Account Number: [id number] Date of Birth/Gender: Treating RN: February 20, 1951 (68 y.o. [name]) Carlene Coria Primary Care Physician: PATIENT, NO Other Clinician: Referring Physician: Treating Physician/Extender:Robson, [name], Designated Weeks in Treatment: 13 Education Assessment Education Provided To: Patient Education Topics Provided Malignant/Atypical Wounds: Handouts: Malignant/Atypical Wounds Methods: Explain/Verbal Responses: Reinforcements needed Venous: Handouts: Controlling Swelling with Multilayered Compression Wraps Methods: Explain/Verbal Responses: Reinforcements needed Electronic Signature(s) Signed: 05/11/2019 3:04:22 PM By: Carlene Coria RN Entered By: Carlene Coria on 02/19/2019 08:43:35 -------------------------------------------------------------------------------- Wound Assessment Details Patient Name: Date of Service: [name], [name] 02/19/2019 8:30 AM Medical Record  [id number] Patient Account Number: [id number] Date of Birth/Sex: Treating RN: 01/10/1951 (68 y.o. [name]) Carlene Coria Primary Care Brynn Reznik: PATIENT, NO Other Clinician: Referring Esaiah Wanless: Treating Aluel Schwarz/Extender:Robson, [name], Designated Weeks in Treatment: 13 Wound Status Wound Number: 4 Primary Venous Leg Ulcer Etiology: Wound Location: Left Malleolus - Lateral Wound Open Wounding Event: Trauma Status: Status: Date Acquired: 11/02/2018 Comorbid Arrhythmia, Coronary Artery Disease, Weeks Of Treatment: 13 History: Type II Diabetes Clustered Wound: No Wound Measurements Length: (cm) 1.1 % Reduct Width: (cm) 1 % Reduct Depth: (cm) 0.6 Epitheli Area: (cm) 0.864 Tunneli Volume: (cm) 0.518 Undermi Wound Description Full Thickness  Without Exposed Support Foul Od Classification: Structures Slough/ Wound Flat and Intact Margin: Exudate Small Amount: Exudate Serosanguineous Type: Exudate red, brown Color: Wound Bed Granulation Amount: Medium (34-66%) Granulation Quality: Pink Fascia E Necrotic Amount: Medium (34-66%) Fat Laye Necrotic Quality: Adherent Slough Tendon E Muscle E Joint Ex Bone Exp or After Cleansing: No Fibrino Yes Exposed Structure xposed: No r (Subcutaneous Tissue) Exposed: Yes xposed: No xposed: No posed: No osed: No ion in Area: 33.3% ion in Volume: -298.5% alization: Small (1-33%) ng: No ning: No Electronic Signature(s) Signed: 05/11/2019 3:04:22 PM By: Carlene Coria RN Entered By: Carlene Coria on 02/19/2019 08:37:58 -------------------------------------------------------------------------------- Wound Assessment Details Patient Name: Date of Service: [name], [name] 02/19/2019 8:30 AM Medical Record [id number] Patient Account Number: [id number] Date of Birth/Sex: Treating RN: 12/10/1950 (68 y.o. [name]) Carlene Coria Primary Care Zuhayr Deeney: PATIENT, NO Other Clinician: Referring Khyri Hinzman: Treating Maicol Bowland/Extender:Robson, [name], Designated Weeks in Treatment: 13 Wound Status Wound Number: 5 Primary Atypical Etiology: Wound Location: Left Calf - Posterior Wound Open Wounding Event: Gradually Appeared Status: Date Acquired: 10/02/2018 Comorbid Arrhythmia, Coronary Artery Disease, Weeks Of Treatment: 13 History: Type II Diabetes Clustered Wound: No Wound Measurements Length: (cm) 2.7 % Reduct Width: (cm) 1.5 % Reduct Depth: (cm) 0.1 Epitheli Area: (cm) 3.181 Volume: (cm) 0.318 Wound Description Classification: Full Thickness Without Exposed Support Foul Od Structures Slough/ Wound Flat and Intact Margin: Exudate Medium Amount: Exudate Serosanguineous Type: Exudate red, brown Color: Wound Bed Granulation Amount: Large  (67-100%) Granulation Quality: Red, Hyper-granulation Fascia E Necrotic Amount: None Present (0%) Fat Laye Tendon E Muscle E Joint Ex Bone Exp or After Cleansing: No Fibrino No Exposed Structure xposed: No r (Subcutaneous Tissue) Exposed: Yes xposed: No xposed: No posed: No osed: No ion in Area: -275.1% ion in Volume: -274.1% alization: None Electronic Signature(s) Signed: 05/11/2019 3:04:22 PM By: Carlene Coria RN Entered By: Carlene Coria on 02/19/2019 08:36:45 -------------------------------------------------------------------------------- Vitals Details Patient Name: Date of Service: Michael Heidelberg. 02/19/2019 8:30 AM Medical Record [id number] Patient Account Number: [id number] Date of Birth/Sex: Treating RN: November 12, 1950 (68 y.o. [name]) Carlene Coria Primary Care Bryn Perkin: PATIENT, NO Other Clinician: Referring Corin Tilly: Treating Onica Davidovich/Extender:Robson, [name], Designated Weeks in Treatment: 13 Vital Signs Time Taken: 08:30 Temperature (F): 98.3 Height (in): 73 Pulse (bpm): 68 Weight (lbs): 205 Respiratory Rate (breaths/min): 19 Body Mass Index (BMI): 27 Blood Pressure (mmHg): 163/78 Reference Range: 80 - 120 mg / dl Electronic Signature(s) Signed: 05/11/2019 3:04:22 PM By: Carlene Coria RN Entered By: Carlene Coria on 02/19/2019 08:32:34

## 2019-05-11 NOTE — Progress Notes (Signed)
[name], [name] ([id number]) Visit Report for 02/19/2019 SuperBill Details Patient Name: Date of Service: [name], [name] 02/19/2019 Medical Record [id number] Patient Account Number: [id number] Date of Birth/Sex: Treating RN: 07/13/1950 (68 y.o. 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

## 2019-05-11 NOTE — Progress Notes (Signed)
KINGSTEN, ENFIELD ([id number]) Visit Report for 04/09/2019 Arrival Information Details Patient Name: Date of Service: [name], [name] 04/09/2019 10:30 AM Medical Record [id number] Patient Account Number: [id number] Date of Birth/Sex: Treating RN: 1951/02/04 (68 y.o. M) Primary Care Provider: PATIENT, NO Other Clinician: Referring Provider: Treating Provider/Extender:Robson, [name], Designated Weeks in Treatment: 20 Visit Information History Since Last Visit Added or deleted any medications: No Patient Arrived: [name] Any new allergies or adverse reactions: No Arrival Time: 10:58 Had a fall or experienced change in No Accompanied By: self activities of daily living that may affect Transfer Assistance: None risk of falls: Patient Identification Verified: Yes Signs or symptoms of abuse/neglect since last No Secondary Verification Process Yes visito Completed: Hospitalized since last visit: No Patient Requires Transmission- No Implantable device outside of the clinic excluding No Based Precautions: cellular tissue based products placed in the center Patient Has Alerts: Yes ABIs 08/2018 L1.05 since last visit: Patient Alerts: Has Dressing in Place as Prescribed: Yes R0.76 Pain Present Now: Yes Electronic Signature(s) Signed: 05/11/2019 3:00:22 PM By: [name] Entered By: [name] on 04/09/2019 10:58:56 -------------------------------------------------------------------------------- Compression Therapy Details Patient Name: Date of Service: [name] 04/09/2019 10:30 AM Medical Record [id number] Patient Account Number: [id number] Date of Birth/Sex: Treating RN: [birth year] (68 y.o. [name] Primary Care Provider: PATIENT, NO Other Clinician: Referring Provider: Treating Provider/Extender:Robson, [name], Designated Weeks in Treatment: 20 Compression Therapy Performed for Wound Wound #4 Left,Lateral  Malleolus Assessment: Performed By: Clinician [name], RN Compression Type: Four Layer Post Procedure Diagnosis Same as Pre-procedure Electronic Signature(s) Signed: 04/09/2019 6:04:56 PM By: [name] Entered By: [name] on 04/09/2019 11:36:28 -------------------------------------------------------------------------------- Encounter Discharge Information Details Patient Name: Date of Service: Michael Post F. 04/09/2019 10:30 AM Medical Record [id number] Patient Account Number: [id number] Date of Birth/Sex: Treating RN: 01-04-1951 (68 y.o. [name] Primary Care Provider: PATIENT, NO Other Clinician: Referring Provider: Treating Provider/Extender:Robson, [name], Designated Weeks in Treatment: 20 Encounter Discharge Information Items Post Procedure Vitals Discharge Condition: Stable Temperature (F): 98.7 Ambulatory Status: Cane Pulse (bpm): 84 Discharge Destination: Home Respiratory Rate (breaths/min): 18 Transportation: Private Auto Blood Pressure (mmHg): 196/94 Accompanied By: self Schedule Follow-up Appointment: Yes Clinical Summary of Care: Electronic Signature(s) Signed: 04/09/2019 5:26:58 PM By: [name] Entered By: [name] on 04/09/2019 11:53:40 -------------------------------------------------------------------------------- Lower Extremity Assessment Details Patient Name: Date of Service: [name] 04/09/2019 10:30 AM Medical Record [id number] Patient Account Number: [id number] Date of Birth/Sex: Treating RN: [birth year] (68 y.o. [name] Primary Care Provider: PATIENT, NO Other Clinician: Referring Provider: Treating Provider/Extender:Robson, [name], Designated Weeks in Treatment: 20 Edema Assessment Assessed: [Left: Yes] [Right: No] Edema: [Left: No] [Right: No] Calf Left: Right: Point of Measurement: 35 cm From Medial Instep 32 cm cm Ankle Left: Right: Point of  Measurement: 9 cm From Medial Instep 24 cm cm Electronic Signature(s) Signed: 04/09/2019 5:26:58 PM By: [name] Entered By: [name] on 04/09/2019 11:15:07 -------------------------------------------------------------------------------- Multi Wound Chart Details Patient Name: Date of Service: [name]. 04/09/2019 10:30 AM Medical Record [id number] Patient Account Number: [id number] Date of Birth/Sex: Treating RN: [birth year] (68 y.o. M) Primary Care Provider: PATIENT, NO Other Clinician: Referring Provider: Treating Provider/Extender:Robson, [name], Designated Weeks in Treatment: 20 Vital Signs Height(in): 73 Pulse(bpm): 50 Weight(lbs): 205 Blood Pressure(mmHg): 196/94 Body Mass Index(BMI): 27 Temperature(F): 98.7 Respiratory 18 Rate(breaths/min): Photos: [4:No Photos] [N/A:N/A] Wound Location: [4:Left Malleolus - Lateral] [N/A:N/A] Wounding Event: [4:Trauma] [N/A:N/A] Primary Etiology: [4:Venous Leg Ulcer] [N/A:N/A] Comorbid History: [4:Arrhythmia, Coronary  Artery N/A Disease, Type II Diabetes] Date Acquired: [4:11/02/2018] [N/A:N/A] Weeks of Treatment: [4:20] [N/A:N/A] Wound Status: [4:Open] [N/A:N/A] Measurements L x W x D 0.7x0.8x0.3 [N/A:N/A] (cm) Area (cm) : [4:0.44] [N/A:N/A] Volume (cm) : [4:0.132] [N/A:N/A] % Reduction in Area: [4:66.00%] [N/A:N/A] % Reduction in Volume: -1.50% [N/A:N/A] Classification: [4:Full Thickness Without Exposed Support Structures] [N/A:N/A] Exudate Amount: [4:Small] [N/A:N/A] Exudate Type: [4:Serosanguineous] [N/A:N/A] Exudate Color: [4:red, brown] [N/A:N/A] Wound Margin: [4:Well defined, not attached N/A] Granulation Amount: [4:Medium (34-66%)] [N/A:N/A] Granulation Quality: [4:Pink, Pale] [N/A:N/A] Necrotic Amount: [4:Medium (34-66%)] [N/A:N/A] Exposed Structures: [4:Fat Layer (Subcutaneous Tissue) Exposed: Yes Fascia: No Tendon: No Muscle: No Joint: No Bone: No] [N/A:N/A] Epithelialization:  [4:Small (1-33%)] [N/A:N/A] Procedures Performed: [4:Cellular or Tissue Based Product Compression Therapy] [N/A:N/A] Treatment Notes Wound #4 (Left, Lateral Malleolus) 1. Cleanse With Wound Cleanser Soap and water 2. Periwound Care TCA Cream 3. Primary Dressing Applied Cellular Based Tissue Product 4. Secondary Dressing Dry Gauze Drawtex 6. Support Layer Applied 4 layer compression wrap Notes primary dressing oasis burn skin substitute applied by MD and secured with adaptic and steri-strips. no kerlix or cotton first layer not applied as ordered. netting. Electronic Signature(s) Signed: 04/11/2019 8:54:13 AM By: Linton Ham MD Entered By: Linton Ham on 04/09/2019 12:48:00 -------------------------------------------------------------------------------- Multi-Disciplinary Care Plan Details Patient Name: Date of Service: [name], Michael 04/09/2019 10:30 AM Medical Record [id number] Patient Account Number: [id number] Date of Birth/Sex: Treating RN: [birth year] (68 y.o. [name] Primary Care Provider: PATIENT, NO Other Clinician: Referring Provider: Treating Provider/Extender:Robson, [name], Designated Weeks in Treatment: 20 Active Inactive Venous Leg Ulcer Nursing Diagnoses: Knowledge deficit related to disease process and management Potential for venous Insuffiency (use before diagnosis confirmed) Goals: Patient will maintain optimal edema control Date Initiated: 11/20/2018 Target Resolution Date: 05/07/2019 Goal Status: Active Patient/caregiver will verbalize understanding of disease process and disease management Date Initiated: 11/20/2018 Date Inactivated: 12/17/2018 Target Resolution Date: 12/18/2018 Goal Status: Met Interventions: Assess peripheral edema status every visit. Compression as ordered Provide education on venous insufficiency Treatment Activities: Therapeutic compression applied : 11/20/2018 Notes: Electronic  Signature(s) Signed: 04/09/2019 6:04:56 PM By: [name] Entered By: [name] on 04/09/2019 11:04:54 -------------------------------------------------------------------------------- Pain Assessment Details Patient Name: Date of Service: [name], Michael 04/09/2019 10:30 AM Medical Record [id number] Patient Account Number: [id number] Date of Birth/Sex: Treating RN: 1950-08-03 (68 y.o. M) Primary Care Provider: PATIENT, NO Other Clinician: Referring Provider: Treating Provider/Extender:Robson, [name], Designated Weeks in Treatment: 20 Active Problems Location of Pain Severity and Description of Pain Patient Has Paino Yes Site Locations Rate the pain. Current Pain Level: 6 Pain Management and Medication Current Pain Management: Electronic Signature(s) Signed: 05/11/2019 3:00:22 PM By: [name] Entered By: [name] on 04/09/2019 10:59:25 -------------------------------------------------------------------------------- Patient/Caregiver Education Details Patient Name: Date of Service: [name] 11/6/2020andnbsp10:30 AM Medical Record [id number] Patient Account Number: [id number] Date of Birth/Gender: Treating RN: 30-May-1951 (68 y.o. [name] Primary Care Physician: PATIENT, NO Other Clinician: Referring Physician: Treating Physician/Extender:Robson, [name], Designated Weeks in Treatment: 20 Education Assessment Education Provided To: Patient Education Topics Provided Venous: Methods: Explain/Verbal Responses: State content correctly Electronic Signature(s) Signed: 04/09/2019 6:04:56 PM By: [name] Entered By: [name] on 04/09/2019 11:05:18 -------------------------------------------------------------------------------- Wound Assessment Details Patient Name: Date of Service: [name], [name] 04/09/2019 10:30 AM Medical Record [id number] Patient Account Number:  [id number] Date of Birth/Sex: Treating RN: [birth year] (68 y.o. M) Primary Care Provider: PATIENT, NO Other Clinician: Referring Provider: Treating Provider/Extender:Robson, [name], Designated Weeks in Treatment: 20 Wound Status Wound Number: 4  Primary Venous Leg Ulcer Etiology: Wound Location: Left Malleolus - Lateral Wound Open Wound Open Wounding Event: Trauma Status: Date Acquired: 11/02/2018 Comorbid Arrhythmia, Coronary Artery Disease, Weeks Of Treatment: 20 History: Type II Diabetes Clustered Wound: No Photos Wound Measurements Length: (cm) 0.7 % Reduct Width: (cm) 0.8 % Reduct Depth: (cm) 0.3 Epitheli Area: (cm) 0.44 Tunneli Volume: (cm) 0.132 Undermi Wound Description Classification: Full Thickness Without Exposed Support Structures Wound Well defined, not attached Margin: Exudate Small Amount: Exudate Serosanguineous Type: Exudate red, brown Color: Wound Bed Granulation Amount: Medium (34-66%) Granulation Quality: Pink, Pale Necrotic Amount: Medium (34-66%) Necrotic Quality: Adherent Slough Foul Odor After Cleansing: No Slough/Fibrino Yes Exposed Structure Fascia Exposed: No Fat Layer (Subcutaneous Tissue) Exposed: Yes Tendon Exposed: No Muscle Exposed: No Joint Exposed: No Bone Exposed: No ion in Area: 66% ion in Volume: -1.5% alization: Small (1-33%) ng: No ning: No Electronic Signature(s) Signed: 04/14/2019 4:27:20 PM By: Mikeal Hawthorne EMT/HBOT Previous Signature: 04/09/2019 5:26:58 PM Version By: [name] Entered By: Mikeal Hawthorne on 04/14/2019 13:38:49 -------------------------------------------------------------------------------- Vitals Details Patient Name: Date of Service: Michael Post F. 04/09/2019 10:30 AM Medical Record [id number] Patient Account Number: [id number] Date of Birth/Sex: Treating RN: 1950/10/25 (68 y.o. M) Primary Care Provider: PATIENT, NO Other Clinician: Referring Provider: Treating  Provider/Extender:Robson, [name], Designated Weeks in Treatment: 20 Vital Signs Time Taken: 10:58 Temperature (F): 98.7 Height (in): 73 Pulse (bpm): 84 Weight (lbs): 205 Respiratory Rate (breaths/min): 18 Body Mass Index (BMI): 27 Blood Pressure (mmHg): 196/94 Reference Range: 80 - 120 mg / dl Electronic Signature(s) Signed: 05/11/2019 3:00:22 PM By: [name] Entered By: [name] on 04/09/2019 10:59:15

## 2019-05-11 NOTE — Progress Notes (Signed)
[name], [name] ([id number]) Visit Report for 02/11/2019 Arrival Information Details Patient Name: Date of Service: Michael, [name] 02/11/2019 3:00 PM Medical Record [id number] Patient Account Number: [id number] Date of Birth/Sex: Treating RN: 1950/10/16 (68 y.o. [name]) Carlene Coria Primary Care Alaiza Yau: PATIENT, NO Other Clinician: Referring Dupree Givler: Treating Nakeia Calvi/Extender:Madduri, Johnsie Cancel, Designated Weeks in Treatment: 11 Visit Information History Since Last Visit All ordered tests and consults were completed: No Patient Arrived: Ambulatory Added or deleted any medications: No Arrival Time: 15:42 Any new allergies or adverse reactions: No Accompanied By: self Had a fall or experienced change in No Transfer Assistance: None activities of daily living that may affect Patient Identification Verified: Yes risk of falls: Secondary Verification Process Yes Signs or symptoms of abuse/neglect since last No Completed: visito Patient Requires Transmission- No Hospitalized since last visit: No Based Precautions: Implantable device outside of the clinic excluding No Patient Has Alerts: Yes ABIs 08/2018 L1.05 cellular tissue based products placed in the center Patient Alerts: since last visit: R0.76 Has Dressing in Place as Prescribed: Yes Has Compression in Place as Prescribed: Yes Pain Present Now: No Electronic Signature(s) Signed: 05/11/2019 3:04:22 PM By: Carlene Coria RN Entered By: Carlene Coria on 02/11/2019 15:48:31 -------------------------------------------------------------------------------- Compression Therapy Details Patient Name: Date of Service: Michael, [name] 02/11/2019 3:00 PM Medical Record [id number] Patient Account Number: [id number] Date of Birth/Sex: Treating RN: [birth year] (68 y.o. [name] Primary Care Kateria Cutrona: PATIENT, NO Other Clinician: Referring Larina Lieurance: Treating Doneta Bayman/Extender:Madduri, Johnsie Cancel,  Designated Weeks in Treatment: 11 Compression Therapy Performed for Wound Wound #4 Left,Lateral Malleolus Assessment: Performed By: Clinician [name], RN Compression Type: Three Layer Pre Treatment ABI: 1 Post Procedure Diagnosis Same as Pre-procedure Electronic Signature(s) Signed: 02/11/2019 6:15:18 PM By: [name] Entered By: [name] on 02/11/2019 17:09:39 -------------------------------------------------------------------------------- Compression Therapy Details Patient Name: Date of Service: Michael, [name] 02/11/2019 3:00 PM Medical Record [id number] Patient Account Number: [id number] Date of Birth/Sex: Treating RN: July 18, 1950 (68 y.o. [name] Primary Care Gotham Raden: PATIENT, NO Other Clinician: Referring Liona Wengert: Treating Kalman Nylen/Extender:Madduri, Johnsie Cancel, Designated Weeks in Treatment: 11 Compression Therapy Performed for Wound Wound #5 Left,Posterior Calf Assessment: Performed By: Clinician [name], RN Compression Type: Three Layer Pre Treatment ABI: 1 Post Procedure Diagnosis Same as Pre-procedure Electronic Signature(s) Signed: 02/11/2019 6:15:18 PM By: [name] Entered By: [name] on 02/11/2019 17:09:39 -------------------------------------------------------------------------------- Encounter Discharge Information Details Patient Name: Date of Service: Michael, [name] 02/11/2019 3:00 PM Medical Record [id number] Patient Account Number: [id number] Date of Birth/Sex: Treating RN: 05/21/1951 (68 y.o. [name] Primary Care Lucresia Simic: PATIENT, NO Other Clinician: Referring Estalee Mccandlish: Treating Areen Trautner/Extender:Madduri, Johnsie Cancel, Designated Weeks in Treatment: 11 Encounter Discharge Information Items Post Procedure Vitals Discharge Condition: Stable Temperature (F): 98.6 Ambulatory Status: Ambulatory Pulse (bpm): 83 Discharge Destination: Home Respiratory Rate (breaths/min):  16 Transportation: Private Auto Blood Pressure (mmHg): 180/60 Accompanied By: self Schedule Follow-up Appointment: Yes Clinical Summary of Care: Notes Explained to patient would send referrals to [facility] and [facility] related to needing to be seen and evaluated to the right posterior calf wound. Per patient closest appointment for skin surgery center had in [place] for him was March 10, 2019. Per patient no one from their office called him concerning his appointment on Tuesday February 09, 2019. Explained the front office here phoned and left voicemails to return our call last Thursday and Friday about his appointment with [facility] as well. Per patient called skin surgery center and scheduled an appointment as soon as March 10, 2019. Electronic Signature(s) Signed: 02/11/2019 6:15:18 PM By: [name] Entered By: [name] on 02/11/2019 17:15:05 -------------------------------------------------------------------------------- Lower Extremity Assessment Details Patient Name: Date of Service: Michael, [name] 02/11/2019 3:00 PM Medical Record [id number] Patient Account Number: [id number] Date of Birth/Sex: Treating RN: 02-20-1951 (68 y.o. [name]) Carlene Coria Primary Care Sergey Ishler: PATIENT, NO Other Clinician: Referring Burma Ketcher: Treating Jaquel Coomer/Extender:Madduri, Odelia Gage None, Designated Weeks in Treatment: 11 Edema Assessment Assessed: [Left: No] [Right: No] Edema: [Left: Yes] [Right: No] Calf Left: Right: Point of Measurement: 35 cm From Medial Instep 34 cm cm Ankle Left: Right: Point of Measurement: 9 cm From Medial Instep 25 cm cm Electronic Signature(s) Signed: 05/11/2019 3:04:22 PM By: Carlene Coria RN Entered By: Carlene Coria on 02/11/2019 15:55:12 -------------------------------------------------------------------------------- Pain Assessment Details Patient Name: Date of Service: Michael, [name] 02/11/2019 3:00 PM Medical  Record [id number] Patient Account Number: [id number] Date of Birth/Sex: Treating RN: 1951-03-05 (68 y.o. [name]) Carlene Coria Primary Care Arkie Tagliaferro: PATIENT, NO Other Clinician: Referring Maureena Dabbs: Treating Kassidie Hendriks/Extender:Madduri, Johnsie Cancel, Designated Weeks in Treatment: 11 Active Problems Location of Pain Severity and Description of Pain Patient Has Paino Yes Site Locations Pain Location: Pain in Ulcers With Dressing Change: Yes Duration of the Pain. Constant / Intermittento Constant Rate the pain. Current Pain Level: 4 Worst Pain Level: 6 Least Pain Level: 3 Tolerable Pain Level: 5 Character of Pain Describe the Pain: Burning, Stabbing Pain Management and Medication Current Pain Management: Medication: Yes Cold Application: No Rest: Yes Massage: No Activity: No T.E.N.S.: No Heat Application: No Leg drop or elevation: No Is the Current Pain Management Adequate: Inadequate How does your wound impact your activities of daily livingo Sleep: Yes Bathing: No Appetite: No Relationship With Others: No Bladder Continence: No Emotions: No Bowel Continence: No Work: No Toileting: No Drive: No Dressing: No Hobbies: No Electronic Signature(s) Signed: 05/11/2019 3:04:22 PM By: Carlene Coria RN Entered By: Carlene Coria on 02/11/2019 15:54:30 -------------------------------------------------------------------------------- Wound Assessment Details Patient Name: Date of Service: Michael, [name] 02/11/2019 3:00 PM Medical Record [id number] Patient Account Number: [id number] Date of Birth/Sex: Treating RN: 1950/09/02 (68 y.o. [name]) Carlene Coria Primary Care Jonay Hitchcock: PATIENT, NO Other Clinician: Referring Mahnoor Mathisen: Treating Ladarrious Kirksey/Extender:Madduri, Odelia Gage None, Designated Weeks in Treatment: 11 Wound Status Wound Number: 4 Primary Venous Leg Ulcer Etiology: Wound Location: Left Malleolus - Lateral Wound Open Wounding Event: Trauma Status: Date Acquired:  11/02/2018 Comorbid Arrhythmia, Coronary Artery Disease, Weeks Of Treatment: 11 History: Type II Diabetes Clustered Wound: No Photos Wound Measurements Length: (cm) 1.1 % Reduct Width: (cm) 1 % Reduct Depth: (cm) 0.6 Epitheli Area: (cm) 0.864 Tunneli Volume: (cm) 0.518 Undermi Wound Description Classification: Full Thickness Without Exposed Support Foul Od Structures Slough/ Wound Flat and Intact Margin: Exudate Small Amount: Exudate Serosanguineous Type: Exudate red, brown Color: Wound Bed Granulation Amount: Small (1-33%) Granulation Quality: Pink Fascia E Necrotic Amount: Large (67-100%) Fat Laye Necrotic Quality: Adherent Slough Tendon E Muscle E Joint Ex Bone Exp or After Cleansing: No Fibrino Yes Exposed Structure xposed: No r (Subcutaneous Tissue) Exposed: Yes xposed: No xposed: No posed: No osed: No ion in Area: 33.3% ion in Volume: -298.5% alization: Small (1-33%) ng: No ning: No Electronic Signature(s) Signed: 02/12/2019 5:32:36 PM By: Mikeal Hawthorne EMT/HBOT Signed: 05/11/2019 3:04:22 PM By: Carlene Coria RN Entered By: Mikeal Hawthorne on 02/12/2019 14:42:19 -------------------------------------------------------------------------------- Wound Assessment Details Patient Name: Date of Service: [name]. 02/11/2019 3:00 PM Medical Record [id number] Patient Account Number: [id number] Date of Birth/Sex: Treating RN: [birth year] (68 y.o. [name]) Carlene Coria Primary  Care Ammie Warrick: PATIENT, NO Other Clinician: Referring Maniyah Moller: Treating Willene Holian/Extender:Madduri, Odelia Gage None, Designated Weeks in Treatment: 11 Wound Status Wound Number: 5 Primary Atypical Etiology: Wound Location: Left Calf - Posterior Wound Open Wounding Event: Gradually Appeared Status: Date Acquired: 10/02/2018 Comorbid Arrhythmia, Coronary Artery Disease, Weeks Of Treatment: 11 History: Type II Diabetes Clustered Wound: No Photos Wound Measurements Length:  (cm) 2.7 % Reduct Width: (cm) 1.5 % Reduct Depth: (cm) 0.1 Epitheli Area: (cm) 3.181 Tunneli Volume: (cm) 0.318 Undermi Wound Description Full Thickness Without Exposed Support Foul Od Classification: Structures Slough/ Wound Flat and Intact Margin: Exudate Medium Amount: Exudate Serosanguineous Type: Exudate red, brown Color: Wound Bed Granulation Amount: Large (67-100%) Granulation Quality: Red, Hyper-granulation Fascia E Necrotic Amount: None Present (0%) Fat Laye Tendon E Muscle E Joint Ex Bone Exp Electronic Signature(s) Signed: 02/12/2019 5:32:36 PM By: Mikeal Hawthorne EMT/HBOT Signed: 05/11/2019 3:04:22 PM By: Carlene Coria RN Entered By: Mikeal Hawthorne on 09/11/ or After Cleansing: No Fibrino No Exposed Structure xposed: No r (Subcutaneous Tissue) Exposed: Yes xposed: No xposed: No posed: No osed: No 2020 14:42:41 ion in Area: -275.1% ion in Volume: -274.1% alization: None ng: No ning: No -------------------------------------------------------------------------------- Vitals Details Patient Name: Date of Service: Michael, [name] 02/11/2019 3:00 PM Medical Record [id number] Patient Account Number: [id number] Date of Birth/Sex: Treating RN: [birth year] (68 y.o. [name]) Carlene Coria Primary Care Kirston Luty: PATIENT, NO Other Clinician: Referring Amond Speranza: Treating Cecilee Rosner/Extender:Madduri, Johnsie Cancel, Designated Weeks in Treatment: 11 Vital Signs Time Taken: 03:48 Temperature (F): 98.6 Height (in): 73 Pulse (bpm): 83 Weight (lbs): 205 Respiratory Rate (breaths/min): 18 Body Mass Index (BMI): 27 Blood Pressure (mmHg): 180/90 Reference Range: 80 - 120 mg / dl Electronic Signature(s) Signed: 05/11/2019 3:04:22 PM By: Carlene Coria RN Entered By: Carlene Coria on 02/11/2019 15:53:39

## 2019-05-11 NOTE — Progress Notes (Signed)
[name], ROYCE ([id number]) Visit Report for 04/16/2019 Arrival Information Details Patient Name: Date of Service: [name], [name] 04/16/2019 9:30 AM Medical Record [id number] Patient Account Number: [id number] Date of Birth/Sex: Treating RN: Jan 03, 1951 (68 y.o. [name] Primary Care Bethzaida Boord: PATIENT, NO Other Clinician: Referring Jumaane Weatherford: Treating Becka Lagasse/Extender:Robson, [name], Designated Weeks in Treatment: 21 Visit Information History Since Last Visit Added or deleted any medications: No Patient Arrived: Ambulatory Any new allergies or adverse reactions: No Arrival Time: 09:41 Had a fall or experienced change in No Accompanied By: self activities of daily living that may affect Transfer Assistance: None risk of falls: Patient Identification Verified: Yes Signs or symptoms of abuse/neglect since last No Secondary Verification Process Yes visito Completed: Hospitalized since last visit: No Patient Requires Transmission- No Implantable device outside of the clinic excluding No Based Precautions: cellular tissue based products placed in the center Patient Has Alerts: Yes ABIs 08/2018 L1.05 since last visit: Patient Alerts: Has Dressing in Place as Prescribed: Yes R0.76 Pain Present Now: Yes Electronic Signature(s) Signed: 05/11/2019 3:00:22 PM By: [name] Entered By: [name] on 04/16/2019 09:42:15 -------------------------------------------------------------------------------- Compression Therapy Details Patient Name: Date of Service: [name], [name] 04/16/2019 9:30 AM Medical Record [id number] Patient Account Number: [id number] Date of Birth/Sex: Treating RN: 1950/11/27 (68 y.o. [name] Primary Care Riley Hallum: PATIENT, NO Other Clinician: Referring Davey Limas: Treating Jandiel Magallanes/Extender:Robson, [name], Designated Weeks in Treatment: 21 Compression Therapy Performed for Wound Wound #4  Left,Lateral Malleolus Assessment: Performed By: Clinician [name], RN Compression Type: Four Layer Post Procedure Diagnosis Same as Pre-procedure Electronic Signature(s) Signed: 04/16/2019 5:20:59 PM By: [name] Entered By: [name] on 04/16/2019 10:35:50 -------------------------------------------------------------------------------- Encounter Discharge Information Details Patient Name: Date of Service: JIYAN, [name] 04/16/2019 9:30 AM Medical Record [id number] Patient Account Number: [id number] Date of Birth/Sex: Treating RN: December 08, 1950 (68 y.o. [name] Primary Care Latonya Nelon: PATIENT, NO Other Clinician: Referring Zoye Chandra: Treating Halcyon Heck/Extender:Robson, [name], Designated Weeks in Treatment: 21 Encounter Discharge Information Items Post Procedure Vitals Discharge Condition: Stable Temperature (F): 98.2 Ambulatory Status: Cane Pulse (bpm): 88 Discharge Destination: Home Respiratory Rate (breaths/min): 18 Transportation: Private Auto Blood Pressure (mmHg): 130/72 Accompanied By: self Schedule Follow-up Appointment: Yes Clinical Summary of Care: Electronic Signature(s) Signed: 04/16/2019 5:02:36 PM By: [name] Entered By: [name] on 04/16/2019 10:58:52 -------------------------------------------------------------------------------- Lower Extremity Assessment Details Patient Name: Date of Service: [name], [name] 04/16/2019 9:30 AM Medical Record [id number] Patient Account Number: [id number] Date of Birth/Sex: Treating RN: 11-Dec-1950 (68 y.o. [name]) Carlene Coria Primary Care Athaliah Baumbach: PATIENT, NO Other Clinician: Referring Leontine Radman: Treating Kennett Symes/Extender:Robson, [name], Designated Weeks in Treatment: 21 Edema Assessment Assessed: [Left: No] [Right: No] Edema: [Left: No] [Right: No] Calf Left: Right: Point of Measurement: 35 cm From Medial Instep 32 cm cm Ankle Left: Right: Point  of Measurement: 9 cm From Medial Instep 23 cm cm Electronic Signature(s) Signed: 05/11/2019 2:49:42 PM By: Carlene Coria RN Entered By: Carlene Coria on 04/16/2019 10:10:02 -------------------------------------------------------------------------------- Multi Wound Chart Details Patient Name: Date of Service: [name] 04/16/2019 9:30 AM Medical Record [id number] Patient Account Number: [id number] Date of Birth/Sex: Treating RN: 1951/05/04 (68 y.o. [name] Primary Care Harlan Vinal: PATIENT, NO Other Clinician: Referring Samiksha Pellicano: Treating Montavius Subramaniam/Extender:Robson, [name], Designated Weeks in Treatment: 21 Vital Signs Height(in): 73 Pulse(bpm): 88 Weight(lbs): 205 Blood Pressure(mmHg): Body Mass Index(BMI): 27 Temperature(F): 98.2 Respiratory 18 Rate(breaths/min): Photos: [4:No Photos] [N/A:N/A] Wound Location: [4:Left Malleolus - Lateral] [N/A:N/A] Wounding Event: [4:Trauma] [N/A:N/A] Primary Etiology: [4:Venous Leg Ulcer] [N/A:N/A]  Comorbid History: [4:Arrhythmia, Coronary Artery N/A Disease, Type II Diabetes] Date Acquired: [4:11/02/2018] [N/A:N/A] Weeks of Treatment: [4:21] [N/A:N/A] Wound Status: [4:Open] [N/A:N/A] Measurements L x W x D 0.7x0.7x0.3 [N/A:N/A] (cm) Area (cm) : [4:0.385] [N/A:N/A] Volume (cm) : [4:0.115] [N/A:N/A] % Reduction in Area: [4:70.30%] [N/A:N/A] % Reduction in Volume: 11.50% [N/A:N/A] Classification: [4:Full Thickness Without Exposed Support Structures] [N/A:N/A] Exudate Amount: [4:Medium] [N/A:N/A] Exudate Type: [4:Serosanguineous] [N/A:N/A] Exudate Color: [4:red, brown] [N/A:N/A] Wound Margin: [4:Well defined, not attached N/A] Granulation Amount: [4:Medium (34-66%)] [N/A:N/A] Granulation Quality: [4:Pink, Pale] [N/A:N/A] Necrotic Amount: [4:Medium (34-66%)] [N/A:N/A] Exposed Structures: [4:Fat Layer (Subcutaneous Tissue) Exposed: Yes Fascia: No Tendon: No Muscle: No Joint: No Bone: No]  [N/A:N/A] Epithelialization: [4:Small (1-33%) Compression Therapy] [N/A:N/A N/A] Treatment Notes Electronic Signature(s) Signed: 04/16/2019 5:20:59 PM By: [name] Signed: 04/16/2019 5:33:43 PM By: Linton Ham MD Entered By: Linton Ham on 04/16/2019 10:42:28 -------------------------------------------------------------------------------- Multi-Disciplinary Care Plan Details Patient Name: Date of Service: [name] 04/16/2019 9:30 AM Medical Record [id number] Patient Account Number: [id number] Date of Birth/Sex: Treating RN: 1951/03/24 (68 y.o. [name] Primary Care Isauro Skelley: PATIENT, NO Other Clinician: Referring Bayley Hurn: Treating Chenee Munns/Extender:Robson, [name], Designated Weeks in Treatment: 21 Active Inactive Venous Leg Ulcer Nursing Diagnoses: Knowledge deficit related to disease process and management Potential for venous Insuffiency (use before diagnosis confirmed) Goals: Patient will maintain optimal edema control Date Initiated: 11/20/2018 Target Resolution Date: 05/07/2019 Goal Status: Active Patient/caregiver will verbalize understanding of disease process and disease management Date Initiated: 11/20/2018 Date Inactivated: 12/17/2018 Target Resolution Date: 12/18/2018 Goal Status: Met Interventions: Assess peripheral edema status every visit. Compression as ordered Provide education on venous insufficiency Treatment Activities: Therapeutic compression applied : 11/20/2018 Notes: Electronic Signature(s) Signed: 04/16/2019 5:20:59 PM By: [name] Entered By: [name] on 04/16/2019 10:34:56 -------------------------------------------------------------------------------- Pain Assessment Details Patient Name: Date of Service: [name], [name] 04/16/2019 9:30 AM Medical Record [id number] Patient Account Number: [id number] Date of Birth/Sex: Treating RN: 06-21-1950 (68 y.o. [name] Primary Care Nanami Whitelaw: PATIENT, NO Other Clinician: Referring Terry Abila: Treating Breniya Goertzen/Extender:Robson, [name], Designated Weeks in Treatment: 21 Active Problems Location of Pain Severity and Description of Pain Patient Has Paino Yes Site Locations Rate the pain. Current Pain Level: 5 Pain Management and Medication Current Pain Management: Electronic Signature(s) Signed: 04/16/2019 5:20:59 PM By: [name] Signed: 05/11/2019 3:00:22 PM By: [name] Entered By: [name] on 04/16/2019 09:44:17 -------------------------------------------------------------------------------- Patient/Caregiver Education Details Patient Name: [name]. Date of Service: 11/13/2020andnbsp9:30 AM Medical Record [id number] Patient Account Number: [id number] Date of Birth/Gender: 1951/04/03 (68 y.o. M) Treating RN: [name] Primary Care Physician: PATIENT, NO Other Clinician: Referring Physician: Treating Physician/Extender:Robson, [name], Designated Weeks in Treatment: 21 Education Assessment Education Provided To: Patient Education Topics Provided Venous: Methods: Explain/Verbal Responses: State content correctly Electronic Signature(s) Signed: 04/16/2019 5:20:59 PM By: [name] Entered By: [name] on 04/16/2019 10:35:11 -------------------------------------------------------------------------------- Wound Assessment Details Patient Name: Date of Service: [name], [name] 04/16/2019 9:30 AM Medical Record [id number] Patient Account Number: [id number] Date of Birth/Sex: Treating RN: Aug 04, 1950 (68 y.o. [name] Primary Care Bao Coreas: PATIENT, NO Other Clinician: Referring Bryan Omura: Treating Hugo Lybrand/Extender:Robson, [name], Designated Weeks in Treatment: 21 Wound Status Wound Number: 4 Primary Venous Leg Ulcer Etiology: Wound Location: Left Malleolus - Lateral Wound  Open Wounding Event: Trauma Status: Date Acquired: 11/02/2018 Comorbid Arrhythmia, Coronary Artery Disease, Weeks Of Treatment: 21 History: Type II Diabetes Clustered Wound: No Photos Wound Measurements Length: (cm) 0.7 % Reduct Width: (cm) 0.7 % Reduct Depth: (cm) 0.3  [place] Area: (cm) 0.385 Tunneli Volume: (cm) 0.115 Undermi Wound Description Full Thickness Without Exposed Support Foul Od Classification: Structures Slough/ Wound Well defined, not attached Margin: Exudate Medium Amount: Exudate Serosanguineous Type: Exudate red, brown Color: Wound Bed Granulation Amount: Medium (34-66%) Granulation Quality: Pink, Pale Fascia Necrotic Amount: Medium (34-66%) Fat Lay Necrotic Quality: Adherent Slough Tendon Muscle Joint E Bone Ex or After Cleansing: No Fibrino Yes Exposed Structure Exposed: No er (Subcutaneous Tissue) Exposed: Yes Exposed: No Exposed: No xposed: No posed: No ion in Area: 70.3% ion in Volume: 11.5% alization: Small (1-33%) ng: No ning: No Electronic Signature(s) Signed: 04/20/2019 4:00:22 PM By: Mikeal Hawthorne EMT/HBOT Signed: 04/22/2019 6:05:31 PM By: [name] Previous Signature: 04/16/2019 5:20:59 PM Version By: [name] Entered By: Mikeal Hawthorne on 04/19/2019 09:01:33 -------------------------------------------------------------------------------- Vitals Details Patient Name: Date of Service: [name] 04/16/2019 9:30 AM Medical Record [id number] Patient Account Number: [id number] Date of Birth/Sex: Treating RN: 1950-07-17 (68 y.o. [name] Primary Care Teva Bronkema: PATIENT, NO Other Clinician: Referring Jakobe Blau: Treating Tabia Landowski/Extender:Robson, [name], Designated Weeks in Treatment: 21 Vital Signs Time Taken: 09:42 Temperature (F): 98.2 Height (in): 73 Pulse (bpm): 88 Weight (lbs): 205 Respiratory Rate (breaths/min): 18 Body Mass Index (BMI): 27 Reference Range: 80 -  120 mg / dl Electronic Signature(s) Signed: 05/11/2019 3:00:22 PM By: [name] Entered By: [name] on 04/16/2019 09:44:04

## 2019-05-12 ENCOUNTER — Encounter (HOSPITAL_BASED_OUTPATIENT_CLINIC_OR_DEPARTMENT_OTHER): Payer: Medicare Other | Admitting: Physician Assistant

## 2019-05-12 NOTE — Progress Notes (Signed)
[name], [name] ([id number]) Visit Report for 05/06/2019 Arrival Information Details Patient Name: Date of Service: [name], [name] 05/06/2019 3:45 PM Medical Record [id number] Patient Account Number: [id number] Date of Birth/Sex: Treating RN: [birth year] (68 y.o. [name] Primary Care Yeraldi Fidler: PATIENT, NO Other Clinician: Referring Cortavious Nix: Treating Katrina Daddona/Extender:Robson, [name], Designated Weeks in Treatment: 23 Visit Information History Since Last Visit Added or deleted any medications: No Patient Arrived: [name] Any new allergies or adverse reactions: No Arrival Time: 16:52 Had a fall or experienced change in No Accompanied By: alone activities of daily living that may affect Transfer Assistance: None risk of falls: Patient Identification Verified: Yes Signs or symptoms of abuse/neglect since last No Secondary Verification Process Yes visito Completed: Hospitalized since last visit: No Patient Requires Transmission- No Implantable device outside of the clinic excluding No Based Precautions: cellular tissue based products placed in the center Patient Has Alerts: Yes ABIs 08/2018 L1.05 since last visit: Patient Alerts: Has Dressing in Place as Prescribed: Yes R0.76 Has Compression in Place as Prescribed: Yes Pain Present Now: No Electronic Signature(s) Signed: 05/12/2019 12:04:56 PM By: [name] Entered By: [name] on 05/06/2019 16:52:37 -------------------------------------------------------------------------------- Compression Therapy Details Patient Name: Date of Service: [name], [name] 05/06/2019 3:45 PM Medical Record [id number] Patient Account Number: [id number] Date of Birth/Sex: Treating RN: 1950/06/22 (68 y.o. [name] Primary Care Angele Wiemann: PATIENT, NO Other Clinician: Referring Detric Scalisi: Treating Odena Mcquaid/Extender:Robson, [name], Designated Weeks in Treatment: 23 Compression  Therapy Performed for Wound Wound #4 Left,Lateral Malleolus Assessment: Performed By: Clinician [name], RN Compression Type: Four Layer Post Procedure Diagnosis Same as Pre-procedure Electronic Signature(s) Signed: 05/06/2019 6:37:14 PM By: [name] Entered By: [name] on 05/06/2019 18:18:15 -------------------------------------------------------------------------------- Lower Extremity Assessment Details Patient Name: Date of Service: [name], [name] 05/06/2019 3:45 PM Medical Record [id number] Patient Account Number: [id number] Date of Birth/Sex: Treating RN: 1950/09/22 (68 y.o. [name] Primary Care Anaelle Dunton: PATIENT, NO Other Clinician: Referring Jebidiah Baggerly: Treating Vonita Calloway/Extender:Robson, [name], Designated Weeks in Treatment: 23 Edema Assessment Assessed: [Left: No] [Right: No] Edema: [Left: No] [Right: No] Calf Left: Right: Point of Measurement: 35 cm From Medial Instep 31.5 cm cm Ankle Left: Right: Point of Measurement: 9 cm From Medial Instep 24 cm cm Vascular Assessment Pulses: Dorsalis Pedis Palpable: [Left:Yes] Electronic Signature(s) Signed: 05/12/2019 12:04:56 PM By: [name] Entered By: [name] on 05/06/2019 17:02:14 -------------------------------------------------------------------------------- Multi Wound Chart Details Patient Name: Date of Service: [name] 05/06/2019 3:45 PM Medical Record [id number] Patient Account Number: [id number] Date of Birth/Sex: Treating RN: Jan 15, 1951 (68 y.o. [name] Primary Care Dailon Sheeran: PATIENT, NO Other Clinician: Referring Lakeisha Waldrop: Treating Emelina Hinch/Extender:Robson, [name], Designated Weeks in Treatment: 23 Vital Signs Height(in): 73 Pulse(bpm): 75 Weight(lbs): 205 Blood Pressure(mmHg): 180/91 Body Mass Index(BMI): 27 Temperature(F): 98.3 Respiratory 18 Rate(breaths/min): Photos: [4:No Photos] [N/A:N/A] Wound  Location: [4:Left Malleolus - Lateral] [N/A:N/A] Wounding Event: [4:Trauma] [N/A:N/A] Primary Etiology: [4:Venous Leg Ulcer] [N/A:N/A] Comorbid History: [4:Arrhythmia, Coronary Artery N/A Disease, Type II Diabetes] Date Acquired: [4:11/02/2018] [N/A:N/A] Weeks of Treatment: [4:23] [N/A:N/A] Wound Status: [4:Open] [N/A:N/A] Measurements L x W x D 0.8x0.8x0.6 [N/A:N/A] (cm) Area (cm) : [4:0.503] [N/A:N/A] Volume (cm) : [4:0.302] [N/A:N/A] % Reduction in Area: [4:61.20%] [N/A:N/A] % Reduction in Volume: -132.30% [N/A:N/A] Classification: [4:Full Thickness Without Exposed Support Structures] [N/A:N/A] Exudate Amount: [4:Medium] [N/A:N/A] Exudate Type: [4:Serous] [N/A:N/A] Exudate Color: [4:amber] [N/A:N/A] Wound Margin: [4:Well defined, not attached N/A] Granulation Amount: [4:Small (1-33%)] [N/A:N/A] Granulation Quality: [4:Pink, Pale] [N/A:N/A] Necrotic Amount: [4:Large (67-100%)] [N/A:N/A] Exposed Structures: [  4:Fat Layer (Subcutaneous N/A Tissue) Exposed: Yes Fascia: No Tendon: No Muscle: No Joint: No Bone: No] Epithelialization: [4:None] [N/A:N/A] Debridement: [4:Debridement - Excisional N/A] Pre-procedure [4:17:25] [N/A:N/A] Verification/Time Out Taken: Pain Control: [4:Lidocaine 4% Topical Solution] [N/A:N/A] Tissue Debrided: [4:Subcutaneous, Slough] [N/A:N/A] Level: [4:Skin/Subcutaneous Tissue N/A] Debridement Area (sq cm):0.64 [N/A:N/A] Instrument: [4:Curette] [N/A:N/A] Bleeding: [4:None] [N/A:N/A] Hemostasis Achieved: [4:Pressure] [N/A:N/A] Procedural Pain: [4:0] [N/A:N/A] Post Procedural Pain: [4:0] [N/A:N/A] Debridement Treatment Procedure was tolerated [N/A:N/A] Response: [4:well] Post Debridement [4:0.8x0.8x0.6] [N/A:N/A] Measurements L x W x D (cm) Post Debridement [4:0.302] [N/A:N/A] Volume: (cm) Procedures Performed: [4:Cellular or Tissue Based Product Debridement] [N/A:N/A] Treatment Notes Electronic Signature(s) Signed: 05/06/2019 6:37:14 PM By: [name] Signed: 05/07/2019 7:57:15 AM By: Linton Ham MD Entered By: Linton Ham on 05/06/2019 18:01:53 -------------------------------------------------------------------------------- Pain Assessment Details Patient Name: Date of Service: [name], [name] 05/06/2019 3:45 PM Medical Record [id number] Patient Account Number: [id number] Date of Birth/Sex: Treating RN: [birth year] (68 y.o. [name] Primary Care Maijor Hornig: PATIENT, NO Other Clinician: Referring Jaquane Boughner: Treating Rubye Strohmeyer/Extender:Robson, [name], Designated Weeks in Treatment: 23 Active Problems Location of Pain Severity and Description of Pain Patient Has Paino No Site Locations Pain Management and Medication Current Pain Management: Electronic Signature(s) Signed: 05/12/2019 12:04:56 PM By: [name] Entered By: [name] on 05/06/2019 17:02:09 -------------------------------------------------------------------------------- Wound Assessment Details Patient Name: Date of Service: [name], [name] 05/06/2019 3:45 PM Medical Record [id number] Patient Account Number: [id number] Date of Birth/Sex: Treating RN: 15-Jan-1951 (68 y.o. [name] Primary Care Amellia Panik: PATIENT, NO Other Clinician: Referring Jamall Strohmeier: Treating Kelven Flater/Extender:Robson, [name], Designated Weeks in Treatment: 23 Wound Status Wound Number: 4 Primary Venous Leg Ulcer Etiology: Wound Location: Left Malleolus - Lateral Wound Open Wounding Event: Trauma Status: Date Acquired: 11/02/2018 Comorbid Arrhythmia, Coronary Artery Disease, Weeks Of Treatment: 23 History: Type II Diabetes Clustered Wound: No Photos Wound Measurements Length: (cm) 0.8 % Reduct Width: (cm) 0.8 % Reduct Depth: (cm) 0.6 Epitheli Area: (cm) 0.503 Tunneli Volume: (cm) 0.302 Undermi Wound Description Full Thickness Without Exposed Support Foul Odo Classification: Structures Slough/F Wound Well  defined, not attached Margin: Exudate Medium Amount: Exudate Serous Type: Exudate amber Color: Wound Bed Granulation Amount: Small (1-33%) Granulation Quality: Pink, Pale Fascia E Necrotic Amount: Large (67-100%) Fat Laye Necrotic Quality: Adherent Slough Tendon E Muscle E Joint Exp Bone Expo r After Cleansing: No ibrino Yes Exposed Structure xposed: No r (Subcutaneous Tissue) Exposed: Yes xposed: No xposed: No osed: No sed: No ion in Area: 61.2% ion in Volume: -132.3% alization: None ng: No ning: No Electronic Signature(s) Signed: 05/10/2019 5:20:01 PM By: [name] Signed: 05/11/2019 4:34:42 PM By: Mikeal Hawthorne EMT/HBOT Previous Signature: 05/06/2019 6:37:14 PM Version By: [name] Entered By: Mikeal Hawthorne on 05/10/2019 11:30:15 -------------------------------------------------------------------------------- Vitals Details Patient Name: Date of Service: [name] 05/06/2019 3:45 PM Medical Record [id number] Patient Account Number: [id number] Date of Birth/Sex: Treating RN: [birth year] (68 y.o. [name] Primary Care Rajinder Mesick: PATIENT, NO Other Clinician: Referring Idil Maslanka: Treating Sheccid Lahmann/Extender:Robson, [name], Designated Weeks in Treatment: 23 Vital Signs Time Taken: 16:50 Temperature (F): 98.3 Height (in): 73 Pulse (bpm): 75 Weight (lbs): 205 Respiratory Rate (breaths/min): 18 Body Mass Index (BMI): 27 Blood Pressure (mmHg): 180/91 Reference Range: 80 - 120 mg / dl Electronic Signature(s) Signed: 05/12/2019 12:04:56 PM By: [name] Entered By: [name] on 05/06/2019 17:02:03

## 2019-05-14 ENCOUNTER — Encounter (HOSPITAL_BASED_OUTPATIENT_CLINIC_OR_DEPARTMENT_OTHER): Payer: Medicare Other | Admitting: Internal Medicine

## 2019-05-14 ENCOUNTER — Other Ambulatory Visit: Payer: Self-pay

## 2019-05-14 DIAGNOSIS — E11621 Type 2 diabetes mellitus with foot ulcer: Secondary | ICD-10-CM | POA: Diagnosis not present

## 2019-05-17 NOTE — Progress Notes (Addendum)
PRATEEK, KNIPPLE ([id number]) Visit Report for 05/14/2019 Arrival Information Details Patient Name: Date of Service: [name], [name] 05/14/2019 3:00 PM Medical Record [id number] Patient Account Number: [id number] Date of Birth/Sex: Treating RN: Nov 14, 1950 (68 y.o. Male) Carlene Coria Primary Care Ruthetta Koopmann: PATIENT, NO Other Clinician: Referring Shamika Pedregon: Treating Brynnleigh Mcelwee/Extender:Robson, [name], Designated Weeks in Treatment: 25 Visit Information History Since Last Visit All ordered tests and consults were completed: No Patient Arrived: Ambulatory Added or deleted any medications: No Arrival Time: 16:23 Any new allergies or adverse reactions: No Accompanied By: self Had a fall or experienced change in No Transfer Assistance: None activities of daily living that may affect Patient Identification Verified: Yes risk of falls: Secondary Verification Process Yes Signs or symptoms of abuse/neglect since last No Completed: visito Patient Requires Transmission- No Hospitalized since last visit: No Based Precautions: Implantable device outside of the clinic excluding No Patient Has Alerts: Yes ABIs 08/2018 L1.05 cellular tissue based products placed in the center Patient Alerts: since last visit: R0.76 Has Dressing in Place as Prescribed: Yes Has Compression in Place as Prescribed: Yes Pain Present Now: No Electronic Signature(s) Signed: 05/14/2019 5:42:46 PM By: Carlene Coria RN Entered By: Carlene Coria on 05/14/2019 16:24:47 -------------------------------------------------------------------------------- Compression Therapy Details Patient Name: Date of Service: [name], [name] 05/14/2019 3:00 PM Medical Record [id number] Patient Account Number: [id number] Date of Birth/Sex: [birth year] (68 y.o. Male) Treating RN: [name] Primary Care Michall Noffke: PATIENT, NO Other Clinician: Referring Lexie Morini: Treating Jonni Oelkers/Extender:Robson,  [name], Designated Weeks in Treatment: 25 Compression Therapy Performed for Wound Wound #4 Left,Lateral Malleolus Assessment: Performed By: Clinician [name], RN Compression Type: Four Layer Post Procedure Diagnosis Same as Pre-procedure Electronic Signature(s) Signed: 05/17/2019 5:56:42 PM By: [name] Entered By: [name] on 05/14/2019 17:12:57 -------------------------------------------------------------------------------- Encounter Discharge Information Details Patient Name: Date of Service: Michael Post F. 05/14/2019 3:00 PM Medical Record [id number] Patient Account Number: [id number] Date of Birth/Sex: Treating RN: 1951-02-05 (68 y.o. Male) [name] Primary Care Kathaleya Mcduffee: PATIENT, NO Other Clinician: Referring Shawntay Prest: Treating Algie Westry/Extender:Robson, [name], Designated Weeks in Treatment: 25 Encounter Discharge Information Items Post Procedure Vitals Discharge Condition: Stable Temperature (F): 98.9 Ambulatory Status: Ambulatory Pulse (bpm): 81 Discharge Destination: Home Respiratory Rate (breaths/min): 18 Transportation: Private Auto Blood Pressure (mmHg): 215/118 Accompanied By: self Schedule Follow-up Appointment: Yes Clinical Summary of Care: Electronic Signature(s) Signed: 05/14/2019 6:25:32 PM By: [name] Entered By: [name] on 05/14/2019 17:39:16 -------------------------------------------------------------------------------- Lower Extremity Assessment Details Patient Name: Date of Service: [name], [name] 05/14/2019 3:00 PM Medical Record [id number] Patient Account Number: [id number] Date of Birth/Sex: Treating RN: 03-23-1951 (68 y.o. Male) Carlene Coria Primary Care Arlis Everly: PATIENT, NO Other Clinician: Referring Khailee Mick: Treating Ubah Radke/Extender:Robson, [name], Designated Weeks in Treatment: 25 Edema Assessment Assessed: [Left: No] [Right: No] Edema: [Left: No]  [Right: No] Calf Left: Right: Point of Measurement: 35 cm From Medial Instep 33 cm cm Ankle Left: Right: Point of Measurement: 9 cm From Medial Instep 23 cm cm Electronic Signature(s) Signed: 05/14/2019 5:42:46 PM By: Carlene Coria RN Entered By: Carlene Coria on 05/14/2019 16:37:10 -------------------------------------------------------------------------------- Multi Wound Chart Details Patient Name: Date of Service: [name]. 05/14/2019 3:00 PM Medical Record [id number] Patient Account Number: [id number] Date of Birth/Sex: Treating RN: January 24, 1951 (68 y.o. Male) Primary Care Kalisha Keadle: PATIENT, NO Other Clinician: Referring Palmer Shorey: Treating Brizza Nathanson/Extender:Robson, [name], Designated Weeks in Treatment: 25 Vital Signs Height(in): 73 Pulse(bpm): 81 Weight(lbs): 205 Blood Pressure(mmHg): 215/118 Body Mass Index(BMI): 27 Temperature(F): 98.9 Respiratory 18 Rate(breaths/min): Photos: [4:No Photos] [N/A:N/A] Wound Location: [  4:Left Malleolus - Lateral] [N/A:N/A] Wounding Event: [4:Trauma] [N/A:N/A] Primary Etiology: [4:Venous Leg Ulcer] [N/A:N/A] Comorbid History: [4:Arrhythmia, Coronary Artery N/A Disease, Type II Diabetes] Date Acquired: [4:11/02/2018] [N/A:N/A] Weeks of Treatment: [4:25] [N/A:N/A] Wound Status: [4:Open] [N/A:N/A] Measurements L x W x D 0.9x0.8x0.4 [N/A:N/A] (cm) Area (cm) : [4:0.565] [N/A:N/A] Volume (cm) : [4:0.226] [N/A:N/A] % Reduction in Area: [4:56.40%] [N/A:N/A] % Reduction in Volume: -73.80% [N/A:N/A] Classification: [4:Full Thickness Without Exposed Support Structures] [N/A:N/A] Exudate Amount: [4:Medium] [N/A:N/A] Exudate Type: [4:Serosanguineous] [N/A:N/A] Exudate Color: [4:red, brown] [N/A:N/A] Wound Margin: [4:Well defined, not attached N/A] Granulation Amount: [4:Small (1-33%)] [N/A:N/A] Granulation Quality: [4:Pink, Pale] [N/A:N/A] Necrotic Amount: [4:Large (67-100%)] [N/A:N/A] Exposed Structures: [4:Fat  Layer (Subcutaneous Tissue) Exposed: Yes Fascia: No Tendon: No Muscle: No Joint: No Bone: No] [N/A:N/A] Epithelialization: [4:None] [N/A:N/A] Procedures Performed: [4:Cellular or Tissue Based Product Compression Therapy] [N/A:N/A] Treatment Notes Wound #4 (Left, Lateral Malleolus) 1. Cleanse With Wound Cleanser Soap and water 2. Periwound Care TCA Cream 3. Primary Dressing Applied Cellular Based Tissue Product 4. Secondary Dressing Dry Gauze Foam Drawtex 6. Support Layer Applied 4 layer compression wrap Notes oasis burn applied by MD and covered with adaptic and secured with steristrips. no kerlix and no cotton. Electronic Signature(s) Signed: 05/14/2019 6:34:01 PM By: Linton Ham MD Entered By: Linton Ham on 05/14/2019 17:53:31 -------------------------------------------------------------------------------- Multi-Disciplinary Care Plan Details Patient Name: Date of Service: [name], [name] 05/14/2019 3:00 PM Medical Record [id number] Patient Account Number: [id number] Date of Birth/Sex: Treating RN: 07-Sep-1950 (68 y.o. Male) [name] Primary Care Lanore Renderos: PATIENT, NO Other Clinician: Referring Liberato Stansbery: Treating Deaisa Merida/Extender:Robson, [name], Designated Weeks in Treatment: 25 Active Inactive Venous Leg Ulcer Nursing Diagnoses: Knowledge deficit related to disease process and management Potential for venous Insuffiency (use before diagnosis confirmed) Goals: Patient will maintain optimal edema control Date Initiated: 11/20/2018 Target Resolution Date: 06/11/2019 Goal Status: Active Patient/caregiver will verbalize understanding of disease process and disease management Date Initiated: 11/20/2018 Date Inactivated: 12/17/2018 Target Resolution Date: 12/18/2018 Goal Status: Met Interventions: Assess peripheral edema status every visit. Compression as ordered Provide education on venous insufficiency Treatment Activities: Therapeutic  compression applied : 11/20/2018 Notes: Electronic Signature(s) Signed: 05/17/2019 5:56:42 PM By: [name] Entered By: [name] on 05/14/2019 16:46:01 -------------------------------------------------------------------------------- Pain Assessment Details Patient Name: Date of Service: [name], [name] 05/14/2019 3:00 PM Medical Record [id number] Patient Account Number: [id number] Date of Birth/Sex: Treating RN: Apr 04, 1951 (68 y.o. Male) Carlene Coria Primary Care Dellanira Dillow: PATIENT, NO Other Clinician: Referring Rakan Soffer: Treating Teondre Jarosz/Extender:Robson, [name], Designated Weeks in Treatment: 25 Active Problems Location of Pain Severity and Description of Pain Patient Has Paino Yes Site Locations With Dressing Change: Yes With Dressing Change: Yes Duration of the Pain. Constant / Intermittento Constant Rate the pain. Current Pain Level: 4 Worst Pain Level: 4 Least Pain Level: 2 Tolerable Pain Level: 5 Character of Pain Describe the Pain: Aching, Burning Pain Management and Medication Current Pain Management: Medication: Yes Cold Application: No Rest: Yes Massage: No Activity: No T.E.N.S.: No Heat Application: No Leg drop or elevation: No Is the Current Pain Management Adequate: Inadequate How does your wound impact your activities of daily livingo Sleep: Yes Bathing: No Appetite: No Relationship With Others: No Bladder Continence: No Emotions: No Bowel Continence: No Work: No Toileting: No Drive: No Dressing: No Hobbies: No Electronic Signature(s) Signed: 05/14/2019 5:42:46 PM By: Carlene Coria RN Entered By: Carlene Coria on 05/14/2019 16:27:34 -------------------------------------------------------------------------------- Patient/Caregiver Education Details Patient Name: [name] 12/11/2020andnbsp3:00 Date of Service: PM Medical Record [id number] Number: Patient Account Number:  [id number] Treating RN: Date  of Birth/Gender: [birth year] (68 y.o. [name] Male) Other Clinician: Primary Care Physician:PATIENT, NO Treating Linton Ham Referring Physician: Physician/Extender: None, Designated Weeks in Treatment: 25 Education Assessment Education Provided To: Patient Education Topics Provided Venous: Methods: Explain/Verbal Responses: State content correctly Electronic Signature(s) Signed: 05/17/2019 5:56:42 PM By: [name] Entered By: [name] on 05/14/2019 16:46:18 -------------------------------------------------------------------------------- Wound Assessment Details Patient Name: Date of Service: [name], [name] 05/14/2019 3:00 PM Medical Record [id number] Patient Account Number: [id number] Date of Birth/Sex: Treating RN: 1951-04-10 (68 y.o. Male) Carlene Coria Primary Care Tadeusz Stahl: PATIENT, NO Other Clinician: Referring Zoejane Gaulin: Treating Tymar Polyak/Extender:Robson, [name], Designated Weeks in Treatment: 25 Wound Status Wound Number: 4 Primary Venous Leg Ulcer Etiology: Wound Location: Left Malleolus - Lateral Wound Open Wounding Event: Trauma Status: Date Acquired: 11/02/2018 Comorbid Arrhythmia, Coronary Artery Disease, Weeks Of Treatment: 25 History: Type II Diabetes Clustered Wound: No Photos Wound Measurements Length: (cm) 0.9 % Reduct Width: (cm) 0.8 % Reduct Depth: (cm) 0.4 Epitheli Area: (cm) 0.565 Tunneli Volume: (cm) 0.226 Undermi Wound Description Full Thickness Without Exposed Support Foul Od Classification: Structures Slough/ Wound Well defined, not attached Margin: Exudate Medium Medium Amount: Exudate Serosanguineous Type: Exudate red, brown Color: Wound Bed Granulation Amount: Small (1-33%) Granulation Quality: Pink, Pale Fascia Necrotic Amount: Large (67-100%) Fat La Necrotic Quality: Adherent Slough Tendon Muscle Joint Bone E or After Cleansing: No Fibrino Yes Exposed Structure Exposed:  No yer (Subcutaneous Tissue) Exposed: Yes Exposed: No Exposed: No Exposed: No xposed: No ion in Area: 56.4% ion in Volume: -73.8% alization: None ng: No ning: No Treatment Notes Wound #4 (Left, Lateral Malleolus) 1. Cleanse With Wound Cleanser Soap and water 2. Periwound Care TCA Cream 3. Primary Dressing Applied Cellular Based Tissue Product 4. Secondary Dressing Dry Gauze Foam Drawtex 6. Support Layer Applied 4 layer compression wrap Notes oasis burn applied by MD and covered with adaptic and secured with steristrips. no kerlix and no cotton. Electronic Signature(s) Signed: 05/18/2019 3:57:04 PM By: [name] EMT/HBOT Signed: 05/19/2019 9:49:12 AM By: Carlene Coria RN Previous Signature: 05/14/2019 5:42:46 PM Version By: Carlene Coria RN Entered By: [name] on 05/18/2019 14:35:24 -------------------------------------------------------------------------------- Vitals Details Patient Name: Date of Service: Michael Post F. 05/14/2019 3:00 PM Medical Record [id number] Patient Account Number: [id number] Date of Birth/Sex: Treating RN: 1950-12-12 (68 y.o. Male) Epps, Morey Hummingbird Primary Care Kaely Hollan: PATIENT, NO Other Clinician: Referring Huy Majid: Treating Alma Mohiuddin/Extender:Robson, [name], Designated Weeks in Treatment: 25 Vital Signs Time Taken: 16:24 Temperature (F): 98.9 Height (in): 73 Pulse (bpm): 81 Weight (lbs): 205 Respiratory Rate (breaths/min): 18 Body Mass Index (BMI): 27 Blood Pressure (mmHg): 215/118 Reference Range: 80 - 120 mg / dl Notes patient reports he ahs not taken meds times 2 days, patient denies lightheadedness/ dizziness, blurred vision, niose bleed, ringing of ears, oriented times 3 , notified Dr Dellia Nims Electronic Signature(s) Signed: 05/14/2019 5:42:46 PM By: Carlene Coria RN Entered By: Carlene Coria on 05/14/2019 16:26:54

## 2019-05-17 NOTE — Progress Notes (Signed)
[name], [name] ([id number]) Visit Report for 05/14/2019 Cellular or Tissue Based Product Details Patient Name: Date of Service: [name], [name] 05/14/2019 3:00 PM Medical Record [id number] Patient Account Number: [id number] Date of Birth/Sex: [birth year] (68 y.o. Male) Treating RN: Primary Care Provider: PATIENT, NO Other Clinician: Referring Provider: Treating Provider/Extender:[name], [name], Designated Weeks in Treatment: 25 Cellular or Tissue Based Wound #4 Left,Lateral Malleolus Product Type Applied to: Performed By: Physician [name]., MD Cellular or Tissue Based Oasis wound matrix Product Type: Level of Consciousness (Pre- Awake and Alert procedure): Pre-procedure Yes - 16:00 Verification/Time Out Taken: Location: trunk / arms / legs Wound Size (sq cm): 0.72 Product Size (sq cm): 10 Waste Size (sq cm): 7 Waste Reason: wound size Amount of Product Applied (sq cm): 3 Instrument Used: Forceps, Scissors Lot #: [id number] Order #: 6 Expiration Date: 06/05/2020 Fenestrated: No Reconstituted: Yes Solution Type: normal saline Solution Amount: 15mL Lot #: [id number] Solution Expiration Date: 09/30/2020 Secured: Yes Secured With: Steri-Strips Dressing Applied: Yes Primary Dressing: adaptic Procedural Pain: 0 Post Procedural Pain: 0 Response to Treatment: Procedure was tolerated well Level of Consciousness Awake and Alert (Post-procedure): Post Procedure Diagnosis Same as Pre-procedure Electronic Signature(s) Signed: 05/14/2019 6:34:01 PM By: Michael Ham MD Entered By: [name] on 05/14/2019 17:53:41 -------------------------------------------------------------------------------- HPI Details Patient Name: Date of Service: [name]. 05/14/2019 3:00 PM Medical Record [id number] Patient Account Number: [id number] Date of Birth/Sex: Treating RN: 01-02-1951 (68 y.o. Male) Primary Care Provider: PATIENT, NO Other  Clinician: Referring Provider: Treating Provider/Extender:[name], [name], Designated Weeks in Treatment: 25 History of Present Illness HPI Description: 08/22/17-He is here for initial evaluation of a right heel wound, right toe wound and left heel fissures. He states that he has chronic dry, flaking skin and frequently has cracked heels. He states the right heel wound developed as such but he developed the wound approximately 3 weeks ago when pulling some skin off; he was unaware of the right toe ulcer. He was treating the right heel with over-the-counter triple antibiotic ointment and peroxide. He presented to urgent care on 3/12 with a 2 x 2 centimeter ulceration to the right heel and was discharged with a 10 day prescription for Bactrim. He does have a history of neuropathy, diet controlled diabetic. His does not know his most recent A1c but will be seeing his PCP next week, there is no evidence of an A1c in Epic. He denies smoking, does have a history of alcohol use, he admits to 5+/- beers weekly not daily. He did follow-up with cardiology on 3/18 with c/o ble edema, they ordered DVT study which he completed on 3/21. He reports that the bilateral DVT study was negative per the ultrasound technician, no report is available at this time. READMISSION 11/20/2018 this is a now 68 year old man who is here for review of wounds on his lower extremity on the left. We note that he was here on a single visit in March he tells [place] that over the last 2019. His major issue is that he dropped a box and hit the outside of his left lateral malleolus about 3 weeks ago. This is left him with a nonhealing area. He has been using Iodosorb ointment he had apparently from the last stay in this clinic. Also of note over the last 6 weeks he has noted an area that is raised and will scab over but then he traumatizes a scab and then this bleeds and reopens. This is on the left posterior calf. All  of this appears  to be on the background of chronic venous insufficiency. He has had DVT studies in the past that were negative in 2019. He is also had arterial studies that showed an ABI on the right of 0.76 and on the left of 1.05. Past medical history; the patient states that he is not a diabetic although I note there is some suggested he was in the note from last year. He has a history of coronary artery disease. He tells me that he has had a history of skin cancer but he is not sure which one but that includes one on the left upper thigh. ABIs done previously 0.76 on the right and 1.05 on the left 6/26; patient readmitted to the clinic last week. He had a traumatic area over the left lateral malleolus as well as a hyper granulated growth on the left posterior calf. He also has severe chronic venous insufficiency. 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 We have been using Iodoflex to the traumatic wound under compression. 7/9; area over the left lateral malleolus continues to require debridement and we are continuing with Iodoflex under compression. He has severe chronic venous insufficiency. 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 This patient has severe bilateral venous insufficiency and venous hypertension with skin changes resulting. He requires bilateral reflux studies Finally he traumatized his right lateral calf while doing yard work about a week ago. The area is small with a flap of skin over the majority of the wound but I am not sure this is going to remain viable 7/16; still not a viable surface on the left lateral malleolus in fact this wound is deeper. We have been using Iodoflex. 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 He has severe bilateral venous insufficiency and I have ordered reflux studies these apparently are booked for next week. Our intake nurse noted some yellowish-green drainage coming from the wound on the left ankle. Patient was insistent on talking about doing carotid ultrasounds and he was hoping to get these done with his reflux studies next week. I spent a few minutes talking to him about this I just could not put together in a logical way what he is concerned about. At the end of this I asked him to see his primary doctor about this he says he does not have a primary doctor advised him to get a primary doctor to go over this with him. It was not really clear where the level of concern was specific to carotid ultrasound 7/23; a better looking surface on the left lateral malleolus although it still requires debridement. He tells me he has an appointment to [facility] surgery center next week. Small area on the right lateral calf. The patient went to [facility] I do not think they took his compression wraps off. He did have reflux on the left in the common femoral greater saphenous and saphenofemoral junction and the great saphenous vein in the proximal thigh. 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 Looking at the numbers it would appear that the maximal reflux was in the great saphenous vein in the proximal thigh. As usual I am not sure what would be possible to do here. I will send him to vascular surgery 7/30; the patient still requires debridement in the left lateral malleolus although in general the wound is still deep and and punched out. We are using Sorbact. The area on the right lateral calf is improved. 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 The patient does not have a vascular surgery  appointment nor does he have a skin surgery center appointment. I am really not sure what the issues are here although I know vascular surgery as well behind. 8/6-Patient does not have a vascular appointment nor does he have a skin surgery appointment firmed up at this time although he says he is called them both, impressed upon him that he should see whoever is available for the vascular so the vein studies can be interpreted. 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 He is here for the left lateral malleoli wound for which we are using SOrbact, and he is disappointed the wound is not healing 8/21; patient saw Dr. Doren Custard on 01/21/2019. Dr. Doren Custard was concerned about his arterial flow. Noting that he had trouble feeling the dorsalis pedis and posterior tibial arteries also noteworthy that the Doppler had a barely biphasic posterior tibial signal with a monophasic but fairly brisk dorsalis pedis signal. He is booked for an angiogram next Friday. We have been using 3 layer compression on him. 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 Given Dr. Mee Hives concern it was difficult for me to be comfortable with him going ahead with the cancer extraction and I have suggested canceling this. Finally he has an increase in the erythematous contact dermatitis in the entire wrapped area of the left leg which is probably an allergy to the contact layer of the 3 layer. 8/27; he is going for his angiogram tomorrow by Dr. Doren Custard.  We cancel the Mohs surgery on the tumor on his posterior calf pending the angiogram tomorrow. If this shows adequate blood flow to this area I think we can rebook this. His main wound is on the left lateral malleolus a small refractory punched-out area. We have been using Hydrofera Blue 9/3; his angiogram was done as scheduled. He does indeed have PAD. The common femoral, deep femoral superficial femoral and popliteal arteries will are all widely patent. 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 The overall thought was that he had enough adequate circulation to heal his venous ulcer and continue to use mild elevation and compression. Noted that he had biphasic posterior tibial signals with a Doppler ABI of 0.91. It was not felt that the  stenosis in the proximal tibial artery would be associated with increased blood flow he will follow-up with Dr. Doren Custard in 1 month He now needs to get the skin surgery appointment set up. The area is growing larger and bleeding frequently on the posterior left calf per the patient 02/11/19-Patient is back after 1 week, results of his vascular studies are noted, patient needs to have a skin surgery appointment established apparently he did not return the calls this week. The area on the posterior calf is where the skin surgery needs to be done, the left lateral malleoli are wound appears to be about the same we will using Hydrofera Blue change 9/24; patient finally has a booking to deal with the cancer on his posterior calf I believe with the skin surgery center. Not much change in the wound over the left lateral malleolus. It is been 2 weeks since he was here. I changed him to endoform last time 10/2; the patient has been to see dermatology. Scheduled for surgery on 10/14.  Using endoform to the wound over the lateral malleolus some improvement in the wound condition but not the surface area. He has severe chronic venous insufficiency. He also has some arterial disease he was angiogram by Dr. Doren Custard. He did not think that anything needed to be done from an intervention point of view. 10/8; his surgery is still scheduled for 10/14. It is concerning about whether he is healed or maintained skin integrity and is sutured area given the severity of his chronic venous insufficiency. The area we are following is on the left lateral malleolus. Some improvement in the surface area. No debridement today we have been using endoform 10/15; the patient finally had his surgery for the tumor on his left posterior calf. We did not look at this today. Still a punched out area in the lower left malleolus. Perhaps some mild improvement we have been using endoform We were denied for Grafix and still have not heard back on Oasis 10/22; no real change in this wound. I am able to debride it to a healthy looking surface but the next week there is recurrent debris and no improvement in depth. Punched out over the left lateral malleolus. We have not heard back on Oasis. We were denied for Grafix 11/6; wound is perhaps slightly smaller. Still punched out. He was approved for Oasis we applied Oasis #1. His surgical wound on the back of the left calf was reviewed by dermatology we are not reviewing this 11/13; I think the wound is filled in somewhat. Certainly a much more vibrant looking surface. Oasis #2. I looked at his surgical site on the back of the calf today for the first time. Clean incision small open areas 11/19; measurements not much different today. Still 0.4 cm in depth although post debridement the wound surface looks better. Oasis #3 the site on the back of his calf looks satisfactory. I put him in 4 layer compression last week he seems to tolerate this. He is complaining of some  tenderness around the wound. This was controlled swelling around the wound. He seems to have tolerated this 04/28/2019 on evaluation today patient appears to be doing slightly better based on measurements compared to last week. He is here for Oasis application #4 to the ankle region. Fortunately there is no signs of other wounds open at this point and no signs of infection. 12/3; Oasis #5. We have a better looking wound surface but not much change in depth at 0.6  cm. 12/11; Oasis #6 again some improvement this week better looking surface. Perhaps some improvement in depth Electronic Signature(s) Signed: 05/14/2019 6:34:01 PM By: Michael Ham MD Entered By: [name] on 05/14/2019 17:55:28 -------------------------------------------------------------------------------- Physical Exam Details Patient Name: Date of Service: [name], [name] 05/14/2019 3:00 PM Medical Record [id number] Patient Account Number: [id number] Date of Birth/Sex: Treating RN: 10-Jun-1950 (68 y.o. Male) Primary Care Provider: PATIENT, NO Other Clinician: Referring Provider: Treating Provider/Extender:Markelle Asaro, [name], Designated Weeks in Treatment: 25 Constitutional Blood pressure extremely high unfortunately this was not recognized in the clinic. Pulse regular and within target range for patient.Marland Kitchen Respirations regular, non-labored and within target range.. Temperature is normal and within the target range for the patient.Marland Kitchen Appears in no distress. Eyes Conjunctivae clear. No discharge.no icterus. Respiratory work of breathing is normal. Cardiovascular Dorsalis pedis pulses always been strong in the left foot. Notes Wound exam; no debridement. Surface of the wound looks better. It appears to have contracted. The granulation at the base of the wound looks reasonably healthy. Electronic Signature(s) Signed: 05/14/2019 6:34:01 PM By: Michael Ham MD Entered By: [name] on 05/14/2019  17:58:32 -------------------------------------------------------------------------------- Physician Orders Details Patient Name: Date of Service: [name], [name] 05/14/2019 3:00 PM Medical Record [id number] Patient Account Number: [id number] Date of Birth/Sex: Treating RN: [birth year] (68 y.o. Male) [name] Primary Care Provider: PATIENT, NO Other Clinician: Referring Provider: Treating Provider/Extender:Alera Quevedo, [name], Designated Weeks in Treatment: 25 Verbal / Phone Orders: No Diagnosis Coding ICD-10 Coding Code Description I87.332 Chronic venous hypertension (idiopathic) with ulcer and inflammation of left lower extremity L97.321 Non-pressure chronic ulcer of left ankle limited to breakdown of skin C44.799 Other specified malignant neoplasm of skin of left lower limb, including hip Follow-up Appointments Return Appointment in 1 week. Dressing Change Frequency Wound #4 Left,Lateral Malleolus Do not change entire dressing for one week. Skin Barriers/Peri-Wound Care TCA Cream or Ointment - liberal to lower leg Wound Cleansing Wound #4 Left,Lateral Malleolus May shower with protection. Primary Wound Dressing Wound #4 Left,Lateral Malleolus Skin Substitute Application - Oasis Burn Matrix #6. secured with adaptic and steri-strips. leave in place. do not remove. Secondary Dressing Wound #4 Left,Lateral Malleolus Foam - foam donut. Dry Gauze Drawtex Other: - pad the left lateral and anterior portion of lower leg. Edema Control 4 layer compression: Left lower extremity - no kerlix no cotton. pad with foam to anterior portion of lower leg. Avoid standing for long periods of time Elevate legs to the level of the heart or above for 30 minutes daily and/or when sitting, a frequency of: - throughout the day Exercise regularly Electronic Signature(s) Signed: 05/14/2019 6:34:01 PM By: Michael Ham MD Signed: 05/17/2019 5:56:42 PM By: [name] Entered By: [name] on 05/14/2019 17:13:24 -------------------------------------------------------------------------------- Problem List Details Patient Name: Date of Service: Michael Post F. 05/14/2019 3:00 PM Medical Record [id number] Patient Account Number: [id number] Date of Birth/Sex: Treating RN: 1951/03/04 (68 y.o. Male) [name] Primary Care Provider: PATIENT, NO Other Clinician: Referring Provider: Treating Provider/Extender:Amani Nodarse, [name], Designated Weeks in Treatment: 25 Active Problems ICD-10 Evaluated Encounter Code Description Active Date Today Diagnosis I87.332 Chronic venous hypertension (idiopathic) with ulcer 11/20/2018 No Yes and inflammation of left lower extremity L97.321 Non-pressure chronic ulcer of left ankle limited to 11/20/2018 No Yes breakdown of skin C44.799 Other specified malignant neoplasm of skin of left 11/27/2018 No Yes lower limb, including hip Inactive Problems ICD-10 Code Description Active Date Inactive Date L97.211 Non-pressure chronic ulcer of right calf limited to breakdown of 12/10/2018 12/10/2018  skin Resolved Problems Electronic Signature(s) Signed: 05/14/2019 6:34:01 PM By: Michael Ham MD Entered By: [name] on 05/14/2019 17:53:24 -------------------------------------------------------------------------------- Progress Note Details Patient Name: Date of Service: [name]. 05/14/2019 3:00 PM Medical Record [id number] Patient Account Number: [id number] Date of Birth/Sex: Treating RN: March 19, 1951 (68 y.o. Male) Primary Care Provider: PATIENT, NO Other Clinician: Referring Provider: Treating Provider/Extender:Kaycie Pegues, [name], Designated Weeks in Treatment: 25 Subjective History of Present Illness (HPI) 08/22/17-He is here for initial evaluation of a right heel wound, right toe wound and left heel fissures. He states that he has chronic dry, flaking skin and  frequently has cracked heels. He states the right heel wound developed as such but he developed the wound approximately 3 weeks ago when pulling some skin off; he was unaware of the right toe ulcer. He was treating the right heel with over-the-counter triple antibiotic ointment and peroxide. He presented to urgent care on 3/12 with a 2 x 2 centimeter ulceration to the right heel and was discharged with a 10 day prescription for Bactrim. He does have a history of neuropathy, diet controlled diabetic. His does not know his most recent A1c but will be seeing his PCP next week, there is no evidence of an A1c in Epic. He denies smoking, does have a history of alcohol use, he admits to 5+/- beers weekly not daily. He did follow-up with cardiology on 3/18 with c/o ble edema, they ordered DVT study which he completed on 3/21. He reports that the bilateral DVT study was negative per the ultrasound technician, no report is available at this time. READMISSION 11/20/2018 this is a now 68 year old man who is here for review of wounds on his lower extremity on the left. We note that he was here on a single visit in March he tells [place] that over the last 2019. His major issue is that he dropped a box and hit the outside of his left lateral malleolus about 3 weeks ago. This is left him with a nonhealing area. He has been using Iodosorb ointment he had apparently from the last stay in this clinic. Also of note over the last 6 weeks he has noted an area that is raised and will scab over but then he traumatizes a scab and then this bleeds and reopens. This is on the left posterior calf. All of this appears to be on the background of chronic venous insufficiency. He has had DVT studies in the past that were negative in 2019. He is also had arterial studies that showed an ABI on the right of 0.76 and on the left of 1.05. Past medical history; the patient states that he is not a diabetic although I note there is some  suggested he was in the note from last year. He has a history of coronary artery disease. He tells me that he has had a history of skin cancer but he is not sure which one but that includes one on the left upper thigh. ABIs done previously 0.76 on the right and 1.05 on the left 6/26; patient readmitted to the clinic last week. He had a traumatic area over the left lateral malleolus as well as a hyper granulated growth on the left posterior calf. He also has severe chronic venous insufficiency. 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 We have been using Iodoflex to the traumatic wound under compression. 7/9; area over the left lateral malleolus continues to require debridement  and we are continuing with Iodoflex under compression. He has severe chronic venous insufficiency. 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 This patient has severe bilateral venous insufficiency and venous hypertension with skin changes resulting. He requires bilateral reflux studies Finally he traumatized his right lateral calf while doing yard work about a week ago. The area is small with a flap of skin over the majority of the wound but I am not sure this is going to remain viable 7/16; still not a viable surface on the left lateral malleolus in fact this wound is deeper. We have been using Iodoflex. 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 He has severe bilateral venous insufficiency and I have ordered reflux studies these apparently are booked for next week. Our intake nurse noted some yellowish-green drainage coming from the wound on the left ankle. Patient was insistent on talking about doing carotid ultrasounds and he was  hoping to get these done with his reflux studies next week. I spent a few minutes talking to him about this I just could not put together in a logical way what he is concerned about. At the end of this I asked him to see his primary doctor about this he says he does not have a primary doctor advised him to get a primary doctor to go over this with him. It was not really clear where the level of concern was specific to carotid ultrasound 7/23; a better looking surface on the left lateral malleolus although it still requires debridement. He tells me he has an appointment to [facility] surgery center next week. Small area on the right lateral calf. The patient went to [facility] I do not think they took his compression wraps off. He did have reflux on the left in the common femoral greater saphenous and saphenofemoral junction and the great saphenous vein in the proximal thigh. 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 Looking at the numbers it would appear that the maximal reflux was in the great saphenous vein in the proximal thigh. As usual I am not sure what would be possible to do here. I will send him to vascular surgery 7/30; the patient still requires debridement in the left lateral malleolus although in general the wound is still deep and and punched out. We are using Sorbact. The area on the right lateral calf is improved. 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 The patient does not have a vascular surgery appointment nor does he have a skin surgery center appointment. I am really not sure what the issues are here although I know vascular surgery as well behind. 8/6-Patient does not have a vascular appointment nor does he have a skin surgery appointment firmed up at this time although he says he is called them  both, impressed upon him that he should see whoever is available for the vascular so the vein studies can be interpreted. 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 He is here for the left lateral malleoli wound for which we are using SOrbact, and he is disappointed the wound is not healing 8/21; patient saw Dr. Doren Custard on 01/21/2019. Dr. Doren Custard was concerned about his arterial flow. Noting that he had trouble feeling the dorsalis pedis and posterior tibial arteries also noteworthy that the Doppler had a barely biphasic posterior tibial signal with a monophasic but fairly brisk dorsalis pedis signal. He is booked for an angiogram next Friday. We have been using 3 layer compression on him. 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 Given Dr. Mee Hives concern it was difficult for me to be comfortable with him going ahead with the cancer extraction and I have suggested canceling this. Finally he has an increase in the erythematous contact dermatitis in the entire wrapped area of the left leg which is probably an allergy to the contact layer of the 3 layer. 8/27; he is going for his angiogram tomorrow by Dr. Doren Custard. We cancel the Mohs surgery on the tumor on his posterior calf pending the angiogram tomorrow. If this shows adequate blood flow to this area I think we can rebook this. His main wound is on the left lateral malleolus a small refractory punched-out area. We have been using Hydrofera Blue 9/3; his angiogram was  done as scheduled. He does indeed have PAD. The common femoral, deep femoral superficial femoral and popliteal arteries will are all widely patent. 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 The overall thought was that he had enough adequate circulation to heal his venous ulcer and continue to use mild elevation and compression. Noted that he had biphasic posterior tibial signals with a Doppler ABI of 0.91. It was not felt that the stenosis in the proximal tibial artery would be associated with increased blood flow he will follow-up with Dr. Doren Custard in 1 month He now needs to get the skin surgery appointment set up. The area is growing larger and bleeding frequently on the posterior left calf per the patient 02/11/19-Patient is back after 1 week, results of his vascular studies are noted, patient needs to have a skin surgery appointment established apparently he did not return the calls this week. The area on the posterior calf is where the skin surgery needs to be done, the left lateral malleoli are wound appears to be about the same we will using Hydrofera Blue change 9/24; patient finally has a booking to deal with the cancer on his posterior calf I believe with the skin surgery center. Not much change in the wound over the left lateral malleolus. It is been 2 weeks since he was here. I changed him to endoform last time 10/2; the patient has been to see dermatology. Scheduled for surgery on 10/14. Using endoform to the wound over the lateral malleolus some improvement in the wound condition but not the surface area. He has severe chronic  venous insufficiency. He also has some arterial disease he was angiogram by Dr. Doren Custard. He did not think that anything needed to be done from an intervention point of  view. 10/8; his surgery is still scheduled for 10/14. It is concerning about whether he is healed or maintained skin integrity and is sutured area given the severity of his chronic venous insufficiency. The area we are following is on the left lateral malleolus. Some improvement in the surface area. No debridement today we have been using endoform 10/15; the patient finally had his surgery for the tumor on his left posterior calf. We did not look at this today. Still a punched out area in the lower left malleolus. Perhaps some mild improvement we have been using endoform We were denied for Grafix and still have not heard back on Oasis 10/22; no real change in this wound. I am able to debride it to a healthy looking surface but the next week there is recurrent debris and no improvement in depth. Punched out over the left lateral malleolus. We have not heard back on Oasis. We were denied for Grafix 11/6; wound is perhaps slightly smaller. Still punched out. He was approved for Oasis we applied Oasis #1. His surgical wound on the back of the left calf was reviewed by dermatology we are not reviewing this 11/13; I think the wound is filled in somewhat. Certainly a much more vibrant looking surface. Oasis #2. I looked at his surgical site on the back of the calf today for the first time. Clean incision small open areas 11/19; measurements not much different today. Still 0.4 cm in depth although post debridement the wound surface looks better. Oasis #3 the site on the back of his calf looks satisfactory. I put him in 4 layer compression last week he seems to tolerate this. He is complaining of some tenderness around the wound. This was controlled swelling around the wound. He seems to have tolerated this 04/28/2019 on evaluation today patient appears to be doing slightly better based on measurements compared to last week. He is here for Oasis application #4 to the ankle region. Fortunately there is no  signs of other wounds open at this point and no signs of infection. 12/3; Oasis #5. We have a better looking wound surface but not much change in depth at 0.6 cm. 12/11; Oasis #6 again some improvement this week better looking surface. Perhaps some improvement in depth Objective Constitutional Blood pressure extremely high unfortunately this was not recognized in the clinic. Pulse regular and within target range for patient.Marland Kitchen Respirations regular, non-labored and within target range.. Temperature is normal and within the target range for the patient.Marland Kitchen Appears in no distress. Vitals Time Taken: 4:24 PM, Height: 73 in, Weight: 205 lbs, BMI: 27, Temperature: 98.9 F, Pulse: 81 bpm, Respiratory Rate: 18 breaths/min, Blood Pressure: 215/118 mmHg. General Notes: patient reports he ahs not taken meds times 2 days, patient denies lightheadedness/ dizziness, blurred vision, niose bleed, ringing of ears, oriented times 3 , notified Dr Dellia Nims Eyes Conjunctivae clear. No discharge.no icterus. Respiratory work of breathing is normal. Cardiovascular Dorsalis pedis pulses always been strong in the left foot. General Notes: Wound exam; no debridement. Surface of the wound looks better. It appears to have contracted. The granulation at the base of the wound looks reasonably healthy. Integumentary (Hair, Skin) Wound #4 status is Open. Original cause of wound was Trauma. The wound is located on the Left,Lateral Malleolus. The wound measures 0.9cm length x 0.8cm width  x 0.4cm depth; 0.565cm^2 area and 0.226cm^3 volume. There is Fat Layer (Subcutaneous Tissue) Exposed exposed. There is no tunneling or undermining noted. There is a medium amount of serosanguineous drainage noted. The wound margin is well defined and not attached to the wound base. There is small (1-33%) pink, pale granulation within the wound bed. There is a large (67-100%) amount of necrotic tissue within the wound bed including Adherent  Slough. 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 A skin graft procedure using a bioengineered skin substitute/cellular or tissue based product was performed by [name]., MD with the following instrument(s): Forceps and Scissors. Oasis wound matrix was applied and secured with Steri-Strips. 3 sq cm of product was utilized and 7 sq cm was wasted due to wound size. Post Application, adaptic was applied. A Time Out was conducted at 16:00, prior to the start of the procedure. The procedure was tolerated well with a pain level of 0 throughout and a pain level of 0 following the procedure. Post procedure Diagnosis Wound #4: Same as Pre-Procedure . Pre-procedure diagnosis of Wound #4 is a Venous Leg Ulcer located on the Left,Lateral Malleolus . There was a Four Layer Compression Therapy Procedure by [name], RN. Post procedure Diagnosis Wound #4: Same as Pre-Procedure Plan Follow-up Appointments: Return Appointment in 1 week. Dressing Change Frequency: Wound #4 Left,Lateral Malleolus: Do not change entire dressing for one week. Skin Barriers/Peri-Wound Care: TCA Cream or Ointment - liberal to lower leg Wound Cleansing: Wound #4 Left,Lateral Malleolus: May shower with protection. Primary Wound Dressing: Wound #4 Left,Lateral Malleolus: Skin Substitute Application - Oasis Burn Matrix #6. secured with adaptic and steri-strips. leave in place. do not remove. Secondary Dressing: Wound #4 Left,Lateral Malleolus: Foam - foam donut. Dry Gauze Drawtex Other: - pad the left lateral and anterior portion of lower leg. Edema Control: 4  layer compression: Left lower extremity - no kerlix no cotton. pad with foam to anterior portion of lower leg. Avoid standing for long periods of time Elevate legs to the level of the heart or above for 30 minutes daily and/or when sitting, a frequency of: - throughout the day Exercise regularly 1. Oasis #6 applied. We are making decent progress 2. Severe hypertension. The patient stated in clinic he did not take his blood pressure medications for 2 days. Unfortunately this was not recognized by provider while he was here. We will call him and direct him to check this at home on his own machine and if equally high to go to an urgent care Electronic Signature(s) Signed: 05/14/2019 6:34:01 PM By: Michael Ham MD Entered By: [name] on 05/14/2019 17:59:28 -------------------------------------------------------------------------------- SuperBill Details Patient Name: Date of Service: [name] 05/14/2019 Medical Record [id number] Patient Account Number: [id number] Date of Birth/Sex: Treating RN: March 17, 1951 (68 y.o. 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, including hip Facility Procedures CPT4: Code [id number] Q4 Description: 103 -  Dermal substitute tissue/non-human origin w metabolically active elements- Oasis (Burn Matrix)product applied per sq cm (Facility Only) Modifier Quantity: 10 CPT4: [id number] Description: 271 - SKIN SUB GRAFT TRNK/ARM/LEG ICD-10 Diagnosis Description L97.321 Non-pressure chronic ulcer of left ankle limited to  breakdown Modifier Quantity: 1 of skin Physician Procedures CPT4 Code Description: [id number] - WC PHYS SKIN SUB GRAFT TRNK/ARM/LEG ICD-10 Diagnosis Description [id number] Non-pressure chronic ulcer of left ankle limited to break Modifier: down of skin Quantity: 1 Electronic Signature(s) Signed: 05/14/2019 6:34:01 PM By: Michael Ham MD Signed: 05/17/2019 5:56:42 PM By: [name] Entered By: [name] on 05/14/2019 18:05:33

## 2019-05-20 ENCOUNTER — Encounter (HOSPITAL_BASED_OUTPATIENT_CLINIC_OR_DEPARTMENT_OTHER): Payer: Medicare Other | Admitting: Internal Medicine

## 2019-05-20 DIAGNOSIS — E11621 Type 2 diabetes mellitus with foot ulcer: Secondary | ICD-10-CM | POA: Diagnosis not present

## 2019-05-20 NOTE — Progress Notes (Signed)
Michael, [name] ([id number]) Visit Report for 05/20/2019 Cellular or Tissue Based Product Details Patient Name: Date of Service: [name], [name] 05/20/2019 1:15 PM Medical Record [id number] Patient Account Number: [id number] Date of Birth/Sex: 1951-01-09 (68 y.o. Male) Treating RN: Primary Care Provider: PATIENT, NO Other Clinician: Referring Provider: Treating Provider/Extender:Dejon Jungman, [name], Designated Weeks in Treatment: 25 Cellular or Tissue Based Wound #4 Left,Lateral Malleolus Product Type Applied to: Performed By: Physician Ricard Dillon., MD Cellular or Tissue Based Oasis wound matrix Product Type: Level of Consciousness (Pre- Awake and Alert procedure): Pre-procedure Yes - 16:20 Verification/Time Out Taken: Location: trunk / arms / legs Wound Size (sq cm): 0.42 Product Size (sq cm): 10 Waste Size (sq cm): 4 Waste Reason: wound size Amount of Product Applied (sq cm): 6 Instrument Used: Forceps, Scissors Lot #: [id number] Order #: 7 Expiration Date: 06/05/2020 Fenestrated: No Reconstituted: Yes Solution Type: normal saline Solution Amount: 10 mL Lot #: [id number] Solution Expiration Date: 09/20/2020 Secured: Yes Secured With: Steri-Strips, adaptic Dressing Applied: No Procedural Pain: 0 Post Procedural Pain: 0 Response to Treatment: Procedure was tolerated well Level of Consciousness Awake and Alert (Post-procedure): Post Procedure Diagnosis Same as Pre-procedure Electronic Signature(s) Signed: 05/20/2019 5:01:00 PM By: Linton Ham MD Entered By: Linton Ham on 05/20/2019 16:42:45 -------------------------------------------------------------------------------- HPI Details Patient Name: Date of Service: [name] 05/20/2019 1:15 PM Medical Record [id number] Patient Account Number: [id number] Date of Birth/Sex: Treating RN: 07/18/1950 (68 y.o. Male) Primary Care Provider: PATIENT, NO Other Clinician: Referring  Provider: Treating Provider/Extender:Bellanie Matthew, [name], Designated Weeks in Treatment: 25 History of Present Illness HPI Description: 08/22/17-He is here for initial evaluation of a right heel wound, right toe wound and left heel fissures. He states that he has chronic dry, flaking skin and frequently has cracked heels. He states the right heel wound developed as such but he developed the wound approximately 3 weeks ago when pulling some skin off; he was unaware of the right toe ulcer. He was treating the right heel with over-the-counter triple antibiotic ointment and peroxide. He presented to urgent care on 3/12 with a 2 x 2 centimeter ulceration to the right heel and was discharged with a 10 day prescription for Bactrim. He does have a history of neuropathy, diet controlled diabetic. His does not know his most recent A1c but will be seeing his PCP next week, there is no evidence of an A1c in Epic. He denies smoking, does have a history of alcohol use, he admits to 5+/- beers weekly not daily. He did follow-up with cardiology on 3/18 with c/o ble edema, they ordered DVT study which he completed on 3/21. He reports that the bilateral DVT study was negative per the ultrasound technician, no report is available at this time. READMISSION 11/20/2018 this is a now 68 year old man who is here for review of wounds on his lower extremity on the left. We note that he was here on a single visit in March he tells [place] that over the last 2019. His major issue is that he dropped a box and hit the outside of his left lateral malleolus about 3 weeks ago. This is left him with a nonhealing area. He has been using Iodosorb ointment he had apparently from the last stay in this clinic. Also of note over the last 6 weeks he has noted an area that is raised and will scab over but then he traumatizes a scab and then this bleeds and reopens. This is on the left posterior calf. All of  this appears to be on the  background of chronic venous insufficiency. He has had DVT studies in the past that were negative in 2019. He is also had arterial studies that showed an ABI on the right of 0.76 and on the left of 1.05. Past medical history; the patient states that he is not a diabetic although I note there is some suggested he was in the note from last year. He has a history of coronary artery disease. He tells me that he has had a history of skin cancer but he is not sure which one but that includes one on the left upper thigh. ABIs done previously 0.76 on the right and 1.05 on the left 6/26; patient readmitted to the clinic last week. He had a traumatic area over the left lateral malleolus as well as a hyper granulated growth on the left posterior calf. He also has severe chronic venous insufficiency. 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 We have been using Iodoflex to the traumatic wound under compression. 7/9; area over the left lateral malleolus continues to require debridement and we are continuing with Iodoflex under compression. He has severe chronic venous insufficiency. 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 This patient has severe bilateral venous insufficiency and venous hypertension with skin changes resulting. He requires bilateral reflux studies Finally he traumatized his right lateral calf while doing yard work about a week ago. The area is small with a flap of skin over the majority of the wound but I am not sure this is going to remain viable 7/16; still not a viable surface on the left lateral malleolus in fact this wound is deeper. We have been using Iodoflex. 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 He has severe bilateral venous insufficiency and I have ordered reflux studies these apparently are booked for next week. Our intake nurse noted some yellowish-green drainage coming from the wound on the left ankle. Patient was insistent on talking about doing carotid ultrasounds and he was hoping to get these done with his reflux studies next week. I spent a few minutes talking to him about this I just could not put together in a logical way what he is concerned about. At the end of this I asked him to see his primary doctor about this he says he does not have a primary doctor advised him to get a primary doctor to go over this with him. It was not really clear where the level of concern was specific to carotid ultrasound 7/23; a better looking surface on the left lateral malleolus although it still requires debridement. He tells me he has an appointment to [facility] surgery center next week. Small area on the right lateral calf. The patient went to have his [facility] I do not think they took his compression wraps off. He did have reflux on the left in the common femoral greater saphenous and saphenofemoral junction and the great saphenous vein in the proximal thigh. 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  Looking at the numbers it would appear that the maximal reflux was in the great saphenous vein in the proximal thigh. As usual I am not sure what would be possible to do here. I will send him to vascular surgery 7/30; the patient still requires debridement in the left lateral malleolus although in general the wound is still deep and and punched out. We are using Sorbact. The area on the right lateral calf is improved. 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 The patient does not have a vascular surgery appointment  nor does he have a skin surgery center appointment. I am really not sure what the issues are here although I know vascular surgery as well behind. 8/6-Patient does not have a vascular appointment nor does he have a skin surgery appointment firmed up at this time although he says he is called them both, impressed upon him that he should see whoever is available for the vascular so the vein studies can be interpreted. 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 He is here for the left lateral malleoli wound for which we are using SOrbact, and he is disappointed the wound is not healing 8/21; patient saw Dr. Doren Custard on 01/21/2019. Dr. Doren Custard was concerned about his arterial flow. Noting that he had trouble feeling the dorsalis pedis and posterior tibial arteries also noteworthy that the Doppler had a barely biphasic posterior tibial signal with a monophasic but fairly brisk dorsalis pedis signal. He is booked for an angiogram next Friday. We have been using 3 layer compression on him. 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 Given Dr. Mee Hives concern it was difficult for me to be comfortable with him going ahead with the cancer extraction and I have suggested canceling this. Finally he has an increase in the erythematous contact dermatitis in the entire wrapped area of the left leg which is probably an allergy to the contact layer of the 3 layer. 8/27; he is going for his angiogram tomorrow by Dr. Doren Custard. We cancel the  Mohs surgery on the tumor on his posterior calf pending the angiogram tomorrow. If this shows adequate blood flow to this area I think we can rebook this. His main wound is on the left lateral malleolus a small refractory punched-out area. We have been using Hydrofera Blue 9/3; his angiogram was done as scheduled. He does indeed have PAD. The common femoral, deep femoral superficial femoral and popliteal arteries will are all widely patent. 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 The overall thought was that he had enough adequate circulation to heal his venous ulcer and continue to use mild elevation and compression. Noted that he had biphasic posterior tibial signals with a Doppler ABI of 0.91. It was not felt that the stenosis  in the proximal tibial artery would be associated with increased blood flow he will follow-up with Dr. Doren Custard in 1 month He now needs to get the skin surgery appointment set up. The area is growing larger and bleeding frequently on the posterior left calf per the patient 02/11/19-Patient is back after 1 week, results of his vascular studies are noted, patient needs to have a skin surgery appointment established apparently he did not return the calls this week. The area on the posterior calf is where the skin surgery needs to be done, the left lateral malleoli are wound appears to be about the same we will using Hydrofera Blue change 9/24; patient finally has a booking to deal with the cancer on his posterior calf I believe with the skin surgery center. Not much change in the wound over the left lateral malleolus. It is been 2 weeks since he was here. I changed him to endoform last time 10/2; the patient has been to see dermatology. Scheduled for surgery on 10/14. Using endoform to  the wound over the lateral malleolus some improvement in the wound condition but not the surface area. He has severe chronic venous insufficiency. He also has some arterial disease he was angiogram by Dr. Doren Custard. He did not think that anything needed to be done from an intervention point of view. 10/8; his surgery is still scheduled for 10/14. It is concerning about whether he is healed or maintained skin integrity and is sutured area given the severity of his chronic venous insufficiency. The area we are following is on the left lateral malleolus. Some improvement in the surface area. No debridement today we have been using endoform 10/15; the patient finally had his surgery for the tumor on his left posterior calf. We did not look at this today. Still a punched out area in the lower left malleolus. Perhaps some mild improvement we have been using endoform We were denied for Grafix and still have not heard back on Oasis 10/22; no real change in this wound. I am able to debride it to a healthy looking surface but the next week there is recurrent debris and no improvement in depth. Punched out over the left lateral malleolus. We have not heard back on Oasis. We were denied for Grafix 11/6; wound is perhaps slightly smaller. Still punched out. He was approved for Oasis we applied Oasis #1. His surgical wound on the back of the left calf was reviewed by dermatology we are not reviewing this 11/13; I think the wound is filled in somewhat. Certainly a much more vibrant looking surface. Oasis #2. I looked at his surgical site on the back of the calf today for the first time. Clean incision small open areas 11/19; measurements not much different today. Still 0.4 cm in depth although post debridement the wound surface looks better. Oasis #3 the site on the back of his calf looks satisfactory. I put him in 4 layer compression last week he seems to tolerate this. He is complaining of some tenderness around  the wound. This was controlled swelling around the wound. He seems to have tolerated this 04/28/2019 on evaluation today patient appears to be doing slightly better based on measurements compared to last week. He is here for Oasis application #4 to the ankle region. Fortunately there is no signs of other wounds open at this point and no signs of infection. 12/3; Oasis #5. We have a better looking wound surface but not much change in depth at 0.6 cm.  12/11; Oasis #6 again some improvement this week better looking surface. Perhaps some improvement in depth 12/17 Oasis number 7.3 cm in depth Electronic Signature(s) Signed: 05/20/2019 5:01:00 PM By: Linton Ham MD Entered By: Linton Ham on 05/20/2019 16:43:03 -------------------------------------------------------------------------------- Physical Exam Details Patient Name: Date of Service: [name], [name] 05/20/2019 1:15 PM Medical Record [id number] Patient Account Number: [id number] Date of Birth/Sex: Treating RN: [birth year] (68 y.o. Male) Primary Care Provider: PATIENT, NO Other Clinician: Referring Provider: Treating Provider/Extender:Micharl Helmes, Legrand Como None, Designated Weeks in Treatment: 25 Constitutional Patient is hypertensive.. Pulse regular and within target range for patient.Marland Kitchen Respirations regular, non-labored and within target range.. Temperature is normal and within the target range for the patient.Marland Kitchen Appears in no distress. Notes Wound exam; no debridement. Surface of the wound looks healthy even under illumination. Oasis #7 Engineer, maintenance) Signed: 05/20/2019 5:01:00 PM By: Linton Ham MD Entered By: Linton Ham on 05/20/2019 16:43:36 -------------------------------------------------------------------------------- Physician Orders Details Patient Name: Date of Service: [name], [name] 05/20/2019 1:15 PM Medical Record [id number] Patient Account Number: [id number] Date of  Birth/Sex: Treating RN: 04/24/1951 (68 y.o. Male) [name] Primary Care Provider: PATIENT, NO Other Clinician: Referring Provider: Treating Provider/Extender:Maziyah Vessel, [name], Designated Weeks in Treatment: 25 Verbal / Phone Orders: No Diagnosis Coding Follow-up Appointments Return Appointment in 1 week. Dressing Change Frequency Wound #4 Left,Lateral Malleolus Do not change entire dressing for one week. Skin Barriers/Peri-Wound Care TCA Cream or Ointment - liberal to lower leg Wound Cleansing Wound #4 Left,Lateral Malleolus May shower with protection. Primary Wound Dressing Wound #4 Left,Lateral Malleolus Skin Substitute Application - Oasis Burn Matrix #7. secured with adaptic and steri-strips. leave in place. do not remove. Secondary Dressing Wound #4 Left,Lateral Malleolus Foam - foam donut. Dry Gauze Drawtex Other: - pad the left lateral and anterior portion of lower leg. Edema Control 4 layer compression: Left lower extremity - no kerlix no cotton. pad with foam to anterior portion of lower leg. Avoid standing for long periods of time Elevate legs to the level of the heart or above for 30 minutes daily and/or when sitting, a frequency of: - throughout the day Exercise regularly Electronic Signature(s) Signed: 05/20/2019 5:01:00 PM By: Linton Ham MD Signed: 05/20/2019 5:34:18 PM By: [name] Entered By: [name] on 05/20/2019 16:27:31 -------------------------------------------------------------------------------- Problem List Details Patient Name: Date of Service: [name], [name] 05/20/2019 1:15 PM Medical Record [id number] Patient Account Number: [id number] Date of Birth/Sex: Treating RN: 08-Mar-1951 (68 y.o. 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 11/20/2018 No Yes breakdown of skin C44.799 Other specified malignant neoplasm of skin of left 11/27/2018 No Yes lower limb, including hip Inactive Problems ICD-10 Code Description Active Date Inactive Date L97.211 Non-pressure chronic ulcer of right calf limited to breakdown of 12/10/2018 12/10/2018 skin Resolved Problems Electronic Signature(s) Signed: 05/20/2019 5:01:00 PM By: Linton Ham MD Entered By: Linton Ham on 05/20/2019 16:42:27 -------------------------------------------------------------------------------- Progress Note Details Patient Name: Date of Service: [name] 05/20/2019 1:15 PM Medical Record [id number] Patient Account Number: [id number] Date of Birth/Sex: Treating RN: 02-20-1951 (68 y.o. Male) Primary Care Provider: PATIENT, NO Other Clinician: Referring Provider: Treating Provider/Extender:Jonerik Sliker, [name], Designated Weeks in Treatment: 25 Subjective History of Present Illness (HPI) 08/22/17-He is here for  initial evaluation of a right heel wound, right toe wound and left heel fissures. He states that he has chronic dry, flaking skin and frequently has cracked heels. He states the right heel wound developed as such but he developed the wound approximately 3 weeks ago when pulling some skin off; he was unaware of the right toe ulcer. He was treating the right heel with over-the-counter triple antibiotic ointment and peroxide. He presented to urgent care on 3/12 with a 2 x 2 centimeter ulceration to the right heel and was discharged with a 10 day prescription for Bactrim. He does have a history of neuropathy, diet controlled diabetic. His does not know his most recent A1c but will be seeing his PCP next  week, there is no evidence of an A1c in Epic. He denies smoking, does have a history of alcohol use, he admits to 5+/- beers weekly not daily. He did follow-up with cardiology on 3/18 with c/o ble edema, they ordered DVT study which he completed on 3/21. He reports that the bilateral DVT study was negative per the ultrasound technician, no report is available at this time. READMISSION 11/20/2018 this is a now 68 year old man who is here for review of wounds on his lower extremity on the left. We note that he was here on a single visit in March he tells [place] that over the last 2019. His major issue is that he dropped a box and hit the outside of his left lateral malleolus about 3 weeks ago. This is left him with a nonhealing area. He has been using Iodosorb ointment he had apparently from the last stay in this clinic. Also of note over the last 6 weeks he has noted an area that is raised and will scab over but then he traumatizes a scab and then this bleeds and reopens. This is on the left posterior calf. All of this appears to be on the background of chronic venous insufficiency. He has had DVT studies in the past that were negative in 2019. He is also had arterial studies that showed an ABI on the right of 0.76 and on the left of 1.05. Past medical history; the patient states that he is not a diabetic although I note there is some suggested he was in the note from last year. He has a history of coronary artery disease. He tells me that he has had a history of skin cancer but he is not sure which one but that includes one on the left upper thigh. ABIs done previously 0.76 on the right and 1.05 on the left 6/26; patient readmitted to the clinic last week. He had a traumatic area over the left lateral malleolus as well as a hyper granulated growth on the left posterior calf. He also has severe chronic venous insufficiency. The biopsy I did I did of the area on the posterior left calf show squamous  cell carcinoma. We have been using Iodoflex to the traumatic wound under compression. 7/9; area over the left lateral malleolus continues to require debridement and we are continuing with Iodoflex under compression. He has severe chronic venous insufficiency. 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 This patient has severe bilateral venous insufficiency and venous hypertension with skin changes resulting. He requires bilateral reflux studies Finally he traumatized his right lateral calf while doing yard work about a week ago.  The area is small with a flap of skin over the majority of the wound but I am not sure this is going to remain viable 7/16; still not a viable surface on the left lateral malleolus in fact this wound is deeper. We have been using Iodoflex. 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 He has severe bilateral venous insufficiency and I have ordered reflux studies these apparently are booked for next week. Our intake nurse noted some yellowish-green drainage coming from the wound on the left ankle. Patient was insistent on talking about doing carotid ultrasounds and he was hoping to get these done with his reflux studies next week. I spent a few minutes talking to him about this I just could not put together in a logical way what he is concerned about. At the end of this I asked him to see his primary doctor about this he says he does not have a primary doctor advised him to get a primary doctor to go over this with him. It was not really clear where the level of concern was specific to carotid ultrasound 7/23; a better looking surface on the left lateral malleolus although it still requires debridement. He tells me he has an appointment  to [facility] surgery center next week. Small area on the right lateral calf. The patient went to [facility] I do not think they took his compression wraps off. He did have reflux on the left in the common femoral greater saphenous and saphenofemoral junction and the great saphenous vein in the proximal thigh. 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 Looking at the numbers it would appear that the maximal reflux was in the great saphenous vein in the proximal thigh. As usual I am not sure what would be possible to do here. I will send him to vascular surgery 7/30; the patient still requires debridement in the left lateral malleolus although in general the wound is still deep and and punched out. We are using Sorbact. The area on the right lateral calf is improved. 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 The patient does not have a vascular surgery appointment nor does he have a skin surgery center appointment. I am really not sure what the issues are here although I know vascular surgery as well behind. 8/6-Patient does not have a vascular appointment nor does he have a skin surgery appointment firmed up at this time although he says he is called them both, impressed upon him that he should see whoever is available for the vascular so the vein studies can be interpreted. 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 He is here for the left lateral malleoli wound for which we are using SOrbact, and he is disappointed  the wound is not healing 8/21; patient saw Dr. Doren Custard on 01/21/2019. Dr. Doren Custard was concerned about his arterial flow. Noting that he had trouble feeling the dorsalis pedis and posterior tibial arteries also noteworthy that the Doppler had a barely biphasic posterior tibial signal with a monophasic but fairly brisk dorsalis pedis signal. He is booked for an angiogram next Friday. We have been using 3 layer compression on him. 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 Given Dr. Mee Hives concern it was difficult for me to be comfortable with him going ahead with the cancer extraction and I have suggested canceling this. Finally he has an increase in the erythematous contact dermatitis in the entire wrapped area of the left leg which is probably an allergy to the contact layer of the 3 layer. 8/27; he is going for his angiogram tomorrow by Dr. Doren Custard. We cancel the Mohs surgery on the tumor on his posterior calf pending the angiogram tomorrow. If this shows adequate blood flow to this area I think we can rebook this. His main wound is on the left lateral malleolus a small refractory punched-out area. We have been using Hydrofera Blue 9/3; his angiogram was done as scheduled. He does indeed have PAD. The common femoral, deep femoral superficial femoral and popliteal arteries will are all widely patent. 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 The overall thought was that he had enough adequate circulation to heal his venous ulcer and continue to use mild elevation and compression. Noted that he had biphasic posterior tibial signals  with a Doppler ABI of 0.91. It was not felt that the stenosis in the proximal tibial artery would be associated with increased blood flow he will follow-up with Dr. Doren Custard in 1 month He now needs to get the skin surgery appointment set up. The area is growing larger and bleeding frequently on the posterior left calf per the patient 02/11/19-Patient is back after 1 week, results of his vascular studies are noted, patient needs to have a skin surgery appointment established apparently he did not return the calls this week. The area on the posterior calf is where the skin surgery needs to be done, the left lateral malleoli are wound appears to be about the same we will using Hydrofera Blue change 9/24; patient finally has a booking to deal with the cancer on his posterior calf I believe with the skin surgery center. Not much change in the wound over the left lateral malleolus. It is been 2 weeks since he was here. I changed him to endoform last time 10/2; the patient has been to see dermatology. Scheduled for surgery on 10/14. Using endoform to the wound over the lateral malleolus some improvement in the wound condition but not the surface area. He has severe chronic venous insufficiency. He also has some arterial disease he was angiogram by Dr. Doren Custard. He did not think that anything needed to be done from an intervention point of view. 10/8; his surgery is still scheduled for 10/14. It is concerning about whether he is healed or maintained skin integrity and is sutured area given the severity of his chronic venous insufficiency. The area we are following is on the left lateral malleolus. Some improvement in the surface area.  No debridement today we have been using endoform 10/15; the patient finally had his surgery for the tumor on his left posterior calf. We did not look at this today. Still a punched out area in the lower left malleolus. Perhaps some mild improvement we have been using endoform  We were denied for Grafix and still have not heard back on Oasis 10/22; no real change in this wound. I am able to debride it to a healthy looking surface but the next week there is recurrent debris and no improvement in depth. Punched out over the left lateral malleolus. We have not heard back on Oasis. We were denied for Grafix 11/6; wound is perhaps slightly smaller. Still punched out. He was approved for Oasis we applied Oasis #1. His surgical wound on the back of the left calf was reviewed by dermatology we are not reviewing this 11/13; I think the wound is filled in somewhat. Certainly a much more vibrant looking surface. Oasis #2. I looked at his surgical site on the back of the calf today for the first time. Clean incision small open areas 11/19; measurements not much different today. Still 0.4 cm in depth although post debridement the wound surface looks better. Oasis #3 the site on the back of his calf looks satisfactory. I put him in 4 layer compression last week he seems to tolerate this. He is complaining of some tenderness around the wound. This was controlled swelling around the wound. He seems to have tolerated this 04/28/2019 on evaluation today patient appears to be doing slightly better based on measurements compared to last week. He is here for Oasis application #4 to the ankle region. Fortunately there is no signs of other wounds open at this point and no signs of infection. 12/3; Oasis #5. We have a better looking wound surface but not much change in depth at 0.6 cm. 12/11; Oasis #6 again some improvement this week better looking surface. Perhaps some improvement in depth 12/17 Oasis number 7.3 cm in depth Objective Constitutional Patient is hypertensive.. Pulse regular and within target range for patient.Marland Kitchen Respirations regular, non-labored and within target range.. Temperature is normal and within the target range for the patient.Marland Kitchen Appears in no distress. Vitals Time  Taken: 3:55 PM, Height: 73 in, Weight: 205 lbs, BMI: 27, Temperature: 98.4 F, Pulse: 75 bpm, Respiratory Rate: 18 breaths/min, Blood Pressure: 166/89 mmHg. General Notes: Wound exam; no debridement. Surface of the wound looks healthy even under illumination. Oasis #7 Integumentary (Hair, Skin) Wound #4 status is Open. Original cause of wound was Trauma. The wound is located on the Left,Lateral Malleolus. The wound measures 0.7cm length x 0.6cm width x 0.3cm depth; 0.33cm^2 area and 0.099cm^3 volume. There is Fat Layer (Subcutaneous Tissue) Exposed exposed. There is no tunneling or undermining noted. There is a medium amount of serosanguineous drainage noted. The wound margin is well defined and not attached to the wound base. There is medium (34-66%) pink granulation within the wound bed. There is a medium (34-66%) amount of necrotic tissue within the wound bed including Adherent Slough. 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 A skin graft procedure using a bioengineered skin substitute/cellular or tissue based product was performed by Dellia Nims,  Memory Argue., MD with the following instrument(s): Forceps and Scissors. Oasis wound matrix was applied and secured with Steri-Strips and adaptic. 6 sq cm of product was utilized and 4 sq cm was wasted due to wound size. Post Application, no dressing was applied. A Time Out was conducted at 16:20, prior to the start of the procedure. The procedure was tolerated well with a pain level of 0 throughout and a pain level of 0 following the procedure. Post procedure Diagnosis Wound #4: Same as  Pre-Procedure . Pre-procedure diagnosis of Wound #4 is a Venous Leg Ulcer located on the Left,Lateral Malleolus . There was a Four Layer Compression Therapy Procedure by Carlene Coria, RN. Post procedure Diagnosis Wound #4: Same as Pre-Procedure Plan Follow-up Appointments: Return Appointment in 1 week. Dressing Change Frequency: Wound #4 Left,Lateral Malleolus: Do not change entire dressing for one week. Skin Barriers/Peri-Wound Care: TCA Cream or Ointment - liberal to lower leg Wound Cleansing: Wound #4 Left,Lateral Malleolus: May shower with protection. Primary Wound Dressing: Wound #4 Left,Lateral Malleolus: Skin Substitute Application - Oasis Burn Matrix #7. secured with adaptic and steri-strips. leave in place. do not remove. Secondary Dressing: Wound #4 Left,Lateral Malleolus: Foam - foam donut. Dry Gauze Drawtex Other: - pad the left lateral and anterior portion of lower leg. Edema Control: 4 layer compression: Left lower extremity - no kerlix no cotton. pad with foam to anterior portion of lower leg. Avoid standing for long periods of time Elevate legs to the level of the heart or above for 30 minutes daily and/or when sitting, a frequency of: - throughout the day Exercise regularly Oasis #7 under 4-layer compression compression Electronic Signature(s) Signed: 05/20/2019 5:01:00 PM By: Linton Ham MD Entered By: Linton Ham on 05/20/2019 16:44:05 -------------------------------------------------------------------------------- SuperBill Details Patient Name: Date of Service: [name] 05/20/2019 Medical Record [id number] Patient Account Number: [id number] Date of Birth/Sex: Treating RN: 1950-06-21 (68 y.o. 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, including hip Facility Procedures CPT4 Code Description: [id number] -Oasis (Wound Matrix) per sq cm Modifier: Quantity: 10 CPT4 Code Description: [id number] - SKIN SUB GRAFT TRNK/ARM/LEG ICD-10 Diagnosis Description L97.321 Non-pressure chronic ulcer of left ankle limited to break Modifier: down of skin Quantity: 1 Physician Procedures CPT4 Code Description: [id number] - WC PHYS SKIN SUB GRAFT TRNK/ARM/LEG ICD-10 Diagnosis Description [id number] Non-pressure chronic ulcer of left ankle limited to breakdo Modifier: wn of skin Quantity: 1 Electronic Signature(s) Signed: 05/20/2019 5:01:00 PM By: Linton Ham MD Entered By: Linton Ham on 05/20/2019 16:45:41

## 2019-05-20 NOTE — Progress Notes (Addendum)
KNIGHT, OELKERS ([id number]) Visit Report for 05/20/2019 Arrival Information Details Patient Name: Date of Service: [name], [name] 05/20/2019 1:15 PM Medical Record [id number] Patient Account Number: [id number] Date of Birth/Sex: Treating RN: Dec 18, 1950 (68 y.o. Male) [name] Primary Care Orval Dortch: PATIENT, NO Other Clinician: Referring Valetta Mulroy: Treating Maya Arcand/Extender:Robson, [name], Designated Weeks in Treatment: 25 Visit Information History Since Last Visit Added or deleted any medications: No Patient Arrived: [name] Any new allergies or adverse reactions: No Arrival Time: 15:54 Had a fall or experienced change in No Accompanied By: alone activities of daily living that may affect Transfer Assistance: None risk of falls: Patient Identification Verified: Yes Signs or symptoms of abuse/neglect since last No Secondary Verification Process Yes visito Completed: Hospitalized since last visit: No Patient Requires Transmission- No Implantable device outside of the clinic excluding No Based Precautions: cellular tissue based products placed in the center Patient Has Alerts: Yes ABIs 08/2018 L1.05 since last visit: Patient Alerts: Has Dressing in Place as Prescribed: Yes R0.76 Has Compression in Place as Prescribed: Yes Pain Present Now: No Electronic Signature(s) Signed: 05/20/2019 5:32:06 PM By: [name] RN, BSN Entered By: [name] on 05/20/2019 15:54:43 -------------------------------------------------------------------------------- Compression Therapy Details Patient Name: Date of Service: [name] 05/20/2019 1:15 PM Medical Record [id number] Patient Account Number: [id number] Date of Birth/Sex: 1950-06-04 (68 y.o. Male) Treating RN: [name] Primary Care Keyari Kleeman: PATIENT, NO Other Clinician: Referring Mackinzie Vuncannon: Treating Morad Tal/Extender:Robson, [name], Designated Weeks in Treatment:  25 Compression Therapy Performed for Wound Wound #4 Left,Lateral Malleolus Assessment: Performed By: Clinician Carlene Coria, RN Compression Type: Four Layer Post Procedure Diagnosis Same as Pre-procedure Electronic Signature(s) Signed: 05/20/2019 5:34:18 PM By: [name] Entered By: [name] on 05/20/2019 16:30:14 -------------------------------------------------------------------------------- Encounter Discharge Information Details Patient Name: Date of Service: [name], [name] 05/20/2019 1:15 PM Medical Record [id number] Patient Account Number: [id number] Date of Birth/Sex: Treating RN: 1950-12-09 (68 y.o. Male) [name] Primary Care Shanie Mauzy: PATIENT, NO Other Clinician: Referring Amirra Herling: Treating Tamaria Dunleavy/Extender:Robson, [name], Designated Weeks in Treatment: 25 Encounter Discharge Information Items Post Procedure Vitals Discharge Condition: Stable Temperature (F): 98.4 Ambulatory Status: Cane Pulse (bpm): 75 Discharge Destination: Home Respiratory Rate (breaths/min): 18 Transportation: Private Auto Blood Pressure (mmHg): 166/89 Accompanied By: alone Schedule Follow-up Appointment: Yes Clinical Summary of Care: Patient Declined Electronic Signature(s) Signed: 05/20/2019 5:32:06 PM By: [name] RN, BSN Entered By: [name] on 05/20/2019 17:25:11 -------------------------------------------------------------------------------- Lower Extremity Assessment Details Patient Name: Date of Service: [name], [name] 05/20/2019 1:15 PM Medical Record [id number] Patient Account Number: [id number] Date of Birth/Sex: Treating RN: [birth year] (68 y.o. Male) [name] Primary Care Willine Schwalbe: PATIENT, NO Other Clinician: Referring Delquan Poucher: Treating Worley Radermacher/Extender:Robson, [name], Designated Weeks in Treatment: 25 Edema Assessment Assessed: [Left: No] [Right: No] Edema: [Left: No] [Right: No] Calf Left:  Right: Point of Measurement: 35 cm From Medial Instep 33 cm cm Ankle Left: Right: Point of Measurement: 9 cm From Medial Instep 23 cm cm Vascular Assessment Pulses: Dorsalis Pedis Palpable: [Left:Yes] Electronic Signature(s) Signed: 05/20/2019 5:32:06 PM By: [name] RN, BSN Entered By: [name] on 05/20/2019 16:05:12 -------------------------------------------------------------------------------- Multi Wound Chart Details Patient Name: Date of Service: [name] 05/20/2019 1:15 PM Medical Record [id number] Patient Account Number: [id number] Date of Birth/Sex: Treating RN: Feb 01, 1951 (68 y.o. Male) Primary Care Arnetra Terris: PATIENT, NO Other Clinician: Referring Jolee Critcher: Treating Dru Primeau/Extender:Robson, Legrand Como None, Designated Weeks in Treatment: 25 Vital Signs Height(in): 73 Pulse(bpm): 75 Weight(lbs): 205 Blood Pressure(mmHg): 166/89 Body Mass Index(BMI): 27 Temperature(F): 98.4 Respiratory 18 Rate(breaths/min): Photos: [  4:No Photos] [N/A:N/A] Wound Location: [4:Left Malleolus - Lateral] [N/A:N/A] Wounding Event: [4:Trauma] [N/A:N/A] Primary Etiology: [4:Venous Leg Ulcer] [N/A:N/A] Comorbid History: [4:Arrhythmia, Coronary Artery N/A Disease, Type II Diabetes] Date Acquired: [4:11/02/2018] [N/A:N/A] Weeks of Treatment: [4:25] [N/A:N/A] Wound Status: [4:Open] [N/A:N/A] Measurements L x W x D 0.7x0.6x0.3 [N/A:N/A] (cm) Area (cm) : [4:0.33] [N/A:N/A] Volume (cm) : [4:0.099] [N/A:N/A] % Reduction in Area: [4:74.50%] [N/A:N/A] % Reduction in Volume: 23.80% [N/A:N/A] Classification: [4:Full Thickness Without Exposed Support Structures] [N/A:N/A] Exudate Amount: [4:Medium] [N/A:N/A] Exudate Type: [4:Serosanguineous] [N/A:N/A] Exudate Color: [4:red, brown] [N/A:N/A] Wound Margin: [4:Well defined, not attached N/A] Granulation Amount: [4:Medium (34-66%)] [N/A:N/A] Granulation Quality: [4:Pink] [N/A:N/A] Necrotic Amount: [4:Medium  (34-66%)] [N/A:N/A] Exposed Structures: [4:Fat Layer (Subcutaneous N/A Tissue) Exposed: Yes Fascia: No Tendon: No Muscle: No Joint: No Bone: No] Epithelialization: [4:Small (1-33%)] [N/A:N/A] Procedures Performed: [4:Cellular or Tissue Based N/A Product Compression Therapy] Treatment Notes Electronic Signature(s) Signed: 05/20/2019 5:01:00 PM By: Linton Ham MD Entered By: Linton Ham on 05/20/2019 16:42:35 -------------------------------------------------------------------------------- Multi-Disciplinary Care Plan Details Patient Name: Date of Service: [name]. 05/20/2019 1:15 PM Medical Record [id number] Patient Account Number: [id number] Date of Birth/Sex: Treating RN: [birth year] (68 y.o. Male) [name] Primary Care Alcus Bradly: PATIENT, NO Other Clinician: Referring Nickisha Hum: Treating Brennan Litzinger/Extender:Robson, [name], Designated Weeks in Treatment: 25 Active Inactive Venous Leg Ulcer Nursing Diagnoses: Knowledge deficit related to disease process and management Potential for venous Insuffiency (use before diagnosis confirmed) Goals: Patient will maintain optimal edema control Date Initiated: 11/20/2018 Target Resolution Date: 06/11/2019 Goal Status: Active Patient/caregiver will verbalize understanding of disease process and disease management Date Initiated: 11/20/2018 Date Inactivated: 12/17/2018 Target Resolution Date: 12/18/2018 Goal Status: Met Interventions: Assess peripheral edema status every visit. Compression as ordered Provide education on venous insufficiency Treatment Activities: Therapeutic compression applied : 11/20/2018 Notes: Electronic Signature(s) Signed: 05/20/2019 5:34:18 PM By: [name] Entered By: [name] on 05/20/2019 16:27:38 -------------------------------------------------------------------------------- Pain Assessment Details Patient Name: Date of Service: [name], [name] 05/20/2019 1:15  PM Medical Record [id number] Patient Account Number: [id number] Date of Birth/Sex: Treating RN: March 10, 1951 (68 y.o. Male) [name] Primary Care Keilany Burnette: PATIENT, NO Other Clinician: Referring Bassheva Flury: Treating Alannah Averhart/Extender:Robson, [name], Designated Weeks in Treatment: 25 Active Problems Location of Pain Severity and Description of Pain Patient Has Paino No Site Locations Pain Management and Medication Current Pain Management: Electronic Signature(s) Signed: 05/20/2019 5:32:06 PM By: [name] RN, BSN Entered By: [name] on 05/20/2019 15:54:51 -------------------------------------------------------------------------------- Patient/Caregiver Education Details Patient Name: [name] 12/17/2020andnbsp1:15 Date of Service: PM Medical Record [id number] Number: Patient Account Number: [id number] Treating RN: 08/25/1950 (68 y.o. [name] Date of Birth/Gender: Male) Other Clinician: Primary Care Physician:PATIENT, NO Treating Linton Ham Referring Physician: Physician/Extender: None, Designated Weeks in Treatment: 25 Education Assessment Education Provided To: Patient Education Topics Provided Venous: Handouts: Managing Venous Disease and Related Ulcers Methods: Explain/Verbal Responses: Reinforcements needed Electronic Signature(s) Signed: 05/20/2019 5:34:18 PM By: [name] Entered By: [name] on 05/20/2019 16:27:51 -------------------------------------------------------------------------------- Wound Assessment Details Patient Name: Date of Service: [name], [name] 05/20/2019 1:15 PM Medical Record [id number] Patient Account Number: [id number] Date of Birth/Sex: Treating RN: 28-May-1951 (68 y.o. Male) [name] Primary Care Mahum Betten: PATIENT, NO Other Clinician: Referring Lylianna Fraiser: Treating Venson Ferencz/Extender:Robson, [name], Designated Weeks in Treatment: 25 Wound Status Wound  Number: 4 Primary Venous Leg Ulcer Etiology: Wound Location: Left Malleolus - Lateral Wound Open Wounding Event: Trauma Status: Date Acquired: 11/02/2018 Comorbid Arrhythmia, Coronary Artery Disease, Weeks Of Treatment: 25 History: Type II Diabetes Clustered Wound: No Photos Wound  Measurements Length: (cm) 0.7 % Redu Width: (cm) 0.6 % Redu Depth: (cm) 0.3 Epithe Area: (cm) 0.33 Tunne Volume: (cm) 0.099 Under Wound Description Full Thickness Without Exposed Support Classification: Structures Wound Well defined, not attached Margin: Exudate Medium Amount: Exudate Serosanguineous Type: Exudate red, brown Color: Wound Bed Granulation Amount: Medium (34-66%) Granulation Quality: Pink Necrotic Amount: Medium (34-66%) Necrotic Quality: Adherent Slough Foul Odor After Cleansing: No Slough/Fibrino Yes Exposed Structure Fascia Exposed: No Fat Layer (Subcutaneous Tissue) Exposed: Yes Tendon Exposed: No Muscle Exposed: No Joint Exposed: No Bone Exposed: No ction in Area: 74.5% ction in Volume: 23.8% lialization: Small (1-33%) ling: No mining: No Treatment Notes Wound #4 (Left, Lateral Malleolus) 1. Cleanse With Soap and water 2. Periwound Care Moisturizing lotion TCA Cream 3. Primary Dressing Applied Cellular Based Tissue Product 4. Secondary Dressing Dry Gauze Foam Drawtex 6. Support Layer Applied 4 layer compression wrap Notes oasis burn applied by MD and covered with adaptic and secured with steristrips. no kerlix and no cotton. Electronic Signature(s) Signed: 05/24/2019 6:06:17 PM By: Mikeal Hawthorne EMT/HBOT Signed: 05/25/2019 6:26:19 PM By: [name] RN, BSN Previous Signature: 05/20/2019 5:32:06 PM Version By: [name] RN, BSN Entered By: Mikeal Hawthorne on 05/24/2019 17:09:40 -------------------------------------------------------------------------------- Vitals Details Patient Name: Date of Service: [name], [name] 05/20/2019 1:15  PM Medical Record [id number] Patient Account Number: [id number] Date of Birth/Sex: Treating RN: Oct 04, 1950 (68 y.o. Male) [name] Primary Care Arianne Klinge: PATIENT, NO Other Clinician: Referring Metta Koranda: Treating Leary Mcnulty/Extender:Robson, [name], Designated Weeks in Treatment: 25 Vital Signs Time Taken: 15:55 Temperature (F): 98.4 Height (in): 73 Pulse (bpm): 75 Weight (lbs): 205 Respiratory Rate (breaths/min): 18 Body Mass Index (BMI): 27 Blood Pressure (mmHg): 166/89 Reference Range: 80 - 120 mg / dl Electronic Signature(s) Signed: 05/20/2019 5:32:06 PM By: [name] RN, BSN Entered By: [name] on 05/20/2019 15:57:24

## 2019-05-21 ENCOUNTER — Encounter (HOSPITAL_BASED_OUTPATIENT_CLINIC_OR_DEPARTMENT_OTHER): Payer: Medicare Other | Admitting: Internal Medicine

## 2019-05-26 ENCOUNTER — Encounter (HOSPITAL_BASED_OUTPATIENT_CLINIC_OR_DEPARTMENT_OTHER): Payer: Medicare Other | Admitting: Physician Assistant

## 2019-05-26 ENCOUNTER — Other Ambulatory Visit: Payer: Self-pay

## 2019-05-26 ENCOUNTER — Other Ambulatory Visit (HOSPITAL_COMMUNITY): Payer: Self-pay | Admitting: Internal Medicine

## 2019-05-26 ENCOUNTER — Ambulatory Visit (HOSPITAL_COMMUNITY)
Admission: RE | Admit: 2019-05-26 | Discharge: 2019-05-26 | Disposition: A | Discharge status: Home / Self Care | Payer: Medicare Other | Source: Ambulatory Visit | Attending: Internal Medicine | Admitting: Internal Medicine

## 2019-05-26 DIAGNOSIS — L97329 Non-pressure chronic ulcer of left ankle with unspecified severity: Secondary | ICD-10-CM | POA: Diagnosis present

## 2019-05-26 DIAGNOSIS — E11621 Type 2 diabetes mellitus with foot ulcer: Secondary | ICD-10-CM | POA: Diagnosis not present

## 2019-05-26 NOTE — Progress Notes (Addendum)
ESSEX, PERRY ([id number]) Visit Report for 05/26/2019 Arrival Information Details Patient Name: Date of Service: OREOLUWA, AIGNER 05/26/2019 3:30 PM Medical Record [id number] Patient Account Number: [id number] Date of Birth/Sex: Treating RN: 1951-03-08 (68 y.o. [name] Primary Care Myran Arcia: PATIENT, NO Other Clinician: Referring Levetta Bognar: Treating Uniqua Kihn/Extender:Stone III, Margarita Grizzle None, Designated Weeks in Treatment: 26 Visit Information History Since Last Visit Added or deleted any medications: No Patient Arrived: Cane Any new allergies or adverse reactions: No Arrival Time: 15:43 Had a fall or experienced change in No Accompanied By: self activities of daily living that may affect Transfer Assistance: None risk of falls: Patient Identification Verified: Yes Signs or symptoms of abuse/neglect since last No Secondary Verification Process Yes visito Completed: Hospitalized since last visit: No Patient Requires Transmission- No Implantable device outside of the clinic excluding No Based Precautions: cellular tissue based products placed in the center Patient Has Alerts: Yes ABIs 08/2018 L1.05 since last visit: Patient Alerts: Has Dressing in Place as Prescribed: Yes R0.76 Has Compression in Place as Prescribed: Yes Pain Present Now: No Electronic Signature(s) Signed: 05/26/2019 4:54:21 PM By: [name] Entered By: [name] on 05/26/2019 15:43:34 -------------------------------------------------------------------------------- Compression Therapy Details Patient Name: Date of Service: KJUAN, SEIPP 05/26/2019 3:30 PM Medical Record [id number] Patient Account Number: [id number] Date of Birth/Sex: Treating RN: 29-Dec-1950 (68 y.o. [name] Primary Care Mariana Wiederholt: PATIENT, NO Other Clinician: Referring Diamon Reddinger: Treating Yahir Tavano/Extender:Stone III, Margarita Grizzle None, Designated Weeks in Treatment: 26 Compression  Therapy Performed for Wound Wound #4 Left,Lateral Malleolus Assessment: Performed By: Jake Church, RN Compression Type: Four Layer Post Procedure Diagnosis Same as Pre-procedure Electronic Signature(s) Signed: 05/26/2019 4:50:49 PM By: Baruch Gouty RN, BSN Entered By: Baruch Gouty on 05/26/2019 16:41:58 -------------------------------------------------------------------------------- Encounter Discharge Information Details Patient Name: Date of Service: EVIN, LOISEAU 05/26/2019 3:30 PM Medical Record [id number] Patient Account Number: [id number] Date of Birth/Sex: Treating RN: 1950-06-24 (68 y.o. [name] Primary Care Stormy Connon: PATIENT, NO Other Clinician: Referring Noorah Giammona: Treating Kemontae Dunklee/Extender:Stone III, Margarita Grizzle None, Designated Weeks in Treatment: 26 Encounter Discharge Information Items Post Procedure Vitals Discharge Condition: Stable Temperature (F): 98.2 Ambulatory Status: Ambulatory Pulse (bpm): 85 Discharge Destination: Home Respiratory Rate (breaths/min): 19 Transportation: Private Auto Blood Pressure (mmHg): 179/94 Accompanied By: self Schedule Follow-up Appointment: Yes Clinical Summary of Care: Electronic Signature(s) Signed: 05/26/2019 5:07:15 PM By: [name] Entered By: [name] on 05/26/2019 16:55:13 -------------------------------------------------------------------------------- Lower Extremity Assessment Details Patient Name: Date of Service: AMONTE, BROOKOVER 05/26/2019 3:30 PM Medical Record [id number] Patient Account Number: [id number] Date of Birth/Sex: Treating RN: Nov 09, 1950 (68 y.o. [name] Primary Care Chana Lindstrom: PATIENT, NO Other Clinician: Referring Cayce Quezada: Treating Lynix Bonine/Extender:Stone III, Margarita Grizzle None, Designated Weeks in Treatment: 26 Edema Assessment Assessed: [Left: No] [Right: No] Edema: [Left: N] [Right: o] Calf Left: Right: Point of Measurement: 35 cm From  Medial Instep 34.8 cm cm Ankle Left: Right: Point of Measurement: 9 cm From Medial Instep 24 cm cm Vascular Assessment Pulses: Dorsalis Pedis Palpable: [Left:Yes] Electronic Signature(s) Signed: 05/26/2019 4:54:21 PM By: [name] Entered By: [name] on 05/26/2019 15:48:56 -------------------------------------------------------------------------------- Multi-Disciplinary Care Plan Details Patient Name: Date of Service: [name] 05/26/2019 3:30 PM Medical Record [id number] Patient Account Number: [id number] Date of Birth/Sex: Treating RN: 1951-02-19 (68 y.o. [name] Primary Care Kayleena Eke: PATIENT, NO Other Clinician: Referring Arvella Massingale: Treating Camauri Fleece/Extender:Stone III, Margarita Grizzle None, Designated Weeks in Treatment: 26 Active Inactive Venous Leg Ulcer Nursing Diagnoses: Knowledge deficit related to disease process and management Potential for venous Insuffiency (use  before diagnosis confirmed) Goals: Patient will maintain optimal edema control Date Initiated: 11/20/2018 Target Resolution Date: 06/11/2019 Goal Status: Active Patient/caregiver will verbalize understanding of disease process and disease management Date Initiated: 11/20/2018 Date Inactivated: 12/17/2018 Target Resolution Date: 12/18/2018 Goal Status: Met Interventions: Assess peripheral edema status every visit. Compression as ordered Provide education on venous insufficiency Treatment Activities: Therapeutic compression applied : 11/20/2018 Notes: Electronic Signature(s) Signed: 05/26/2019 4:50:49 PM By: Baruch Gouty RN, BSN Entered By: Baruch Gouty on 05/26/2019 16:31:12 -------------------------------------------------------------------------------- Pain Assessment Details Patient Name: Date of Service: DARRALL, STREY 05/26/2019 3:30 PM Medical Record [id number] Patient Account Number: [id number] Date of Birth/Sex: Treating RN: 19-Jan-1951 (68  y.o. [name] Primary Care Loula Marcella: PATIENT, NO Other Clinician: Referring Candance Bohlman: Treating Tezra Mahr/Extender:Stone III, Margarita Grizzle None, Designated Weeks in Treatment: 26 Active Problems Location of Pain Severity and Description of Pain Patient Has Paino No Site Locations Pain Management and Medication Current Pain Management: Electronic Signature(s) Signed: 05/26/2019 4:54:21 PM By: [name] Entered By: [name] on 05/26/2019 15:46:26 -------------------------------------------------------------------------------- Patient/Caregiver Education Details [name] 12/23/2020andnbsp3:30 Patient Name: Date of Service: F. PM Medical Record Patient Account Number: [id number] Medical Record Patient Account Number: [id number] Number: Treating RN: Baruch Gouty Date of Birth/Gender: 1950/07/04 (68 y.o. M) Other Clinician: Primary Care Physician: PATIENT, NO Treating Worthy Keeler Referring Physician: Physician/Extender: None, Designated Weeks in Treatment: 26 Education Assessment Education Provided To: Patient Education Topics Provided Venous: Methods: Explain/Verbal Responses: Reinforcements needed, State content correctly Wound/Skin Impairment: Methods: Explain/Verbal Responses: Reinforcements needed, State content correctly Electronic Signature(s) Signed: 05/26/2019 4:50:49 PM By: Baruch Gouty RN, BSN Entered By: Baruch Gouty on 05/26/2019 16:31:34 -------------------------------------------------------------------------------- Wound Assessment Details Patient Name: Date of Service: KENY, DONALD 05/26/2019 3:30 PM Medical Record [id number] Patient Account Number: [id number] Date of Birth/Sex: Treating RN: Jan 17, 1951 (68 y.o. [name] Primary Care Shainna Faux: PATIENT, NO Other Clinician: Referring Tyliah Schlereth: Treating Rand Etchison/Extender:Stone III, Margarita Grizzle None, Designated Weeks in Treatment: 26 Wound  Status Wound Number: 4 Primary Venous Leg Ulcer Etiology: Wound Location: Left Malleolus - Lateral Wound Open Wounding Event: Trauma Status: Date Acquired: 11/02/2018 Comorbid Arrhythmia, Coronary Artery Disease, Weeks Of Treatment: 26 History: Type II Diabetes Clustered Wound: No Photos Wound Measurements Length: (cm) 0.9 % Reduc Width: (cm) 0.7 % Reduc Depth: (cm) 0.4 Epithel Area: (cm) 0.495 Tunnel Volume: (cm) 0.198 Underm Wound Description Classification: Full Thickness Without Exposed Support Structures Wound Well defined, not attached Margin: Exudate Medium Amount: Exudate Serosanguineous Type: Exudate red, brown Color: Wound Bed Granulation Amount: Medium (34-66%) Granulation Quality: Red, Pink Necrotic Amount: Small (1-33%) Necrotic Quality: Adherent Slough Foul Odor After Cleansing: No Slough/Fibrino Yes Exposed Structure Fascia Exposed: No Fat Layer (Subcutaneous Tissue) Exposed: Yes Tendon Exposed: No Muscle Exposed: No Joint Exposed: No Bone Exposed: No tion in Area: 61.8% tion in Volume: -52.3% ialization: Small (1-33%) ing: No ining: No Treatment Notes Wound #4 (Left, Lateral Malleolus) 1. Cleanse With Wound Cleanser Soap and water 2. Periwound Care Moisturizing lotion TCA Cream 3. Primary Dressing Applied Cellular Based Tissue Product Other primary dressing (specifiy in notes) 4. Secondary Dressing Foam 6. Support Layer Applied 4 layer compression wrap Notes oasis burn applied by MD and covered with adaptic and secured with steristrips. no kerlix and no cotton. foam donut as secondary dressing. Electronic Signature(s) Signed: 05/31/2019 4:06:01 PM By: Mikeal Hawthorne EMT/HBOT Signed: 05/31/2019 5:46:04 PM By: [name] Previous Signature: 05/26/2019 4:54:21 PM Version By: [name] Entered By: Mikeal Hawthorne on 05/31/2019  14:14:12 -------------------------------------------------------------------------------- Vitals Details Patient Name: Date  of Service: SHAQUILL, ISEMAN 05/26/2019 3:30 PM Medical Record [id number] Patient Account Number: [id number] Date of Birth/Sex: Treating RN: 1951/01/22 (68 y.o. [name] Primary Care Leonie Amacher: PATIENT, NO Other Clinician: Referring Nili Honda: Treating Keyon Winnick/Extender:Stone III, Margarita Grizzle None, Designated Weeks in Treatment: 26 Vital Signs Time Taken: 15:40 Temperature (F): 98.2 Height (in): 73 Pulse (bpm): 85 Weight (lbs): 205 Respiratory Rate (breaths/min): 19 Body Mass Index (BMI): 27 Blood Pressure (mmHg): 179/94 Reference Range: 80 - 120 mg / dl Electronic Signature(s) Signed: 05/26/2019 4:54:21 PM By: [name] Entered By: [name] on 05/26/2019 15:46:19

## 2019-05-26 NOTE — Progress Notes (Addendum)
[name], [name] ([id number]) Visit Report for 05/26/2019 Chief Complaint Document Details Patient Name: Date of Service: [name], [name] 05/26/2019 3:30 PM Medical Record [id number] Patient Account Number: [id number] Date of Birth/Sex: Treating RN: [birth year] (68 y.o. M) Primary Care Provider: PATIENT, NO Other Clinician: Referring Provider: Treating Provider/Extender:[name] None, Designated Weeks in Treatment: 26 Information Obtained from: Patient Chief Complaint He is here for evaluation of a multiple ulcers/wounds 11/20/2018; patient is here for review of wounds on his left lower extremity Electronic Signature(s) Signed: 05/26/2019 3:35:21 PM By: Michael Keeler PA-C Entered By: [name] on 05/26/2019 15:35:21 -------------------------------------------------------------------------------- Cellular or Tissue Based Product Details Patient Name: [name], [name]. Date of Service: 05/26/2019 3:30 PM Medical Record [id number] Patient Account Number: [id number] Date of Birth/Sex: Treating RN: 02/27/1951 (68 y.o. [name] Primary Care Provider: PATIENT, NO Other Clinician: Referring Provider: Treating Provider/Extender:Michael III, [name], Designated Weeks in Treatment: 26 Cellular or Tissue Based Wound #4 Left,Lateral Malleolus Product Type Applied to: Performed By: Physician Michael Keeler, PA Cellular or Tissue Based Oasis wound matrix Product Type: Level of Consciousness (Pre- Awake and Alert procedure): Pre-procedure Yes - 16:36 Verification/Time Out Taken: Location: trunk / arms / legs Wound Size (sq cm): 0.63 Product Size (sq cm): 10 Waste Size (sq cm): 5 Waste Reason: wound size Amount of Product Applied (sq cm): 5 Instrument Used: Forceps, Scissors Lot #: [id number] Order #: 8 Expiration Date: 06/05/2020 Fenestrated: No Reconstituted: Yes Solution Type: saline Solution Amount: 5 ml Lot #: [id number] Solution  Expiration Date: 09/30/2020 Secured: Yes Secured With: Steri-Strips Dressing Applied: Yes Primary Dressing: adaptic, gauze Procedural Pain: 0 Post Procedural Pain: 0 Response to Treatment: Procedure was tolerated well Level of Consciousness Awake and Alert (Post-procedure): Post Procedure Diagnosis Same as Pre-procedure Electronic Signature(s) Signed: 05/26/2019 4:50:49 PM By: Michael Gouty RN, BSN Signed: 05/26/2019 4:54:22 PM By: Michael Keeler PA-C Entered By: [name] on 05/26/2019 16:41:24 -------------------------------------------------------------------------------- HPI Details Patient Name: Date of Service: [name] 05/26/2019 3:30 PM Medical Record [id number] Patient Account Number: [id number] Date of Birth/Sex: Treating RN: 1950-07-17 (68 y.o. M) Primary Care Provider: PATIENT, NO Other Clinician: Referring Provider: Treating Provider/Extender:Michael III, [name] None, Designated Weeks in Treatment: 26 History of Present Illness HPI Description: 08/22/17-He is here for initial evaluation of a right heel wound, right toe wound and left heel fissures. He states that he has chronic dry, flaking skin and frequently has cracked heels. He states the right heel wound developed as such but he developed the wound approximately 3 weeks ago when pulling some skin off; he was unaware of the right toe ulcer. He was treating the right heel with over-the-counter triple antibiotic ointment and peroxide. He presented to urgent care on 3/12 with a 2 x 2 centimeter ulceration to the right heel and was discharged with a 10 day prescription for Bactrim. He does have a history of neuropathy, diet controlled diabetic. His does not know his most recent A1c but will be seeing his PCP next week, there is no evidence of an A1c in Epic. He denies smoking, does have a history of alcohol use, he admits to 5+/- beers weekly not daily. He did follow-up with cardiology on 3/18  with c/o ble edema, they ordered DVT study which he completed on 3/21. He reports that the bilateral DVT study was negative per the ultrasound technician, no report is available at this time. READMISSION 11/20/2018 this is a now 69 year old man who is here for review of  wounds on his lower extremity on the left. We note that he was here on a single visit in March he tells [place] that over the last 2019. His major issue is that he dropped a box and hit the outside of his left lateral malleolus about 3 weeks ago. This is left him with a nonhealing area. He has been using Iodosorb ointment he had apparently from the last stay in this clinic. Also of note over the last 6 weeks he has noted an area that is raised and will scab over but then he traumatizes a scab and then this bleeds and reopens. This is on the left posterior calf. All of this appears to be on the background of chronic venous insufficiency. He has had DVT studies in the past that were negative in 2019. He is also had arterial studies that showed an ABI on the right of 0.76 and on the left of 1.05. Past medical history; the patient states that he is not a diabetic although I note there is some suggested he was in the note from last year. He has a history of coronary artery disease. He tells me that he has had a history of skin cancer but he is not sure which one but that includes one on the left upper thigh. ABIs done previously 0.76 on the right and 1.05 on the left 6/26; patient readmitted to the clinic last week. He had a traumatic area over the left lateral malleolus as well as a hyper granulated growth on the left posterior calf. He also has severe chronic venous insufficiency. 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 We have been using Iodoflex to the traumatic wound under compression. 7/9; area over the left lateral malleolus continues to require debridement and we are continuing  with Iodoflex under compression. He has severe chronic venous insufficiency. 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 This patient has severe bilateral venous insufficiency and venous hypertension with skin changes resulting. He requires bilateral reflux studies Finally he traumatized his right lateral calf while doing yard work about a week ago. The area is small with a flap of skin over the majority of the wound but I am not sure this is going to remain viable 7/16; still not a viable surface on the left lateral malleolus in fact this wound is deeper. We have been using Iodoflex. 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 He has severe bilateral venous insufficiency and I have ordered reflux studies these apparently are booked for next week. Our intake nurse noted some yellowish-green drainage coming from the wound on the left ankle. Patient was insistent on talking about doing carotid ultrasounds and he was hoping to get these done with his reflux studies next week. I spent a few minutes talking to him about this I just could not put together in a logical way what he is concerned about. At the end of this I asked him to see his primary doctor about this he says he does not have a primary doctor advised him to get a primary doctor to go over this with him. It was not really clear where the level of concern  was specific to carotid ultrasound 7/23; a better looking surface on the left lateral malleolus although it still requires debridement. He tells me he has an appointment to Mohs surgery center next week. Small area on the right lateral calf. The patient went to have his reflux studies at [facility] I do not think they took his compression wraps  off. He did have reflux on the left in the common femoral greater saphenous and saphenofemoral junction and the great saphenous vein in the proximal thigh. 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 Looking at the numbers it would appear that the maximal reflux was in the great saphenous vein in the proximal thigh. As usual I am not sure what would be possible to do here. I will send him to vascular surgery 7/30; the patient still requires debridement in the left lateral malleolus although in general the wound is still deep and and punched out. We are using Sorbact. The area on the right lateral calf is improved. 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 The patient does not have a vascular surgery appointment nor does he have a skin surgery center appointment. I am really not sure what the issues are here although I know vascular surgery as well behind. 8/6-Patient does not have a vascular appointment nor does he have a skin surgery appointment firmed up at this time although he says he is called them both, impressed upon him that he should see whoever is available for the vascular so the vein studies can be interpreted. 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 He is here for the left lateral malleoli wound for which we are using SOrbact, and he is disappointed the wound is not healing 8/21; patient saw Dr. Doren Custard on  01/21/2019. Dr. Doren Custard was concerned about his arterial flow. Noting that he had trouble feeling the dorsalis pedis and posterior tibial arteries also noteworthy that the Doppler had a barely biphasic posterior tibial signal with a monophasic but fairly brisk dorsalis pedis signal. He is booked for an angiogram next Friday. We have been using 3 layer compression on him. 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 Given Dr. Mee Hives concern it was difficult for me to be comfortable with him going ahead with the cancer extraction and I have suggested canceling this. Finally he has an increase in the erythematous contact dermatitis in the entire wrapped area of the left leg which is probably an allergy to the contact layer of the 3 layer. 8/27; he is going for his angiogram tomorrow by Dr. Doren Custard. We cancel the Mohs surgery on the tumor on his posterior calf pending the angiogram tomorrow. If this shows adequate blood flow to this area I think we can rebook this. His main wound is on the left lateral malleolus a small refractory punched-out area. We have been using  Hydrofera Blue 9/3; his angiogram was done as scheduled. He does indeed have PAD. The common femoral, deep femoral superficial femoral and popliteal arteries will are all widely patent. 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 The overall thought was that he had enough adequate circulation to heal his venous ulcer and continue to use mild elevation and compression. Noted that he had biphasic posterior tibial signals with a Doppler ABI of 0.91. It was not felt that the stenosis in the proximal tibial artery would be associated with increased blood flow he will follow-up with Dr. Doren Custard in 1  month He now needs to get the skin surgery appointment set up. The area is growing larger and bleeding frequently on the posterior left calf per the patient 02/11/19-Patient is back after 1 week, results of his vascular studies are noted, patient needs to have a skin surgery appointment established apparently he did not return the calls this week. The area on the posterior calf is where the skin surgery needs to be done, the left lateral malleoli are wound appears to be about the same we will using Hydrofera Blue change 9/24; patient finally has a booking to deal with the cancer on his posterior calf I believe with the skin surgery center. Not much change in the wound over the left lateral malleolus. It is been 2 weeks since he was here. I changed him to endoform last time 10/2; the patient has been to see dermatology. Scheduled for surgery on 10/14. Using endoform to the wound over the lateral malleolus some improvement in the wound condition but not the surface area. He has severe chronic venous insufficiency. He also has some arterial disease he was angiogram by Dr. Doren Custard. He did not think that anything needed to be done from an intervention point of view. 10/8; his surgery is still scheduled for 10/14. It is concerning about whether he is healed or maintained skin integrity and is sutured area given the severity of his chronic venous insufficiency. The area we are following is on the left lateral malleolus. Some improvement in the surface area. No debridement today we have been using endoform 10/15; the patient finally had his surgery for the tumor on his left posterior calf. We did not look at this today. Still a punched out area in the lower left malleolus. Perhaps some mild improvement we have been using endoform We were denied for Grafix and still have not heard back on Oasis 10/22; no real change in this wound. I am able to debride it to a healthy looking surface but the next week there  is recurrent debris and no improvement in depth. Punched out over the left lateral malleolus. We have not heard back on Oasis. We were denied for Grafix 11/6; wound is perhaps slightly smaller. Still punched out. He was approved for Oasis we applied Oasis #1. His surgical wound on the back of the left calf was reviewed by dermatology we are not reviewing this 11/13; I think the wound is filled in somewhat. Certainly a much more vibrant looking surface. Oasis #2. I looked at his surgical site on the back of the calf today for the first time. Clean incision small open areas 11/19; measurements not much different today. Still 0.4 cm in depth although post debridement the wound surface looks  better. Oasis #3 the site on the back of his calf looks satisfactory. I put him in 4 layer compression last week he seems to tolerate this. He is complaining of some tenderness around the wound. This was controlled swelling around the wound. He seems to have tolerated this 04/28/2019 on evaluation today patient appears to be doing slightly better based on measurements compared to last week. He is here for Oasis application #4 to the ankle region. Fortunately there is no signs of other wounds open at this point and no signs of infection. 12/3; Oasis #5. We have a better looking wound surface but not much change in depth at 0.6 cm. 12/11; Oasis #6 again some improvement this week better looking surface. Perhaps some improvement in depth 12/17 Oasis number 7.3 cm in depth 05/26/2019 patient is here for Oasis application #8 in regard to his wound. He is showing evidence of some improvement right now with the Oasis. He did also go for his x-ray today although the results are not yet available in epic for review. Electronic Signature(s) Signed: 05/26/2019 4:48:24 PM By: Michael Keeler PA-C Entered By: [name] on 05/26/2019  16:48:24 -------------------------------------------------------------------------------- Physical Exam Details Patient Name: Date of Service: [name], [name] 05/26/2019 3:30 PM Medical Record [id number] Patient Account Number: [id number] Date of Birth/Sex: Treating RN: July 10, 1950 (68 y.o. M) Primary Care Provider: PATIENT, NO Other Clinician: Referring Provider: Treating Provider/Extender:Michael III, Sheronica Corey None, Designated Weeks in Treatment: 38 Constitutional Well-nourished and well-hydrated in no acute distress. Respiratory normal breathing without difficulty. Psychiatric this patient is able to make decisions and demonstrates good insight into disease process. Alert and Oriented x 3. pleasant and cooperative. Notes Patient's wound bed actually appears to be clean and doing quite well compared to my last evaluation with him. There was no significant slough buildup and I did not have to perform any sharp debridement prior to application of the Oasis. Overall I think things are going quite nicely as far as I can tell at the moment. 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 [name] Primary Care Provider: PATIENT, NO Other Clinician: Referring Provider: Treating Provider/Extender:Michael III, [name], Designated Weeks in Treatment: 26 Verbal / Phone Orders: No Diagnosis Coding ICD-10 Coding Code Description I87.332 Chronic venous hypertension (idiopathic) with ulcer and inflammation of left lower extremity L97.321 Non-pressure chronic ulcer of left ankle  limited to breakdown of skin C44.799 Other specified malignant neoplasm of skin of left lower limb, including hip Follow-up Appointments Return Appointment in 1 week. Dressing Change Frequency Wound #4 Left,Lateral Malleolus Do not change entire dressing for one week. Skin Barriers/Peri-Wound Care TCA Cream or Ointment - liberal to lower leg Wound Cleansing Wound #4 Left,Lateral Malleolus May shower with protection. Primary Wound Dressing Wound #4 Left,Lateral Malleolus Skin Substitute Application - Oasis Burn Matrix #8. secured with adaptic and steri-strips. leave in place. do not remove. Secondary Dressing Wound #4 Left,Lateral Malleolus Foam - foam donut. Dry Gauze Other: - pad the left lateral and anterior portion of lower leg. Edema Control 4 layer compression: Left lower extremity - no kerlix no cotton. pad with foam to anterior portion of lower leg. Avoid standing for long periods of time Elevate legs to the level of  the heart or above for 30 minutes daily and/or when sitting, a frequency of: - throughout the day Exercise regularly Electronic Signature(s) Signed: 05/26/2019 4:50:49 PM By: Michael Gouty RN, BSN Signed: 05/26/2019 4:54:22 PM By: Michael Keeler PA-C Entered By: [name] on 05/26/2019 16:43:36 -------------------------------------------------------------------------------- Problem List Details Patient Name: Date of Service: [name], [name] 05/26/2019 3:30 PM Medical Record [id number] Patient Account Number: [id number] Date of Birth/Sex: Treating RN: 1950/11/27 (68 y.o. M) Primary Care Provider: PATIENT, NO Other Clinician: Referring Provider: Treating Provider/Extender:Michael III, Zarek Relph None, Designated Weeks in Treatment: 26 Active Problems ICD-10 Evaluated Encounter Code Description Active Date Today Diagnosis I87.332 Chronic venous hypertension (idiopathic) with ulcer 11/20/2018 No Yes and inflammation of left lower  extremity L97.321 Non-pressure chronic ulcer of left ankle limited to 11/20/2018 No Yes breakdown of skin C44.799 Other specified malignant neoplasm of skin of left 11/27/2018 No Yes lower limb, including hip Inactive Problems ICD-10 Code Description Active Date Inactive Date L97.211 Non-pressure chronic ulcer of right calf limited to breakdown of 12/10/2018 12/10/2018 skin Resolved Problems Electronic Signature(s) Signed: 05/26/2019 3:35:15 PM By: Michael Keeler PA-C Entered By: [name] on 05/26/2019 15:35:14 -------------------------------------------------------------------------------- Progress Note Details Patient Name: Date of Service: [name], [name] 05/26/2019 3:30 PM Medical Record [id number] Patient Account Number: [id number] Date of Birth/Sex: Treating RN: [birth year] (68 y.o. M) Primary Care Provider: PATIENT, NO Other Clinician: Referring Provider: Treating Provider/Extender:Michael III, Andrue Dini None, Designated Weeks in Treatment: 26 Subjective Chief Complaint Information obtained from Patient He is here for evaluation of a multiple ulcers/wounds 11/20/2018; patient is here for review of wounds on his left lower extremity History of Present Illness (HPI) 08/22/17-He is here for initial evaluation of a right heel wound, right toe wound and left heel fissures. He states that he has chronic dry, flaking skin and frequently has cracked heels. He states the right heel wound developed as such but he developed the wound approximately 3 weeks ago when pulling some skin off; he was unaware of the right toe ulcer. He was treating the right heel with over-the-counter triple antibiotic ointment and peroxide. He presented to urgent care on 3/12 with a 2 x 2 centimeter ulceration to the right heel and was discharged with a 10 day prescription for Bactrim. He does have a history of neuropathy, diet controlled diabetic. His does not know his most recent A1c but will be seeing  his PCP next week, there is no evidence of an A1c in Epic. He denies smoking, does have a history of alcohol use, he admits to 5+/- beers weekly not daily. He did follow-up with cardiology on 3/18 with c/o ble edema, they ordered DVT study which he completed on 3/21. He reports that the bilateral DVT study was negative per the ultrasound technician, no report is available at this time. READMISSION 11/20/2018 this is a now 67 year old man who is here for review of wounds on his lower extremity on the left. We note that he was here on a single visit in March he tells [place] that over the last 2019. His major issue is that he dropped a box and hit the outside of his left lateral malleolus about 3 weeks ago. This is left him with a nonhealing area. He has been using Iodosorb ointment he had apparently from the last stay in this clinic. Also of note over the last 6 weeks he has noted an area that is raised and will scab over but then he traumatizes a scab and then this bleeds  and reopens. This is on the left posterior calf. All of this appears to be on the background of chronic venous insufficiency. He has had DVT studies in the past that were negative in 2019. He is also had arterial studies that showed an ABI on the right of 0.76 and on the left of 1.05. Past medical history; the patient states that he is not a diabetic although I note there is some suggested he was in the note from last year. He has a history of coronary artery disease. He tells me that he has had a history of skin cancer but he is not sure which one but that includes one on the left upper thigh. ABIs done previously 0.76 on the right and 1.05 on the left 6/26; patient readmitted to the clinic last week. He had a traumatic area over the left lateral malleolus as well as a hyper granulated growth on the left posterior calf. He also has severe chronic venous insufficiency. The biopsy I did I did of the area on the posterior left calf  show squamous cell carcinoma. We have been using Iodoflex to the traumatic wound under compression. 7/9; area over the left lateral malleolus continues to require debridement and we are continuing with Iodoflex under compression. He has severe chronic venous insufficiency. 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 This patient has severe bilateral venous insufficiency and venous hypertension with skin changes resulting. He requires bilateral reflux studies Finally he traumatized his right lateral calf while doing yard work about a week ago. The area is small with a flap of skin over the majority of the wound but I am not sure this is going to remain viable 7/16; still not a viable surface on the left lateral malleolus in fact this wound is deeper. We have been using Iodoflex. 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 He has severe bilateral venous insufficiency and I have ordered reflux studies these apparently are booked for next week. Our intake nurse noted some yellowish-green drainage coming from the wound on the left ankle. Patient was insistent on talking about doing carotid ultrasounds and he was hoping to get these done with his reflux studies next week. I spent a few minutes talking to him about this I just could not put together in a logical way what he is concerned about. At the end of this I asked him to see his primary doctor about this he says he does not have a primary doctor advised him to get a primary doctor to go over this with him. It was not really clear where the level of concern was specific to carotid ultrasound 7/23; a better looking surface on the left lateral malleolus although it still requires debridement. He tells me he has an  appointment to [facility] surgery center next week. Small area on the right lateral calf. The patient went to have his reflux [facility] I do not think they took his compression wraps off. He did have reflux on the left in the common femoral greater saphenous and saphenofemoral junction and the great saphenous vein in the proximal thigh. 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 Looking at the numbers it would appear that the maximal reflux was in the great saphenous vein in the proximal thigh. As usual I am not sure what would be possible to do here. I will send him to vascular surgery 7/30; the patient still requires debridement in the left lateral malleolus although in general the wound is still deep and and punched out. We are using Sorbact. The area on the right lateral calf is improved. 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 The patient does not have a vascular surgery appointment nor does he have a skin surgery center appointment. I am really not sure what the issues are here although I know vascular surgery as well behind. 8/6-Patient does not have a vascular appointment nor does he have a skin surgery appointment firmed up at this time although he says he is called them both, impressed upon him that he should see whoever is available for the vascular so the vein studies can be interpreted. 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 He is here for the left lateral malleoli wound for which we are using SOrbact, and he is disappointed the wound is not healing 8/21; patient saw Dr. Doren Custard on 01/21/2019. Dr. Doren Custard was concerned about his arterial flow. Noting that he had trouble feeling the dorsalis pedis and posterior tibial arteries also noteworthy that the Doppler had a barely biphasic posterior tibial signal with a monophasic but fairly brisk dorsalis pedis signal. He is booked for an angiogram next Friday. We have been using 3 layer compression on him. 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 Given Dr. Mee Hives concern it was difficult for me to be comfortable with him going ahead with the cancer extraction and I have suggested canceling this. Finally he has an increase in the erythematous contact dermatitis in the entire wrapped area of the left leg which is probably an allergy to the contact layer of the 3 layer. 8/27; he is going for his angiogram tomorrow by Dr. Doren Custard. We cancel the Mohs surgery on the tumor on his posterior calf pending the angiogram tomorrow. If this shows adequate blood flow to this area I think we can rebook this. His main wound is on the left lateral malleolus a small refractory punched-out area. We have been using Hydrofera Blue 9/3; his angiogram was done as scheduled. He does indeed have PAD. The common femoral, deep femoral superficial femoral and popliteal arteries will are all widely patent. 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 The overall thought was that he had enough adequate circulation to heal his venous ulcer and continue to use mild elevation and compression. Noted that he had biphasic posterior  tibial signals with a  Doppler ABI of 0.91. It was not felt that the stenosis in the proximal tibial artery would be associated with increased blood flow he will follow-up with Dr. Doren Custard in 1 month He now needs to get the skin surgery appointment set up. The area is growing larger and bleeding frequently on the posterior left calf per the patient 02/11/19-Patient is back after 1 week, results of his vascular studies are noted, patient needs to have a skin surgery appointment established apparently he did not return the calls this week. The area on the posterior calf is where the skin surgery needs to be done, the left lateral malleoli are wound appears to be about the same we will using Hydrofera Blue change 9/24; patient finally has a booking to deal with the cancer on his posterior calf I believe with the skin surgery center. Not much change in the wound over the left lateral malleolus. It is been 2 weeks since he was here. I changed him to endoform last time 10/2; the patient has been to see dermatology. Scheduled for surgery on 10/14. Using endoform to the wound over the lateral malleolus some improvement in the wound condition but not the surface area. He has severe chronic venous insufficiency. He also has some arterial disease he was angiogram by Dr. Doren Custard. He did not think that anything needed to be done from an intervention point of view. 10/8; his surgery is still scheduled for 10/14. It is concerning about whether he is healed or maintained skin integrity and is sutured area given the severity of his chronic venous insufficiency. The area we are following is on the left lateral malleolus. Some improvement in the surface area. No debridement today we have been using endoform 10/15; the patient finally had his surgery for the tumor on his left posterior calf. We did not look at this today. Still a punched out area in the lower left malleolus. Perhaps some mild improvement we have been using  endoform We were denied for Grafix and still have not heard back on Oasis 10/22; no real change in this wound. I am able to debride it to a healthy looking surface but the next week there is recurrent debris and no improvement in depth. Punched out over the left lateral malleolus. We have not heard back on Oasis. We were denied for Grafix 11/6; wound is perhaps slightly smaller. Still punched out. He was approved for Oasis we applied Oasis #1. His surgical wound on the back of the left calf was reviewed by dermatology we are not reviewing this 11/13; I think the wound is filled in somewhat. Certainly a much more vibrant looking surface. Oasis #2. I looked at his surgical site on the back of the calf today for the first time. Clean incision small open areas 11/19; measurements not much different today. Still 0.4 cm in depth although post debridement the wound surface looks better. Oasis #3 the site on the back of his calf looks satisfactory. I put him in 4 layer compression last week he seems to tolerate this. He is complaining of some tenderness around the wound. This was controlled swelling around the wound. He seems to have tolerated this 04/28/2019 on evaluation today patient appears to be doing slightly better based on measurements compared to last week. He is here for Oasis application #4 to the ankle region. Fortunately there is no signs of other wounds open at this point and no signs of infection. 12/3; Oasis #5. We have a better looking wound  surface but not much change in depth at 0.6 cm. 12/11; Oasis #6 again some improvement this week better looking surface. Perhaps some improvement in depth 12/17 Oasis number 7.3 cm in depth 05/26/2019 patient is here for Oasis application #8 in regard to his wound. He is showing evidence of some improvement right now with the Oasis. He did also go for his x-ray today although the results are not yet available in epic for review. Patient  History Unable to Obtain Patient History due to Altered Mental Status. Information obtained from Patient. Family History Heart Disease - Father,Paternal Grandparents, Hypertension - Father, Stroke - Maternal Grandparents, No family history of Cancer, Diabetes, Hereditary Spherocytosis, Kidney Disease, Lung Disease, Seizures, Thyroid Problems, Tuberculosis. Social History Never smoker, Marital Status - Married, Alcohol Use - Moderate, Drug Use - No History, Caffeine Use - Moderate. Medical History Eyes Denies history of Cataracts, Glaucoma, Optic Neuritis Ear/Nose/Mouth/Throat Denies history of Chronic sinus problems/congestion, Middle ear problems Hematologic/Lymphatic Denies history of Anemia, Hemophilia, Human Immunodeficiency Virus, Lymphedema, Sickle Cell Disease Respiratory Denies history of Aspiration, Asthma, Chronic Obstructive Pulmonary Disease (COPD), Pneumothorax, Sleep Apnea, Tuberculosis Cardiovascular Patient has history of Arrhythmia, Coronary Artery Disease Denies history of Angina, Congestive Heart Failure, Deep Vein Thrombosis, Hypertension, Hypotension, Myocardial Infarction, Peripheral Arterial Disease, Peripheral Venous Disease, Phlebitis, Vasculitis Gastrointestinal Denies history of Cirrhosis , Colitis, Crohnoos, Hepatitis A, Hepatitis B, Hepatitis C Endocrine Patient has history of Type II Diabetes Denies history of Type I Diabetes Genitourinary Denies history of End Stage Renal Disease Immunological Denies history of Lupus Erythematosus, Raynaudoos, Scleroderma Integumentary (Skin) Denies history of History of Burn Musculoskeletal Denies history of Gout, Rheumatoid Arthritis, Osteoarthritis, Osteomyelitis Neurologic Denies history of Dementia, Neuropathy, Quadriplegia, Paraplegia, Seizure Disorder Oncologic Denies history of Received Chemotherapy, Received Radiation Psychiatric Denies history of Anorexia/bulimia, Confinement  Anxiety Hospitalization/Surgery History - coronary artery bypass. Review of Systems (ROS) Constitutional Symptoms (General Health) Denies complaints or symptoms of Fatigue, Fever, Chills, Marked Weight Change. Respiratory Denies complaints or symptoms of Chronic or frequent coughs, Shortness of Breath. Cardiovascular Denies complaints or symptoms of Chest pain. Psychiatric Denies complaints or symptoms of Claustrophobia, Suicidal. Objective Constitutional Well-nourished and well-hydrated in no acute distress. Vitals Time Taken: 3:40 PM, Height: 73 in, Weight: 205 lbs, BMI: 27, Temperature: 98.2 F, Pulse: 85 bpm, Respiratory Rate: 19 breaths/min, Blood Pressure: 179/94 mmHg. Respiratory normal breathing without difficulty. Psychiatric this patient is able to make decisions and demonstrates good insight into disease process. Alert and Oriented x 3. pleasant and cooperative. General Notes: Patient's wound bed actually appears to be clean and doing quite well compared to my last evaluation with him. There was no significant slough buildup and I did not have to perform any sharp debridement prior to application of the Oasis. Overall I think things are going quite nicely as far as I can tell at the moment. Is Oasis application #8 Integumentary (Hair, Skin) Wound #4 status is Open. Original cause of wound was Trauma. The wound is located on the Left,Lateral Malleolus. The wound measures 0.9cm length x 0.7cm width x 0.4cm depth; 0.495cm^2 area and 0.198cm^3 volume. There is Fat Layer (Subcutaneous Tissue) Exposed exposed. There is no tunneling or undermining noted. There is a medium amount of serosanguineous drainage noted. The wound margin is well defined and not attached to the wound base. There is medium (34-66%) red, pink granulation within the wound bed. There is a small (1-33%) amount of necrotic tissue within the wound bed including Adherent Slough. 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 A skin graft procedure using a bioengineered skin substitute/cellular or tissue based product was performed by Michael Keeler, PA with the following instrument(s): Forceps and Scissors. Oasis wound matrix was applied and secured with Steri- Strips. 5 sq cm of product was utilized and 5 sq cm was wasted due to wound size. Post Application, adaptic, gauze was applied. A Time Out was conducted at 16:36, prior to the start of the procedure. The procedure was tolerated well with a pain level of 0 throughout and a pain level of 0 following the procedure. Post procedure Diagnosis Wound #4: Same as Pre-Procedure . Pre-procedure diagnosis of Wound #4 is a Venous Leg Ulcer located on the Left,Lateral Malleolus . There was a Four Layer Compression Therapy Procedure by Carlene Coria, RN. Post procedure Diagnosis Wound #4: Same as Pre-Procedure Plan Follow-up Appointments: Return Appointment in 1 week. Dressing Change Frequency: Wound #4 Left,Lateral Malleolus: Do not change entire dressing for one week. Skin Barriers/Peri-Wound Care: TCA Cream or Ointment - liberal to lower leg Wound Cleansing: Wound #4 Left,Lateral Malleolus: May shower with protection. Primary Wound Dressing: Wound #4 Left,Lateral Malleolus: Skin Substitute Application - Oasis Burn Matrix #8. secured with adaptic and steri-strips. leave in place. do not remove. Secondary Dressing: Wound #4 Left,Lateral Malleolus: Foam - foam donut. Dry Gauze Other: - pad the left lateral and anterior portion of lower leg. Edema Control: 4 layer compression: Left lower  extremity - no kerlix no cotton. pad with foam to anterior portion of lower leg. Avoid standing for long periods of time Elevate legs to the level of the heart or above for 30 minutes daily and/or when sitting, a frequency of: - throughout the day Exercise regularly 1. Would recommend currently that we continue with the wound care measures as before again I did apply the Oasis here in the clinic today. 2. We will going to recommend that he continue with a 4-layer compression wrap as well that we would use no Curlex or cotton as the patient has reactions to this. 3. I do recommend the patient continue to elevate his legs as much as possible and I think regular exercise can also be of benefit. We will see patient back for reevaluation in 1 week here in the clinic. If anything worsens or changes patient will contact our office for additional recommendations. The patient did mention wanting to see about approval for the Apligraf that Dr. Dellia Nims apparently was previously discussed with him. Again I have not been part of that conversation the patient actually did write a note to Dr. Dellia Nims that I believe him to review tomorrow in that regard. With that being said for the time being we will continue with the orders per above. Electronic Signature(s) Signed: 05/26/2019 4:50:02 PM By: Michael Keeler PA-C Entered By: [name] on 05/26/2019 16:50:02 -------------------------------------------------------------------------------- HxROS Details Patient Name: Date of Service: [name], [name] 05/26/2019 3:30 PM Medical Record [id number] Patient Account Number: [id number] Date of Birth/Sex: Treating RN: [birth year] (68 y.o. M) Primary Care Provider: PATIENT, NO Other Clinician: Referring Provider: Treating Provider/Extender:Michael III, Edwinna Rochette None, Designated Weeks in Treatment: 26 Unable to Obtain Patient History due to Altered Mental Status Information Obtained  From  Patient Constitutional Symptoms (General Health) Complaints and Symptoms: Negative for: Fatigue; Fever; Chills; Marked Weight Change Respiratory Complaints and Symptoms: Negative for: Chronic or frequent coughs; Shortness of Breath Medical History: Negative for: Aspiration; Asthma; Chronic Obstructive Pulmonary Disease (COPD); Pneumothorax; Sleep Apnea; Tuberculosis Cardiovascular Complaints and Symptoms: Negative for: Chest pain Medical History: Positive for: Arrhythmia; Coronary Artery Disease Negative for: Angina; Congestive Heart Failure; Deep Vein Thrombosis; Hypertension; Hypotension; Myocardial Infarction; Peripheral Arterial Disease; Peripheral Venous Disease; Phlebitis; Vasculitis Psychiatric Complaints and Symptoms: Negative for: Claustrophobia; Suicidal Medical History: Negative for: Anorexia/bulimia; Confinement Anxiety Eyes Medical History: Negative for: Cataracts; Glaucoma; Optic Neuritis Ear/Nose/Mouth/Throat Medical History: Negative for: Chronic sinus problems/congestion; Middle ear problems Hematologic/Lymphatic Medical History: Negative for: Anemia; Hemophilia; Human Immunodeficiency Virus; Lymphedema; Sickle Cell Disease Gastrointestinal Medical History: Negative for: Cirrhosis ; Colitis; Crohns; Hepatitis A; Hepatitis B; Hepatitis C Endocrine Medical History: Positive for: Type II Diabetes Negative for: Type I Diabetes Time with diabetes: 8 yrs ago Treated with: Diet Blood sugar tested every day: No Genitourinary Medical History: Negative for: End Stage Renal Disease Immunological Medical History: Negative for: Lupus Erythematosus; Raynauds; Scleroderma Integumentary (Skin) Medical History: Negative for: History of Burn Musculoskeletal Medical History: Negative for: Gout; Rheumatoid Arthritis; Osteoarthritis; Osteomyelitis Neurologic Medical History: Negative for: Dementia; Neuropathy; Quadriplegia; Paraplegia; Seizure  Disorder Oncologic Medical History: Negative for: Received Chemotherapy; Received Radiation Immunizations Pneumococcal Vaccine: Received Pneumococcal Vaccination: Yes Immunization Notes: does not remember when he last had a tetanus shot Implantable Devices None Hospitalization / Surgery History Type of Hospitalization/Surgery coronary artery bypass Family and Social History Cancer: No; Diabetes: No; Heart Disease: Yes - Father,Paternal Grandparents; Hereditary Spherocytosis: No; Hypertension: Yes - Father; Kidney Disease: No; Lung Disease: No; Seizures: No; Stroke: Yes - Maternal Grandparents; Thyroid Problems: No; Tuberculosis: No; Never smoker; Marital Status - Married; Alcohol Use: Moderate; Drug Use: No History; Caffeine Use: Moderate; Financial Concerns: No; Food, Clothing or Shelter Needs: No; Support System Lacking: No; Transportation Concerns: No Physician Affirmation I have reviewed and agree with the above information. Electronic Signature(s) Signed: 05/26/2019 4:54:22 PM By: Michael Keeler PA-C Entered By: [name] on 05/26/2019 16:48:37 -------------------------------------------------------------------------------- SuperBill Details Patient Name: Date of Service: [name], [name] 05/26/2019 Medical Record [id number] Patient Account Number: [id number] Date of Birth/Sex: Treating RN: 1950/11/12 (68 y.o. 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, including hip Facility Procedures CPT4: Code [id number] [id number] Description:  103 - Dermal substitute tissue/non-human origin w metabolically active elements- Oasis (Burn Matrix)product applied per sq cm (Facility Only) 271 - SKIN SUB GRAFT TRNK/ARM/LEG ICD-10 Diagnosis Description L97.321 Non-pressure chronic ulcer  of left ankle limited to breakdown of Modifier Quantity: 10 1 skin Physician Procedures CPT4 Code Description: [id number] - WC PHYS SKIN SUB GRAFT TRNK/ARM/LEG ICD-10 Diagnosis Description L97.321 Non-pressure chronic ulcer of left ankle limited to break Modifier: down of skin Quantity: 1 Electronic Signature(s) Signed: 05/26/2019 4:50:16 PM By: Michael Keeler PA-C Entered By: [name] on 05/26/2019 16:50:15

## 2019-06-02 ENCOUNTER — Encounter (HOSPITAL_BASED_OUTPATIENT_CLINIC_OR_DEPARTMENT_OTHER): Payer: Medicare Other | Admitting: Internal Medicine

## 2019-06-02 ENCOUNTER — Other Ambulatory Visit: Payer: Self-pay

## 2019-06-02 DIAGNOSIS — E11621 Type 2 diabetes mellitus with foot ulcer: Secondary | ICD-10-CM | POA: Diagnosis not present

## 2019-06-02 NOTE — Progress Notes (Signed)
[name], [name] ([id number]) Visit Report for 06/02/2019 Cellular or Tissue Based Product Details Patient Name: [name], [name]. Date of Service: 06/02/2019 9:45 AM Medical Record [id number] Patient Account Number: [id number] Date of Birth/Sex: [birth year] (68 y.o. M) Treating RN: Baruch Gouty Primary Care Provider: PATIENT, NO Other Clinician: Referring Provider: Treating Provider/Extender:Tosh Glaze, Johnsie Cancel, Designated Weeks in Treatment: 27 Cellular or Tissue Based Wound #4 Left,Lateral Malleolus Product Type Applied to: Performed By: Physician Tobi Bastos, MD Cellular or Tissue Based Oasis wound matrix Product Type: Level of Consciousness (Pre- Awake and Alert procedure): Pre-procedure Yes - 10:23 Verification/Time Out Taken: Location: trunk / arms / legs Wound Size (sq cm): 0.64 Product Size (sq cm): 10 Waste Size (sq cm): 6 Waste Reason: wound size Amount of Product Applied (sq cm): 4 Instrument Used: Forceps, Scissors Lot #: [id number] Order #: 9 Expiration Date: 06/04/2020 Fenestrated: No Reconstituted: Yes Solution Type: saline Solution Amount: 3 ml Lot #: [id number] Solution Expiration Date: 09/30/2020 Secured: Yes Secured With: Steri-Strips Dressing Applied: Yes Primary Dressing: adaptic, gauze, foam donut Procedural Pain: 0 Post Procedural Pain: 0 Response to Treatment: Procedure was tolerated well Level of Consciousness Awake and Alert (Post-procedure): Post Procedure Diagnosis Same as Pre-procedure Notes 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 Date of Service: 06/02/2019 9:45 AM Medical Record [id number] Patient Account Number:  [id number] Date of Birth/Sex: Oct 09, 1950 (68 y.o. M) Treating RN: Baruch Gouty Primary Care Provider: PATIENT, NO Other Clinician: Referring Provider: Treating Provider/Extender:Korbin Notaro, Johnsie Cancel, Designated Weeks in Treatment: 27 Debridement Performed for Wound #4 Left,Lateral Malleolus Assessment: Performed By: Physician Tobi Bastos, MD Debridement Type: Debridement Severity of Tissue Pre Fat layer exposed Debridement: Level of Consciousness (Pre- Awake and Alert procedure): Pre-procedure Verification/Time Out Taken: Yes - 10:18 Start Time: 10:19 Pain Control: Lidocaine 4% Topical Solution Total Area Debrided (L x W): 0.8 (cm) x 0.8 (cm) = 0.64 (cm) Tissue and other material Viable, Non-Viable, Slough, Subcutaneous, Skin: Epidermis, Slough debrided: Level: Skin/Subcutaneous Tissue Debridement Description: Excisional Instrument: Curette Bleeding: Minimum Hemostasis Achieved: Pressure End Time: 10:22 Procedural Pain: 4 Post Procedural Pain: 2 Response to Treatment: Procedure was tolerated well Level of Consciousness Awake and Alert (Post-procedure): Post Debridement Measurements of Total Wound Length: (cm) 0.8 Width: (cm) 0.8 Depth: (cm) 0.3 Volume: (cm) 0.151 Character of Wound/Ulcer Post Improved Debridement: Severity of Tissue Post Debridement: Fat layer exposed Post Procedure Diagnosis Same as Pre-procedure Electronic Signature(s) Signed: 06/02/2019 5:10:58 PM By: Tobi Bastos MD, MBA Signed: 06/02/2019 5:40:33 PM By: Baruch Gouty RN, BSN Entered By: Baruch Gouty on 06/02/2019 10:21:10 -------------------------------------------------------------------------------- HPI Details Patient Name: Date of Service: [name], [name] 06/02/2019 9:45 AM Medical Record [id number] Patient Account Number: [id number] Date of Birth/Sex: Treating RN: 17-Apr-1951 (68 y.o. M) Primary Care Provider: PATIENT, NO Other Clinician: Referring Provider:  Treating Provider/Extender:Savas Elvin, Johnsie Cancel, Designated Weeks in Treatment: 27 History of Present Illness HPI Description: 08/22/17-He is here for initial evaluation of a right heel wound, right toe wound and left heel fissures. He states that he has chronic dry, flaking skin and frequently has cracked heels. He states the right heel wound developed as such but he developed the wound approximately 3 weeks ago when pulling some skin off; he was unaware of the right toe ulcer. He was treating the right heel with over-the-counter triple antibiotic ointment and peroxide. He presented to  urgent care on 3/12 with a 2 x 2 centimeter ulceration to the right heel and was discharged with a 10 day prescription for Bactrim. He does have a history of neuropathy, diet controlled diabetic. His does not know his most recent A1c but will be seeing his PCP next week, there is no evidence of an A1c in Epic. He denies smoking, does have a history of alcohol use, he admits to 5+/- beers weekly not daily. He did follow-up with cardiology on 3/18 with c/o ble edema, they ordered DVT study which he completed on 3/21. He reports that the bilateral DVT study was negative per the ultrasound technician, no report is available at this time. READMISSION 11/20/2018 this is a now 68 year old man who is here for review of wounds on his lower extremity on the left. We note that he was here on a single visit in March he tells [place] that over the last 2019. His major issue is that he dropped a box and hit the outside of his left lateral malleolus about 3 weeks ago. This is left him with a nonhealing area. He has been using Iodosorb ointment he had apparently from the last stay in this clinic. Also of note over the last 6 weeks he has noted an area that is raised and will scab over but then he traumatizes a scab and then this bleeds and reopens. This is on the left posterior calf. All of this appears to be on the background of  chronic venous insufficiency. He has had DVT studies in the past that were negative in 2019. He is also had arterial studies that showed an ABI on the right of 0.76 and on the left of 1.05. Past medical history; the patient states that he is not a diabetic although I note there is some suggested he was in the note from last year. He has a history of coronary artery disease. He tells me that he has had a history of skin cancer but he is not sure which one but that includes one on the left upper thigh. ABIs done previously 0.76 on the right and 1.05 on the left 6/26; patient readmitted to the clinic last week. He had a traumatic area over the left lateral malleolus as well as a hyper granulated growth on the left posterior calf. He also has severe chronic venous insufficiency. 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 We have been using Iodoflex to the traumatic wound under compression. 7/9; area over the left lateral malleolus continues to require debridement and we are continuing with Iodoflex under compression. He has severe chronic venous insufficiency. 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 This patient has severe bilateral venous insufficiency and venous hypertension with skin changes resulting. He requires bilateral reflux studies Finally he traumatized his right lateral calf while doing yard work about a week ago. The area is small with a flap of skin over the majority of the wound but I am not sure this is going to remain viable 7/16; still not a viable surface on the left lateral malleolus in fact this wound is deeper. We have been using Iodoflex. 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 He has  severe bilateral venous insufficiency and I have ordered reflux studies these apparently are booked for next week. Our intake nurse noted some yellowish-green drainage coming from the wound on the left ankle. Patient was insistent on talking about doing carotid ultrasounds and he was hoping to get these done with his reflux studies next week. I spent a few minutes talking to him about this I just could not put together in a logical way what he is concerned about. At the end of this I asked him to see his primary doctor about this he says he does not have a primary doctor advised him to get a primary doctor to go over this with him. It was not really clear where the level of concern was specific to carotid ultrasound 7/23; a better looking surface on the left lateral malleolus although it still requires debridement. He tells me he has an appointment to Mohs surgery center next week. Small area on the right lateral calf. The patient went to have his reflux studies at [facility] I do not think they took his compression wraps off. He did have reflux on the left in the common femoral greater saphenous and saphenofemoral junction and the great saphenous vein in the proximal thigh. 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 Looking at the numbers it would appear that the maximal reflux was in the great saphenous vein in the proximal thigh. As usual I am not sure what would be possible to do here. I will send him to vascular surgery 7/30; the patient still requires debridement in the left lateral malleolus although in general the wound is still deep and and punched out. We are using Sorbact. The area on the right lateral calf is improved. 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 The patient does not have a vascular surgery appointment nor does he  have a skin surgery center appointment. I am really not sure what the issues are here although I know vascular surgery as well behind. 8/6-Patient does not have a vascular appointment nor does he have a skin surgery appointment firmed up at this time although he says he is called them both, impressed upon him that he should see whoever is available for the vascular so the vein studies can be interpreted. 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 He is here for the left lateral malleoli wound for which we are using SOrbact, and he is disappointed the wound is not healing 8/21; patient saw Dr. Doren Custard on 01/21/2019. Dr. Doren Custard was concerned about his arterial flow. Noting that he had trouble feeling the dorsalis pedis and posterior tibial arteries also noteworthy that the Doppler had a barely biphasic posterior tibial signal with a monophasic but fairly brisk dorsalis pedis signal. He is booked for an angiogram next Friday. We have been using 3 layer compression on him. Also he is supposed to  have Mohs surgery on the cancer site on his left posterior calf next Wednesday. Given Dr. Mee Hives concern it was difficult for me to be comfortable with him going ahead with the cancer extraction and I have suggested canceling this. Finally he has an increase in the erythematous contact dermatitis in the entire wrapped area of the left leg which is probably an allergy to the contact layer of the 3 layer. 8/27; he is going for his angiogram tomorrow by Dr. Doren Custard. We cancel the Mohs  surgery on the tumor on his posterior calf pending the angiogram tomorrow. If this shows adequate blood flow to this area I think we can rebook this. His main wound is on the left lateral malleolus a small refractory punched-out area. We have been using Hydrofera Blue 9/3; his angiogram was done as scheduled. He does indeed have PAD. The common femoral, deep femoral superficial femoral and popliteal arteries will are all widely patent. 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 The overall thought was that he had enough adequate circulation to heal his venous ulcer and continue to use mild elevation and compression. Noted that he had biphasic posterior tibial signals with a Doppler ABI of 0.91. It was not felt that the stenosis in the proximal tibial artery would be associated with increased blood flow he will follow-up with Dr. Doren Custard in 1 month He now needs to get the skin surgery appointment set up. The area is growing larger and bleeding frequently on the posterior left calf per the patient 02/11/19-Patient is back after 1 week, results of his vascular studies are noted, patient needs to have a skin surgery appointment established apparently he did not return the calls this week. The area on the posterior calf is where the skin surgery needs to be done, the left lateral malleoli are wound appears to be about the same we will using Hydrofera Blue change 9/24; patient finally has a booking to deal with the cancer on his posterior calf I believe with the skin surgery center. Not much change in the wound over the left lateral malleolus. It is been 2 weeks since he was here. I changed him to endoform last time 10/2; the patient has been to see dermatology. Scheduled for surgery on 10/14. Using endoform to the  wound over the lateral malleolus some improvement in the wound condition but not the surface area. He has severe chronic venous insufficiency. He also has some arterial disease he was angiogram by Dr. Doren Custard. He did not think that anything needed to be done from an intervention point of view. 10/8; his surgery is still scheduled for 10/14. It is concerning about whether he is healed or maintained skin integrity and is sutured area given the severity of his chronic venous insufficiency. The area we are following is on the left lateral malleolus. Some improvement in the surface area. No debridement today we have been using endoform 10/15; the patient finally had his surgery for the tumor on his left posterior calf. We did not look at this today. Still a punched out area in the lower left malleolus. Perhaps some mild improvement we have been using endoform We were denied for Grafix and still have not heard back on Oasis 10/22; no real change in this wound. I am able to debride it  to a healthy looking surface but the next week there is recurrent debris and no improvement in depth. Punched out over the left lateral malleolus. We have not heard back on Oasis. We were denied for Grafix 11/6; wound is perhaps slightly smaller. Still punched out. He was approved for Oasis we applied Oasis #1. His surgical wound on the back of the left calf was reviewed by dermatology we are not reviewing this 11/13; I think the wound is filled in somewhat. Certainly a much more vibrant looking surface. Oasis #2. I looked at his surgical site on the back of the calf today for the first time. Clean incision small open areas 11/19; measurements not much different today. Still 0.4 cm in depth although post debridement the wound surface looks better. Oasis #3 the site on the back of his calf looks satisfactory. I put him in 4 layer compression last week he seems to tolerate this. He is complaining of some tenderness around the  wound. This was controlled swelling around the wound. He seems to have tolerated this 04/28/2019 on evaluation today patient appears to be doing slightly better based on measurements compared to last week. He is here for Oasis application #4 to the ankle region. Fortunately there is no signs of other wounds open at this point and no signs of infection. 12/3; Oasis #5. We have a better looking wound surface but not much change in depth at 0.6 cm. 12/11; Oasis #6 again some improvement this week better looking surface. Perhaps some improvement in depth 12/17 Oasis number 7.3 cm in depth 05/26/2019 patient is here for Oasis application #8 in regard to his wound. He is showing evidence of some improvement right now with the Oasis. He did also go for his x-ray today although the results are not yet available in epic for review. 12/30- Patient returns at 1 week for Oasis application #9 with regard to his left lower extremity foot wound, which is showing some evidence of improvement, the x-ray that was done last week did show slight periosteal elevation and therefore he would be in line to get an MRI to confirm these findings Electronic Signature(s) Signed: 06/02/2019 11:07:09 AM By: Tobi Bastos MD, MBA Entered By: Tobi Bastos on 06/02/2019 11:07:09 -------------------------------------------------------------------------------- Physical Exam Details Patient Name: Date of Service: [name], [name] 06/02/2019 9:45 AM Medical Record [id number] Patient Account Number: [id number] Date of Birth/Sex: Treating RN: April 26, 1951 (68 y.o. M) Primary Care Provider: PATIENT, NO Other Clinician: Referring Provider: Treating Provider/Extender:Dafna Romo, Odelia Gage None, Designated Weeks in Treatment: 27 Constitutional alert and oriented x 3. sitting or standing blood pressure is within target range for patient.. supine blood pressure is within target range for patient.. pulse regular and within  target range for patient.Marland Kitchen respirations regular, non-labored and within target range for patient.Marland Kitchen temperature within target range for patient.. . . Well-nourished and well-hydrated in no acute distress. Notes Left lower extremity foot ulcer, looking reasonably good, I debrided the base and the edges with a #3 curette, the Oasis #9 application was applied today Electronic Signature(s) Signed: 06/02/2019 11:07:49 AM By: Tobi Bastos MD, MBA Entered By: Tobi Bastos on 06/02/2019 11:07:48 -------------------------------------------------------------------------------- Physician Orders Details Patient Name: Date of Service: [name], [name] 06/02/2019 9:45 AM Medical Record [id number] Patient Account Number: [id number] Date of Birth/Sex: Treating RN: May 26, 1951 (68 y.o. [name] Primary Care Provider: PATIENT, NO Other Clinician: Referring Provider: Treating Provider/Extender:Nasim Habeeb, Johnsie Cancel, Designated Weeks in Treatment: 89 Verbal / Phone Orders: No Diagnosis Coding ICD-10 Coding Code Description  [id number] Chronic venous hypertension (idiopathic) with ulcer and inflammation of left lower extremity L97.321 Non-pressure chronic ulcer of left ankle limited to breakdown of skin C44.799 Other specified malignant neoplasm of skin of left lower limb, including hip Follow-up Appointments Return Appointment in 1 week. - Thursday Dressing Change Frequency Wound #4 Left,Lateral Malleolus Do not change entire dressing for one week. Skin Barriers/Peri-Wound Care TCA Cream or Ointment - liberal to lower leg Wound Cleansing Wound #4 Left,Lateral Malleolus May shower with protection. Primary Wound Dressing Wound #4 Left,Lateral Malleolus Skin Substitute Application - Oasis Burn Matrix #9. secured with adaptic and steri-strips. leave in place. do not remove. Secondary Dressing Wound #4 Left,Lateral Malleolus Foam - foam donut. Dry Gauze Other: - pad the left  lateral and anterior portion of lower leg. Edema Control 3 Layer Compression System - Left Lower Extremity - no kerlix no cotton. pad with foam to anterior portion of lower leg. Avoid standing for long periods of time Elevate legs to the level of the heart or above for 30 minutes daily and/or when sitting, a frequency of: - throughout the day Exercise regularly Radiology MRI, lower extremity with/without contast left ankle - nonhealing ulcer left lateral ankle CPT - (ICD10 L97.321 - Non-pressure chronic ulcer of left ankle limited to breakdown of skin) Electronic Signature(s) Signed: 06/02/2019 5:10:58 PM By: Tobi Bastos MD, MBA Signed: 06/02/2019 5:40:33 PM By: Baruch Gouty RN, BSN Entered By: Baruch Gouty on 06/02/2019 10:35:37 -------------------------------------------------------------------------------- Prescription 06/02/2019 Patient Name: [name] Provider: Tobi Bastos MD Date of Birth: Jul 30, 1950 NPI#: [id number] Sex: M DEA#: [id number] Phone #: [phone] License #: Patient Address: [address] [place], [place] [address] [place], [place] [address] [phone] Allergies Compazine Provider's Orders MRI, lower extremity with/without contast left ankle - ICD10: L97.321 - nonhealing ulcer left lateral ankle CPT Signature(s): Date(s): Electronic Signature(s) Signed: 06/02/2019 5:10:58 PM By: Tobi Bastos MD, MBA Signed: 06/02/2019 5:40:33 PM By: Baruch Gouty RN, BSN Entered By: Baruch Gouty on 06/02/2019 10:35:37 --------------------------------------------------------------------------------  Problem List Details Patient Name: Date of Service: [name], [name] 06/02/2019 9:45 AM Medical Record [id number] Patient Account Number: [id number] Date of Birth/Sex: Treating RN: 10-05-1950 (68 y.o. [name] Primary Care Provider: PATIENT,  NO Other Clinician: Referring Provider: Treating Provider/Extender:Regnia Mathwig, Johnsie Cancel, Designated Weeks in Treatment: 27 Active Problems ICD-10 Evaluated Encounter Code Description Active Date Today Diagnosis I87.332 Chronic venous hypertension (idiopathic) with ulcer 11/20/2018 No Yes and inflammation of left lower extremity L97.321 Non-pressure chronic ulcer of left ankle limited to 11/20/2018 No Yes breakdown of skin C44.799 Other specified malignant neoplasm of skin of left 11/27/2018 No Yes lower limb, including hip Inactive Problems ICD-10 Code Description Active Date Inactive Date L97.211 Non-pressure chronic ulcer of right calf limited to breakdown of 12/10/2018 12/10/2018 skin Resolved Problems Electronic Signature(s) Signed: 06/02/2019 5:10:58 PM By: Tobi Bastos MD, MBA Signed: 06/02/2019 5:40:33 PM By: Baruch Gouty RN, BSN Entered By: Baruch Gouty on 06/02/2019 10:06:35 -------------------------------------------------------------------------------- Progress Note Details Patient Name: Date of Service: [name], [name] 06/02/2019 9:45 AM Medical Record [id number] Patient Account Number: [id number] Date of Birth/Sex: Treating RN: [birth year] (68 y.o. M) Primary Care Provider: PATIENT, NO Other Clinician: Referring Provider: Treating Provider/Extender:Deitrich Steve, Odelia Gage None, Designated Weeks in Treatment: 27 Subjective History of Present Illness (HPI) 08/22/17-He is here for initial evaluation of a right heel wound, right toe wound and left heel fissures. He states that he has chronic dry, flaking skin and frequently  has cracked heels. He states the right heel wound developed as such but he developed the wound approximately 3 weeks ago when pulling some skin off; he was unaware of the right toe ulcer. He was treating the right heel with over-the-counter triple antibiotic ointment and peroxide. He presented to urgent care on 3/12 with a 2 x 2 centimeter  ulceration to the right heel and was discharged with a 10 day prescription for Bactrim. He does have a history of neuropathy, diet controlled diabetic. His does not know his most recent A1c but will be seeing his PCP next week, there is no evidence of an A1c in Epic. He denies smoking, does have a history of alcohol use, he admits to 5+/- beers weekly not daily. He did follow-up with cardiology on 3/18 with c/o ble edema, they ordered DVT study which he completed on 3/21. He reports that the bilateral DVT study was negative per the ultrasound technician, no report is available at this time. READMISSION 11/20/2018 this is a now 68 year old man who is here for review of wounds on his lower extremity on the left. We note that he was here on a single visit in March he tells [place] that over the last 2019. His major issue is that he dropped a box and hit the outside of his left lateral malleolus about 3 weeks ago. This is left him with a nonhealing area. He has been using Iodosorb ointment he had apparently from the last stay in this clinic. Also of note over the last 6 weeks he has noted an area that is raised and will scab over but then he traumatizes a scab and then this bleeds and reopens. This is on the left posterior calf. All of this appears to be on the background of chronic venous insufficiency. He has had DVT studies in the past that were negative in 2019. He is also had arterial studies that showed an ABI on the right of 0.76 and on the left of 1.05. Past medical history; the patient states that he is not a diabetic although I note there is some suggested he was in the note from last year. He has a history of coronary artery disease. He tells me that he has had a history of skin cancer but he is not sure which one but that includes one on the left upper thigh. ABIs done previously 0.76 on the right and 1.05 on the left 6/26; patient readmitted to the clinic last week. He had a traumatic area  over the left lateral malleolus as well as a hyper granulated growth on the left posterior calf. He also has severe chronic venous insufficiency. 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 We have been using Iodoflex to the traumatic wound under compression. 7/9; area over the left lateral malleolus continues to require debridement and we are continuing with Iodoflex under compression. He has severe chronic venous insufficiency. 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 This patient has severe bilateral venous insufficiency and venous hypertension with skin changes resulting. He requires bilateral reflux studies Finally he traumatized his right lateral calf while doing yard work about a week ago. The area is small with a flap of skin over the majority of the wound but I am not sure this is going to remain  viable 7/16; still not a viable surface on the left lateral malleolus in fact this wound is deeper. We have been using Iodoflex. 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 He has severe bilateral venous insufficiency and I have ordered reflux studies these apparently are booked for next week. Our intake nurse noted some yellowish-green drainage coming from the wound on the left ankle. Patient was insistent on talking about doing carotid ultrasounds and he was hoping to get these done with his reflux studies next week. I spent a few minutes talking to him about this I just could not put together in a logical way what he is concerned about. At the end of this I asked him to see his primary doctor about this he says he does not have a primary doctor advised him to get a primary doctor to go over this with him.  It was not really clear where the level of concern was specific to carotid ultrasound 7/23; a better looking surface on the left lateral malleolus although it still requires debridement. He tells me he has an appointment to Mohs surgery center next week. Small area on the right lateral calf. The patient went to have his [facility] I do not think they took his compression wraps off. He did have reflux on the left in the common femoral greater saphenous and saphenofemoral junction and the great saphenous vein in the proximal thigh. 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 Looking at the numbers it would appear that the maximal reflux was in the great saphenous vein in the proximal thigh. As usual I am not sure what would be possible to do here. I will send him to vascular surgery 7/30; the patient still requires debridement in the left lateral malleolus although in general the wound is still deep and and punched out. We are using Sorbact. The area on the right lateral calf is improved. 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 The patient does not have a vascular surgery appointment nor does he have a skin surgery center appointment. I am really not sure what the issues are here although I know vascular surgery as well behind. 8/6-Patient does not have a vascular appointment nor does he have a skin surgery appointment firmed up at this time although he says he is called them both, impressed upon him that he should see whoever is available for the vascular so the vein studies can be interpreted. Stressed again the importance of having the skin cancer removed, patient expressed concerns about wound healing from that wound which I reassured would be reasonably good chances to heal, At any rate excision of the squamous cancer  is a priority. 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 He is here for the left lateral malleoli wound for which we are using SOrbact, and he is disappointed the wound is not healing 8/21; patient saw Dr. Doren Custard on 01/21/2019. Dr. Doren Custard was concerned about his arterial flow. Noting that he had trouble  feeling the dorsalis pedis and posterior tibial arteries also noteworthy that the Doppler had a barely biphasic posterior tibial signal with a monophasic but fairly brisk dorsalis pedis signal. He is booked for an angiogram next Friday. We have been using 3 layer compression on him. 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 Given Dr. Mee Hives concern it was difficult for me to be comfortable with him going ahead with the cancer extraction and I have suggested canceling this. Finally he has an increase in the erythematous contact dermatitis in the entire wrapped area of the left leg which is probably an allergy to the contact layer of the 3 layer. 8/27; he is going for his angiogram tomorrow by Dr. Doren Custard. We cancel the Mohs surgery on the tumor on his posterior calf pending the angiogram tomorrow. If this shows adequate blood flow to this area I think we can rebook this. His main wound is on the left lateral malleolus a small refractory punched-out area. We have been using Hydrofera Blue 9/3; his angiogram was done as scheduled. He does indeed have PAD. The common femoral, deep femoral superficial femoral and popliteal arteries will are all widely patent. There was single-vessel runoff on the left via the posterior tibial artery that had 1 focal area with perhaps 40 to 50% stenosis the anterior tibial and peroneal arteries are occluded he had good runoff  into the foot and plantar arch. The overall thought was that he had enough adequate circulation to heal his venous ulcer and continue to use mild elevation and compression. Noted that he had biphasic posterior tibial signals with a Doppler ABI of 0.91. It was not felt that the stenosis in the proximal tibial artery would be associated with increased blood flow he will follow-up with Dr. Doren Custard in 1 month He now needs to get the skin surgery appointment set up. The area is growing larger and bleeding frequently on the posterior left calf per the patient 02/11/19-Patient is back after 1 week, results of his vascular studies are noted, patient needs to have a skin surgery appointment established apparently he did not return the calls this week. The area on the posterior calf is where the skin surgery needs to be done, the left lateral malleoli are wound appears to be about the same we will using Hydrofera Blue change 9/24; patient finally has a booking to deal with the cancer on his posterior calf I believe with the skin surgery center. Not much change in the wound over the left lateral malleolus. It is been 2 weeks since he was here. I changed him to endoform last time 10/2; the patient has been to see dermatology. Scheduled for surgery on 10/14. Using endoform to the wound over the lateral malleolus some improvement in the wound condition but not the surface area. He has severe chronic venous insufficiency. He also has some arterial disease he was angiogram by Dr. Doren Custard. He did not think that anything needed to be done from an intervention point of view. 10/8; his surgery is still scheduled for 10/14. It is concerning about whether he is healed or maintained skin integrity and is sutured area given the severity of his chronic venous insufficiency. The area we are following is on the left lateral malleolus. Some improvement in the surface area. No debridement today we have been using endoform 10/15;  the patient finally had his surgery for the tumor on his left posterior calf. We  did not look at this today. Still a punched out area in the lower left malleolus. Perhaps some mild improvement we have been using endoform We were denied for Grafix and still have not heard back on Oasis 10/22; no real change in this wound. I am able to debride it to a healthy looking surface but the next week there is recurrent debris and no improvement in depth. Punched out over the left lateral malleolus. We have not heard back on Oasis. We were denied for Grafix 11/6; wound is perhaps slightly smaller. Still punched out. He was approved for Oasis we applied Oasis #1. His surgical wound on the back of the left calf was reviewed by dermatology we are not reviewing this 11/13; I think the wound is filled in somewhat. Certainly a much more vibrant looking surface. Oasis #2. I looked at his surgical site on the back of the calf today for the first time. Clean incision small open areas 11/19; measurements not much different today. Still 0.4 cm in depth although post debridement the wound surface looks better. Oasis #3 the site on the back of his calf looks satisfactory. I put him in 4 layer compression last week he seems to tolerate this. He is complaining of some tenderness around the wound. This was controlled swelling around the wound. He seems to have tolerated this 04/28/2019 on evaluation today patient appears to be doing slightly better based on measurements compared to last week. He is here for Oasis application #4 to the ankle region. Fortunately there is no signs of other wounds open at this point and no signs of infection. 12/3; Oasis #5. We have a better looking wound surface but not much change in depth at 0.6 cm. 12/11; Oasis #6 again some improvement this week better looking surface. Perhaps some improvement in depth 12/17 Oasis number 7.3 cm in depth 05/26/2019 patient is here for Oasis application #8  in regard to his wound. He is showing evidence of some improvement right now with the Oasis. He did also go for his x-ray today although the results are not yet available in epic for review. 12/30- Patient returns at 1 week for Oasis application #9 with regard to his left lower extremity foot wound, which is showing some evidence of improvement, the x-ray that was done last week did show slight periosteal elevation and therefore he would be in line to get an MRI to confirm these findings Objective Constitutional alert and oriented x 3. sitting or standing blood pressure is within target range for patient.. supine blood pressure is within target range for patient.. pulse regular and within target range for patient.Marland Kitchen respirations regular, non-labored and within target range for patient.Marland Kitchen temperature within target range for patient.. Well-nourished and well-hydrated in no acute distress. Vitals Time Taken: 10:02 AM, Height: 73 in, Weight: 205 lbs, BMI: 27, Temperature: 97.8 F, Pulse: 75 bpm, Respiratory Rate: 18 breaths/min, Blood Pressure: 182/90 mmHg. General Notes: Left lower extremity foot ulcer, looking reasonably good, I debrided the base and the edges with a #3 curette, the Oasis #9 application was applied today Integumentary (Hair, Skin) Wound #4 status is Open. Original cause of wound was Trauma. The wound is located on the Left,Lateral Malleolus. The wound measures 0.8cm length x 0.8cm width x 0.3cm depth; 0.503cm^2 area and 0.151cm^3 volume. There is Fat Layer (Subcutaneous Tissue) Exposed exposed. There is no tunneling or undermining noted. There is a medium amount of serosanguineous drainage noted. The wound margin is well defined and not  attached to the wound base. There is medium (34-66%) pink granulation within the wound bed. There is a medium (34-66%) amount of necrotic tissue within the wound bed including Adherent Slough. 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 There was a Excisional Skin/Subcutaneous Tissue Debridement with a total area of 0.64 sq cm performed by Tobi Bastos, MD. With the following instrument(s): Curette to remove Viable and Non-Viable tissue/material. Material removed includes Subcutaneous Tissue, Slough, and Skin: Epidermis after achieving pain control using Lidocaine 4% Topical Solution. No specimens were taken. A time out was conducted at 10:18, prior to the start of the procedure. A Minimum amount of bleeding was controlled with Pressure. The procedure was tolerated well with a pain level of 4 throughout and a pain level of 2 following the procedure. Post Debridement Measurements: 0.8cm length x 0.8cm width x 0.3cm depth; 0.151cm^3 volume. Character of Wound/Ulcer Post Debridement is improved. Severity of Tissue Post Debridement is: Fat layer exposed. Post procedure Diagnosis Wound #4: Same as Pre-Procedure Pre-procedure diagnosis of Wound #4 is a Venous Leg Ulcer located on the Left,Lateral Malleolus. A skin graft procedure using a bioengineered skin substitute/cellular or tissue based product was performed by Tobi Bastos, MD with the following instrument(s): Forceps and Scissors. Oasis wound matrix was applied and secured with Steri-Strips. 4 sq cm of product was utilized and 6 sq cm was wasted due to wound size. Post Application, adaptic, gauze, foam donut was applied. A Time Out was conducted at 10:23, prior to the start of the procedure. The procedure was tolerated well with a pain level of 0 throughout and a pain level of 0  following the procedure. Post procedure Diagnosis Wound #4: Same as Pre-Procedure General Notes: oasis burn matrix. Pre-procedure diagnosis of Wound #4 is a Venous Leg Ulcer located on the Left,Lateral Malleolus . There was a Three Layer Compression Therapy Procedure by Carlene Coria, RN. Post procedure Diagnosis Wound #4: Same as Pre-Procedure Plan Follow-up Appointments: Return Appointment in 1 week. - Thursday Dressing Change Frequency: Wound #4 Left,Lateral Malleolus: Do not change entire dressing for one week. Skin Barriers/Peri-Wound Care: TCA Cream or Ointment - liberal to lower leg Wound Cleansing: Wound #4 Left,Lateral Malleolus: May shower with protection. Primary Wound Dressing: Wound #4 Left,Lateral Malleolus: Skin Substitute Application - Oasis Burn Matrix #9. secured with adaptic and steri-strips. leave in place. do not remove. Secondary Dressing: Wound #4 Left,Lateral Malleolus: Foam - foam donut. Dry Gauze Other: - pad the left lateral and anterior portion of lower leg. Edema Control: 3 Layer Compression System - Left Lower Extremity - no kerlix no cotton. pad with foam to anterior portion of lower leg. Avoid standing for long periods of time Elevate legs to the level of the heart or above for 30 minutes daily and/or when sitting, a frequency of: - throughout the day Exercise regularly Radiology ordered were: MRI, lower extremity with/without contast left ankle - nonhealing ulcer left lateral ankle CPT 1. Continue dressing the wound with nonadherent dressing and foam doughnut with dry gauze 2. To return next week evidently for Oasis #10, in the meantime will schedule MRI to rule out osteomyelitis Electronic Signature(s) Signed: 06/02/2019  5:10:58 PM By: Tobi Bastos MD, MBA Signed: 06/02/2019 5:40:33 PM By: Baruch Gouty RN, BSN Previous Signature: 06/02/2019 11:08:34 AM Version By: Tobi Bastos MD, MBA Entered By: Baruch Gouty on 06/02/2019  12:04:02 -------------------------------------------------------------------------------- SuperBill Details Patient Name: Date of Service: [name], [name] 06/02/2019 Medical Record [id number] Patient Account Number: [id number] Date of Birth/Sex: Treating RN: February 14, 1951 (68 y.o. 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, including hip Facility Procedures CPT4: Code [id number] I Description: [id number] - SKIN SUB GRAFT TRNK/ARM/LEG CD-10 Diagnosis Description L97.321 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

## 2019-06-03 ENCOUNTER — Other Ambulatory Visit: Payer: Self-pay | Admitting: Internal Medicine

## 2019-06-03 ENCOUNTER — Other Ambulatory Visit (HOSPITAL_COMMUNITY): Payer: Self-pay | Admitting: Internal Medicine

## 2019-06-03 DIAGNOSIS — L97321 Non-pressure chronic ulcer of left ankle limited to breakdown of skin: Secondary | ICD-10-CM

## 2019-06-03 NOTE — Progress Notes (Signed)
[name] ([id number]) Visit Report for 06/02/2019 Arrival Information Details Patient Name: Date of Service: [name], [name] 06/02/2019 9:45 AM Medical Record [id number] Patient Account Number: [id number] Date of Birth/Sex: Treating RN: Aug 26, 1950 (68 y.o. [name] Primary Care Provider: PATIENT, NO Other Clinician: Referring Provider: Treating Provider/Extender:Madduri, Johnsie Cancel, Designated Weeks in Treatment: 27 Visit Information History Since Last Visit Added or deleted any medications: No Patient Arrived: Cane Any new allergies or adverse reactions: No Arrival Time: 09:57 Had a fall or experienced change in No Accompanied By: alone activities of daily living that may affect Transfer Assistance: None risk of falls: Patient Identification Verified: Yes Signs or symptoms of abuse/neglect since last No Secondary Verification Process Yes visito Completed: Hospitalized since last visit: No Patient Requires Transmission- No Implantable device outside of the clinic excluding No Based Precautions: cellular tissue based products placed in the center Patient Has Alerts: Yes ABIs 08/2018 L1.05 since last visit: Patient Alerts: Has Dressing in Place as Prescribed: Yes R0.76 Has Compression in Place as Prescribed: Yes Pain Present Now: Yes Electronic Signature(s) Signed: 06/03/2019 3:10:57 PM By: [name] RN, BSN Entered By: [name] on 06/02/2019 09:59:00 -------------------------------------------------------------------------------- Compression Therapy Details Patient Name: Date of Service: [name], [name] 06/02/2019 9:45 AM Medical Record [id number] Patient Account Number: [id number] Date of Birth/Sex: Treating RN: [birth year] (68 y.o. [name] Primary Care Provider: PATIENT, NO Other Clinician: Referring Provider: Treating Provider/Extender:Madduri, Johnsie Cancel, Designated Weeks in Treatment:  27 Compression Therapy Performed for Wound Wound #4 Left,Lateral Malleolus Assessment: Performed By: Jake Church, RN Compression Type: Three Layer Post Procedure Diagnosis Same as Pre-procedure Electronic Signature(s) Signed: 06/02/2019 5:40:33 PM By: Baruch Gouty RN, BSN Entered By: Baruch Gouty on 06/02/2019 10:19:28 -------------------------------------------------------------------------------- Encounter Discharge Information Details Patient Name: Date of Service: [name], [name] 06/02/2019 9:45 AM Medical Record [id number] Patient Account Number: [id number] Date of Birth/Sex: Treating RN: [birth year] (68 y.o. [name]) Carlene Coria Primary Care Provider: PATIENT, NO Other Clinician: Referring Provider: Treating Provider/Extender:Madduri, Johnsie Cancel, Designated Weeks in Treatment: 27 Encounter Discharge Information Items Post Procedure Vitals Discharge Condition: Stable Temperature (F): 97.8 Ambulatory Status: Cane Pulse (bpm): 75 Discharge Destination: Home Respiratory Rate (breaths/min): 18 Transportation: Private Auto Blood Pressure (mmHg): 182/90 Accompanied By: self Schedule Follow-up Appointment: Yes Clinical Summary of Care: Patient Declined Electronic Signature(s) Signed: 06/02/2019 5:40:54 PM By: Carlene Coria RN Entered By: Carlene Coria on 06/02/2019 10:58:24 -------------------------------------------------------------------------------- Lower Extremity Assessment Details Patient Name: Date of Service: [name], [name] 06/02/2019 9:45 AM Medical Record [id number] Patient Account Number: [id number] Date of Birth/Sex: Treating RN: [birth year] (68 y.o. [name] Primary Care Provider: PATIENT, NO Other Clinician: Referring Provider: Treating Provider/Extender:Madduri, Johnsie Cancel, Designated Weeks in Treatment: 27 Edema Assessment Assessed: [Left: No] [Right: No] Edema: [Left: N] [Right: o] Calf Left: Right: Point of  Measurement: 35 cm From Medial Instep 34.8 cm cm Ankle Left: Right: Point of Measurement: 9 cm From Medial Instep 23.5 cm cm Vascular Assessment Pulses: Dorsalis Pedis Palpable: [Left:Yes] Electronic Signature(s) Signed: 06/03/2019 3:10:57 PM By: [name] RN, BSN Entered By: [name] on 06/02/2019 10:08:09 -------------------------------------------------------------------------------- Multi-Disciplinary Care Plan Details Patient Name: Date of Service: [name], [name] 06/02/2019 9:45 AM Medical Record [id number] Patient Account Number: [id number] Date of Birth/Sex: Treating RN: 1950/06/21 (68 y.o. [name] Primary Care Provider: PATIENT, NO Other Clinician: Referring Provider: Treating Provider/Extender:Madduri, Johnsie Cancel, Designated Weeks in Treatment: 27 Active Inactive Venous Leg Ulcer Nursing Diagnoses: Knowledge deficit related to disease process and management Potential for venous  Insuffiency (use before diagnosis confirmed) Goals: Patient will maintain optimal edema control Date Initiated: 11/20/2018 Target Resolution Date: 06/11/2019 Goal Status: Active Patient/caregiver will verbalize understanding of disease process and disease management Date Initiated: 11/20/2018 Date Inactivated: 12/17/2018 Target Resolution Date: 12/18/2018 Goal Status: Met Interventions: Assess peripheral edema status every visit. Compression as ordered Provide education on venous insufficiency Treatment Activities: Therapeutic compression applied : 11/20/2018 Notes: Electronic Signature(s) Signed: 06/02/2019 5:40:33 PM By: Baruch Gouty RN, BSN Entered By: Baruch Gouty on 06/02/2019 10:06:51 -------------------------------------------------------------------------------- Pain Assessment Details Patient Name: Date of Service: [name], [name] 06/02/2019 9:45 AM Medical Record [id number] Patient Account Number: [id number] Date of  Birth/Sex: Treating RN: 04/27/1951 (68 y.o. [name] Primary Care Indie Nickerson: PATIENT, NO Other Clinician: Referring Lesslie Mossa: Treating Tanylah Schnoebelen/Extender:Madduri, Johnsie Cancel, Designated Weeks in Treatment: 27 Active Problems Location of Pain Severity and Description of Pain Patient Has Paino Yes Site Locations Pain Location: Pain in Ulcers Rate the pain. Current Pain Level: 5 Character of Pain Describe the Pain: Throbbing Pain Management and Medication Current Pain Management: Medication: Yes Cold Application: No Rest: No Massage: No Activity: No T.E.N.S.: No Heat Application: No Leg drop or elevation: No Is the Current Pain Management Adequate: Adequate How does your wound impact your activities of daily livingo Sleep: No Bathing: No Appetite: No Relationship With Others: No Bladder Continence: No Emotions: No Bowel Continence: No Work: No Toileting: No Drive: No Dressing: No Hobbies: No Electronic Signature(s) Signed: 06/03/2019 3:10:57 PM By: [name] RN, BSN Entered By: [name] on 06/02/2019 09:59:41 -------------------------------------------------------------------------------- Patient/Caregiver Education Details [name] 12/30/2020andnbsp9:45 Patient Name: Date of Service: F. AM Medical Record Patient Account Number: [id number] Number: Treating RN: Baruch Gouty Date of Birth/Gender: December 21, 1950 (68 y.o. M) Other Clinician: Primary Care Physician: PATIENT, NO Treating Madduri, Murthy Referring Physician: Physician/Extender: None, Designated Weeks in Treatment: 34 Education Assessment Education Provided To: Patient Education Topics Provided Venous: Methods: Explain/Verbal Responses: Reinforcements needed, State content correctly Wound/Skin Impairment: Methods: Explain/Verbal Responses: Reinforcements needed, State content correctly Electronic Signature(s) Signed: 06/02/2019 5:40:33 PM By: Baruch Gouty RN, BSN Entered By: Baruch Gouty on 06/02/2019 10:07:13 -------------------------------------------------------------------------------- Wound Assessment Details Patient Name: Date of Service: [name], [name] 06/02/2019 9:45 AM Medical Record [id number] Patient Account Number: [id number] Date of Birth/Sex: Treating RN: [birth year] (68 y.o. [name] Primary Care Jamarie Mussa: PATIENT, NO Other Clinician: Referring Koreen Lizaola: Treating Sinclair Alligood/Extender:Madduri, Johnsie Cancel, Designated Weeks in Treatment: 27 Wound Status Wound Number: 4 Primary Venous Leg Ulcer Etiology: Etiology: Wound Location: Left Malleolus - Lateral Wound Open Wounding Event: Trauma Status: Date Acquired: 11/02/2018 Comorbid Arrhythmia, Coronary Artery Disease, Weeks Of Treatment: 27 History: Type II Diabetes Clustered Wound: No Photos Wound Measurements Length: (cm) 0.8 % Reduct Width: (cm) 0.8 % Reduct Depth: (cm) 0.3 Epitheli Area: (cm) 0.503 Tunneli Volume: (cm) 0.151 Undermi Wound Description Full Thickness Without Exposed Support Foul Od Classification: Structures Slough/ Wound Well defined, not attached Margin: Exudate Medium Amount: Exudate Serosanguineous Type: Exudate red, brown Color: Wound Bed Granulation Amount: Medium (34-66%) Granulation Quality: Pink Fascia E Necrotic Amount: Medium (34-66%) Fat Laye Necrotic Quality: Adherent Slough Tendon E Muscle E Joint Ex Bone Exp or After Cleansing: No Fibrino Yes Exposed Structure xposed: No r (Subcutaneous Tissue) Exposed: Yes xposed: No xposed: No posed: No osed: No ion in Area: 61.2% ion in Volume: -16.2% alization: Small (1-33%) ng: No ning: No Treatment Notes Wound #4 (Left, Lateral Malleolus) 1. Cleanse With Wound Cleanser Soap and water 2. Periwound Care Moisturizing lotion TCA Cream 3. Primary  Dressing Applied Dry Gauze 6. Support Layer Applied 3 layer compression wrap Notes oasis  burn applied by MD and covered with adaptic and secured with steristrips. no kerlix and no cotton. foam donut as secondary dressing. Electronic Signature(s) Signed: 06/03/2019 12:26:55 PM By: Mikeal Hawthorne EMT/HBOT Signed: 06/03/2019 3:10:57 PM By: [name] RN, BSN Entered By: Mikeal Hawthorne on 06/03/2019 11:46:19 -------------------------------------------------------------------------------- [place] Details Patient Name: Date of Service: [name], [name] 06/02/2019 9:45 AM Medical Record [id number] Patient Account Number: [id number] Date of Birth/Sex: Treating RN: Apr 23, 1951 (68 y.o. [name] Primary Care Vallorie Niccoli: PATIENT, NO Other Clinician: Referring Edouard Gikas: Treating Aujanae Mccullum/Extender:Madduri, Johnsie Cancel, Designated Weeks in Treatment: 27 Vital Signs Time Taken: 10:02 Temperature (F): 97.8 Height (in): 73 Pulse (bpm): 75 Weight (lbs): 205 Respiratory Rate (breaths/min): 18 Body Mass Index (BMI): 27 Blood Pressure (mmHg): 182/90 Reference Range: 80 - 120 mg / dl Electronic Signature(s) Signed: 06/03/2019 3:10:57 PM By: [name] RN, BSN Entered By: [name] on 06/02/2019 10:02:46

## 2019-06-10 ENCOUNTER — Encounter (HOSPITAL_BASED_OUTPATIENT_CLINIC_OR_DEPARTMENT_OTHER): Payer: Medicare Other | Admitting: Internal Medicine

## 2019-06-10 ENCOUNTER — Other Ambulatory Visit: Payer: Self-pay

## 2019-06-10 DIAGNOSIS — L97321 Non-pressure chronic ulcer of left ankle limited to breakdown of skin: Secondary | ICD-10-CM | POA: Diagnosis not present

## 2019-06-10 DIAGNOSIS — Y658 Other specified misadventures during surgical and medical care: Secondary | ICD-10-CM | POA: Diagnosis not present

## 2019-06-10 DIAGNOSIS — I87332 Chronic venous hypertension (idiopathic) with ulcer and inflammation of left lower extremity: Secondary | ICD-10-CM | POA: Insufficient documentation

## 2019-06-10 DIAGNOSIS — Z85828 Personal history of other malignant neoplasm of skin: Secondary | ICD-10-CM | POA: Diagnosis not present

## 2019-06-10 DIAGNOSIS — M86472 Chronic osteomyelitis with draining sinus, left ankle and foot: Secondary | ICD-10-CM | POA: Insufficient documentation

## 2019-06-10 DIAGNOSIS — S81812A Laceration without foreign body, left lower leg, initial encounter: Secondary | ICD-10-CM | POA: Insufficient documentation

## 2019-06-10 DIAGNOSIS — Y92531 Health care provider office as the place of occurrence of the external cause: Secondary | ICD-10-CM | POA: Diagnosis not present

## 2019-06-10 DIAGNOSIS — Y9389 Activity, other specified: Secondary | ICD-10-CM | POA: Diagnosis not present

## 2019-06-11 NOTE — Progress Notes (Signed)
[name], [name] ([id number]) Visit Report for 06/10/2019 Debridement Details Patient Name: Date of Service: [name], [name] 06/10/2019 9:30 AM Medical Record [id number] Patient Account Number: [id number] Date of Birth/Sex: Treating [name]: [birth year] (69 y.o. M) Primary Care Provider: PATIENT, NO Other Clinician: Referring Provider: Treating Provider/Extender:Uvaldo Rybacki, [name], Designated Weeks in Treatment: 28 Debridement Performed for Wound #4 Left,Lateral Malleolus Assessment: Performed By: Physician Ricard Dillon., MD Debridement Type: Debridement Severity of Tissue Pre Fat layer exposed Debridement: Level of Consciousness (Pre- Awake and Alert procedure): Pre-procedure Verification/Time Out Taken: Yes - 10:55 Start Time: 10:56 Pain Control: Lidocaine 4% Topical Solution Total Area Debrided (L x W): 1 (cm) x 1 (cm) = 1 (cm) Tissue and other material Viable, Non-Viable, Slough, Subcutaneous, Skin: Dermis , Fibrin/Exudate, Slough debrided: Level: Skin/Subcutaneous Tissue Debridement Description: Excisional Instrument: Curette Bleeding: Minimum Hemostasis Achieved: Pressure End Time: 11:00 Procedural Pain: 0 Post Procedural Pain: 0 Response to Treatment: Procedure was tolerated well Level of Consciousness Awake and Alert (Post-procedure): Post Debridement Measurements of Total Wound Length: (cm) 1.2 Width: (cm) 1.1 Depth: (cm) 0.5 Volume: (cm) 0.518 Character of Wound/Ulcer Post Requires Further Debridement Debridement: Severity of Tissue Post Debridement: Fat layer exposed Post Procedure Diagnosis Same as Pre-procedure Electronic Signature(s) Signed: 06/11/2019 4:55:01 AM By: Linton Ham MD Entered By: Linton Ham on 06/10/2019 11:11:56 -------------------------------------------------------------------------------- HPI Details Patient Name: Date of Service: Michael Post F. 06/10/2019 9:30 AM Medical Record [id number] Patient  Account Number: [id number] Date of Birth/Sex: Treating [name]: 1951-01-10 (69 y.o. M) Primary Care Provider: PATIENT, NO Other Clinician: Referring Provider: Treating Provider/Extender:Ermine Stebbins, [name], Designated Weeks in Treatment: 28 History of Present Illness HPI Description: 08/22/17-He is here for initial evaluation of a right heel wound, right toe wound and left heel fissures. He states that he has chronic dry, flaking skin and frequently has cracked heels. He states the right heel wound developed as such but he developed the wound approximately 3 weeks ago when pulling some skin off; he was unaware of the right toe ulcer. He was treating the right heel with over-the-counter triple antibiotic ointment and peroxide. He presented to urgent care on 3/12 with a 2 x 2 centimeter ulceration to the right heel and was discharged with a 10 day prescription for Bactrim. He does have a history of neuropathy, diet controlled diabetic. His does not know his most recent A1c but will be seeing his PCP next week, there is no evidence of an A1c in Epic. He denies smoking, does have a history of alcohol use, he admits to 5+/- beers weekly not daily. He did follow-up with cardiology on 3/18 with c/o ble edema, they ordered DVT study which he completed on 3/21. He reports that the bilateral DVT study was negative per the ultrasound technician, no report is available at this time. READMISSION 11/20/2018 this is a now 69 year old man who is here for review of wounds on his lower extremity on the left. We note that he was here on a single visit in March he tells [place] that over the last 2019. His major issue is that he dropped a box and hit the outside of his left lateral malleolus about 3 weeks ago. This is left him with a nonhealing area. He has been using Iodosorb ointment he had apparently from the last stay in this clinic. Also of note over the last 6 weeks he has noted an area that is raised and will scab  over but then he traumatizes a scab and then this bleeds and reopens.  This is on the left posterior calf. All of this appears to be on the background of chronic venous insufficiency. He has had DVT studies in the past that were negative in 2019. He is also had arterial studies that showed an ABI on the right of 0.76 and on the left of 1.05. Past medical history; the patient states that he is not a diabetic although I note there is some suggested he was in the note from last year. He has a history of coronary artery disease. He tells me that he has had a history of skin cancer but he is not sure which one but that includes one on the left upper thigh. ABIs done previously 0.76 on the right and 1.05 on the left 6/26; patient readmitted to the clinic last week. He had a traumatic area over the left lateral malleolus as well as a hyper granulated growth on the left posterior calf. He also has severe chronic venous insufficiency. 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 We have been using Iodoflex to the traumatic wound under compression. 7/9; area over the left lateral malleolus continues to require debridement and we are continuing with Iodoflex under compression. He has severe chronic venous insufficiency. 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 This patient has severe bilateral venous insufficiency and venous hypertension with skin changes resulting. He requires bilateral reflux studies Finally he traumatized his right lateral calf while doing yard work about a week ago. The area is small with a flap of skin over the majority of the wound but I am not sure this is going to remain viable 7/16; still not a viable surface on the left lateral malleolus in fact this wound is deeper. We have been using Iodoflex. 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 He has severe bilateral venous insufficiency and I have ordered reflux studies these apparently are booked for next week. Our intake nurse noted some yellowish-green drainage coming from the wound on the left ankle. Patient was insistent on talking about doing carotid ultrasounds and he was hoping to get these done with his reflux studies next week. I spent a few minutes talking to him about this I just could not put together in a logical way what he is concerned about. At the end of this I asked him to see his primary doctor about this he says he does not have a primary doctor advised him to get a primary doctor to go over this with him. It was not really clear where the level of concern was specific to carotid ultrasound 7/23; a better looking surface on the left lateral malleolus although it still requires debridement. He tells me he has an appointment to Mohs surgery center next week. Small area on the right lateral calf. The patient went to have his [facility] I do not think they took his compression wraps off. He did have reflux on the left in the common femoral greater saphenous and saphenofemoral junction and the great saphenous vein in the proximal thigh. 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 Looking at the numbers it would appear that the maximal reflux was in the great saphenous vein in the proximal thigh. As usual I am not sure what would be possible to do here. I will send him to vascular surgery 7/30; the patient still requires debridement in the left lateral malleolus although in general the wound is still deep and and punched out. We are using Sorbact. The area on the right  lateral calf is improved. 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 The patient does not have a vascular surgery appointment nor does he have a skin surgery center appointment. I am really not sure what the issues are here although I know vascular surgery as well behind. 8/6-Patient does not have a vascular appointment nor does he have a skin surgery appointment firmed up at this time although he says he is called them both, impressed upon him that he should see whoever is available for the vascular so the vein studies can be interpreted. 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 He is here for the left lateral malleoli wound for which we are using SOrbact, and he is disappointed the wound is not healing 8/21; patient saw Dr. Doren Custard on 01/21/2019. Dr. Doren Custard was concerned about his arterial flow. Noting that he had trouble feeling the dorsalis pedis and posterior tibial arteries also noteworthy that the Doppler had a barely biphasic posterior tibial signal with a monophasic but fairly brisk dorsalis pedis signal. He is booked for an angiogram next Friday. We have been using 3 layer compression on him. 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 Given Dr. Mee Hives concern it was difficult for me to be comfortable with him going ahead with the cancer extraction and I have suggested canceling this. Finally he has an increase in the erythematous contact dermatitis in the entire wrapped area of the  left leg which is probably an allergy to the contact layer of the 3 layer. 8/27; he is going for his angiogram tomorrow by Dr. Doren Custard. We cancel the Mohs surgery on the tumor on his posterior calf pending the angiogram tomorrow. If this shows adequate blood flow to this area I think we can rebook this. His main wound is on the left lateral malleolus a small refractory punched-out area. We have been using Hydrofera Blue 9/3; his angiogram was done as scheduled. He does indeed have PAD. The common femoral, deep femoral superficial femoral and popliteal arteries will are all widely patent. 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 The overall thought was that he had enough adequate circulation to heal his venous ulcer and continue to use mild elevation and compression. Noted that he had biphasic posterior tibial signals with a Doppler ABI of  0.91. It was not felt that the stenosis in the proximal tibial artery would be associated with increased blood flow he will follow-up with Dr. Doren Custard in 1 month He now needs to get the skin surgery appointment set up. The area is growing larger and bleeding frequently on the posterior left calf per the patient 02/11/19-Patient is back after 1 week, results of his vascular studies are noted, patient needs to have a skin surgery appointment established apparently he did not return the calls this week. The area on the posterior calf is where the skin surgery needs to be done, the left lateral malleoli are wound appears to be about the same we will using Hydrofera Blue change 9/24; patient finally has a booking to deal with the cancer on his posterior calf I believe with the skin surgery center. Not much change in the wound over the left lateral malleolus. It is been 2 weeks  since he was here. I changed him to endoform last time 10/2; the patient has been to see dermatology. Scheduled for surgery on 10/14. Using endoform to the wound over the lateral malleolus some improvement in the wound condition but not the surface area. He has severe chronic venous insufficiency. He also has some arterial disease he was angiogram by Dr. Doren Custard. He did not think that anything needed to be done from an intervention point of view. 10/8; his surgery is still scheduled for 10/14. It is concerning about whether he is healed or maintained skin integrity and is sutured area given the severity of his chronic venous insufficiency. The area we are following is on the left lateral malleolus. Some improvement in the surface area. No debridement today we have been using endoform 10/15; the patient finally had his surgery for the tumor on his left posterior calf. We did not look at this today. Still a punched out area in the lower left malleolus. Perhaps some mild improvement we have been using endoform We were denied for Grafix and still have not heard back on Oasis 10/22; no real change in this wound. I am able to debride it to a healthy looking surface but the next week there is recurrent debris and no improvement in depth. Punched out over the left lateral malleolus. We have not heard back on Oasis. We were denied for Grafix 11/6; wound is perhaps slightly smaller. Still punched out. He was approved for Oasis we applied Oasis #1. His surgical wound on the back of the left calf was reviewed by dermatology we are not reviewing this 11/13; I think the wound is filled in somewhat. Certainly a much more vibrant looking surface. Oasis #2. I looked at his surgical site on the back of the calf today for the first time. Clean incision small open areas 11/19; measurements not much different today. Still 0.4 cm in depth although post debridement the wound surface looks better. Oasis #3 the site on the  back of his calf looks satisfactory. I put him in 4 layer compression last week he seems to tolerate this. He is complaining of some tenderness around the wound. This was controlled swelling around the wound. He seems to have tolerated this 04/28/2019 on evaluation today patient appears to be doing slightly better based on measurements compared to last week. He is here for Oasis application #4 to the ankle region. Fortunately there is no signs of other wounds open at this point and no signs of infection. 12/3; Oasis #5. We have a better looking wound surface but  not much change in depth at 0.6 cm. 12/11; Oasis #6 again some improvement this week better looking surface. Perhaps some improvement in depth 12/17 Oasis number 7.3 cm in depth 05/26/2019 patient is here for Oasis application #8 in regard to his wound. He is showing evidence of some improvement right now with the Oasis. He did also go for his x-ray today although the results are not yet available in epic for review. 12/30- Patient returns at 1 week for Oasis application #9 with regard to his left lower extremity foot wound, which is showing some evidence of improvement, the x-ray that was done last week did show slight periosteal elevation and therefore he would be in line to get an MRI to confirm these findings 06/10/2019; the x-ray that the patient had done showed cortical irregularity and suggested an MRI. An MRI was ordered when he was here I believe on 12/23 but we still do not have an appointment for this. I did 9 Oasis on this man and really the improvement is quite dramatic however it is still not closed but certainly a lot better looking than say 5 to 6 weeks ago. The patient is fixated on Apligraf. I told him that I will not consider ordering this until the MRI is done. Today we put endoform on the wound Electronic Signature(s) Signed: 06/11/2019 4:55:01 AM By: Linton Ham MD Entered By: Linton Ham on 06/10/2019  11:13:31 -------------------------------------------------------------------------------- Physical Exam Details Patient Name: Date of Service: [name], [name] 06/10/2019 9:30 AM Medical Record [id number] Patient Account Number: [id number] Date of Birth/Sex: Treating [name]: 03/22/1951 (69 y.o. M) Primary Care Provider: PATIENT, NO Other Clinician: Referring Provider: Treating Provider/Extender:Keyosha Tiedt, [name], Designated Weeks in Treatment: 28 Constitutional Patient is hypertensive.. Pulse regular and within target range for patient.Marland Kitchen Respirations regular, non-labored and within target range.. Temperature is normal and within the target range for the patient.Marland Kitchen Appears in no distress. Notes Wound exam; left lower extremity wound over the lateral malleolus. This is come in quite a bit. I did still needed to debride this with a #3 curette removing adherent necrotic debris however the surface of this looks healthy. Hemostasis with direct pressure Electronic Signature(s) Signed: 06/11/2019 4:55:01 AM By: Linton Ham MD Entered By: Linton Ham on 06/10/2019 11:14:23 -------------------------------------------------------------------------------- Physician Orders Details Patient Name: Date of Service: [name], [name] 06/10/2019 9:30 AM Medical Record [id number] Patient Account Number: [id number] Date of Birth/Sex: Treating [name]: 08-Jun-1950 (69 y.o. [name] Primary Care Provider: PATIENT, NO Other Clinician: Referring Provider: Treating Provider/Extender:Jerene Yeager, [name], Designated Weeks in Treatment: 28 Verbal / Phone Orders: No Diagnosis Coding ICD-10 Coding Code Description I87.332 Chronic venous hypertension (idiopathic) with ulcer and inflammation of left lower extremity L97.321 Non-pressure chronic ulcer of left ankle limited to breakdown of skin C44.799 Other specified malignant neoplasm of skin of left lower limb, including hip Follow-up  Appointments Return Appointment in 1 week. - Thursday Dressing Change Frequency Wound #4 Left,Lateral Malleolus Do not change entire dressing for one week. Skin Barriers/Peri-Wound Care TCA Cream or Ointment - liberal to lower leg Wound Cleansing Wound #4 Left,Lateral Malleolus May shower with protection. Primary Wound Dressing Wound #4 Left,Lateral Malleolus Endoform - moisten with hydrogel cover with adaptic and steri-strips. Secondary Dressing Wound #4 Left,Lateral Malleolus Foam - foam donut. Dry Gauze Other: - pad the left lateral and anterior portion of lower leg. Edema Control 3 Layer Compression System - Left Lower Extremity - no kerlix no cotton. pad with foam to anterior portion of lower leg.  Avoid standing for long periods of time Elevate legs to the level of the heart or above for 30 minutes daily and/or when sitting, a frequency of: - throughout the day Exercise regularly Additional Orders / Instructions Other: - Patient to call MRI scheduler to schedule the MRI. Electronic Signature(s) Signed: 06/10/2019 5:19:45 PM By: [name] Signed: 06/11/2019 4:55:01 AM By: Linton Ham MD Entered By: [name] on 06/10/2019 11:03:27 -------------------------------------------------------------------------------- Problem List Details Patient Name: Date of Service: [name], [name] 06/10/2019 9:30 AM Medical Record [id number] Patient Account Number: [id number] Date of Birth/Sex: Treating [name]: 1951/01/12 (69 y.o. 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 limited to 11/20/2018  No Yes breakdown of skin C44.799 Other specified malignant neoplasm of skin of left 11/27/2018 No Yes lower limb, including hip Inactive Problems ICD-10 Code Description Active Date Inactive Date L97.211 Non-pressure chronic ulcer of right calf limited to breakdown of 12/10/2018 12/10/2018 skin Resolved Problems Electronic Signature(s) Signed: 06/11/2019 4:55:01 AM By: Linton Ham MD Entered By: Linton Ham on 06/10/2019 11:11:11 -------------------------------------------------------------------------------- Progress Note Details Patient Name: Date of Service: Michael [place]. 06/10/2019 9:30 AM Medical Record [id number] Patient Account Number: [id number] Date of Birth/Sex: Treating [name]: 20-May-1951 (69 y.o. M) Primary Care Provider: PATIENT, NO Other Clinician: Referring Provider: Treating Provider/Extender:Tahisha Hakim, [name], Designated Weeks in Treatment: 28 Subjective History of Present Illness (HPI) 08/22/17-He is here for initial evaluation of a right heel wound, right toe wound and left heel fissures. He states that he has chronic dry, flaking skin and frequently has cracked heels. He states the right heel wound developed as such but he developed the wound approximately 3 weeks ago when pulling some skin off; he was unaware of the right toe ulcer. He was treating the right heel with over-the-counter triple antibiotic ointment and peroxide. He presented to urgent care on 3/12 with a 2 x 2 centimeter ulceration to the right heel and was discharged with a 10 day prescription for Bactrim. He does have a history of neuropathy, diet controlled diabetic. His does not know his most recent A1c but will be seeing his PCP next week, there is no evidence of an A1c in Epic. He denies smoking, does have a history of alcohol use, he admits to 5+/- beers weekly not daily. He did follow-up with cardiology on 3/18 with c/o ble edema, they ordered DVT study which he completed on  3/21. He reports that the bilateral DVT study was negative per the ultrasound technician, no report is available at this time. READMISSION 11/20/2018 this is a now 69 year old man who is here for review of wounds on his lower extremity on the left. We note that he was here on a single visit in March he tells [place] that over the last 2019. His major issue is that he dropped a box and hit the outside of his left lateral malleolus about 3 weeks ago. This is left him with a nonhealing area. He has been using Iodosorb ointment he had apparently from the last stay in this clinic. Also of note over the last 6 weeks he has noted an area that is raised and will scab over but then he traumatizes a scab and then this bleeds and reopens. This is on  the left posterior calf. All of this appears to be on the background of chronic venous insufficiency. He has had DVT studies in the past that were negative in 2019. He is also had arterial studies that showed an ABI on the right of 0.76 and on the left of 1.05. Past medical history; the patient states that he is not a diabetic although I note there is some suggested he was in the note from last year. He has a history of coronary artery disease. He tells me that he has had a history of skin cancer but he is not sure which one but that includes one on the left upper thigh. ABIs done previously 0.76 on the right and 1.05 on the left 6/26; patient readmitted to the clinic last week. He had a traumatic area over the left lateral malleolus as well as a hyper granulated growth on the left posterior calf. He also has severe chronic venous insufficiency. 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 We have been using Iodoflex to the traumatic wound under compression. 7/9; area over the left lateral malleolus continues to require debridement and we are continuing with Iodoflex under compression. He has severe chronic venous  insufficiency. 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 This patient has severe bilateral venous insufficiency and venous hypertension with skin changes resulting. He requires bilateral reflux studies Finally he traumatized his right lateral calf while doing yard work about a week ago. The area is small with a flap of skin over the majority of the wound but I am not sure this is going to remain viable 7/16; still not a viable surface on the left lateral malleolus in fact this wound is deeper. We have been using Iodoflex. 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 He has severe bilateral venous insufficiency and I have ordered reflux studies these apparently are booked for next week. Our intake nurse noted some yellowish-green drainage coming from the wound on the left ankle. Patient was insistent on talking about doing carotid ultrasounds and he was hoping to get these done with his reflux studies next week. I spent a few minutes talking to him about this I just could not put together in a logical way what he is concerned about. At the end of this I asked him to see his primary doctor about this he says he does not have a primary doctor advised him to get a primary doctor to go over this with him. It was not really clear where the level of concern was specific to carotid ultrasound 7/23; a better looking surface on the left lateral malleolus although it still requires debridement. He tells me he has an appointment to [facility] surgery center next week. Small area on the right lateral calf. The patient went to [facility] I do not think they took his compression wraps off. He did have reflux on the left in the common femoral  greater saphenous and saphenofemoral junction and the great saphenous vein in the proximal thigh. 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 Looking at the numbers it would appear that the maximal reflux was in the great saphenous vein in the proximal thigh. As usual I am not sure what would be possible to do here. I will send him to vascular surgery 7/30; the patient still requires debridement in the left lateral malleolus although in general the wound is still deep and and punched out. We are using Sorbact. The area on the right lateral calf is improved. 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 The patient does not have a vascular surgery appointment nor does he have a skin surgery center appointment. I am really not sure what the issues are here although I know vascular surgery as well behind. 8/6-Patient does not have a vascular appointment nor does he have a skin surgery appointment firmed up at this time although he says he is called them both, impressed upon him that he should see whoever is available for the vascular so the vein studies can be interpreted. 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 He is here for the left lateral malleoli wound for which we are using SOrbact, and he is disappointed the wound is not healing 8/21; patient saw Dr. Doren Custard on 01/21/2019. Dr. Doren Custard was concerned about his arterial flow.  Noting that he had trouble feeling the dorsalis pedis and posterior tibial arteries also noteworthy that the Doppler had a barely biphasic posterior tibial signal with a monophasic but fairly brisk dorsalis pedis signal. He is booked for an angiogram next Friday. We have been using 3 layer compression on him. 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 Given Dr. Mee Hives concern it was difficult for me to be comfortable with him going ahead with the cancer extraction and I have suggested canceling this. Finally he has an increase in the erythematous contact dermatitis in the entire wrapped area of the left leg which is probably an allergy to the contact layer of the 3 layer. 8/27; he is going for his angiogram tomorrow by Dr. Doren Custard. We cancel the Mohs surgery on the tumor on his posterior calf pending the angiogram tomorrow. If this shows adequate blood flow to this area I think we can rebook this. His main wound is on the left lateral malleolus a small refractory punched-out area. We have been using Hydrofera Blue 9/3; his angiogram was done as scheduled. He does indeed have PAD. The common femoral, deep femoral superficial femoral and popliteal arteries will are all widely patent. 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 The overall thought was that he had enough adequate circulation to heal his venous ulcer and continue to use mild elevation and compression. Noted that he had biphasic posterior tibial signals with a Doppler ABI of 0.91. It was  not felt that the stenosis in the proximal tibial artery would be associated with increased blood flow he will follow-up with Dr. Doren Custard in 1 month He now needs to get the skin surgery appointment set up. The  area is growing larger and bleeding frequently on the posterior left calf per the patient 02/11/19-Patient is back after 1 week, results of his vascular studies are noted, patient needs to have a skin surgery appointment established apparently he did not return the calls this week. The area on the posterior calf is where the skin surgery needs to be done, the left lateral malleoli are wound appears to be about the same we will using Hydrofera Blue change 9/24; patient finally has a booking to deal with the cancer on his posterior calf I believe with the skin surgery center. Not much change in the wound over the left lateral malleolus. It is been 2 weeks since he was here. I changed him to endoform last time 10/2; the patient has been to see dermatology. Scheduled for surgery on 10/14. Using endoform to the wound over the lateral malleolus some improvement in the wound condition but not the surface area. He has severe chronic venous insufficiency. He also has some arterial disease he was angiogram by Dr. Doren Custard. He did not think that anything needed to be done from an intervention point of view. 10/8; his surgery is still scheduled for 10/14. It is concerning about whether he is healed or maintained skin integrity and is sutured area given the severity of his chronic venous insufficiency. The area we are following is on the left lateral malleolus. Some improvement in the surface area. No debridement today we have been using endoform 10/15; the patient finally had his surgery for the tumor on his left posterior calf. We did not look at this today. Still a punched out area in the lower left malleolus. Perhaps some mild improvement we have been using endoform We were denied for Grafix and still have not heard back on Oasis 10/22; no real change in this wound. I am able to debride it to a healthy looking surface but the next week there is recurrent debris and no improvement in depth. Punched out over  the left lateral malleolus. We have not heard back on Oasis. We were denied for Grafix 11/6; wound is perhaps slightly smaller. Still punched out. He was approved for Oasis we applied Oasis #1. His surgical wound on the back of the left calf was reviewed by dermatology we are not reviewing this 11/13; I think the wound is filled in somewhat. Certainly a much more vibrant looking surface. Oasis #2. I looked at his surgical site on the back of the calf today for the first time. Clean incision small open areas 11/19; measurements not much different today. Still 0.4 cm in depth although post debridement the wound surface looks better. Oasis #3 the site on the back of his calf looks satisfactory. I put him in 4 layer compression last week he seems to tolerate this. He is complaining of some tenderness around the wound. This was controlled swelling around the wound. He seems to have tolerated this 04/28/2019 on evaluation today patient appears to be doing slightly better based on measurements compared to last week. He is here for Oasis application #4 to the ankle region. Fortunately there is no signs of other wounds open at this point and no signs of infection. 12/3; Oasis #5. We have a better looking wound surface but not much change  in depth at 0.6 cm. 12/11; Oasis #6 again some improvement this week better looking surface. Perhaps some improvement in depth 12/17 Oasis number 7.3 cm in depth 05/26/2019 patient is here for Oasis application #8 in regard to his wound. He is showing evidence of some improvement right now with the Oasis. He did also go for his x-ray today although the results are not yet available in epic for review. 12/30- Patient returns at 1 week for Oasis application #9 with regard to his left lower extremity foot wound, which is showing some evidence of improvement, the x-ray that was done last week did show slight periosteal elevation and therefore he would be in line to get an  MRI to confirm these findings 06/10/2019; the x-ray that the patient had done showed cortical irregularity and suggested an MRI. An MRI was ordered when he was here I believe on 12/23 but we still do not have an appointment for this. I did 9 Oasis on this man and really the improvement is quite dramatic however it is still not closed but certainly a lot better looking than say 5 to 6 weeks ago. The patient is fixated on Apligraf. I told him that I will not consider ordering this until the MRI is done. Today we put endoform on the wound Objective Constitutional Patient is hypertensive.. Pulse regular and within target range for patient.Marland Kitchen Respirations regular, non-labored and within target range.. Temperature is normal and within the target range for the patient.Marland Kitchen Appears in no distress. Vitals Time Taken: 10:00 AM, Height: 73 in, Weight: 205 lbs, BMI: 27, Temperature: 98.2 F, Pulse: 99 bpm, Respiratory Rate: 19 breaths/min, Blood Pressure: 141/96 mmHg. General Notes: Wound exam; left lower extremity wound over the lateral malleolus. This is come in quite a bit. I did still needed to debride this with a #3 curette removing adherent necrotic debris however the surface of this looks healthy. Hemostasis with direct pressure Integumentary (Hair, Skin) Wound #4 status is Open. Original cause of wound was Trauma. The wound is located on the Left,Lateral Malleolus. The wound measures 1.2cm length x 1.1cm width x 0.5cm depth; 1.037cm^2 area and 0.518cm^3 volume. There is Fat Layer (Subcutaneous Tissue) Exposed exposed. There is no tunneling or undermining noted. There is a medium amount of serous drainage noted. The wound margin is well defined and not attached to the wound base. There is medium (34-66%) pink granulation within the wound bed. There is a medium (34-66%) amount of necrotic tissue within the wound bed including Adherent Slough. 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 There was a Excisional Skin/Subcutaneous Tissue Debridement with a total area of 1 sq cm performed by Ricard Dillon., MD. With the following instrument(s): Curette to remove Viable and Non-Viable tissue/material. Material removed includes Subcutaneous Tissue, Slough, Skin: Dermis, and Fibrin/Exudate after achieving pain control using Lidocaine 4% Topical Solution. A time out was conducted at 10:55, prior to the start of the procedure. A Minimum amount of bleeding was controlled with Pressure. The procedure was tolerated well with a pain level of 0 throughout and a pain level of 0 following the procedure. Post Debridement Measurements: 1.2cm length x  1.1cm width x 0.5cm depth; 0.518cm^3 volume. Character of Wound/Ulcer Post Debridement requires further debridement. Severity of Tissue Post Debridement is: Fat layer exposed. Post procedure Diagnosis Wound #4: Same as Pre-Procedure Pre-procedure diagnosis of Wound #4 is a Venous Leg Ulcer located on the Left,Lateral Malleolus . There was a Three Layer Compression Therapy Procedure by Michael Gouty, [name]. Post procedure Diagnosis Wound #4: Same as Pre-Procedure Plan Follow-up Appointments: Return Appointment in 1 week. - Thursday Dressing Change Frequency: Wound #4 Left,Lateral Malleolus: Do not change entire dressing for one week. Skin Barriers/Peri-Wound Care: TCA Cream or Ointment - liberal to lower leg Wound Cleansing: Wound #4 Left,Lateral Malleolus: May shower with protection. Primary Wound Dressing: Wound #4 Left,Lateral  Malleolus: Endoform - moisten with hydrogel cover with adaptic and steri-strips. Secondary Dressing: Wound #4 Left,Lateral Malleolus: Foam - foam donut. Dry Gauze Other: - pad the left lateral and anterior portion of lower leg. Edema Control: 3 Layer Compression System - Left Lower Extremity - no kerlix no cotton. pad with foam to anterior portion of lower leg. Avoid standing for long periods of time Elevate legs to the level of the heart or above for 30 minutes daily and/or when sitting, a frequency of: - throughout the day Exercise regularly Additional Orders / Instructions: Other: - Patient to call MRI scheduler to schedule the MRI. 1. We used endoform today 2. Await MRI before ordering any other advanced treatment products. Electronic Signature(s) Signed: 06/11/2019 4:55:01 AM By: Linton Ham MD Entered By: Linton Ham on 06/10/2019 11:15:34 -------------------------------------------------------------------------------- SuperBill Details Patient Name: Date of Service: KIREN, HAVRON 06/10/2019 Medical Record [id number] Patient Account Number: [id number] Date of Birth/Sex: Treating [name]: [birth year] (69 y.o. 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, including hip Facility Procedures CPT4 Code Description: [id number] - DEB SUBQ TISSUE 20 SQ CM/< ICD-10 Diagnosis Description L97.321 Non-pressure chronic ulcer of left ankle limited to breakdo I87.332 Chronic venous hypertension (idiopathic) with ulcer and inf extremity Modifier: wn of skin  lammation of le Quantity: 1 ft lower Physician Procedures CPT4 Code Description: [id number] - WC PHYS SUBQ TISS 20 SQ CM ICD-10 Diagnosis Description L97.321 Non-pressure chronic ulcer of left ankle limited to breakdo I87.332 Chronic venous hypertension (idiopathic) with ulcer and inf extremity Modifier: wn of skin lammation of lef Quantity: 1 t lower Electronic Signature(s) Signed: 06/11/2019 4:55:01 AM By: Linton Ham MD Entered By: Linton Ham on 06/10/2019 11:15:51

## 2019-06-11 NOTE — Progress Notes (Addendum)
[name] ([id number]) Visit Report for 06/10/2019 Arrival Information Details Patient Name: Date of Service: [name], [name] 06/10/2019 9:30 AM Medical Record [id number] Patient Account Number: [id number] Date of Birth/Sex: Treating RN: [birth year] (69 y.o. [name] Primary Care Doral Digangi: PATIENT, NO Other Clinician: Referring Kamirah Shugrue: Treating Almarosa Bohac/Extender:Robson, [name], Designated Weeks in Treatment: 28 Visit Information History Since Last Visit Added or deleted any medications: No Patient Arrived: [name] Any new allergies or adverse reactions: No Arrival Time: 10:01 Had a fall or experienced change in No Accompanied By: self activities of daily living that may affect Transfer Assistance: None risk of falls: Patient Identification Verified: Yes Signs or symptoms of abuse/neglect since last No Secondary Verification Process Yes visito Completed: Hospitalized since last visit: No Patient Requires Transmission- No Implantable device outside of the clinic excluding No Based Precautions: cellular tissue based products placed in the center Patient Has Alerts: Yes ABIs 08/2018 L1.05 since last visit: Patient Alerts: Has Dressing in Place as Prescribed: Yes R0.76 Has Compression in Place as Prescribed: Yes Pain Present Now: No Electronic Signature(s) Signed: 06/10/2019 5:21:35 PM By: [name] Entered By: [name] on 06/10/2019 10:02:04 -------------------------------------------------------------------------------- Compression Therapy Details Patient Name: Date of Service: [name] 06/10/2019 9:30 AM Medical Record [id number] Patient Account Number: [id number] Date of Birth/Sex: Treating RN: [birth year] (69 y.o. [name] Primary Care Samuella Rasool: PATIENT, NO Other Clinician: Referring Concepcion Gillott: Treating Naveen Lorusso/Extender:Robson, [name], Designated Weeks in Treatment: 28 Compression Therapy  Performed for Wound Wound #4 Left,Lateral Malleolus Assessment: Performed By: Clinician Baruch Gouty, RN Compression Type: Three Layer Post Procedure Diagnosis Same as Pre-procedure Electronic Signature(s) Signed: 06/10/2019 5:19:45 PM By: [name] Entered By: [name] on 06/10/2019 11:03:55 -------------------------------------------------------------------------------- Encounter Discharge Information Details Patient Name: Date of Service: [name] 06/10/2019 9:30 AM Medical Record [id number] Patient Account Number: [id number] Date of Birth/Sex: Treating RN: 1951-05-09 (69 y.o. [name] Primary Care Charon Smedberg: PATIENT, NO Other Clinician: Referring Thoren Hosang: Treating Sanye Ledesma/Extender:Robson, [name], Designated Weeks in Treatment: 28 Encounter Discharge Information Items Post Procedure Vitals Discharge Condition: Stable Temperature (F): 98.2 Ambulatory Status: Cane Pulse (bpm): 99 Discharge Destination: Home Respiratory Rate (breaths/min): 18 Transportation: Private Auto Blood Pressure (mmHg): 141/96 Accompanied By: self Schedule Follow-up Appointment: Yes Clinical Summary of Care: Patient Declined Electronic Signature(s) Signed: 06/10/2019 5:27:53 PM By: Baruch Gouty RN, BSN Entered By: Baruch Gouty on 06/10/2019 11:36:52 -------------------------------------------------------------------------------- Lower Extremity Assessment Details Patient Name: Date of Service: [name] 06/10/2019 9:30 AM Medical Record [id number] Patient Account Number: [id number] Date of Birth/Sex: Treating RN: 09/15/1950 (69 y.o. [name] Primary Care Onetta Spainhower: PATIENT, NO Other Clinician: Referring Wayman Hoard: Treating Zeddie Njie/Extender:Robson, [name], Designated Weeks in Treatment: 28 Edema Assessment Assessed: [Left: No] [Right: No] Edema: [Left: N] [Right: o] Calf Left: Right: Point of Measurement: 35 cm From  Medial Instep 32.5 cm cm Ankle Left: Right: Point of Measurement: 9 cm From Medial Instep 23 cm cm Vascular Assessment Pulses: Dorsalis Pedis Palpable: [Left:Yes] Electronic Signature(s) Signed: 06/10/2019 5:21:35 PM By: [name] Entered By: [name] on 06/10/2019 10:09:55 -------------------------------------------------------------------------------- Multi Wound Chart Details Patient Name: Date of Service: [name]. 06/10/2019 9:30 AM Medical Record [id number] Patient Account Number: [id number] Date of Birth/Sex: Treating RN: [birth year] (69 y.o. M) Primary Care Ma Munoz: PATIENT, NO Other Clinician: Referring Adel Neyer: Treating Jahden Schara/Extender:Robson, [name], Designated Weeks in Treatment: 28 Vital Signs Height(in): 73 Pulse(bpm): 99 Weight(lbs): 205 Blood Pressure(mmHg): 141/96 Body Mass Index(BMI): 27 Temperature(F): 98.2 Respiratory 19 Rate(breaths/min): Photos: [4:No Photos] [N/A:N/A] Wound  Location: [4:Left Malleolus - Lateral] [N/A:N/A] Wounding Event: [4:Trauma] [N/A:N/A] Primary Etiology: [4:Venous Leg Ulcer] [N/A:N/A] Comorbid History: [4:Arrhythmia, Coronary Artery N/A Disease, Type II Diabetes] Date Acquired: [4:11/02/2018] [N/A:N/A] Weeks of Treatment: [4:28] [N/A:N/A] Wound Status: [4:Open] [N/A:N/A] Measurements L x W x D 1.2x1.1x0.5 [N/A:N/A] (cm) Area (cm) : [4:1.037] [N/A:N/A] Volume (cm) : [4:0.518] [N/A:N/A] % Reduction in Area: [4:20.00%] [N/A:N/A] % Reduction in Volume: -298.50% [N/A:N/A] Classification: [4:Full Thickness Without Exposed Support Structures] [N/A:N/A] Exudate Amount: [4:Medium] [N/A:N/A] Exudate Type: [4:Serous] [N/A:N/A] Exudate Color: [4:amber] [N/A:N/A] Wound Margin: [4:Well defined, not attached N/A] Granulation Amount: [4:Medium (34-66%)] [N/A:N/A] Granulation Quality: [4:Pink] [N/A:N/A] Necrotic Amount: [4:Medium (34-66%)] [N/A:N/A] Exposed Structures: [4:Fat Layer  (Subcutaneous N/A Tissue) Exposed: Yes Fascia: No Tendon: No Muscle: No Joint: No Bone: No] Epithelialization: [4:Small (1-33%)] [N/A:N/A] Debridement: [4:Debridement - Excisional N/A] Pre-procedure [4:10:55] [N/A:N/A] Verification/Time Out Taken: Pain Control: [4:Lidocaine 4% Topical Solution] [N/A:N/A] Tissue Debrided: [4:Subcutaneous, Slough] [N/A:N/A] Level: [4:Skin/Subcutaneous Tissue] [N/A:N/A] Debridement Area (sq cm):1 [N/A:N/A] Instrument: [4:Curette] [N/A:N/A] Bleeding: [4:Minimum] [N/A:N/A] Hemostasis Achieved: [4:Pressure] [N/A:N/A] Procedural Pain: [4:0] [N/A:N/A] Post Procedural Pain: [4:0] [N/A:N/A] Debridement Treatment Procedure was tolerated [N/A:N/A] Response: [4:well] Post Debridement [4:1.2x1.1x0.5] [N/A:N/A] Measurements L x W x D (cm) Post Debridement [4:0.518] [N/A:N/A] Volume: (cm) Procedures Performed: Compression Therapy [4:Debridement] [N/A:N/A] Treatment Notes Electronic Signature(s) Signed: 06/11/2019 4:55:01 AM By: Linton Ham MD Entered By: Linton Ham on 06/10/2019 11:11:45 -------------------------------------------------------------------------------- Multi-Disciplinary Care Plan Details Patient Name: Date of Service: [name] 06/10/2019 9:30 AM Medical Record [id number] Patient Account Number: [id number] Date of Birth/Sex: Treating RN: May 02, 1951 (69 y.o. [name] Primary Care Justise Ehmann: PATIENT, NO Other Clinician: Referring Adanya Sosinski: Treating Makira Holleman/Extender:Robson, [name], Designated Weeks in Treatment: 28 Active Inactive Venous Leg Ulcer Nursing Diagnoses: Knowledge deficit related to disease process and management Potential for venous Insuffiency (use before diagnosis confirmed) Goals: Patient will maintain optimal edema control Date Initiated: 11/20/2018 Target Resolution Date: 07/02/2019 Goal Status: Active Patient/caregiver will verbalize understanding of disease process and disease  management Date Initiated: 11/20/2018 Date Inactivated: 12/17/2018 Target Resolution Date: 12/18/2018 Goal Status: Met Interventions: Assess peripheral edema status every visit. Compression as ordered Provide education on venous insufficiency Treatment Activities: Therapeutic compression applied : 11/20/2018 Notes: Electronic Signature(s) Signed: 06/10/2019 5:19:45 PM By: [name] Entered By: [name] on 06/10/2019 10:48:12 -------------------------------------------------------------------------------- Pain Assessment Details Patient Name: Date of Service: WYATT, THORSTENSON 06/10/2019 9:30 AM Medical Record [id number] Patient Account Number: [id number] Date of Birth/Sex: Treating RN: 21-Jan-1951 (69 y.o. [name] Primary Care Meilah Delrosario: PATIENT, NO Other Clinician: Referring Jillyn Stacey: Treating Lanora Reveron/Extender:Robson, [name], Designated Weeks in Treatment: 28 Active Problems Location of Pain Severity and Description of Pain Patient Has Paino No Site Locations Pain Management and Medication Current Pain Management: Electronic Signature(s) Signed: 06/10/2019 5:21:35 PM By: [name] Entered By: [name] on 06/10/2019 10:05:22 -------------------------------------------------------------------------------- Patient/Caregiver Education Details Patient Name: Date of Service: [name] 1/7/2021andnbsp9:30 AM Medical Record [id number] Patient Account Number: [id number] Date of Birth/Gender: Treating RN: July 12, 1950 (68 y.o. [name] Primary Care Physician: PATIENT, NO Other Clinician: Referring Physician: Treating Physician/Extender:Robson, [name], Designated Weeks in Treatment: 28 Education Assessment Education Provided To: Patient Education Topics Provided Venous: Handouts: Managing Venous Disease and Related Ulcers Methods: Explain/Verbal Responses: Reinforcements needed Electronic  Signature(s) Signed: 06/10/2019 5:19:45 PM By: [name] Entered By: [name] on 06/10/2019 10:48:27 -------------------------------------------------------------------------------- Wound Assessment Details Patient Name: Date of Service: ELERY, CADENHEAD 06/10/2019 9:30 AM Medical Record [id number] Patient Account Number: [id number] Date of Birth/Sex: Treating RN: [birth year] (68 y.o.  [name] Primary Care Michaelle Bottomley: PATIENT, NO Other Clinician: Referring Maritssa Haughton: Treating Shakhia Gramajo/Extender:Robson, [name], Designated Weeks in Treatment: 28 Wound Status Wound Number: 4 Primary Venous Leg Ulcer Etiology: Wound Location: Left Malleolus - Lateral Wound Open Wounding Event: Trauma Status: Date Acquired: 11/02/2018 Comorbid Arrhythmia, Coronary Artery Disease, Weeks Of Treatment: 28 History: Type II Diabetes Clustered Wound: No Photos Wound Measurements Length: (cm) 1.2 % Reduct Width: (cm) 1.1 % Reduct Depth: (cm) 0.5 Epitheli Area: (cm) 1.037 Tunneli Volume: (cm) 0.518 Undermi Wound Description Classification: Full Thickness Without Exposed Support Foul Odo Structures Slough/F Wound Well defined, not attached Margin: Exudate Medium Amount: Exudate Serous Type: Exudate amber Color: Wound Bed Granulation Amount: Medium (34-66%) Granulation Quality: Pink Fascia E Necrotic Amount: Medium (34-66%) Fat Laye Necrotic Quality: Adherent Slough Tendon E Muscle E Joint Ex Bone Exp Treatment Notes Wound #4 (Left, Lateral Malleolus) 2. Periwound Care Moisturizing lotion TCA Cream 3. Primary Dressing Applied Endoform Hydrogel or K-Y Jelly Other primary dressing (specifiy in notes) 4. Secondary Dressing Dry Gauze Foam 6. Support Layer Applied 4 layer compression wrap Notes adaptic Electronic Signature(s) Signed: 06/11/2019 2:58:22 PM By: [name] Signed: 06/11/2019 3:20:21 PM By: Mikeal Hawthorne EMT/HBOT Previous Signature:  06/10/2019 5:21:35 PM Version By: Brigid Re Entered By: Mikeal Hawthorne on 01/0 r After Cleansing: No ibrino Yes Exposed Structure xposed: No r (Subcutaneous Tissue) Exposed: Yes xposed: No xposed: No posed: No osed: No nnon 01/2020 11:28:29 ion in Area: 20% ion in Volume: -298.5% alization: Small (1-33%) ng: No ning: No -------------------------------------------------------------------------------- Vitals Details Patient Name: Date of Service: IKECHUKWU, CERNY 06/10/2019 9:30 AM Medical Record [id number] Patient Account Number: [id number] Date of Birth/Sex: Treating RN: [birth year] (69 y.o. [name] Primary Care Hannan Tetzlaff: PATIENT, NO Other Clinician: Referring Tramane Gorum: Treating Redford Behrle/Extender:Robson, [name], Designated Weeks in Treatment: 28 Vital Signs Time Taken: 10:00 Temperature (F): 98.2 Height (in): 73 Pulse (bpm): 99 Weight (lbs): 205 Respiratory Rate (breaths/min): 19 Body Mass Index (BMI): 27 Blood Pressure (mmHg): 141/96 Reference Range: 80 - 120 mg / dl Electronic Signature(s) Signed: 06/10/2019 5:21:35 PM By: [name] Entered By: [name] on 06/10/2019 10:05:15

## 2019-06-14 ENCOUNTER — Ambulatory Visit (HOSPITAL_COMMUNITY): Admission: RE | Admit: 2019-06-14 | Payer: Medicare Other | Source: Ambulatory Visit

## 2019-06-15 ENCOUNTER — Other Ambulatory Visit: Payer: Self-pay

## 2019-06-15 ENCOUNTER — Ambulatory Visit (HOSPITAL_COMMUNITY)
Admission: RE | Admit: 2019-06-15 | Discharge: 2019-06-15 | Disposition: A | Discharge status: Home / Self Care | Payer: Medicare Other | Source: Ambulatory Visit | Attending: Internal Medicine | Admitting: Internal Medicine

## 2019-06-15 DIAGNOSIS — L97321 Non-pressure chronic ulcer of left ankle limited to breakdown of skin: Secondary | ICD-10-CM | POA: Insufficient documentation

## 2019-06-15 LAB — POCT I-STAT CREATININE: Creatinine, Ser: 0.7 mg/dL (ref 0.61–1.24)

## 2019-06-15 MED ORDER — GADOBUTROL 1 MMOL/ML IV SOLN
9.0000 mL | Freq: Once | INTRAVENOUS | Status: AC | PRN
  Administered 2019-06-15: 9 mL via INTRAVENOUS

## 2019-06-17 ENCOUNTER — Other Ambulatory Visit: Payer: Self-pay | Admitting: Physician Assistant

## 2019-06-17 ENCOUNTER — Encounter (HOSPITAL_BASED_OUTPATIENT_CLINIC_OR_DEPARTMENT_OTHER): Payer: Medicare Other | Admitting: Internal Medicine

## 2019-06-18 ENCOUNTER — Encounter (HOSPITAL_BASED_OUTPATIENT_CLINIC_OR_DEPARTMENT_OTHER): Payer: Medicare Other | Admitting: Internal Medicine

## 2019-06-18 ENCOUNTER — Other Ambulatory Visit: Payer: Self-pay

## 2019-06-18 DIAGNOSIS — I87332 Chronic venous hypertension (idiopathic) with ulcer and inflammation of left lower extremity: Secondary | ICD-10-CM | POA: Diagnosis not present

## 2019-06-18 NOTE — Progress Notes (Signed)
MONDO, ARAKAKI ([id number]) Visit Report for 06/18/2019 Debridement Details Patient Name: [name], [name]. Date of Service: 06/18/2019 11:15 AM Medical Record [id number] Patient Account Number: [id number] Date of Birth/Sex: 23-Nov-1950 (69 y.o. M) Treating RN: Primary Care Provider: PATIENT, NO Other Clinician: Referring Provider: Treating Provider/Extender:Delorus Langwell, [name], Designated Weeks in Treatment: 30 Debridement Performed for Wound #4 Left,Lateral Malleolus Assessment: Performed By: Physician Ricard Dillon., MD Debridement Type: Debridement Severity of Tissue Pre Fat layer exposed Debridement: Level of Consciousness (Pre- Awake and Alert procedure): Pre-procedure Verification/Time Out Taken: Yes - 12:45 Start Time: 12:45 Pain Control: Other : Benzocaine 20% Total Area Debrided (L x W): 0.7 (cm) x 0.8 (cm) = 0.56 (cm) Tissue and other material Viable, Non-Viable, Slough, Subcutaneous, Slough debrided: Level: Skin/Subcutaneous Tissue Debridement Description: Excisional Instrument: Curette Bleeding: Minimum Hemostasis Achieved: Pressure End Time: 12:47 Procedural Pain: 1 Post Procedural Pain: 0 Response to Treatment: Procedure was tolerated well Level of Consciousness Awake and Alert (Post-procedure): Post Debridement Measurements of Total Wound Length: (cm) 0.7 Width: (cm) 0.8 Depth: (cm) 0.4 Volume: (cm) 0.176 Character of Wound/Ulcer Post Improved Debridement: Severity of Tissue Post Debridement: Fat layer exposed Post Procedure Diagnosis Same as Pre-procedure Electronic Signature(s) Signed: 06/18/2019 6:20:19 PM By: Linton Ham MD Entered By: Linton Ham on 06/18/2019 13:22:21 -------------------------------------------------------------------------------- HPI Details Patient Name: Date of Service: Michael Post F. 06/18/2019 11:15 AM Medical Record [id number] Patient Account Number: [id number] Date of  Birth/Sex: Treating RN: [birth year] (69 y.o. M) Primary Care Provider: PATIENT, NO Other Clinician: Referring Provider: Treating Provider/Extender:Avrianna Smart, [name], Designated Weeks in Treatment: 30 History of Present Illness HPI Description: 08/22/17-He is here for initial evaluation of a right heel wound, right toe wound and left heel fissures. He states that he has chronic dry, flaking skin and frequently has cracked heels. He states the right heel wound developed as such but he developed the wound approximately 3 weeks ago when pulling some skin off; he was unaware of the right toe ulcer. He was treating the right heel with over-the-counter triple antibiotic ointment and peroxide. He presented to urgent care on 3/12 with a 2 x 2 centimeter ulceration to the right heel and was discharged with a 10 day prescription for Bactrim. He does have a history of neuropathy, diet controlled diabetic. His does not know his most recent A1c but will be seeing his PCP next week, there is no evidence of an A1c in Epic. He denies smoking, does have a history of alcohol use, he admits to 5+/- beers weekly not daily. He did follow-up with cardiology on 3/18 with c/o ble edema, they ordered DVT study which he completed on 3/21. He reports that the bilateral DVT study was negative per the ultrasound technician, no report is available at this time. READMISSION 11/20/2018 this is a now 69 year old man who is here for review of wounds on his lower extremity on the left. We note that he was here on a single visit in March he tells [place] that over the last 2019. His major issue is that he dropped a box and hit the outside of his left lateral malleolus about 3 weeks ago. This is left him with a nonhealing area. He has been using Iodosorb ointment he had apparently from the last stay in this clinic. Also of note over the last 6 weeks he has noted an area that is raised and will scab over but then he traumatizes a scab  and then this bleeds and reopens. This is on the left posterior  calf. All of this appears to be on the background of chronic venous insufficiency. He has had DVT studies in the past that were negative in 2019. He is also had arterial studies that showed an ABI on the right of 0.76 and on the left of 1.05. Past medical history; the patient states that he is not a diabetic although I note there is some suggested he was in the note from last year. He has a history of coronary artery disease. He tells me that he has had a history of skin cancer but he is not sure which one but that includes one on the left upper thigh. ABIs done previously 0.76 on the right and 1.05 on the left 6/26; patient readmitted to the clinic last week. He had a traumatic area over the left lateral malleolus as well as a hyper granulated growth on the left posterior calf. He also has severe chronic venous insufficiency. 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 We have been using Iodoflex to the traumatic wound under compression. 7/9; area over the left lateral malleolus continues to require debridement and we are continuing with Iodoflex under compression. He has severe chronic venous insufficiency. 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 This patient has severe bilateral venous insufficiency and venous hypertension with skin changes resulting. He requires bilateral reflux studies Finally he traumatized his right lateral calf while doing yard work about a week ago. The area is small with a flap of skin over the majority of the wound but I am not sure this is going to remain viable 7/16; still not a viable surface on the left lateral malleolus in fact this wound is deeper. We have been using Iodoflex. 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 He has severe bilateral venous insufficiency and I have ordered reflux studies these apparently are booked for next week. Our intake nurse noted some yellowish-green drainage coming from the wound on the left ankle. Patient was insistent on talking about doing carotid ultrasounds and he was hoping to get these done with his reflux studies next week. I spent a few minutes talking to him about this I just could not put together in a logical way what he is concerned about. At the end of this I asked him to see his primary doctor about this he says he does not have a primary doctor advised him to get a primary doctor to go over this with him. It was not really clear where the level of concern was specific to carotid ultrasound 7/23; a better looking surface on the left lateral malleolus although it still requires debridement. He tells me he has an appointment to Mohs surgery center next week. Small area on the right lateral calf. The patient went to have his [facility] I do not think they took his compression wraps off. He did have reflux on the left in the common femoral greater saphenous and saphenofemoral junction and the great saphenous vein in the proximal thigh. 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 Looking at the numbers it would appear that the maximal reflux was in the great saphenous vein in the proximal thigh. As usual I am not sure what would be possible to do here. I will send him to vascular surgery 7/30; the patient still requires debridement in the left lateral malleolus although in general the wound is still deep and and punched out. We are using Sorbact. The area on the right lateral calf is improved. He still  has hyper granulated tumor on the left posterior calf. The patient does not have a vascular surgery appointment nor does he have a skin surgery center appointment. I am really not sure what the issues are here although I know vascular surgery as well behind. 8/6-Patient does not have a vascular appointment nor does he have a skin surgery appointment firmed up at this time although he says he is called them both, impressed upon him that he should see whoever is available for the vascular so the vein studies can be interpreted. 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 He is here for the left lateral malleoli wound for which we are using SOrbact, and he is disappointed the wound is not healing 8/21; patient saw Dr. Doren Custard on 01/21/2019. Dr. Doren Custard was concerned about his arterial flow. Noting that he had trouble feeling the dorsalis pedis and posterior tibial arteries also noteworthy that the Doppler had a barely biphasic posterior tibial signal with a monophasic but fairly brisk dorsalis pedis signal. He is booked for an angiogram next Friday. We have been using 3 layer compression on him. 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 Given Dr. Mee Hives concern it was difficult for me to be comfortable with him going ahead with the cancer extraction and I have suggested canceling this. Finally he has an increase in the erythematous contact dermatitis in the entire wrapped area of the left leg which is probably an  allergy to the contact layer of the 3 layer. 8/27; he is going for his angiogram tomorrow by Dr. Doren Custard. We cancel the Mohs surgery on the tumor on his posterior calf pending the angiogram tomorrow. If this shows adequate blood flow to this area I think we can rebook this. His main wound is on the left lateral malleolus a small refractory punched-out area. We have been using Hydrofera Blue 9/3; his angiogram was done as scheduled. He does indeed have PAD. The common femoral, deep femoral superficial femoral and popliteal arteries will are all widely patent. 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 The overall thought was that he had enough adequate circulation to heal his venous ulcer and continue to use mild elevation and compression. Noted that he had biphasic posterior tibial signals with a Doppler ABI of 0.91. It was not felt that  the stenosis in the proximal tibial artery would be associated with increased blood flow he will follow-up with Dr. Doren Custard in 1 month He now needs to get the skin surgery appointment set up. The area is growing larger and bleeding frequently on the posterior left calf per the patient 02/11/19-Patient is back after 1 week, results of his vascular studies are noted, patient needs to have a skin surgery appointment established apparently he did not return the calls this week. The area on the posterior calf is where the skin surgery needs to be done, the left lateral malleoli are wound appears to be about the same we will using Hydrofera Blue change 9/24; patient finally has a booking to deal with the cancer on his posterior calf I believe with the skin surgery center. Not much change in the wound over the left lateral malleolus. It is been 2 weeks since he was here. I changed  him to endoform last time 10/2; the patient has been to see dermatology. Scheduled for surgery on 10/14. Using endoform to the wound over the lateral malleolus some improvement in the wound condition but not the surface area. He has severe chronic venous insufficiency. He also has some arterial disease he was angiogram by Dr. Doren Custard. He did not think that anything needed to be done from an intervention point of view. 10/8; his surgery is still scheduled for 10/14. It is concerning about whether he is healed or maintained skin integrity and is sutured area given the severity of his chronic venous insufficiency. The area we are following is on the left lateral malleolus. Some improvement in the surface area. No debridement today we have been using endoform 10/15; the patient finally had his surgery for the tumor on his left posterior calf. We did not look at this today. Still a punched out area in the lower left malleolus. Perhaps some mild improvement we have been using endoform We were denied for Grafix and still have not heard back on Oasis 10/22; no real change in this wound. I am able to debride it to a healthy looking surface but the next week there is recurrent debris and no improvement in depth. Punched out over the left lateral malleolus. We have not heard back on Oasis. We were denied for Grafix 11/6; wound is perhaps slightly smaller. Still punched out. He was approved for Oasis we applied Oasis #1. His surgical wound on the back of the left calf was reviewed by dermatology we are not reviewing this 11/13; I think the wound is filled in somewhat. Certainly a much more vibrant looking surface. Oasis #2. I looked at his surgical site on the back of the calf today for the first time. Clean incision small open areas 11/19; measurements not much different today. Still 0.4 cm in depth although post debridement the wound surface looks better. Oasis #3 the site on the back of his calf looks  satisfactory. I put him in 4 layer compression last week he seems to tolerate this. He is complaining of some tenderness around the wound. This was controlled swelling around the wound. He seems to have tolerated this 04/28/2019 on evaluation today patient appears to be doing slightly better based on measurements compared to last week. He is here for Oasis application #4 to the ankle region. Fortunately there is no signs of other wounds open at this point and no signs of infection. 12/3; Oasis #5. We have a better looking wound surface but not much change in depth at  0.6 cm. 12/11; Oasis #6 again some improvement this week better looking surface. Perhaps some improvement in depth 12/17 Oasis number 7.3 cm in depth 05/26/2019 patient is here for Oasis application #8 in regard to his wound. He is showing evidence of some improvement right now with the Oasis. He did also go for his x-ray today although the results are not yet available in epic for review. 12/30- Patient returns at 1 week for Oasis application #9 with regard to his left lower extremity foot wound, which is showing some evidence of improvement, the x-ray that was done last week did show slight periosteal elevation and therefore he would be in line to get an MRI to confirm these findings 06/10/2019; the x-ray that the patient had done showed cortical irregularity and suggested an MRI. An MRI was ordered when he was here I believe on 12/23 but we still do not have an appointment for this. I did 9 Oasis on this man and really the improvement is quite dramatic however it is still not closed but certainly a lot better looking than say 5 to 6 weeks ago. The patient is fixated on Apligraf. I told him that I will not consider ordering this until the MRI is done. Today we put endoform on the wound 1/15; the MRI that I ordered was finally done. Unfortunately it actually posed a difficult question rather than answering it. It was felt that there  was subtle periosteal reaction and minimal focal edema in the lateral malleolus. The major differential would be reactive findings rather than necessarily representing early active osteomyelitis. Given the original depth of this wound however I do not know how I can exclude the osteomyelitis and the consequences of not treating this may be disastrous. Electronic Signature(s) Signed: 06/18/2019 6:20:19 PM By: Linton Ham MD Entered By: Linton Ham on 06/18/2019 13:23:29 -------------------------------------------------------------------------------- Physical Exam Details Patient Name: Date of Service: SAMUELA, DERINGER 06/18/2019 11:15 AM Medical Record [id number] Patient Account Number: [id number] Date of Birth/Sex: Treating RN: Aug 13, 1950 (69 y.o. M) Primary Care Provider: PATIENT, NO Other Clinician: Referring Provider: Treating Provider/Extender:Guilherme Schwenke, [name], Designated Weeks in Treatment: 30 Notes Wound exam; left lateral malleolus. There is still about 3 mm of depth of this. Base of this looks healthy. This is nowhere near the size it was for 5 weeks ago. Debrided of surface debris with a #3 curette including subcutaneous debris possibly some retained endoform. No surrounding erythema. The patient has severe chronic venous insufficiency but no edema around the wound. We are keeping him however in compression Electronic Signature(s) Signed: 06/18/2019 6:20:19 PM By: Linton Ham MD Entered By: Linton Ham on 06/18/2019 13:24:53 -------------------------------------------------------------------------------- Physician Orders Details Patient Name: Date of Service: KIEL, MACLELLAN 06/18/2019 11:15 AM Medical Record [id number] Patient Account Number: [id number] Date of Birth/Sex: Treating RN: [birth year] (69 y.o. [name] Primary Care Provider: PATIENT, NO Other Clinician: Referring Provider: Treating Provider/Extender:Rheda Kassab,  [name], Designated Weeks in Treatment: 30 Verbal / Phone Orders: No Diagnosis Coding ICD-10 Coding Code Description [phone] Chronic venous hypertension (idiopathic) with ulcer and inflammation of left lower extremity L97.321 Non-pressure chronic ulcer of left ankle limited to breakdown of skin C44.799 Other specified malignant neoplasm of skin of left lower limb, including hip Follow-up Appointments Return Appointment in 1 week. Dressing Change Frequency Wound #4 Left,Lateral Malleolus Do not change entire dressing for one week. Skin Barriers/Peri-Wound Care TCA Cream or Ointment - liberal to lower leg Wound Cleansing Wound #4 Left,Lateral Malleolus May shower with protection. Primary  Wound Dressing Wound #4 Left,Lateral Malleolus Endoform - moisten with hydrogel cover with adaptic and steri-strips. Secondary Dressing Wound #4 Left,Lateral Malleolus Foam - foam donut. Dry Gauze Other: - pad the left lateral and anterior portion of lower leg. Edema Control 4 layer compression: Left lower extremity - 4 press - no cotton or kerlix layer Avoid standing for long periods of time Elevate legs to the level of the heart or above for 30 minutes daily and/or when sitting, a frequency of: - throughout the day Exercise regularly Patient Medications Allergies: Compazine Notifications Medication Indication Start End benzocaine prior to 06/18/2019 debridement DOSE topical 20 % aerosol - aerosol topical Levaquin osteomyelitis 06/18/2019 DOSE oral 500 mg tablet - 1 tablet oral qd for 2 weeks Electronic Signature(s) Signed: 06/18/2019 1:28:11 PM By: Linton Ham MD Entered By: Linton Ham on 06/18/2019 13:28:09 -------------------------------------------------------------------------------- Problem List Details Patient Name: Date of Service: Michael Post F. 06/18/2019 11:15 AM Medical Record [id number] Patient Account Number: [id number] Date of Birth/Sex: Treating  RN: 1950-11-05 (69 y.o. 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 06/18/2019 11:15 AM Medical Record [id number] Patient Account Number: [id number] Date of Birth/Sex: Treating RN: [birth year] (69 y.o. M) Primary Care Provider: PATIENT, NO Other Clinician: Referring Provider: Treating Provider/Extender:Jema Deegan, [name], Designated Weeks in Treatment: 30 Subjective History of Present Illness (HPI) 08/22/17-He is here for initial evaluation of a right heel wound, right toe wound and left heel fissures. He states that he has chronic dry, flaking skin and frequently has cracked heels. He states the right heel wound developed as such but he developed the wound approximately 3 weeks ago when pulling some skin off; he was unaware of the right toe ulcer. He was treating the right heel with over-the-counter  triple antibiotic ointment and peroxide. He presented to urgent care on 3/12 with a 2 x 2 centimeter ulceration to the right heel and was discharged with a 10 day prescription for Bactrim. He does have a history of neuropathy, diet controlled diabetic. His does not know his most recent A1c but will be seeing his PCP next week, there is no evidence of an A1c in Epic. He denies smoking, does have a history of alcohol use, he admits to 5+/- beers weekly not daily. He did follow-up with cardiology on 3/18 with c/o ble edema, they ordered DVT study which he completed on 3/21. He reports that the bilateral DVT study was negative per the ultrasound technician, no report is available at this time. READMISSION 11/20/2018 this is a now 69 year old man who is here for review of wounds on his lower extremity on the left. We note that he was here on a single visit in March he tells [place]  that over the last 2019. His major issue is that he dropped a box and hit the outside of his left lateral malleolus about 3 weeks ago. This is left him with a nonhealing area. He has been using Iodosorb ointment he had apparently from the last stay in this clinic. Also of note over the last 6 weeks he has noted an area that is raised and will scab over but then he traumatizes a scab and then this bleeds and reopens. This is on the left posterior calf. All of this appears to be on the background of chronic venous insufficiency. He has had DVT studies in the past that were negative in 2019. He is also had arterial studies that showed an ABI on the right of 0.76 and on the left of 1.05. Past medical history; the patient states that he is not a diabetic although I note there is some suggested he was in the note from last year. He has a history of coronary artery disease. He tells me that he has had a history of skin cancer but he is not sure which one but that includes one on the left upper thigh. ABIs done previously 0.76 on the  right and 1.05 on the left 6/26; patient readmitted to the clinic last week. He had a traumatic area over the left lateral malleolus as well as a hyper granulated growth on the left posterior calf. He also has severe chronic venous insufficiency. 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 We have been using Iodoflex to the traumatic wound under compression. 7/9; area over the left lateral malleolus continues to require debridement and we are continuing with Iodoflex under compression. He has severe chronic venous insufficiency. 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 This patient has severe bilateral venous insufficiency and venous hypertension with skin changes resulting. He requires bilateral reflux studies Finally he traumatized his right lateral calf while doing yard work about a week ago. The area is small with a flap of skin over the majority of the wound but I am not sure this is going to remain viable 7/16; still not a viable surface on the left lateral malleolus in fact this wound is deeper. We have been using Iodoflex. 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 He has severe bilateral venous insufficiency and I have ordered reflux studies these apparently are booked for next week. Our intake nurse noted some yellowish-green drainage coming from the wound on the left ankle. Patient was insistent on talking about doing carotid ultrasounds and he was hoping to get these done with his reflux studies next week. I spent a few minutes talking to him about this I just could not put together in a logical way what he is concerned about. At the end of this I asked him to see his primary doctor about this he  says he does not have a primary doctor advised him to get a primary doctor to go over this with him. It was not really clear where the level of concern was specific to carotid ultrasound 7/23; a better looking surface on the left lateral malleolus although it still requires debridement. He tells  me he has an appointment to Mohs surgery center next week. Small area on the right lateral calf. The patient went to [facility] I do not think they took his compression wraps off. He did have reflux on the left in the common femoral greater saphenous and saphenofemoral junction and the great saphenous vein in the proximal thigh. 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 Looking at the numbers it would appear that the maximal reflux was in the great saphenous vein in the proximal thigh. As usual I am not sure what would be possible to do here. I will send him to vascular surgery 7/30; the patient still requires debridement in the left lateral malleolus although in general the wound is still deep and and punched out. We are using Sorbact. The area on the right lateral calf is improved. 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 The patient does not have a vascular surgery appointment nor does he have a skin surgery center appointment. I am really not sure what the issues are here although I know vascular surgery as well behind. 8/6-Patient does not have a vascular appointment nor does he have a skin surgery appointment firmed up at this time although he says he is called them both, impressed upon him that he should see whoever is available for the vascular so the vein studies can be interpreted. Stressed again the importance of having the skin cancer removed, patient expressed concerns about wound healing from that wound  which I reassured would be reasonably good chances to heal, At any rate excision of the squamous cancer is a priority. 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 He is here for the left lateral malleoli wound for which we are using SOrbact, and he is disappointed the wound is not healing 8/21; patient saw Dr. Doren Custard on 01/21/2019. Dr. Doren Custard was concerned about his arterial flow. Noting that he had trouble feeling the dorsalis pedis and posterior tibial arteries also noteworthy that the Doppler had a barely biphasic posterior tibial signal with a monophasic but fairly brisk dorsalis pedis signal. He is booked for an angiogram next Friday. We have been using 3 layer compression on him. 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 Given Dr. Mee Hives concern it was difficult for me to be comfortable with him going ahead with the cancer extraction and I have suggested canceling this. Finally he has an increase in the erythematous contact dermatitis in the entire wrapped area of the left leg which is probably an allergy to the contact layer of the 3 layer. 8/27; he is going for his angiogram tomorrow by Dr. Doren Custard. We cancel the Mohs surgery on the tumor on his posterior calf pending the angiogram tomorrow. If this shows adequate blood flow to this area I think we can rebook this. His main wound is on the left lateral malleolus a small refractory punched-out area. We have been using Hydrofera Blue 9/3; his angiogram was done as scheduled. He does indeed have PAD. The common femoral, deep femoral superficial femoral and popliteal  arteries will are all widely patent. There was single-vessel runoff on the left via the posterior tibial artery that had 1 focal area with  perhaps 40 to 50% stenosis the anterior tibial and peroneal arteries are occluded he had good runoff into the foot and plantar arch. The overall thought was that he had enough adequate circulation to heal his venous ulcer and continue to use mild elevation and compression. Noted that he had biphasic posterior tibial signals with a Doppler ABI of 0.91. It was not felt that the stenosis in the proximal tibial artery would be associated with increased blood flow he will follow-up with Dr. Doren Custard in 1 month He now needs to get the skin surgery appointment set up. The area is growing larger and bleeding frequently on the posterior left calf per the patient 02/11/19-Patient is back after 1 week, results of his vascular studies are noted, patient needs to have a skin surgery appointment established apparently he did not return the calls this week. The area on the posterior calf is where the skin surgery needs to be done, the left lateral malleoli are wound appears to be about the same we will using Hydrofera Blue change 9/24; patient finally has a booking to deal with the cancer on his posterior calf I believe with the skin surgery center. Not much change in the wound over the left lateral malleolus. It is been 2 weeks since he was here. I changed him to endoform last time 10/2; the patient has been to see dermatology. Scheduled for surgery on 10/14. Using endoform to the wound over the lateral malleolus some improvement in the wound condition but not the surface area. He has severe chronic venous insufficiency. He also has some arterial disease he was angiogram by Dr. Doren Custard. He did not think that anything needed to be done from an intervention point of view. 10/8; his surgery is still scheduled for 10/14. It is concerning about whether he is healed or maintained skin integrity and is sutured area given the severity of his chronic venous insufficiency. The area we are following is on the left lateral  malleolus. Some improvement in the surface area. No debridement today we have been using endoform 10/15; the patient finally had his surgery for the tumor on his left posterior calf. We did not look at this today. Still a punched out area in the lower left malleolus. Perhaps some mild improvement we have been using endoform We were denied for Grafix and still have not heard back on Oasis 10/22; no real change in this wound. I am able to debride it to a healthy looking surface but the next week there is recurrent debris and no improvement in depth. Punched out over the left lateral malleolus. We have not heard back on Oasis. We were denied for Grafix 11/6; wound is perhaps slightly smaller. Still punched out. He was approved for Oasis we applied Oasis #1. His surgical wound on the back of the left calf was reviewed by dermatology we are not reviewing this 11/13; I think the wound is filled in somewhat. Certainly a much more vibrant looking surface. Oasis #2. I looked at his surgical site on the back of the calf today for the first time. Clean incision small open areas 11/19; measurements not much different today. Still 0.4 cm in depth although post debridement the wound surface looks better. Oasis #3 the site on the back of his calf looks satisfactory. I put him in 4 layer compression last week he  seems to tolerate this. He is complaining of some tenderness around the wound. This was controlled swelling around the wound. He seems to have tolerated this 04/28/2019 on evaluation today patient appears to be doing slightly better based on measurements compared to last week. He is here for Oasis application #4 to the ankle region. Fortunately there is no signs of other wounds open at this point and no signs of infection. 12/3; Oasis #5. We have a better looking wound surface but not much change in depth at 0.6 cm. 12/11; Oasis #6 again some improvement this week better looking surface. Perhaps some  improvement in depth 12/17 Oasis number 7.3 cm in depth 05/26/2019 patient is here for Oasis application #8 in regard to his wound. He is showing evidence of some improvement right now with the Oasis. He did also go for his x-ray today although the results are not yet available in epic for review. 12/30- Patient returns at 1 week for Oasis application #9 with regard to his left lower extremity foot wound, which is showing some evidence of improvement, the x-ray that was done last week did show slight periosteal elevation and therefore he would be in line to get an MRI to confirm these findings 06/10/2019; the x-ray that the patient had done showed cortical irregularity and suggested an MRI. An MRI was ordered when he was here I believe on 12/23 but we still do not have an appointment for this. I did 9 Oasis on this man and really the improvement is quite dramatic however it is still not closed but certainly a lot better looking than say 5 to 6 weeks ago. The patient is fixated on Apligraf. I told him that I will not consider ordering this until the MRI is done. Today we put endoform on the wound 1/15; the MRI that I ordered was finally done. Unfortunately it actually posed a difficult question rather than answering it. It was felt that there was subtle periosteal reaction and minimal focal edema in the lateral malleolus. The major differential would be reactive findings rather than necessarily representing early active osteomyelitis. Given the original depth of this wound however I do not know how I can exclude the osteomyelitis and the consequences of not treating this may be disastrous. Objective Constitutional Vitals Time Taken: 12:13 PM, Height: 73 in, Weight: 205 lbs, BMI: 27, Temperature: 98.1 F, Pulse: 83 bpm, Respiratory Rate: 18 breaths/min, Blood Pressure: 168/79 mmHg. Integumentary (Hair, Skin) Wound #4 status is Open. Original cause of wound was Trauma. The wound is located on the  Left,Lateral Malleolus. The wound measures 0.7cm length x 0.8cm width x 0.4cm depth; 0.44cm^2 area and 0.176cm^3 volume. There is Fat Layer (Subcutaneous Tissue) Exposed exposed. There is no tunneling or undermining noted. There is a medium amount of serous drainage noted. The wound margin is well defined and not attached to the wound base. There is medium (34-66%) pink granulation within the wound bed. There is a medium (34-66%) amount of necrotic tissue within the wound bed including Adherent Slough. 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 There was a Excisional Skin/Subcutaneous  Tissue Debridement with a total area of 0.56 sq cm performed by Ricard Dillon., MD. With the following instrument(s): Curette to remove Viable and Non-Viable tissue/material. Material removed includes Subcutaneous Tissue and Slough and after achieving pain control using Other (Benzocaine 20%). No specimens were taken. A time out was conducted at 12:45, prior to the start of the procedure. A Minimum amount of bleeding was controlled with Pressure. The procedure was tolerated well with a pain level of 1 throughout and a pain level of 0 following the procedure. Post Debridement Measurements: 0.7cm length x 0.8cm width x 0.4cm depth; 0.176cm^3 volume. Character of Wound/Ulcer Post Debridement is improved. Severity of Tissue Post Debridement is: Fat layer exposed. Post procedure Diagnosis Wound #4: Same as Pre-Procedure Pre-procedure diagnosis of  Wound #4 is a Venous Leg Ulcer located on the Left,Lateral Malleolus . There was a Four Layer Compression Therapy Procedure by [name], RN. Post procedure Diagnosis Wound #4: Same as Pre-Procedure Plan Follow-up Appointments: Return Appointment in 1 week. Dressing Change Frequency: Wound #4 Left,Lateral Malleolus: Do not change entire dressing for one week. Skin Barriers/Peri-Wound Care: TCA Cream or Ointment - liberal to lower leg Wound Cleansing: Wound #4 Left,Lateral Malleolus: May shower with protection. Primary Wound Dressing: Wound #4 Left,Lateral Malleolus: Endoform - moisten with hydrogel cover with adaptic and steri-strips. Secondary Dressing: Wound #4 Left,Lateral Malleolus: Foam - foam donut. Dry Gauze Other: - pad the left lateral and anterior portion of lower leg. Edema Control: 4 layer compression: Left lower extremity - 4 press - no cotton or kerlix layer Avoid standing for long periods of time Elevate legs to the level of the heart or above for 30 minutes daily and/or when sitting, a frequency of: - throughout the day Exercise regularly The following medication(s) was prescribed: benzocaine topical 20 % aerosol aerosol topical for prior to debridement was prescribed at facility Levaquin oral 500 mg tablet 1 tablet oral qd for 2 weeks for osteomyelitis starting 06/18/2019 1. I am continuing endoform. I continue to think we are making large improvement 2. The question about the periosteal reaction raised by the radiologist left me in an uncomfortable situation. I feel obligated to go ahead and at least give this patient a month of antibiotics if I can get him through a month of antibiotics. 3. Once again the fairly detailed and abrupt discussion about Apligraf which he seems to be completely fixated on. Even though this wound appears to be closing down with more standard treatment, he seems fixated on me applying this. I told him I was not even willing to  consider an advanced product until he has had 2 weeks of antibiotics. Electronic Signature(s) Signed: 06/18/2019 1:28:33 PM By: Linton Ham MD Entered By: Linton Ham on 06/18/2019 13:28:33 -------------------------------------------------------------------------------- SuperBill Details Patient Name: Date of Service: RESHARD, KITTELSON 06/18/2019 Medical Record [id number] Patient Account Number: [id number] Date of Birth/Sex: Treating RN: [id number] (69 y.o. 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

## 2019-06-23 NOTE — Progress Notes (Addendum)
Michael, [name] ([id number]) Visit Report for 06/18/2019 Arrival Information Details Patient Name: Date of Service: [name]. 06/18/2019 11:15 A M Medical Record Number: [id number] Patient Account Number: [id number] Date of Birth/Sex: Treating RN: 1951/02/09 (68 y.o. [name]) Carlene Coria Primary Care Gari Trovato: PA Haig [place], [place] Other Clinician: Referring Austyn Seier: Treating Keighan Amezcua/Extender: Linton Ham None, Designated Weeks in Treatment: 30 Visit Information History Since Last Visit All ordered tests and consults were completed: No Patient Arrived: Ambulatory Added or deleted any medications: No Arrival Time: 12:12 Any new allergies or adverse reactions: No Accompanied By: self Had a fall or experienced change in No Transfer Assistance: None activities of daily living that may affect Patient Identification Verified: Yes risk of falls: Secondary Verification Process Completed: Yes Signs or symptoms of abuse/neglect since last visito No Patient Requires Transmission-Based Precautions: No Hospitalized since last visit: No Patient Has Alerts: Yes Implantable device outside of the clinic excluding No Patient Alerts: ABIs 08/2018 L1.05 R0.76 cellular tissue based products placed in the center since last visit: Has Dressing in Place as Prescribed: Yes Has Compression in Place as Prescribed: Yes Pain Present Now: Yes Electronic Signature(s) Signed: 06/23/2019 5:42:11 PM By: Carlene Coria RN Entered By: Carlene Coria on 06/18/2019 12:13:26 -------------------------------------------------------------------------------- Compression Therapy Details Patient Name: Date of Service: [name] RLES F. 06/18/2019 11:15 A M Medical Record Number: [id number] Patient Account Number: [id number] Date of Birth/Sex: Treating RN: 12-Aug-1950 (69 y.o. [name] Primary Care Audrick Lamoureaux: PA Haig [place], [place] Other Clinician: Referring [name]: Treating Berneice Zettlemoyer/Extender: Linton Ham None,  Designated Weeks in Treatment: 30 Compression Therapy Performed for Wound Assessment: Wound #4 Left,Lateral Malleolus Performed By: Clinician [name], RN Compression Type: Four Layer Post Procedure Diagnosis Same as Pre-procedure Electronic Signature(s) Signed: 06/18/2019 6:26:16 PM By: Baruch Gouty RN, BSN Entered By: Baruch Gouty on 06/18/2019 12:49:00 -------------------------------------------------------------------------------- Lower Extremity Assessment Details Patient Name: Date of Service: [name] RLES F. 06/18/2019 11:15 A M Medical Record Number: [id number] Patient Account Number: [id number] Date of Birth/Sex: Treating RN: [birth year] (68 y.o. [name]) Carlene Coria Primary Care [name]: PA Haig [place], [place] Other Clinician: Referring Cantrell Martus: Treating Tiwana Chavis/Extender: Linton Ham None, Designated Weeks in Treatment: 30 Edema Assessment Assessed: [Left: No] [Right: No] Edema: [Left: N] [Right: o] Calf Left: Right: Point of Measurement: 35 cm From Medial Instep 32 cm cm Ankle Left: Right: Point of Measurement: 9 cm From Medial Instep 22 cm cm Electronic Signature(s) Signed: 06/23/2019 5:42:11 PM By: Carlene Coria RN Entered By: Carlene Coria on 06/18/2019 12:21:41 -------------------------------------------------------------------------------- Multi Wound Chart Details Patient Name: Date of Service: [name], CHA RLES F. 06/18/2019 11:15 A M Medical Record Number: [id number] Patient Account Number: [id number] Date of Birth/Sex: Treating RN: [birth year] (69 y.o. M) Primary Care [name]: PA Haig [place], [place] Other Clinician: Referring [name]: Treating Madason Rauls/Extender: Silvano Rusk, Designated Weeks in Treatment: 30 Vital Signs Height(in): 73 Pulse(bpm): 83 Weight(lbs): 205 Blood Pressure(mmHg): 168/79 Body Mass Index(BMI): 27 Temperature(F): 98.1 Respiratory Rate(breaths/min): 18 Photos: [4:No Photos Left Malleolus - Lateral] [N/A:N/A  N/A] Wound Location: [4:Trauma] [N/A:N/A] Wounding Event: [4:Venous Leg Ulcer] [N/A:N/A] Primary Etiology: [4:Arrhythmia, Coronary Artery Disease,] [N/A:N/A] Comorbid History: [4:Type II Diabetes 11/02/2018] [N/A:N/A] Date Acquired: [4:30] [N/A:N/A] Weeks of Treatment: [4:Open] [N/A:N/A] Wound Status: [4:0.7x0.8x0.4] [N/A:N/A] Measurements L x W x D (cm) [4:0.44] [N/A:N/A] A (cm) : rea [4:0.176] [N/A:N/A] Volume (cm) : [4:66.00%] [N/A:N/A] % Reduction in Area: [4:-35.40%] [N/A:N/A] % Reduction in Volume: [4:Full Thickness Without Exposed] [N/A:N/A] Classification: [4:Support Structures Medium] [N/A:N/A] Exudate Amount: [4:Serous] [N/A:N/A] Exudate Type: [4:amber] [  N/A:N/A] Exudate Color: [4:Well defined, not attached] [N/A:N/A] Wound Margin: [4:Medium (34-66%)] [N/A:N/A] Granulation Amount: [4:Pink] [N/A:N/A] Granulation Quality: [4:Medium (34-66%)] [N/A:N/A] Necrotic Amount: [4:Fat Layer (Subcutaneous Tissue)] [N/A:N/A] Exposed Structures: [4:Exposed: Yes Fascia: No Tendon: No Muscle: No Joint: No Bone: No Small (1-33%)] [N/A:N/A] Epithelialization: [4:Debridement - Excisional] [N/A:N/A] Debridement: Pre-procedure Verification/Time Out 12:45 [N/A:N/A] Taken: [4:Other] [N/A:N/A] Pain Control: [4:Subcutaneous, Slough] [N/A:N/A] Tissue Debrided: [4:Skin/Subcutaneous Tissue] [N/A:N/A] Level: [4:0.56] [N/A:N/A] Debridement A (sq cm): [4:rea Curette] [N/A:N/A] Instrument: [4:Minimum] [N/A:N/A] Bleeding: [4:Pressure] [N/A:N/A] Hemostasis A chieved: [4:1] [N/A:N/A] Procedural Pain: [4:0] [N/A:N/A] Post Procedural Pain: [4:Procedure was tolerated well] [N/A:N/A] Debridement Treatment Response: [4:0.7x0.8x0.4] [N/A:N/A] Post Debridement Measurements L x W x D (cm) [4:0.176] [N/A:N/A] Post Debridement Volume: (cm) [4:Compression Therapy] [N/A:N/A] Procedures Performed: [4:Debridement] Treatment Notes Electronic Signature(s) Signed: 06/18/2019 6:20:19 PM By: Linton Ham  MD Entered By: Linton Ham on 06/18/2019 13:21:48 -------------------------------------------------------------------------------- Multi-Disciplinary Care Plan Details Patient Name: Date of Service: [name], CHA RLES F. 06/18/2019 11:15 A M Medical Record Number: [id number] Patient Account Number: [id number] Date of Birth/Sex: Treating RN: 06-Oct-1950 (69 y.o. [name] Primary Care Andrews Tener: PA Haig [place], [place] Other Clinician: Referring Ertha Nabor: Treating Lora Chavers/Extender: Silvano Rusk, Designated Weeks in Treatment: 30 Active Inactive Venous Leg Ulcer Nursing Diagnoses: Knowledge deficit related to disease process and management Potential for venous Insuffiency (use before diagnosis confirmed) Goals: Patient will maintain optimal edema control Date Initiated: 11/20/2018 Target Resolution Date: 07/02/2019 Goal Status: Active Patient/caregiver will verbalize understanding of disease process and disease management Date Initiated: 11/20/2018 Date Inactivated: 12/17/2018 Target Resolution Date: 12/18/2018 Goal Status: Met Interventions: Assess peripheral edema status every visit. Compression as ordered Provide education on venous insufficiency Treatment Activities: Therapeutic compression applied : 11/20/2018 Notes: Electronic Signature(s) Signed: 06/18/2019 6:26:16 PM By: Baruch Gouty RN, BSN Entered By: Baruch Gouty on 06/18/2019 12:47:04 -------------------------------------------------------------------------------- Pain Assessment Details Patient Name: Date of Service: [name], CHA RLES F. 06/18/2019 11:15 A M Medical Record Number: [id number] Patient Account Number: [id number] Date of Birth/Sex: Treating RN: [birth year] (68 y.o. [name]) Carlene Coria Primary Care Candiss Galeana: PA Haig [place], [place] Other Clinician: Referring Gisele Pack: Treating Ashten Sarnowski/Extender: Linton Ham None, Designated Weeks in Treatment: 30 Active Problems Location of Pain Severity and  Description of Pain Patient Has Paino Yes Site Locations With Dressing Change: Yes Duration of the Pain. Constant / Intermittento Constant Rate the pain. Current Pain Level: 5 Worst Pain Level: 6 Least Pain Level: 4 Tolerable Pain Level: 5 Character of Pain Describe the Pain: Aching, Throbbing Pain Management and Medication Current Pain Management: Medication: Yes Cold Application: No Rest: Yes Massage: No Activity: No T.E.N.S.: No Heat Application: No Leg drop or elevation: No Is the Current Pain Management Adequate: Inadequate How does your wound impact your activities of daily livingo Sleep: Yes Bathing: No Appetite: No Relationship With Others: No Bladder Continence: No Emotions: No Bowel Continence: No Work: No Toileting: No Drive: No Dressing: No Hobbies: No Electronic Signature(s) Signed: 06/23/2019 5:42:11 PM By: Carlene Coria RN Entered By: Carlene Coria on 06/18/2019 12:15:21 -------------------------------------------------------------------------------- Patient/Caregiver Education Details Patient Name: Date of Service: [name] 1/15/2021andnbsp11:15 A M Medical Record Number: [id number] Patient Account Number: [id number] Date of Birth/Gender: Treating RN: [birth year] (69 y.o. [name] Primary Care Physician: PA Haig [place], [place] Other Clinician: Referring Physician: Treating Physician/Extender: Silvano Rusk, Designated Weeks in Treatment: 30 Education Assessment Education Provided To: Patient Education Topics Provided Venous: Methods: Explain/Verbal Responses: Reinforcements needed, State content correctly Wound/Skin Impairment: Methods: Explain/Verbal Responses: Reinforcements needed, State content correctly Electronic Signature(s)  Signed: 06/18/2019 6:26:16 PM By: Baruch Gouty RN, BSN Entered By: Baruch Gouty on 06/18/2019  12:47:21 -------------------------------------------------------------------------------- Wound Assessment Details Patient Name: Date of Service: [name], [place] F. 06/18/2019 11:15 A M Medical Record Number: [id number] Patient Account Number: [id number] Date of Birth/Sex: Treating RN: [birth year] (68 y.o. [name]) Carlene Coria Primary Care Cherylene Ferrufino: PA [place]ig [place], [place] Other Clinician: Referring Esai Stecklein: Treating Jaquetta Currier/Extender: Linton Ham None, Designated Weeks in Treatment: 30 Wound Status Wound Number: 4 Primary Etiology: Venous Leg Ulcer Wound Location: Left Malleolus - Lateral Wound Status: Open Wounding Event: Trauma Comorbid History: Arrhythmia, Coronary Artery Disease, Type II Diabetes Date Acquired: 11/02/2018 Weeks Of Treatment: 30 Clustered Wound: No Photos Wound Measurements Length: (cm) 0.7 Width: (cm) 0.8 Depth: (cm) 0.4 Area: (cm) 0.44 Volume: (cm) 0.176 % Reduction in Area: 66% % Reduction in Volume: -35.4% Epithelialization: Small (1-33%) Tunneling: No Undermining: No Wound Description Classification: Full Thickness Without Exposed Support Structures Wound Margin: Well defined, not attached Exudate Amount: Medium Exudate Type: Serous Exudate Color: amber Foul Odor After Cleansing: No Slough/Fibrino Yes Wound Bed Granulation Amount: Medium (34-66%) Exposed Structure Granulation Quality: Pink Fascia Exposed: No Necrotic Amount: Medium (34-66%) Fat Layer (Subcutaneous Tissue) Exposed: Yes Necrotic Quality: Adherent Slough Tendon Exposed: No Muscle Exposed: No Joint Exposed: No Bone Exposed: No Electronic Signature(s) Signed: 06/28/2019 4:40:42 PM By: Mikeal Hawthorne EMT/HBOT Signed: 11/30/2019 5:27:17 PM By: Carlene Coria RN Previous Signature: 06/23/2019 5:42:11 PM Version By: Carlene Coria RN Entered By: Mikeal Hawthorne on 06/25/2019 09:30:15 -------------------------------------------------------------------------------- Vitals Details Patient  Name: Date of Service: [name], CHA RLES F. 06/18/2019 11:15 A M Medical Record Number: [id number] Patient Account Number: [id number] Date of Birth/Sex: Treating RN: [birth year] (68 y.o. [name]) Carlene Coria Primary Care Raynald Rouillard: PA Haig [place], [place] Other Clinician: Referring Nanami Whitelaw: Treating Pietra Zuluaga/Extender: Linton Ham None, Designated Weeks in Treatment: 30 Vital Signs Time Taken: 12:13 Temperature (F): 98.1 Height (in): 73 Pulse (bpm): 83 Weight (lbs): 205 Respiratory Rate (breaths/min): 18 Body Mass Index (BMI): 27 Blood Pressure (mmHg): 168/79 Reference Range: 80 - 120 mg / dl Electronic Signature(s) Signed: 06/23/2019 5:42:11 PM By: Carlene Coria RN Entered By: Carlene Coria on 06/18/2019 12:14:30

## 2019-06-24 ENCOUNTER — Encounter (HOSPITAL_BASED_OUTPATIENT_CLINIC_OR_DEPARTMENT_OTHER): Payer: Medicare Other | Admitting: Internal Medicine

## 2019-06-24 ENCOUNTER — Other Ambulatory Visit: Payer: Self-pay

## 2019-06-24 DIAGNOSIS — I87332 Chronic venous hypertension (idiopathic) with ulcer and inflammation of left lower extremity: Secondary | ICD-10-CM | POA: Diagnosis not present

## 2019-06-24 NOTE — Progress Notes (Signed)
[name], [name] ([id number]) Visit Report for 06/24/2019 HPI Details Patient Name: Date of Service: [name], [name] 06/24/2019 9:15 AM Medical Record [id number] Patient Account Number: [id number] Date of Birth/Sex: Treating RN: [birth year] (69 y.o. M) Primary Care Provider: PATIENT, NO Other Clinician: Referring Provider: Treating Provider/Extender:[name], [name], Designated Weeks in Treatment: 30 History of Present Illness HPI Description: 08/22/17-He is here for initial evaluation of a right heel wound, right toe wound and left heel fissures. He states that he has chronic dry, flaking skin and frequently has cracked heels. He states the right heel wound developed as such but he developed the wound approximately 3 weeks ago when pulling some skin off; he was unaware of the right toe ulcer. He was treating the right heel with over-the-counter triple antibiotic ointment and peroxide. He presented to urgent care on 3/12 with a 2 x 2 centimeter ulceration to the right heel and was discharged with a 10 day prescription for Bactrim. He does have a history of neuropathy, diet controlled diabetic. His does not know his most recent A1c but will be seeing his PCP next week, there is no evidence of an A1c in Epic. He denies smoking, does have a history of alcohol use, he admits to 5+/- beers weekly not daily. He did follow-up with cardiology on 3/18 with c/o ble edema, they ordered DVT study which he completed on 3/21. He reports that the bilateral DVT study was negative per the ultrasound technician, no report is available at this time. READMISSION 11/20/2018 this is a now 69 year old man who is here for review of wounds on his lower extremity on the left. We note that he was here on a single visit in March he tells [place] that over the last 2019. His major issue is that he dropped a box and hit the outside of his left lateral malleolus about 3 weeks ago. This is left him with a  nonhealing area. He has been using Iodosorb ointment he had apparently from the last stay in this clinic. Also of note over the last 6 weeks he has noted an area that is raised and will scab over but then he traumatizes a scab and then this bleeds and reopens. This is on the left posterior calf. All of this appears to be on the background of chronic venous insufficiency. He has had DVT studies in the past that were negative in 2019. He is also had arterial studies that showed an ABI on the right of 0.76 and on the left of 1.05. Past medical history; the patient states that he is not a diabetic although I note there is some suggested he was in the note from last year. He has a history of coronary artery disease. He tells me that he has had a history of skin cancer but he is not sure which one but that includes one on the left upper thigh. ABIs done previously 0.76 on the right and 1.05 on the left 6/26; patient readmitted to the clinic last week. He had a traumatic area over the left lateral malleolus as well as a hyper granulated growth on the left posterior calf. He also has severe chronic venous insufficiency. 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 We have been using Iodoflex to the traumatic wound under compression. 7/9; area over the left lateral malleolus continues to require debridement and we are continuing with Iodoflex under compression. He has severe chronic venous insufficiency. The  biopsy I did that showed squamous cell carcinoma with the area on the posterior left calf requires a referral to skin surgery, so far he does not have an appointment. This patient has severe bilateral venous insufficiency and venous hypertension with skin changes resulting. He requires bilateral reflux studies Finally he traumatized his right lateral calf while doing yard work about a week ago. The area is small with a flap of skin over the majority of  the wound but I am not sure this is going to remain viable 7/16; still not a viable surface on the left lateral malleolus in fact this wound is deeper. We have been using Iodoflex. 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 He has severe bilateral venous insufficiency and I have ordered reflux studies these apparently are booked for next week. Our intake nurse noted some yellowish-green drainage coming from the wound on the left ankle. Patient was insistent on talking about doing carotid ultrasounds and he was hoping to get these done with his reflux studies next week. I spent a few minutes talking to him about this I just could not put together in a logical way what he is concerned about. At the end of this I asked him to see his primary doctor about this he says he does not have a primary doctor advised him to get a primary doctor to go over this with him. It was not really clear where the level of concern was specific to carotid ultrasound 7/23; a better looking surface on the left lateral malleolus although it still requires debridement. He tells me he has an appointment to Mohs surgery center next week. Small area on the right lateral calf. The patient went to have [facility] I do not think they took his compression wraps off. He did have reflux on the left in the common femoral greater saphenous and saphenofemoral junction and the great saphenous vein in the proximal thigh. 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 Looking at the numbers it would appear that the maximal reflux was in the great saphenous vein in the proximal thigh. As usual I am not sure what would be possible to do here. I will send him to vascular  surgery 7/30; the patient still requires debridement in the left lateral malleolus although in general the wound is still deep and and punched out. We are using Sorbact. The area on the right lateral calf is improved. 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 The patient does not have a vascular surgery appointment nor does he have a skin surgery center appointment. I am really not sure what the issues are here although I know vascular surgery as well behind. 8/6-Patient does not have a vascular appointment nor does he have a skin surgery appointment firmed up at this time although he says he is called them both, impressed upon him that he should see whoever is available for the vascular so the vein studies can be interpreted. 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 He is here for the left lateral malleoli wound for which we are using SOrbact, and he is disappointed the wound is not healing 8/21; patient saw Dr. Doren Custard on 01/21/2019. Dr. Doren Custard was concerned about his arterial flow. Noting that he had trouble feeling the dorsalis pedis and posterior tibial arteries also noteworthy that the Doppler had a barely biphasic posterior tibial signal with a monophasic but fairly brisk dorsalis pedis signal. He is booked for an angiogram next Friday. We have been using 3 layer compression on him. 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 Given Dr. Mee Hives concern it was difficult for me to be  comfortable with him going ahead with the cancer extraction and I have suggested canceling this. Finally he has an increase in the erythematous contact dermatitis in the entire wrapped area of the left leg which is probably an allergy to the contact layer of the 3 layer. 8/27; he is going for his angiogram tomorrow by Dr. Doren Custard. We cancel the Mohs surgery on the tumor on his posterior calf pending the angiogram tomorrow. If this shows adequate blood flow to this area I think we can rebook this. His main wound is on the left lateral malleolus a small refractory punched-out area. We have been using Hydrofera Blue 9/3; his angiogram was done as scheduled. He does indeed have PAD. The common femoral, deep femoral superficial femoral and popliteal arteries will are all widely patent. 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 The overall thought was that he had enough adequate circulation to heal his venous ulcer and continue to use mild elevation and compression. Noted that he had biphasic posterior tibial signals with a Doppler ABI of 0.91. It was not felt that the stenosis in the proximal tibial artery would be associated with increased blood flow he will follow-up with Dr. Doren Custard in 1 month He now needs to get the skin surgery appointment set up. The area is growing larger and bleeding frequently on the posterior left calf per the patient 02/11/19-Patient is back after 1 week, results of his vascular studies are noted, patient needs to have a skin surgery appointment established apparently he did not return the calls this week. The area on the posterior calf is where the skin surgery needs to be done, the left lateral malleoli are wound appears to be about the same we will using Hydrofera Blue change 9/24;  patient finally has a booking to deal with the cancer on his posterior calf I believe with the skin surgery center. Not much change in the wound over the left lateral malleolus. It is been 2 weeks since he was here. I changed him to endoform last time 10/2; the patient has been to see dermatology. Scheduled for surgery on 10/14. Using endoform to the wound over the lateral malleolus some improvement in the wound condition but not the surface area. He has severe chronic venous insufficiency. He also has some arterial disease he was angiogram by Dr. Doren Custard.  He did not think that anything needed to be done from an intervention point of view. 10/8; his surgery is still scheduled for 10/14. It is concerning about whether he is healed or maintained skin integrity and is sutured area given the severity of his chronic venous insufficiency. The area we are following is on the left lateral malleolus. Some improvement in the surface area. No debridement today we have been using endoform 10/15; the patient finally had his surgery for the tumor on his left posterior calf. We did not look at this today. Still a punched out area in the lower left malleolus. Perhaps some mild improvement we have been using endoform We were denied for Grafix and still have not heard back on Oasis 10/22; no real change in this wound. I am able to debride it to a healthy looking surface but the next week there is recurrent debris and no improvement in depth. Punched out over the left lateral malleolus. We have not heard back on Oasis. We were denied for Grafix 11/6; wound is perhaps slightly smaller. Still punched out. He was approved for Oasis we applied Oasis #1. His surgical wound on the back of the left calf was reviewed by dermatology we are not reviewing this 11/13; I think the wound is filled in somewhat. Certainly a much more vibrant looking surface. Oasis #2. I looked at his surgical site on the back of the calf today for  the first time. Clean incision small open areas 11/19; measurements not much different today. Still 0.4 cm in depth although post debridement the wound surface looks better. Oasis #3 the site on the back of his calf looks satisfactory. I put him in 4 layer compression last week he seems to tolerate this. He is complaining of some tenderness around the wound. This was controlled swelling around the wound. He seems to have tolerated this 04/28/2019 on evaluation today patient appears to be doing slightly better based on measurements compared to last week. He is here for Oasis application #4 to the ankle region. Fortunately there is no signs of other wounds open at this point and no signs of infection. 12/3; Oasis #5. We have a better looking wound surface but not much change in depth at 0.6 cm. 12/11; Oasis #6 again some improvement this week better looking surface. Perhaps some improvement in depth 12/17 Oasis number 7.3 cm in depth 05/26/2019 patient is here for Oasis application #8 in regard to his wound. He is showing evidence of some improvement right now with the Oasis. He did also go for his x-ray today although the results are not yet available in epic for review. 12/30- Patient returns at 1 week for Oasis application #9 with regard to his left lower extremity foot wound, which is showing some evidence of improvement, the x-ray that was done last week did show slight periosteal elevation and therefore he would be in line to get an MRI to confirm these findings 06/10/2019; the x-ray that the patient had done showed cortical irregularity and suggested an MRI. An MRI was ordered when he was here I believe on 12/23 but we still do not have an appointment for this. I did 9 Oasis on this man and really the improvement is quite dramatic however it is still not closed but certainly a lot better looking than say 5 to 6 weeks ago. The patient is fixated on Apligraf. I told him that I will not consider  ordering this until the MRI is done. Today  we put endoform on the wound 1/15; the MRI that I ordered was finally done. Unfortunately it actually posed a difficult question rather than answering it. It was felt that there was subtle periosteal reaction and minimal focal edema in the lateral malleolus. The major differential would be reactive findings rather than necessarily representing early active osteomyelitis. Given the original depth of this wound however I do not know how I can exclude the osteomyelitis and the consequences of not treating this may be disastrous. 1/21; the patient arrives in clinic today unfortunately had scissor injuries to his upper left medial and mid calf. The worst one here is the mid calf. His wound actually continues to contract. He is taking Levaquin which I am trying to get into him for a month. He is says he is having 1-2 loose stools a day but this does not appear to be watery diarrhea. He is also strongly requesting another round of Oasis. This is after I managed to talk him out of Apligraf. Frankly the wound is too small for an Apligraf now Electronic Signature(s) Signed: 06/24/2019 5:54:58 PM By: Linton Ham MD Entered By: Linton Ham on 06/24/2019 10:57:31 -------------------------------------------------------------------------------- Physical Exam Details Patient Name: Date of Service: [name], [name] 06/24/2019 9:15 AM Medical Record [id number] Patient Account Number: [id number] Date of Birth/Sex: Treating RN: [birth year] (69 y.o. M) Primary Care Provider: PATIENT, NO Other Clinician: Referring Provider: Treating Provider/Extender:Gillian Kluever, [name], Designated Weeks in Treatment: 101 Constitutional Patient is hypertensive.. Pulse regular and within target range for patient.Marland Kitchen Respirations regular, non-labored and within target range.. Temperature is normal and within the target range for the patient.Marland Kitchen Appears in no  distress. Cardiovascular Pedal pulses are palpable. Integumentary (Hair, Skin) No evidence of infection around the wound. Notes Wound exam; the left lateral malleolus. Still 3 mm of depth but a lot of this is epithelialized. This curved slightly superiorly but I think there is only a small open area remaining here. No evidence of surrounding infection. The patient has severe chronic venous insufficiency with hemosiderin deposition He has scissor lacerations injuries from today's wrap removal superiorly which is superficial and a full-thickness skin injury at the medial aspect. Electronic Signature(s) Signed: 06/24/2019 5:54:58 PM By: Linton Ham MD Entered By: Linton Ham on 06/24/2019 10:59:16 -------------------------------------------------------------------------------- Physician Orders Details Patient Name: Date of Service: [name], [name] 06/24/2019 9:15 AM Medical Record [id number] Patient Account Number: [id number] Date of Birth/Sex: Treating RN: Oct 24, 1950 (69 y.o. 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 Dressing Change Frequency Wound #4 Left,Lateral Malleolus Do not change entire dressing for one week. Skin Barriers/Peri-Wound Care TCA Cream or Ointment - liberal to lower leg Wound Cleansing Wound #4 Left,Lateral Malleolus May shower with protection. Primary Wound  Dressing Wound #4 Left,Lateral Malleolus Skin Substitute Application - run insurance for approval for oasis burn matrix. Endoform - moisten with hydrogel cover with adaptic and steri-strips. Wound #7 Left,Distal,Medial Lower Leg Other: - steri-strips Wound #8 Left,Proximal,Medial Lower Leg Endoform Secondary Dressing Wound #4 Left,Lateral Malleolus Foam - foam donut. Dry Gauze Other: -  pad the left lateral and anterior portion of lower leg. Wound #7 Left,Distal,Medial Lower Leg Dry Gauze Wound #8 Left,Proximal,Medial Lower Leg Dry Gauze Edema Control 4 layer compression: Left lower extremity - 4 press - no cotton or kerlix layer Avoid standing for long periods of time Elevate legs to the level of the heart or above for 30 minutes daily and/or when sitting, a frequency of: - throughout the day Exercise regularly Electronic Signature(s) Signed: 06/24/2019 5:54:58 PM By: Linton Ham MD Signed: 06/24/2019 6:20:01 PM By: [name] Entered By: [name] on 06/24/2019 10:36:29 -------------------------------------------------------------------------------- Problem List Details Patient Name: Date of Service: [name], [name] 06/24/2019 9:15 AM Medical Record [id number] Patient Account Number: [id number] Date of Birth/Sex: Treating RN: 1951/04/13 (69 y.o. [name] Primary Care Provider: PATIENT, NO Other Clinician: Referring Provider: Treating Provider/Extender:Harrison Paulson, [name], Designated Weeks in Treatment: 30 Active Problems ICD-10 Evaluated Encounter Code Description Active Date Today Diagnosis I87.332 Chronic venous hypertension (idiopathic) with ulcer 11/20/2018 No Yes and inflammation of left lower extremity L97.321 Non-pressure chronic ulcer of left ankle limited to 11/20/2018 No Yes breakdown of skin M86.472 Chronic osteomyelitis with draining sinus, left ankle 06/18/2019 No Yes and foot L97.228 Non-pressure chronic ulcer of left calf with  other 06/24/2019 No Yes specified severity Inactive Problems ICD-10 Code Description Active Date Inactive Date C44.799 Other specified malignant neoplasm of skin of left lower limb, 11/27/2018 11/27/2018 including hip L97.211 Non-pressure chronic ulcer of right calf limited to breakdown of 12/10/2018 12/10/2018 skin Resolved Problems Electronic Signature(s) Signed: 06/24/2019 5:54:58 PM By: Linton Ham MD Entered By: Linton Ham on 06/24/2019 10:55:47 -------------------------------------------------------------------------------- Progress Note Details Patient Name: Date of Service: [name] 06/24/2019 9:15 AM Medical Record [id number] Patient Account Number: [id number] Date of Birth/Sex: Treating RN: [birth year] (69 y.o. M) Primary Care Provider: PATIENT, NO Other Clinician: Referring Provider: Treating Provider/Extender:Elfida Shimada, [name], Designated Weeks in Treatment: 30 Subjective History of Present Illness (HPI) 08/22/17-He is here for initial evaluation of a right heel wound, right toe wound and left heel fissures. He states that he has chronic dry, flaking skin and frequently has cracked heels. He states the right heel wound developed as such but he developed the wound approximately 3 weeks ago when pulling some skin off; he was unaware of the right toe ulcer. He was treating the right heel with over-the-counter triple antibiotic ointment and peroxide. He presented to urgent care on 3/12 with a 2 x 2 centimeter ulceration to the right heel and was discharged with a 10 day prescription for Bactrim. He does have a history of neuropathy, diet controlled diabetic. His does not know his most recent A1c but will be seeing his PCP next week, there is no evidence of an A1c in Epic. He denies smoking, does have a history of alcohol use, he admits to 5+/- beers weekly not daily. He did follow-up with cardiology on 3/18 with c/o ble edema, they ordered DVT study which  he completed on 3/21. He reports that the bilateral DVT study was negative per the ultrasound technician, no report is available at this time. READMISSION 11/20/2018 this is a now 69 year old man who is here for review of wounds on his lower extremity on the left. We note that he was here on a single visit in March he tells [place] that over the last 2019. His major issue is that he dropped a box and hit the outside of his left lateral malleolus about 3 weeks ago. This is left him with a nonhealing  area. He has been using Iodosorb ointment he had apparently from the last stay in this clinic. Also of note over the last 6 weeks he has noted an area that is raised and will scab over but then he traumatizes a scab and then this bleeds and reopens. This is on the left posterior calf. All of this appears to be on the background of chronic venous insufficiency. He has had DVT studies in the past that were negative in 2019. He is also had arterial studies that showed an ABI on the right of 0.76 and on the left of 1.05. Past medical history; the patient states that he is not a diabetic although I note there is some suggested he was in the note from last year. He has a history of coronary artery disease. He tells me that he has had a history of skin cancer but he is not sure which one but that includes one on the left upper thigh. ABIs done previously 0.76 on the right and 1.05 on the left 6/26; patient readmitted to the clinic last week. He had a traumatic area over the left lateral malleolus as well as a hyper granulated growth on the left posterior calf. He also has severe chronic venous insufficiency. 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 We have been using Iodoflex to the traumatic wound under compression. 7/9; area over the left lateral malleolus continues to require debridement and we are continuing with Iodoflex under compression. He has severe  chronic venous insufficiency. 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 This patient has severe bilateral venous insufficiency and venous hypertension with skin changes resulting. He requires bilateral reflux studies Finally he traumatized his right lateral calf while doing yard work about a week ago. The area is small with a flap of skin over the majority of the wound but I am not sure this is going to remain viable 7/16; still not a viable surface on the left lateral malleolus in fact this wound is deeper. We have been using Iodoflex. 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 He has severe bilateral venous insufficiency and I have ordered reflux studies these apparently are booked for next week. Our intake nurse noted some yellowish-green drainage coming from the wound on the left ankle. Patient was insistent on talking about doing carotid ultrasounds and he was hoping to get these done with his reflux studies next week. I spent a few minutes talking to him about this I just could not put together in a logical way what he is concerned about. At the end of this I asked him to see his primary doctor about this he says he does not have a primary doctor advised him to get a primary doctor to go over this with him. It was not really clear where the level of concern was specific to carotid ultrasound 7/23; a better looking surface on the left lateral malleolus although it still requires debridement. He tells me he has an appointment to [facility] surgery center next week. Small area on the right lateral calf. The patient went to [facility] I do not think they  took his compression wraps off. He did have reflux on the left in the  common femoral greater saphenous and saphenofemoral junction and the great saphenous vein in the proximal thigh. 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 Looking at the numbers it would appear that the maximal reflux was in the great saphenous vein in the proximal thigh. As usual I am not sure what would be possible to do here. I will send him to vascular surgery 7/30; the patient still requires debridement in the left lateral malleolus although in general the wound is still deep and and punched out. We are using Sorbact. The area on the right lateral calf is improved. 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 The patient does not have a vascular surgery appointment nor does he have a skin surgery center appointment. I am really not sure what the issues are here although I know vascular surgery as well behind. 8/6-Patient does not have a vascular appointment nor does he have a skin surgery appointment firmed up at this time although he says he is called them both, impressed upon him that he should see whoever is available for the vascular so the vein studies can be interpreted. 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 He is here for the left lateral malleoli wound for which we are using SOrbact, and he is disappointed the wound is not healing 8/21; patient saw Dr. Doren Custard on 01/21/2019. Dr. Doren Custard was concerned about his  arterial flow. Noting that he had trouble feeling the dorsalis pedis and posterior tibial arteries also noteworthy that the Doppler had a barely biphasic posterior tibial signal with a monophasic but fairly brisk dorsalis pedis signal. He is booked for an angiogram next Friday. We have been using 3 layer compression on him. 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 Given Dr. Mee Hives concern it was difficult for me to be comfortable with him going ahead with the cancer extraction and I have suggested canceling this. Finally he has an increase in the erythematous contact dermatitis in the entire wrapped area of the left leg which is probably an allergy to the contact layer of the 3 layer. 8/27; he is going for his angiogram tomorrow by Dr. Doren Custard. We cancel the Mohs surgery on the tumor on his posterior calf pending the angiogram tomorrow. If this shows adequate blood flow to this area I think we can rebook this. His main wound is on the left lateral malleolus a small refractory punched-out area. We have been using Hydrofera Blue 9/3; his angiogram was done as scheduled. He does indeed have PAD. The common femoral, deep femoral superficial femoral and popliteal arteries will are all widely patent. There was single-vessel runoff on the left via the posterior tibial artery that had 1 focal area with perhaps 40 to 50% stenosis the anterior tibial and peroneal  arteries are occluded he had good runoff into the foot and plantar arch. The overall thought was that he had enough adequate circulation to heal his venous ulcer and continue to use mild elevation and compression. Noted that he had biphasic posterior tibial signals with a Doppler ABI of 0.91. It was not felt that the stenosis in the proximal tibial artery would be associated with increased blood flow he will follow-up with Dr. Doren Custard in 1 month He now needs to get the skin surgery  appointment set up. The area is growing larger and bleeding frequently on the posterior left calf per the patient 02/11/19-Patient is back after 1 week, results of his vascular studies are noted, patient needs to have a skin surgery appointment established apparently he did not return the calls this week. The area on the posterior calf is where the skin surgery needs to be done, the left lateral malleoli are wound appears to be about the same we will using Hydrofera Blue change 9/24; patient finally has a booking to deal with the cancer on his posterior calf I believe with the skin surgery center. Not much change in the wound over the left lateral malleolus. It is been 2 weeks since he was here. I changed him to endoform last time 10/2; the patient has been to see dermatology. Scheduled for surgery on 10/14. Using endoform to the wound over the lateral malleolus some improvement in the wound condition but not the surface area. He has severe chronic venous insufficiency. He also has some arterial disease he was angiogram by Dr. Doren Custard. He did not think that anything needed to be done from an intervention point of view. 10/8; his surgery is still scheduled for 10/14. It is concerning about whether he is healed or maintained skin integrity and is sutured area given the severity of his chronic venous insufficiency. The area we are following is on the left lateral malleolus. Some improvement in the surface area. No debridement today we have been using endoform 10/15; the patient finally had his surgery for the tumor on his left posterior calf. We did not look at this today. Still a punched out area in the lower left malleolus. Perhaps some mild improvement we have been using endoform We were denied for Grafix and still have not heard back on Oasis 10/22; no real change in this wound. I am able to debride it to a healthy looking surface but the next week there is recurrent debris and no improvement in  depth. Punched out over the left lateral malleolus. We have not heard back on Oasis. We were denied for Grafix 11/6; wound is perhaps slightly smaller. Still punched out. He was approved for Oasis we applied Oasis #1. His surgical wound on the back of the left calf was reviewed by dermatology we are not reviewing this 11/13; I think the wound is filled in somewhat. Certainly a much more vibrant looking surface. Oasis #2. I looked at his surgical site on the back of the calf today for the first time. Clean incision small open areas 11/19; measurements not much different today. Still 0.4 cm in depth although post debridement the wound surface looks better. Oasis #3 the site on the back of his calf looks satisfactory. I put him in 4 layer compression last week he seems to tolerate this. He is complaining of some tenderness around the wound. This was controlled swelling around the wound. He seems to have tolerated this 04/28/2019 on evaluation today patient appears to be  doing slightly better based on measurements compared to last week. He is here for Oasis application #4 to the ankle region. Fortunately there is no signs of other wounds open at this point and no signs of infection. 12/3; Oasis #5. We have a better looking wound surface but not much change in depth at 0.6 cm. 12/11; Oasis #6 again some improvement this week better looking surface. Perhaps some improvement in depth 12/17 Oasis number 7.3 cm in depth 05/26/2019 patient is here for Oasis application #8 in regard to his wound. He is showing evidence of some improvement right now with the Oasis. He did also go for his x-ray today although the results are not yet available in epic for review. 12/30- Patient returns at 1 week for Oasis application #9 with regard to his left lower extremity foot wound, which is showing some evidence of improvement, the x-ray that was done last week did show slight periosteal elevation and therefore he would  be in line to get an MRI to confirm these findings 06/10/2019; the x-ray that the patient had done showed cortical irregularity and suggested an MRI. An MRI was ordered when he was here I believe on 12/23 but we still do not have an appointment for this. I did 9 Oasis on this man and really the improvement is quite dramatic however it is still not closed but certainly a lot better looking than say 5 to 6 weeks ago. The patient is fixated on Apligraf. I told him that I will not consider ordering this until the MRI is done. Today we put endoform on the wound 1/15; the MRI that I ordered was finally done. Unfortunately it actually posed a difficult question rather than answering it. It was felt that there was subtle periosteal reaction and minimal focal edema in the lateral malleolus. The major differential would be reactive findings rather than necessarily representing early active osteomyelitis. Given the original depth of this wound however I do not know how I can exclude the osteomyelitis and the consequences of not treating this may be disastrous. 1/21; the patient arrives in clinic today unfortunately had scissor injuries to his upper left medial and mid calf. The worst one here is the mid calf. His wound actually continues to contract. He is taking Levaquin which I am trying to get into him for a month. He is says he is having 1-2 loose stools a day but this does not appear to be watery diarrhea. He is also strongly requesting another round of Oasis. This is after I managed to talk him out of Apligraf. Frankly the wound is too small for an Apligraf now Objective Constitutional Patient is hypertensive.. Pulse regular and within target range for patient.Marland Kitchen Respirations regular, non-labored and within target range.. Temperature is normal and within the target range for the patient.Marland Kitchen Appears in no distress. Vitals Time Taken: 10:00 AM, Height: 73 in, Weight: 205 lbs, BMI: 27, Temperature: 98.2 F,  Pulse: 73 bpm, Respiratory Rate: 18 breaths/min, Blood Pressure: 196/111 mmHg. Cardiovascular Pedal pulses are palpable. General Notes: Wound exam; the left lateral malleolus. Still 3 mm of depth but a lot of this is epithelialized. This curved slightly superiorly but I think there is only a small open area remaining here. No evidence of surrounding infection. The patient has severe chronic venous insufficiency with hemosiderin deposition ooHe has scissor lacerations injuries from today's wrap removal superiorly which is superficial and a full-thickness skin injury at the medial aspect. Integumentary (Hair, Skin) No evidence of  infection around the wound. Wound #4 status is Open. Original cause of wound was Trauma. The wound is located on the Left,Lateral Malleolus. The wound measures 0.5cm length x 0.4cm width x 0.3cm depth; 0.157cm^2 area and 0.047cm^3 volume. There is Fat Layer (Subcutaneous Tissue) Exposed exposed. There is no tunneling or undermining noted. There is a medium amount of serous drainage noted. The wound margin is well defined and not attached to the wound base. There is large (67-100%) pink granulation within the wound bed. There is a small (1-33%) amount of necrotic tissue within the wound bed including Adherent Slough. General Notes: 2 small skin tears on medial aspect of leg caused during removal of compression wrap, MD notified Wound #7 status is Open. Original cause of wound was Trauma. The wound is located on the Left,Distal,Medial Lower Leg. The wound measures 1.5cm length x 0.5cm width x 0.1cm depth; 0.589cm^2 area and 0.059cm^3 volume. There is no tunneling or undermining noted. There is a medium amount of serosanguineous drainage noted. The wound margin is distinct with the outline attached to the wound base. There is large (67-100%) red granulation within the wound bed. There is no necrotic tissue within the wound bed. Wound #8 status is Open. Original cause of  wound was Trauma. The wound is located on the Left,Proximal,Medial Lower Leg. The wound measures 0.5cm length x 0.5cm width x 0.1cm depth; 0.196cm^2 area and 0.02cm^3 volume. There is no tunneling or undermining noted. There is a medium amount of serosanguineous drainage noted. The wound margin is distinct with the outline attached to the wound base. There is large (67-100%) red granulation within the wound bed. There is no necrotic tissue within the wound bed. 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 There was a Four Layer Compression Therapy Procedure by Baruch Gouty, RN. Post procedure Diagnosis Wound #4: Same as Pre-Procedure Notes: no kerlix or cotton layer patient request.. Plan Follow-up Appointments: Return Appointment in 1 week. Dressing Change Frequency: Wound #4 Left,Lateral Malleolus: Do not change entire dressing for one week. Skin Barriers/Peri-Wound Care: TCA Cream or Ointment - liberal to lower leg Wound Cleansing: Wound #4 Left,Lateral Malleolus: May shower with protection. Primary Wound Dressing: Wound #4 Left,Lateral Malleolus: Skin Substitute Application - run insurance for approval for oasis burn matrix. Endoform - moisten with hydrogel cover with adaptic and steri-strips. Wound #7 Left,Distal,Medial Lower Leg: Other: - steri-strips Wound #8 Left,Proximal,Medial Lower Leg: Endoform Secondary Dressing: Wound #4 Left,Lateral Malleolus: Foam - foam donut. Dry Gauze Other: - pad the left lateral and anterior portion of lower leg. Wound #7  Left,Distal,Medial Lower Leg: Dry Gauze Wound #8 Left,Proximal,Medial Lower Leg: Dry Gauze Edema Control: 4 layer compression: Left lower extremity - 4 press - no cotton or kerlix layer Avoid standing for long periods of time Elevate legs to the level of the heart or above for 30 minutes daily and/or when sitting, a frequency of: - throughout the day Exercise regularly 1. I am continuing with endoform to the primary wound area. I will put Oasis through his insurance but I am doubtful we will have to use this in spite of the patient's insistence 2. We will put endoform to  the superficial scissor injuries on the medial calf. We have Steri-Stripped the middle one which is full-thickness. 3. Put him back in compression 4. After discussion which was at times almost circumferential it seems that he is probably tolerating the Levaquin. He asked about rationale for using Levaquin I explained 100% absorption and good bone penetration. Electronic Signature(s) Signed: 06/24/2019 5:54:58 PM By: Linton Ham MD Entered By: Linton Ham on 06/24/2019 11:00:43 -------------------------------------------------------------------------------- SuperBill Details Patient Name: Date of Service: [name], [name] 06/24/2019 Medical Record [id number] Patient Account Number: [id number] Date of Birth/Sex: Treating RN: 03/10/1951 (69 y.o. 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, including hip M86.472 Chronic osteomyelitis with draining sinus, left ankle and foot Facility  Procedures CPT4 Code Description: [id number] (Facility Use Only) [place] LWR LT LEG Modifier: Quantity: 1 Physician Procedures CPT4 Code Description: [id number] - WC PHYS LEVEL 3 - EST PT ICD-10 Diagnosis Description L97.321 Non-pressure chronic ulcer of left ankle limited to break C44.799 Other specified malignant neoplasm of skin of left lower Modifier: down of skin limb, including Quantity: 1 hip Electronic Signature(s) Signed: 06/24/2019 5:54:58 PM By: Linton Ham MD Entered By: Linton Ham on 06/24/2019 11:01:13

## 2019-06-28 NOTE — Progress Notes (Signed)
[name], [name] ([id number]) Visit Report for 06/24/2019 Arrival Information Details Patient Name: Date of Service: [name], [name] 06/24/2019 9:15 AM Medical Record [id number] Patient Account Number: [id number] Date of Birth/Sex: Treating RN: [birth year] (69 y.o. [name] Primary Care [name]: PATIENT, NO Other Clinician: Referring Karely Hurtado: Treating [name]/Extender:[name], Designated Weeks in Treatment: 30 Visit Information History Since Last Visit Added or deleted any medications: No Patient Arrived: [name] Any new allergies or adverse reactions: No Arrival Time: 09:52 Had a fall or experienced change in No Accompanied By: alone activities of daily living that may affect Transfer Assistance: None risk of falls: Patient Identification Verified: Yes Signs or symptoms of abuse/neglect since last No Secondary Verification Process Yes visito Completed: Hospitalized since last visit: No Patient Requires Transmission- No Implantable device outside of the clinic excluding No Based Precautions: cellular tissue based products placed in the center Patient Has Alerts: Yes ABIs 08/2018 L1.05 since last visit: Patient Alerts: Has Dressing in Place as Prescribed: Yes R0.76 Has Compression in Place as Prescribed: Yes Pain Present Now: Yes Electronic Signature(s) Signed: 06/28/2019 6:46:58 PM By: Michael Hurst RN, [name] Entered By: [name] on 06/24/2019 09:57:43 -------------------------------------------------------------------------------- Compression Therapy Details Patient Name: Date of Service: [name] 06/24/2019 9:15 AM Medical Record [id number] Patient Account Number: [id number] Date of Birth/Sex: Treating RN: 1950-09-07 (69 y.o. [name] Primary Care Lavonte Palos: PATIENT, NO Other Clinician: Referring Dawnell Bryant: Treating Renatta Shrieves/Extender:[name], Designated Weeks in Treatment: 30 Compression  Therapy Performed for Wound Wound #4 Left,Lateral Malleolus Assessment: Performed By: Clinician Michael Gouty, RN Compression Type: Four Layer Post Procedure Diagnosis Same as Pre-procedure Notes no kerlix or cotton layer patient request. Electronic Signature(s) Signed: 06/24/2019 6:20:01 PM By: [name] Entered By: [name] on 06/24/2019 10:27:21 -------------------------------------------------------------------------------- Encounter Discharge Information Details Patient Name: Date of Service: [name], [name] 06/24/2019 9:15 AM Medical Record [id number] Patient Account Number: [id number] Date of Birth/Sex: Treating RN: 01-Jan-1951 (69 y.o. [name] Primary Care Durant Scibilia: PATIENT, NO Other Clinician: Referring Orland Visconti: Treating Cricket Goodlin/Extender:[name], Designated Weeks in Treatment: 30 Encounter Discharge Information Items Discharge Condition: Stable Ambulatory Status: Cane Discharge Destination: Home Transportation: Private Auto Accompanied By: self Schedule Follow-up Appointment: Yes Clinical Summary of Care: Patient Declined Electronic Signature(s) Signed: 06/24/2019 5:58:49 PM By: [name] Entered By: [name] on 06/24/2019 11:02:16 -------------------------------------------------------------------------------- Lower Extremity Assessment Details Patient Name: Date of Service: [name], [name] 06/24/2019 9:15 AM Medical Record [id number] Patient Account Number: [id number] Date of Birth/Sex: Treating RN: [birth year] (69 y.o. [name] Primary Care Mischa Brittingham: PATIENT, NO Other Clinician: Referring Dquan Cortopassi: Treating Sakeena Teall/Extender:[name], Designated Weeks in Treatment: 30 Edema Assessment Assessed: [Left: No] [Right: No] Edema: [Left: N] [Right: o] Calf Left: Right: Point of Measurement: 35 cm From Medial Instep 32 cm cm Ankle Left: Right: Point of Measurement: 9 cm  From Medial Instep 22 cm cm Vascular Assessment Pulses: Dorsalis Pedis Palpable: [Left:Yes] Electronic Signature(s) Signed: 06/28/2019 6:46:58 PM By: Michael Hurst RN, [name] Entered By: [name] on 06/24/2019 10:07:27 -------------------------------------------------------------------------------- Multi Wound Chart Details Patient Name: Date of Service: [name] 06/24/2019 9:15 AM Medical Record [id number] Patient Account Number: [id number] Date of Birth/Sex: Treating RN: 1951-03-09 (69 y.o. M) Primary Care Chai Routh: PATIENT, NO Other Clinician: Referring Tarra Pence: Treating Kemani Demarais/Extender:[name], Designated Weeks in Treatment: 30 Vital Signs Height(in): 73 Pulse(bpm): 53 Weight(lbs): 205 Blood Pressure(mmHg): 196/111 Body Mass Index(BMI): 27 Temperature(F): 98.2 Respiratory 18 Rate(breaths/min): Photos: [4:No Photos] [7:No Photos] [8:No Photos] Wound Location: [4:Left  Malleolus - Lateral] [7:Left Lower Leg - Medial, Distal] [8:Left Lower Leg - Medial, Proximal] Wounding Event: [4:Trauma] [7:Trauma] [8:Trauma] Primary Etiology: [4:Venous Leg Ulcer] [7:Skin Tear] [8:Skin Tear] Comorbid History: [4:Arrhythmia, Coronary Artery Arrhythmia, Coronary Artery Arrhythmia, Coronary Artery Disease, Type II Diabetes Disease, Type II Diabetes Disease, Type II Diabetes] Date Acquired: [4:11/02/2018] [7:06/24/2019] [8:06/24/2019] Weeks of Treatment: [4:30] [7:0] [8:0] Wound Status: [4:Open] [7:Open] [8:Open] Measurements L x W x D 0.5x0.4x0.3 [7:1.5x0.5x0.1] [8:0.5x0.5x0.1] (cm) Area (cm) : [4:0.157] [7:0.589] [8:0.196] Volume (cm) : [4:0.047] [7:0.059] [8:0.02] % Reduction in Area: [4:87.90%] [7:N/A] [8:N/A] % Reduction in Volume: [4:63.80%] [7:N/A] [8:N/A] Classification: [4:Full Thickness Without Exposed Support Structures Exposed Support Structures Exposed Support Structures] [7:Full Thickness Without] [8:Full Thickness Without] Exudate Amount:  [4:Medium] [7:Medium] [8:Medium] Exudate Type: [4:Serous] [7:Serosanguineous] [8:Serosanguineous] Exudate Color: [4:amber] [7:red, brown] [8:red, brown] Wound Margin: [4:Well defined, not attached Distinct, outline attached Distinct, outline attached] Granulation Amount: [4:Large (67-100%)] [7:Large (67-100%)] [8:Large (67-100%)] Granulation Quality: [4:Pink] [7:Red] [8:Red] Necrotic Amount: [4:Small (1-33%)] [7:None Present (0%)] [8:None Present (0%)] Exposed Structures: [4:Fat Layer (Subcutaneous Fascia: No Tissue) Exposed: Yes Fascia: No Tendon: No Muscle: No Joint: No Bone: No] [7:Fat Layer (Subcutaneous Fat Layer (Subcutaneous Tissue) Exposed: No Tendon: No Muscle: No Joint: No Bone: No] [8:Fascia: No Tissue)  Exposed: No Tendon: No Muscle: No Joint: No Bone: No] Epithelialization: [4:Small (1-33%)] [7:None] [8:None] Assessment Notes: [4:2 small skin tears on medial N/A aspect of leg caused during removal of compression wrap, MD notified Compression Therapy] [7:N/A] [8:N/A N/A] Treatment Notes Electronic Signature(s) Signed: 06/24/2019 5:54:58 PM By: [name], Michael MD Entered By: [name], Michael on 06/24/2019 10:55:54 -------------------------------------------------------------------------------- Multi-Disciplinary Care Plan Details Patient Name: Date of Service: [name], Michael F. 06/24/2019 9:15 AM Medical Record Number:[id number] Patient Account Number: [id number] Date of Birth/Sex: Treating RN: 08/18/1950 (68 y.o. M) Deaton, Bobbi Primary Care Provider: PATIENT, NO Other Clinician: Referring Provider: Treating Provider/Extender:[name], Michael None, Designated Weeks in Treatment: 30 Active Inactive Venous Leg Ulcer Nursing Diagnoses: Knowledge deficit related to disease process and management Potential for venous Insuffiency (use before diagnosis confirmed) Goals: Patient will maintain optimal edema control Date Initiated: 11/20/2018 Target Resolution Date: 08/19/2019 Goal  Status: Active Patient/caregiver will verbalize understanding of disease process and disease management Date Initiated: 11/20/2018 Date Inactivated: 12/17/2018 Target Resolution Date: 12/18/2018 Goal Status: Met Interventions: Assess peripheral edema status every visit. Compression as ordered Provide education on venous insufficiency Treatment Activities: Therapeutic compression applied : 11/20/2018 Notes: Electronic Signature(s) Signed: 06/24/2019 6:20:01 PM By: Deaton, Bobbi Entered By: Deaton, Bobbi on 06/24/2019 09:58:06 -------------------------------------------------------------------------------- Pain Assessment Details Patient Name: Date of Service: [name], Michael F. 06/24/2019 9:15 AM Medical Record Number:[id number] Patient Account Number: [id number] Date of Birth/Sex: Treating RN: 02/21/1951 (68 y.o. M) Lynch, Shatara Primary Care Provider: PATIENT, NO Other Clinician: Referring Provider: Treating Provider/Extender:[name], Michael None, Designated Weeks in Treatment: 30 Active Problems Location of Pain Severity and Description of Pain Patient Has Paino Yes Site Locations Pain Location: Pain in Ulcers With Dressing Change: Yes Duration of the Pain. Constant / Intermittento Intermittent Rate the pain. Current Pain Level: 4 Character of Pain Describe the Pain: Throbbing Pain Management and Medication Current Pain Management: Medication: Yes Cold Application: No Rest: No Massage: No Activity: No T.E.N.S.: No Heat Application: No Leg drop or elevation: No Is the Current Pain Management Adequate: Adequate Electronic Signature(s) Signed: 06/28/2019 6:46:58 PM By: Lynch, Shatara RN, [name] Entered By: Lynch, Shatara on 06/24/2019 09:56:28 -------------------------------------------------------------------------------- Patient/Caregiver Education Details Patient Name: Date of Service: [name], Michael F. 1/21/2021andnbsp9:15 AM Medical Record Number:[id number]    Patient Account Number: [id number] Date of Birth/Gender: Treating RN: 10/17/1950 (68 y.o. M) Deaton, Bobbi Primary Care Physician: PATIENT, NO Other Clinician: Referring Physician: Treating Physician/Extender:[name], Michael None, Designated Weeks in Treatment: 30 Education Assessment Education Provided To: Patient Education Topics Provided Venous: Handouts: Managing Venous Disease and Related Ulcers Methods: Explain/Verbal Responses: Reinforcements needed Electronic Signature(s) Signed: 06/24/2019 6:20:01 PM By: Deaton, Bobbi Entered By: Deaton, Bobbi on 06/24/2019 09:58:32 -------------------------------------------------------------------------------- Wound Assessment Details Patient Name: Date of Service: [name], Michael F. 06/24/2019 9:15 AM Medical Record Number:[id number] Patient Account Number: [id number] Date of Birth/Sex: Treating RN: 07/03/1950 (68 y.o. M) Lynch, Shatara Primary Care Liandro Thelin: PATIENT, NO Other Clinician: Referring Sheza Strickland: Treating Gayland Nicol/Extender:[name], Michael None, Designated Weeks in Treatment: 30 Wound Status Wound Number: 4 Primary Venous Leg Ulcer Etiology: Wound Location: Left Malleolus - Lateral Wound Open Wounding Event: Trauma Status: Date Acquired: 11/02/2018 Comorbid Arrhythmia, Coronary Artery Disease, Weeks Of Treatment: 30 History: Type II Diabetes Clustered Wound: No Photos Wound Measurements Length: (cm) 0.5 % Reduc Width: (cm) 0.4 % Reduc Depth: (cm) 0.3 Epithel Area: (cm) 0.157 Tunnel Volume: (cm) 0.047 Underm Wound Description Classification: Full Thickness Without Exposed Support Foul Od Structures Slough/ Wound Well defined, not attached Margin: Exudate Medium Amount: Exudate Serous Type: Exudate amber Color: Wound Bed Granulation Amount: Large (67-100%) Granulation Quality: Pink Fascia Necrotic Amount: Small (1-33%) Fat Lay Necrotic Quality: Adherent Slough Tendon Muscle Joint E Bone Ex or  After Cleansing: No Fibrino Yes Exposed Structure Exposed: No er (Subcutaneous Tissue) Exposed: Yes Exposed: No Exposed: No xposed: No posed: No tion in Area: 87.9% tion in Volume: 63.8% ialization: Small (1-33%) ing: No ining: No Assessment Notes 2 small skin tears on medial aspect of leg caused during removal of compression wrap, MD notified Electronic Signature(s) Signed: 06/24/2019 4:35:12 PM By: Jones, Dedrick EMT/HBOT Signed: 06/28/2019 6:46:58 PM By: Lynch, Shatara RN, [name] Entered By: Jones, Dedrick on 06/24/2019 14:11:22 -------------------------------------------------------------------------------- Wound Assessment Details Patient Name: Date of Service: [name], Michael F. 06/24/2019 9:15 AM Medical Record Number:[id number] Patient Account Number: [id number] Date of Birth/Sex: Treating RN: 10/18/1950 (68 y.o. M) Deaton, Bobbi Primary Care Jayant Kriz: PATIENT, NO Other Clinician: Referring Asmaa Tirpak: Treating Emmah Bratcher/Extender:[name], Michael None, Designated Weeks in Treatment: 30 Wound Status Wound Number: 7 Primary Skin Tear Etiology: Wound Location: Left Lower Leg - Medial, Distal Wound Open Wounding Event: Trauma Status: Date Acquired: 06/24/2019 Comorbid Arrhythmia, Coronary Artery Disease, Weeks Of Treatment: 0 History: Type II Diabetes Clustered Wound: No Photos Wound Measurements Length: (cm) 1.5 % Reduct Width: (cm) 0.5 % Reduct Depth: (cm) 0.1 Epitheli Area: (cm) 0.589 Tunneli Volume: (cm) 0.059 Undermi Wound Description Full Thickness Without Exposed Support Foul Odo Classification: Structures Slough/F Wound Distinct, outline attached Margin: Exudate Medium Amount: Exudate Serosanguineous Type: Exudate red, brown Color: Wound Bed Granulation Amount: Large (67-100%) Granulation Quality: Red Fascia E Necrotic Amount: None Present (0%) Fat Laye Tendon E Muscle E Joint Ex Bone Exp Treatment Notes Wound #7 (Left, Distal, Medial Lower  Leg) 2. Periwound Care Moisturizing lotion TCA Cream 3. Primary Dressing Applied Endoform 4. Secondary Dressing Dry Gauze Foam Other secondary dressing (specify in notes) 6. Support Layer Applied 4 layer compression wrap r After Cleansing: No ibrino No Exposed Structure xposed: No r (Subcutaneous Tissue) Exposed: No xposed: No xposed: No posed: No osed: No ion in Area: 0% ion in Volume: 0% alization: None ng: No ning: No Notes steristrips Electronic Signature(s) Signed: 06/24/2019 4:35:12 PM By: Jones, Dedrick EMT/HBOT Signed: 06/24/2019 6:20:01 PM By: Deaton, Bobbi Entered By: Jones, Dedrick on 06/24/2019   14:37:19 -------------------------------------------------------------------------------- Wound Assessment Details Patient Name: Date of Service: [name], Michael F. 06/24/2019 9:15 AM Medical Record Number:[id number] Patient Account Number: [id number] Date of Birth/Sex: Treating RN: 12/29/1950 (68 y.o. M) Deaton, Bobbi Primary Care Provider: PATIENT, NO Other Clinician: Referring Provider: Treating Provider/Extender:[name], Michael None, Designated Weeks in Treatment: 30 Wound Status Wound Number: 8 Primary Skin Tear Etiology: Wound Location: Left Lower Leg - Medial, Proximal Wound Open Wounding Event: Trauma Status: Date Acquired: 06/24/2019 Comorbid Arrhythmia, Coronary Artery Disease, Weeks Of Treatment: 0 History: Type II Diabetes Clustered Wound: No Photos Wound Measurements Length: (cm) 0.5 % Reduct Width: (cm) 0.5 % Reduct Depth: (cm) 0.1 Epitheli Area: (cm) 0.196 Tunneli Volume: (cm) 0.02 Undermi Wound Description Full Thickness Without Exposed Support Foul Odo Classification: Structures Slough/F Wound Distinct, outline attached Margin: Exudate Medium Amount: Exudate Serosanguineous Type: Exudate red, brown Color: Wound Bed Granulation Amount: Large (67-100%) Granulation Quality: Red Fascia E Necrotic Amount: None Present (0%) Fat  Laye Tendon E Muscle E Joint Ex Bone Exp r After Cleansing: No ibrino No Exposed Structure xposed: No r (Subcutaneous Tissue) Exposed: No xposed: No xposed: No posed: No osed: No ion in Area: 0% ion in Volume: 0% alization: None ng: No ning: No Treatment Notes Wound #8 (Left, Proximal, Medial Lower Leg) 2. Periwound Care Moisturizing lotion TCA Cream 3. Primary Dressing Applied Endoform 4. Secondary Dressing Dry Gauze Foam Other secondary dressing (specify in notes) 6. Support Layer Applied 4 layer compression wrap Notes steristrips Electronic Signature(s) Signed: 06/24/2019 4:35:12 PM By: Jones, Dedrick EMT/HBOT Signed: 06/24/2019 6:20:01 PM By: Deaton, Bobbi Entered By: Jones, Dedrick on 06/24/2019 14:36:57 -------------------------------------------------------------------------------- Vitals Details Patient Name: Date of Service: [name], Michael F. 06/24/2019 9:15 AM Medical Record Number:[id number] Patient Account Number: [id number] Date of Birth/Sex: Treating RN: 04/13/1951 (68 y.o. M) Lynch, Shatara Primary Care Provider: Other Clinician: PATIENT, NO Referring Provider: Treating Provider/Extender:[name], Michael None, Designated Weeks in Treatment: 30 Vital Signs Time Taken: 10:00 Temperature (°F): 98.2 Height (in): 73 Pulse (bpm): 73 Weight (lbs): 205 Respiratory Rate (breaths/min): 18 Body Mass Index (BMI): 27 Blood Pressure (mmHg): 196/111 Reference Range: 80 - 120 mg / dl Electronic Signature(s) Signed: 06/28/2019 6:46:58 PM By: Lynch, Shatara RN, [name] Entered By: Lynch, Shatara on 06/24/2019 10:01:55 

## 2019-06-30 ENCOUNTER — Ambulatory Visit: Payer: Medicare Other

## 2019-07-01 ENCOUNTER — Other Ambulatory Visit: Payer: Self-pay

## 2019-07-01 ENCOUNTER — Encounter (HOSPITAL_BASED_OUTPATIENT_CLINIC_OR_DEPARTMENT_OTHER): Payer: Medicare Other | Attending: Internal Medicine | Admitting: Internal Medicine

## 2019-07-01 DIAGNOSIS — I87332 Chronic venous hypertension (idiopathic) with ulcer and inflammation of left lower extremity: Secondary | ICD-10-CM | POA: Diagnosis not present

## 2019-07-01 NOTE — Progress Notes (Signed)
AADIT, HORNSBY ([id number]) Visit Report for 07/01/2019 Debridement Details Patient Name: [name], [name]. Date of Service: 07/01/2019 11:00 AM Medical Record [id number] Patient Account Number: [id number] Date of Birth/Sex: 31-Jan-1951 (69 y.o. M) Treating RN: Primary Care Provider: PATIENT, NO Other Clinician: Referring Provider: Treating Provider/Extender:Tacari Repass, [name], Designated Weeks in Treatment: 31 Debridement Performed for Wound #4 Left,Lateral Malleolus Assessment: Performed By: Physician Ricard Dillon., MD Debridement Type: Debridement Severity of Tissue Pre Fat layer exposed Debridement: Level of Consciousness (Pre- Awake and Alert procedure): Pre-procedure Verification/Time Out Taken: Yes - 11:50 Start Time: 11:51 Pain Control: Lidocaine 4% Topical Solution Total Area Debrided (L x W): 0.9 (cm) x 0.9 (cm) = 0.81 (cm) Tissue and other material Viable, Non-Viable, Slough, Subcutaneous, Skin: Dermis , Fibrin/Exudate, Slough debrided: Level: Skin/Subcutaneous Tissue Debridement Description: Excisional Instrument: Curette Bleeding: Minimum Hemostasis Achieved: Pressure End Time: 11:53 Procedural Pain: 0 Post Procedural Pain: 0 Response to Treatment: Procedure was tolerated well Level of Consciousness Awake and Alert (Post-procedure): Post Debridement Measurements of Total Wound Length: (cm) 0.9 Width: (cm) 0.9 Depth: (cm) 0.4 Volume: (cm) 0.254 Character of Wound/Ulcer Post Improved Debridement: Severity of Tissue Post Debridement: Fat layer exposed Post Procedure Diagnosis Same as Pre-procedure Electronic Signature(s) Signed: 07/01/2019 5:54:40 PM By: Linton Ham MD Entered By: Linton Ham on 07/01/2019 13:04:54 -------------------------------------------------------------------------------- HPI Details Patient Name: Date of Service: [name] 07/01/2019 11:00 AM Medical Record [id number] Patient Account  Number: [id number] Date of Birth/Sex: Treating RN: 1950/11/21 (69 y.o. M) Primary Care Provider: PATIENT, NO Other Clinician: Referring Provider: Treating Provider/Extender:Chrisette Man, [name], Designated Weeks in Treatment: 31 History of Present Illness HPI Description: 08/22/17-He is here for initial evaluation of a right heel wound, right toe wound and left heel fissures. He states that he has chronic dry, flaking skin and frequently has cracked heels. He states the right heel wound developed as such but he developed the wound approximately 3 weeks ago when pulling some skin off; he was unaware of the right toe ulcer. He was treating the right heel with over-the-counter triple antibiotic ointment and peroxide. He presented to urgent care on 3/12 with a 2 x 2 centimeter ulceration to the right heel and was discharged with a 10 day prescription for Bactrim. He does have a history of neuropathy, diet controlled diabetic. His does not know his most recent A1c but will be seeing his PCP next week, there is no evidence of an A1c in Epic. He denies smoking, does have a history of alcohol use, he admits to 5+/- beers weekly not daily. He did follow-up with cardiology on 3/18 with c/o ble edema, they ordered DVT study which he completed on 3/21. He reports that the bilateral DVT study was negative per the ultrasound technician, no report is available at this time. READMISSION 11/20/2018 this is a now 69 year old man who is here for review of wounds on his lower extremity on the left. We note that he was here on a single visit in March he tells [place] that over the last 2019. His major issue is that he dropped a box and hit the outside of his left lateral malleolus about 3 weeks ago. This is left him with a nonhealing area. He has been using Iodosorb ointment he had apparently from the last stay in this clinic. Also of note over the last 6 weeks he has noted an area that is raised and will scab over but  then he traumatizes a scab and then this bleeds and reopens. This is  on the left posterior calf. All of this appears to be on the background of chronic venous insufficiency. He has had DVT studies in the past that were negative in 2019. He is also had arterial studies that showed an ABI on the right of 0.76 and on the left of 1.05. Past medical history; the patient states that he is not a diabetic although I note there is some suggested he was in the note from last year. He has a history of coronary artery disease. He tells me that he has had a history of skin cancer but he is not sure which one but that includes one on the left upper thigh. ABIs done previously 0.76 on the right and 1.05 on the left 6/26; patient readmitted to the clinic last week. He had a traumatic area over the left lateral malleolus as well as a hyper granulated growth on the left posterior calf. He also has severe chronic venous insufficiency. 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 We have been using Iodoflex to the traumatic wound under compression. 7/9; area over the left lateral malleolus continues to require debridement and we are continuing with Iodoflex under compression. He has severe chronic venous insufficiency. 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 This patient has severe bilateral venous insufficiency and venous hypertension with skin changes resulting. He requires bilateral reflux studies Finally he traumatized his right lateral calf while doing yard work about a week ago. The area is small with a flap of skin over the majority of the wound but I am not sure this is going to remain viable 7/16; still not a viable surface on the left lateral malleolus in fact this wound is deeper. We have been using Iodoflex. 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 He has severe bilateral venous insufficiency and I have ordered reflux studies these apparently are booked for next week. Our intake nurse noted some yellowish-green drainage coming from the wound on the left ankle. Patient was insistent on talking about doing carotid ultrasounds and he was hoping to get these done with his reflux studies next week. I spent a few minutes talking to him about this I just could not put together in a logical way what he is concerned about. At the end of this I asked him to see his primary doctor about this he says he does not have a primary doctor advised him to get a primary doctor to go over this with him. It was not really clear where the level of concern was specific to carotid ultrasound 7/23; a better looking surface on the left lateral malleolus although it still requires debridement. He tells me he has an appointment to Mohs surgery center next week. Small area on the right lateral calf. The patient went to have his reflux studies at [facility] I do not think they took his compression wraps off. He did have reflux on the left in the common femoral greater saphenous and saphenofemoral junction and the great saphenous vein in the proximal thigh. 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 Looking at the numbers it would appear that the maximal reflux was in the great saphenous vein in the proximal thigh. As usual I am not sure what would be possible to do here. I will send him to vascular surgery 7/30; the patient still requires debridement in the left lateral malleolus although in general the wound is still deep and and punched out. We are using Sorbact. The area on the right lateral calf  is improved. 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 The patient does not have a vascular surgery appointment nor does he have a skin surgery center appointment. I am really not sure what the issues are here although I know vascular surgery as well behind. 8/6-Patient does not have a vascular appointment nor does he have a skin surgery appointment firmed up at this time although he says he is called them both, impressed upon him that he should see whoever is available for the vascular so the vein studies can be interpreted. 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 He is here for the left lateral malleoli wound for which we are using SOrbact, and he is disappointed the wound is not healing 8/21; patient saw Dr. Doren Custard on 01/21/2019. Dr. Doren Custard was concerned about his arterial flow. Noting that he had trouble feeling the dorsalis pedis and posterior tibial arteries also noteworthy that the Doppler had a barely biphasic posterior tibial signal with a monophasic but fairly brisk dorsalis pedis signal. He is booked for an angiogram next Friday. We have been using 3 layer compression on him. 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 Given Dr. Mee Hives concern it was difficult for me to be comfortable with him going ahead with the cancer extraction and I have suggested canceling this. Finally he has an increase in the erythematous contact dermatitis in the entire wrapped area of the left leg  which is probably an allergy to the contact layer of the 3 layer. 8/27; he is going for his angiogram tomorrow by Dr. Doren Custard. We cancel the Mohs surgery on the tumor on his posterior calf pending the angiogram tomorrow. If this shows adequate blood flow to this area I think we can rebook this. His main wound is on the left lateral malleolus a small refractory punched-out area. We have been using Hydrofera Blue 9/3; his angiogram was done as scheduled. He does indeed have PAD. The common femoral, deep femoral superficial femoral and popliteal arteries will are all widely patent. 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 The overall thought was that he had enough adequate circulation to heal his venous ulcer and continue to use mild elevation and compression. Noted that he had biphasic posterior tibial signals with a Doppler ABI of 0.91. It  was not felt that the stenosis in the proximal tibial artery would be associated with increased blood flow he will follow-up with Dr. Doren Custard in 1 month He now needs to get the skin surgery appointment set up. The area is growing larger and bleeding frequently on the posterior left calf per the patient 02/11/19-Patient is back after 1 week, results of his vascular studies are noted, patient needs to have a skin surgery appointment established apparently he did not return the calls this week. The area on the posterior calf is where the skin surgery needs to be done, the left lateral malleoli are wound appears to be about the same we will using Hydrofera Blue change 9/24; patient finally has a booking to deal with the cancer on his posterior calf I believe with the skin surgery center. Not much change in the wound over the left lateral malleolus. It is been 2 weeks since he was  here. I changed him to endoform last time 10/2; the patient has been to see dermatology. Scheduled for surgery on 10/14. Using endoform to the wound over the lateral malleolus some improvement in the wound condition but not the surface area. He has severe chronic venous insufficiency. He also has some arterial disease he was angiogram by Dr. Doren Custard. He did not think that anything needed to be done from an intervention point of view. 10/8; his surgery is still scheduled for 10/14. It is concerning about whether he is healed or maintained skin integrity and is sutured area given the severity of his chronic venous insufficiency. The area we are following is on the left lateral malleolus. Some improvement in the surface area. No debridement today we have been using endoform 10/15; the patient finally had his surgery for the tumor on his left posterior calf. We did not look at this today. Still a punched out area in the lower left malleolus. Perhaps some mild improvement we have been using endoform We were denied for Grafix and still have not heard back on Oasis 10/22; no real change in this wound. I am able to debride it to a healthy looking surface but the next week there is recurrent debris and no improvement in depth. Punched out over the left lateral malleolus. We have not heard back on Oasis. We were denied for Grafix 11/6; wound is perhaps slightly smaller. Still punched out. He was approved for Oasis we applied Oasis #1. His surgical wound on the back of the left calf was reviewed by dermatology we are not reviewing this 11/13; I think the wound is filled in somewhat. Certainly a much more vibrant looking surface. Oasis #2. I looked at his surgical site on the back of the calf today for the first time. Clean incision small open areas 11/19; measurements not much different today. Still 0.4 cm in depth although post debridement the wound surface looks better. Oasis #3 the site on the back of his  calf looks satisfactory. I put him in 4 layer compression last week he seems to tolerate this. He is complaining of some tenderness around the wound. This was controlled swelling around the wound. He seems to have tolerated this 04/28/2019 on evaluation today patient appears to be doing slightly better based on measurements compared to last week. He is here for Oasis application #4 to the ankle region. Fortunately there is no signs of other wounds open at this point and no signs of infection. 12/3; Oasis #5. We have a better looking wound surface but not much  change in depth at 0.6 cm. 12/11; Oasis #6 again some improvement this week better looking surface. Perhaps some improvement in depth 12/17 Oasis number 7.3 cm in depth 05/26/2019 patient is here for Oasis application #8 in regard to his wound. He is showing evidence of some improvement right now with the Oasis. He did also go for his x-ray today although the results are not yet available in epic for review. 12/30- Patient returns at 1 week for Oasis application #9 with regard to his left lower extremity foot wound, which is showing some evidence of improvement, the x-ray that was done last week did show slight periosteal elevation and therefore he would be in line to get an MRI to confirm these findings 06/10/2019; the x-ray that the patient had done showed cortical irregularity and suggested an MRI. An MRI was ordered when he was here I believe on 12/23 but we still do not have an appointment for this. I did 9 Oasis on this man and really the improvement is quite dramatic however it is still not closed but certainly a lot better looking than say 5 to 6 weeks ago. The patient is fixated on Apligraf. I told him that I will not consider ordering this until the MRI is done. Today we put endoform on the wound 1/15; the MRI that I ordered was finally done. Unfortunately it actually posed a difficult question rather than answering it. It was felt  that there was subtle periosteal reaction and minimal focal edema in the lateral malleolus. The major differential would be reactive findings rather than necessarily representing early active osteomyelitis. Given the original depth of this wound however I do not know how I can exclude the osteomyelitis and the consequences of not treating this may be disastrous. 1/21; the patient arrives in clinic today unfortunately had scissor injuries to his upper left medial and mid calf. The worst one here is the mid calf. His wound actually continues to contract. He is taking Levaquin which I am trying to get into him for a month. He is says he is having 1-2 loose stools a day but this does not appear to be watery diarrhea. He is also strongly requesting another round of Oasis. This is after I managed to talk him out of Apligraf. Frankly the wound is too small for an Apligraf now 1/28; I am continuing Levaquin for another 2 weeks. He appears to be tolerating this well without side effects. Small punched out area on the left lateral malleolus. Debris still present we have been using endoform Electronic Signature(s) Signed: 07/01/2019 5:54:40 PM By: Linton Ham MD Entered By: Linton Ham on 07/01/2019 13:05:52 -------------------------------------------------------------------------------- Physical Exam Details Patient Name: Date of Service: BINH, MENCIAS 07/01/2019 11:00 AM Medical Record [id number] Patient Account Number: [id number] Date of Birth/Sex: Treating RN: 08-Oct-1950 (69 y.o. M) Primary Care Provider: PATIENT, NO Other Clinician: Referring Provider: Treating Provider/Extender:Fraidy Mccarrick, [name], Designated Weeks in Treatment: 8 Constitutional Patient is hypertensive.. Pulse regular and within target range for patient.Marland Kitchen Respirations regular, non-labored and within target range.. Temperature is normal and within the target range for the patient.Marland Kitchen Appears in no  distress. Notes Wound exam; left lateral malleolus. Still 2 to 3 mm of depth but the remaining wound appears healthy and once again a lot of this is epithelialized. No evidence of surrounding infection. We are well away from bone. The to scissor lacerations are superiorly and inferiorly on the right upper and more right distal calf. The area we Steri-Stripped last  week did not hold together. Electronic Signature(s) Signed: 07/01/2019 5:54:40 PM By: Linton Ham MD Entered By: Linton Ham on 07/01/2019 13:06:47 -------------------------------------------------------------------------------- Physician Orders Details Patient Name: Date of Service: KALVIN, SPECKER 07/01/2019 11:00 AM Medical Record [id number] Patient Account Number: [id number] Date of Birth/Sex: Treating RN: [birth year] (69 y.o. 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 Dressing Change Frequency Wound #4 Left,Lateral Malleolus Do not change entire dressing for one week. Skin Barriers/Peri-Wound Care TCA Cream or Ointment - liberal to lower leg Wound Cleansing Wound #4 Left,Lateral Malleolus May shower with protection. Primary Wound Dressing Wound #4 Left,Lateral Malleolus Endoform - moisten with hydrogel cover with adaptic and steri-strips. Wound #7 Left,Distal,Medial Lower Leg Endoform - moisten hydrogel Wound #8  Left,Proximal,Medial Lower Leg Endoform - moisten hydrogel Secondary Dressing Wound #4 Left,Lateral Malleolus Foam - foam donut. Dry Gauze Other: - pad the left lateral and anterior portion of lower leg. Wound #7 Left,Distal,Medial Lower Leg Dry Gauze Wound #8 Left,Proximal,Medial Lower Leg Dry Gauze Edema Control 4 layer compression: Left lower extremity - 4 press - no cotton or kerlix layer Avoid standing for long periods of time Elevate legs to the level of the heart or above for 30 minutes daily and/or when sitting, a frequency of: - throughout the day Exercise regularly Electronic Signature(s) Signed: 07/01/2019 5:54:40 PM By: Linton Ham MD Signed: 07/01/2019 6:05:55 PM By: [name] Entered By: [name] on 07/01/2019 11:59:06 -------------------------------------------------------------------------------- Problem List Details Patient Name: Date of Service: [name] 07/01/2019 11:00 AM Medical Record [id number] Patient Account Number: [id number] Date of Birth/Sex: Treating RN: Aug 29, 1950 (69 y.o. [name] Primary Care Provider: PATIENT, NO Other Clinician: Referring Provider: Treating Provider/Extender:Kileen Lange, [name], Designated Weeks in Treatment: 31 Active Problems ICD-10 Evaluated Encounter Code Description Active Date Today Diagnosis I87.332 Chronic venous hypertension (idiopathic) with ulcer 11/20/2018 No Yes and inflammation of left lower extremity L97.321 Non-pressure chronic ulcer of left ankle limited to 11/20/2018 No Yes breakdown of skin M86.472 Chronic osteomyelitis with draining sinus, left ankle 06/18/2019 No Yes and foot L97.228 Non-pressure chronic ulcer of left calf with other 06/24/2019 No Yes specified severity Inactive Problems ICD-10 Code Description Active Date Inactive Date C44.799 Other specified malignant neoplasm of skin of left lower limb, 11/27/2018 11/27/2018 including hip L97.211 Non-pressure  chronic ulcer of right calf limited to breakdown of 12/10/2018 12/10/2018 skin Resolved Problems Electronic Signature(s) Signed: 07/01/2019 5:54:40 PM By: Linton Ham MD Entered By: Linton Ham on 07/01/2019 13:04:27 -------------------------------------------------------------------------------- Progress Note Details Patient Name: Date of Service: [name] 07/01/2019 11:00 AM Medical Record [id number] Patient Account Number: [id number] Date of Birth/Sex: Treating RN: [birth year] (69 y.o. M) Primary Care Provider: PATIENT, NO Other Clinician: Referring Provider: Treating Provider/Extender:Trennon Torbeck, [name], Designated Weeks in Treatment: 31 Subjective History of Present Illness (HPI) 08/22/17-He is here for initial evaluation of a right heel wound, right toe wound and left heel fissures. He states that he has chronic dry, flaking skin and frequently has cracked heels. He states the right heel wound developed as such but he developed the  wound approximately 3 weeks ago when pulling some skin off; he was unaware of the right toe ulcer. He was treating the right heel with over-the-counter triple antibiotic ointment and peroxide. He presented to urgent care on 3/12 with a 2 x 2 centimeter ulceration to the right heel and was discharged with a 10 day prescription for Bactrim. He does have a history of neuropathy, diet controlled diabetic. His does not know his most recent A1c but will be seeing his PCP next week, there is no evidence of an A1c in Epic. He denies smoking, does have a history of alcohol use, he admits to 5+/- beers weekly not daily. He did follow-up with cardiology on 3/18 with c/o ble edema, they ordered DVT study which he completed on 3/21. He reports that the bilateral DVT study was negative per the ultrasound technician, no report is available at this time. READMISSION 11/20/2018 this is a now 69 year old man who is here for review of wounds on his  lower extremity on the left. We note that he was here on a single visit in March he tells [place] that over the last 2019. His major issue is that he dropped a box and hit the outside of his left lateral malleolus about 3 weeks ago. This is left him with a nonhealing area. He has been using Iodosorb ointment he had apparently from the last stay in this clinic. Also of note over the last 6 weeks he has noted an area that is raised and will scab over but then he traumatizes a scab and then this bleeds and reopens. This is on the left posterior calf. All of this appears to be on the background of chronic venous insufficiency. He has had DVT studies in the past that were negative in 2019. He is also had arterial studies that showed an ABI on the right of 0.76 and on the left of 1.05. Past medical history; the patient states that he is not a diabetic although I note there is some suggested he was in the note from last year. He has a history of coronary artery disease. He tells me that he has had a history of skin cancer but he is not sure which one but that includes one on the left upper thigh. ABIs done previously 0.76 on the right and 1.05 on the left 6/26; patient readmitted to the clinic last week. He had a traumatic area over the left lateral malleolus as well as a hyper granulated growth on the left posterior calf. He also has severe chronic venous insufficiency. 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 We have been using Iodoflex to the traumatic wound under compression. 7/9; area over the left lateral malleolus continues to require debridement and we are continuing with Iodoflex under compression. He has severe chronic venous insufficiency. 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 This patient has severe bilateral venous  insufficiency and venous hypertension with skin changes resulting. He requires bilateral reflux studies Finally he traumatized his right lateral calf while doing yard work about a week ago. The area is small with a flap of skin over the majority of the wound but I am not sure this is going to remain viable 7/16; still not a viable surface on the left lateral malleolus in fact this wound  is deeper. We have been using Iodoflex. 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 He has severe bilateral venous insufficiency and I have ordered reflux studies these apparently are booked for next week. Our intake nurse noted some yellowish-green drainage coming from the wound on the left ankle. Patient was insistent on talking about doing carotid ultrasounds and he was hoping to get these done with his reflux studies next week. I spent a few minutes talking to him about this I just could not put together in a logical way what he is concerned about. At the end of this I asked him to see his primary doctor about this he says he does not have a primary doctor advised him to get a primary doctor to go over this with him. It was not really clear where the level of concern was specific to carotid ultrasound 7/23; a better looking surface on the left lateral malleolus although it still requires debridement. He tells me he has an appointment to Mohs surgery center next week. Small area on the right lateral calf. The patient went to have his [facility] I do not think they took his compression wraps off. He did have reflux on the left in the common femoral greater saphenous and saphenofemoral junction and the great saphenous vein in the proximal thigh. 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 Looking at the numbers it would appear that the maximal reflux was in the great saphenous vein in the proximal thigh. As usual I am not sure what would be possible to do here. I will send him to vascular surgery 7/30; the patient still requires debridement in the left lateral malleolus although in general the wound is still deep and and punched out. We are using Sorbact. The area on the right lateral calf is improved. 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 The patient does not have a vascular surgery appointment nor does he have a skin surgery center appointment. I am really not sure what the issues are here although I know vascular surgery as well behind. 8/6-Patient does not have a vascular appointment nor does he have a skin surgery appointment firmed up at this time although he says he is called them both, impressed upon him that he should see whoever is available for the vascular so the vein studies can be interpreted. 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 He is here for the left lateral malleoli wound for which we are using SOrbact, and he is disappointed the wound is not healing 8/21; patient saw Dr. Doren Custard on 01/21/2019. Dr. Doren Custard was concerned about his arterial flow. Noting that he had trouble feeling the dorsalis pedis and posterior tibial arteries also noteworthy that the Doppler had a barely biphasic  posterior tibial signal with a monophasic but fairly brisk dorsalis pedis signal. He is  booked for an angiogram next Friday. We have been using 3 layer compression on him. 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 Given Dr. Mee Hives concern it was difficult for me to be comfortable with him going ahead with the cancer extraction and I have suggested canceling this. Finally he has an increase in the erythematous contact dermatitis in the entire wrapped area of the left leg which is probably an allergy to the contact layer of the 3 layer. 8/27; he is going for his angiogram tomorrow by Dr. Doren Custard. We cancel the Mohs surgery on the tumor on his posterior calf pending the angiogram tomorrow. If this shows adequate blood flow to this area I think we can rebook this. His main wound is on the left lateral malleolus a small refractory punched-out area. We have been using Hydrofera Blue 9/3; his angiogram was done as scheduled. He does indeed have PAD. The common femoral, deep femoral superficial femoral and popliteal arteries will are all widely patent. 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 The overall thought was that he had enough adequate circulation to heal his venous ulcer and continue to use mild elevation and compression. Noted that he had biphasic posterior tibial signals with a Doppler ABI of 0.91. It was not felt that the stenosis in the proximal tibial artery would be associated with increased blood flow he will follow-up with Dr. Doren Custard in 1 month He now needs to get the skin surgery appointment set up. The area is growing larger and bleeding frequently on the posterior left calf per the patient 02/11/19-Patient is back after 1 week, results of his vascular studies are noted, patient needs to have a skin surgery appointment  established apparently he did not return the calls this week. The area on the posterior calf is where the skin surgery needs to be done, the left lateral malleoli are wound appears to be about the same we will using Hydrofera Blue change 9/24; patient finally has a booking to deal with the cancer on his posterior calf I believe with the skin surgery center. Not much change in the wound over the left lateral malleolus. It is been 2 weeks since he was here. I changed him to endoform last time 10/2; the patient has been to see dermatology. Scheduled for surgery on 10/14. Using endoform to the wound over the lateral malleolus some improvement in the wound condition but not the surface area. He has severe chronic venous insufficiency. He also has some arterial disease he was angiogram by Dr. Doren Custard. He did not think that anything needed to be done from an intervention point of view. 10/8; his surgery is still scheduled for 10/14. It is concerning about whether he is healed or maintained skin integrity and is sutured area given the severity of his chronic venous insufficiency. The area we are following is on the left lateral malleolus. Some improvement in the surface area. No debridement today we have been using endoform 10/15; the patient finally had his surgery for the tumor on his left posterior calf. We did not look at this today. Still a punched out area in the lower left malleolus. Perhaps  some mild improvement we have been using endoform We were denied for Grafix and still have not heard back on Oasis 10/22; no real change in this wound. I am able to debride it to a healthy looking surface but the next week there is recurrent debris and no improvement in depth. Punched out over the left lateral malleolus. We have not heard back on Oasis. We were denied for Grafix 11/6; wound is perhaps slightly smaller. Still punched out. He was approved for Oasis we applied Oasis #1. His surgical wound on the  back of the left calf was reviewed by dermatology we are not reviewing this 11/13; I think the wound is filled in somewhat. Certainly a much more vibrant looking surface. Oasis #2. I looked at his surgical site on the back of the calf today for the first time. Clean incision small open areas 11/19; measurements not much different today. Still 0.4 cm in depth although post debridement the wound surface looks better. Oasis #3 the site on the back of his calf looks satisfactory. I put him in 4 layer compression last week he seems to tolerate this. He is complaining of some tenderness around the wound. This was controlled swelling around the wound. He seems to have tolerated this 04/28/2019 on evaluation today patient appears to be doing slightly better based on measurements compared to last week. He is here for Oasis application #4 to the ankle region. Fortunately there is no signs of other wounds open at this point and no signs of infection. 12/3; Oasis #5. We have a better looking wound surface but not much change in depth at 0.6 cm. 12/11; Oasis #6 again some improvement this week better looking surface. Perhaps some improvement in depth 12/17 Oasis number 7.3 cm in depth 05/26/2019 patient is here for Oasis application #8 in regard to his wound. He is showing evidence of some improvement right now with the Oasis. He did also go for his x-ray today although the results are not yet available in epic for review. 12/30- Patient returns at 1 week for Oasis application #9 with regard to his left lower extremity foot wound, which is showing some evidence of improvement, the x-ray that was done last week did show slight periosteal elevation and therefore he would be in line to get an MRI to confirm these findings 06/10/2019; the x-ray that the patient had done showed cortical irregularity and suggested an MRI. An MRI was ordered when he was here I believe on 12/23 but we still do not have an appointment  for this. I did 9 Oasis on this man and really the improvement is quite dramatic however it is still not closed but certainly a lot better looking than say 5 to 6 weeks ago. The patient is fixated on Apligraf. I told him that I will not consider ordering this until the MRI is done. Today we put endoform on the wound 1/15; the MRI that I ordered was finally done. Unfortunately it actually posed a difficult question rather than answering it. It was felt that there was subtle periosteal reaction and minimal focal edema in the lateral malleolus. The major differential would be reactive findings rather than necessarily representing early active osteomyelitis. Given the original depth of this wound however I do not know how I can exclude the osteomyelitis and the consequences of not treating this may be disastrous. 1/21; the patient arrives in clinic today unfortunately had scissor injuries to his upper left medial and mid calf. The worst  one here is the mid calf. His wound actually continues to contract. He is taking Levaquin which I am trying to get into him for a month. He is says he is having 1-2 loose stools a day but this does not appear to be watery diarrhea. He is also strongly requesting another round of Oasis. This is after I managed to talk him out of Apligraf. Frankly the wound is too small for an Apligraf now 1/28; I am continuing Levaquin for another 2 weeks. He appears to be tolerating this well without side effects. Small punched out area on the left lateral malleolus. Debris still present we have been using endoform Objective Constitutional Patient is hypertensive.. Pulse regular and within target range for patient.Marland Kitchen Respirations regular, non-labored and within target range.. Temperature is normal and within the target range for the patient.Marland Kitchen Appears in no distress. Vitals Time Taken: 11:15 AM, Height: 73 in, Weight: 205 lbs, BMI: 27, Temperature: 98.8 F, Pulse: 91 bpm, Respiratory  Rate: 18 breaths/min, Blood Pressure: 172/87 mmHg. General Notes: Wound exam; left lateral malleolus. Still 2 to 3 mm of depth but the remaining wound appears healthy and once again a lot of this is epithelialized. No evidence of surrounding infection. We are well away from bone. ooThe to scissor lacerations are superiorly and inferiorly on the right upper and more right distal calf. The area we Steri-Stripped last week did not hold together. Integumentary (Hair, Skin) Wound #4 status is Open. Original cause of wound was Trauma. The wound is located on the Left,Lateral Malleolus. The wound measures 0.9cm length x 0.9cm width x 0.4cm depth; 0.636cm^2 area and 0.254cm^3 volume. There is Fat Layer (Subcutaneous Tissue) Exposed exposed. There is no tunneling or undermining noted. There is a medium amount of serous drainage noted. The wound margin is well defined and not attached to the wound base. There is small (1-33%) pink granulation within the wound bed. There is a large (67-100%) amount of necrotic tissue within the wound bed including Adherent Slough. Wound #7 status is Open. Original cause of wound was Trauma. The wound is located on the Left,Distal,Medial Lower Leg. The wound measures 1.1cm length x 0.2cm width x 0.1cm depth; 0.173cm^2 area and 0.017cm^3 volume. There is Fat Layer (Subcutaneous Tissue) Exposed exposed. There is no tunneling or undermining noted. There is a small amount of serosanguineous drainage noted. The wound margin is distinct with the outline attached to the wound base. There is large (67-100%) red granulation within the wound bed. There is no necrotic tissue within the wound bed. Wound #8 status is Open. Original cause of wound was Trauma. The wound is located on the Left,Proximal,Medial Lower Leg. The wound measures 1.3cm length x 0.7cm width x 0.1cm depth; 0.715cm^2 area and 0.071cm^3 volume. There is Fat Layer (Subcutaneous Tissue) Exposed exposed. There is no  tunneling or undermining noted. There is a small amount of serosanguineous drainage noted. The wound margin is distinct with the outline attached to the wound base. There is large (67-100%) red granulation within the wound bed. There is no necrotic tissue within the wound bed. 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 There was a Excisional Skin/Subcutaneous Tissue Debridement with a total area of 0.81 sq cm performed by Ricard Dillon., MD. With the following instrument(s): Curette to remove Viable and Non-Viable tissue/material. Material removed includes Subcutaneous Tissue, Slough, Skin: Dermis, and Fibrin/Exudate after achieving pain control using Lidocaine 4% Topical Solution. A time out was conducted at 11:50, prior to the start of the procedure. A Minimum amount of bleeding was controlled with Pressure. The procedure was tolerated well with a pain level of 0 throughout and a pain level of 0 following the procedure. Post Debridement Measurements: 0.9cm length x 0.9cm width x 0.4cm depth; 0.254cm^3 volume. Character of Wound/Ulcer Post Debridement is improved. Severity of Tissue Post Debridement is: Fat layer exposed. Post procedure Diagnosis Wound #4: Same as Pre-Procedure Pre-procedure diagnosis of Wound #4 is a Venous Leg Ulcer located on the Left,Lateral Malleolus . There was a Four Layer Compression Therapy Procedure with a pre-treatment ABI of 1 by Baruch Gouty, RN. Post procedure Diagnosis Wound #4: Same as  Pre-Procedure Wound #7 Pre-procedure diagnosis of Wound #7 is a Skin Tear located on the Left,Distal,Medial Lower Leg . There was a Four Layer Compression Therapy Procedure with a pre-treatment ABI of 1 by Baruch Gouty, RN. Post procedure Diagnosis Wound #7: Same as Pre-Procedure Wound #8 Pre-procedure diagnosis of Wound #8 is a Skin Tear located on the Left,Proximal,Medial Lower Leg . There was a Four Layer Compression Therapy Procedure with a pre-treatment ABI of 1 by Baruch Gouty, RN. Post procedure Diagnosis Wound #8: Same as Pre-Procedure Plan Follow-up Appointments: Return Appointment in 1 week. Dressing Change Frequency: Wound #4 Left,Lateral Malleolus: Do not change entire dressing for one week. Skin Barriers/Peri-Wound Care: TCA Cream or Ointment - liberal to lower leg Wound Cleansing: Wound #4 Left,Lateral Malleolus: May shower with protection. Primary Wound Dressing: Wound #4 Left,Lateral Malleolus: Endoform - moisten with hydrogel cover with adaptic and steri-strips. Wound #7 Left,Distal,Medial Lower Leg: Endoform - moisten hydrogel Wound #8 Left,Proximal,Medial Lower Leg: Endoform - moisten hydrogel Secondary Dressing: Wound #4 Left,Lateral Malleolus: Foam - foam donut. Dry Gauze Other: - pad the left lateral and anterior portion of lower leg. Wound #7 Left,Distal,Medial Lower Leg: Dry Gauze Wound #8 Left,Proximal,Medial Lower Leg: Dry Gauze Edema Control: 4 layer compression: Left lower extremity - 4 press - no cotton or kerlix layer Avoid standing for long periods of time Elevate legs to the level of the heart or above for 30 minutes daily and/or when sitting, a frequency of: - throughout the day Exercise regularly 1. I am continuing with endoform to all wound areas including the scissor injuries 2. The patient is again insistent now on Oasis almost to a of the session. We explained that it probably is not wise to put an expensive product on an area we  are still treating for infection. Electronic Signature(s) Signed: 07/01/2019 5:54:40 PM By: Linton Ham MD Entered By: Linton Ham on 07/01/2019 13:08:00 -------------------------------------------------------------------------------- SuperBill Details Patient Name: Date of Service: SHANNE, HAMS 07/01/2019 Medical Record [id number] Patient Account Number: [id number] Date of Birth/Sex: Treating RN: May 20, 1951 (69 y.o. [name] Primary Care Provider: PATIENT, NO Other Clinician: Referring Provider: Treating Provider/Extender:Margaretmary Prisk, [name], Designated Weeks in Treatment: 31 Diagnosis Coding ICD-10 Codes Code Description I87.332 Chronic venous hypertension (idiopathic) with ulcer and inflammation of left lower extremity L97.321 Non-pressure chronic ulcer of left ankle limited to breakdown of skin M86.472 Chronic osteomyelitis with draining sinus, left ankle and foot L97.228 Non-pressure chronic ulcer of left calf with other  specified severity Facility Procedures CPT4 Code Description: [id number] - DEB SUBQ TISSUE 20 SQ CM/< ICD-10 Diagnosis Description [id number] Non-pressure chronic ulcer of left ankle limited to breakdo I87.332 Chronic venous hypertension (idiopathic) with ulcer and inf extremity Modifier: wn of skin lammation of le Quantity: 1 ft lower Physician Procedures CPT4 Code Description: [id number] - WC PHYS SUBQ TISS 20 SQ CM ICD-10 Diagnosis Description [id number] Non-pressure chronic ulcer of left ankle limited to breakdo I87.332 Chronic venous hypertension (idiopathic) with ulcer and inf extremity Modifier: wn of skin lammation of lef Quantity: 1 t lower Electronic Signature(s) Signed: 07/01/2019 5:54:40 PM By: Linton Ham MD Entered By: Linton Ham on 07/01/2019 13:08:10

## 2019-07-02 NOTE — Progress Notes (Signed)
[name], [name] ([id number]) Visit Report for 07/01/2019 Arrival Information Details Patient Name: Date of Service: [name], [name] 07/01/2019 11:00 AM Medical Record [id number] Patient Account Number: [id number] Date of Birth/Sex: Treating RN: 08-14-1950 (69 y.o. [name] Primary Care Tobby Fawcett: PATIENT, NO Other Clinician: Referring Torry Istre: Treating Saphyra Hutt/Extender:Robson, [name], Designated Weeks in Treatment: 59 Visit Information History Since Last Visit Added or deleted any medications: No Patient Arrived: [name] Any new allergies or adverse reactions: No Arrival Time: 11:16 Had a fall or experienced change in No Accompanied By: self activities of daily living that may affect Transfer Assistance: None risk of falls: Patient Identification Verified: Yes Signs or symptoms of abuse/neglect since last No Secondary Verification Process Yes visito Completed: Hospitalized since last visit: No Patient Requires Transmission- No Implantable device outside of the clinic excluding No Based Precautions: cellular tissue based products placed in the center Patient Has Alerts: Yes ABIs 08/2018 L1.05 since last visit: Patient Alerts: Has Dressing in Place as Prescribed: Yes R0.76 Has Compression in Place as Prescribed: Yes Pain Present Now: No Electronic Signature(s) Signed: 07/01/2019 5:20:11 PM By: [name] Entered By: [name] on 07/01/2019 11:17:05 -------------------------------------------------------------------------------- Compression Therapy Details Patient Name: Date of Service: [name], [name] 07/01/2019 11:00 AM Medical Record [id number] Patient Account Number: [id number] Date of Birth/Sex: Treating RN: [birth year] (69 y.o. [name] Primary Care Taylan Mayhan: PATIENT, NO Other Clinician: Referring Sharnese Heath: Treating Keevin Panebianco/Extender:Robson, [name], Designated Weeks in Treatment: 31 Compression  Therapy Performed for Wound Wound #4 Left,Lateral Malleolus Assessment: Performed By: Clinician Baruch Gouty, RN Compression Type: Four Layer Pre Treatment ABI: 1 Post Procedure Diagnosis Same as Pre-procedure Electronic Signature(s) Signed: 07/01/2019 6:05:55 PM By: [name] Entered By: [name] on 07/01/2019 11:57:50 -------------------------------------------------------------------------------- Compression Therapy Details Patient Name: Date of Service: [name], [name] 07/01/2019 11:00 AM Medical Record [id number] Patient Account Number: [id number] Date of Birth/Sex: Treating RN: [birth year] (69 y.o. [name] Primary Care Nael Petrosyan: PATIENT, NO Other Clinician: Referring Pasco Marchitto: Treating Jonpaul Lumm/Extender:Robson, [name], Designated Weeks in Treatment: 31 Compression Therapy Performed for Wound Wound #8 Left,Proximal,Medial Lower Leg Assessment: Performed By: Clinician Baruch Gouty, RN Compression Type: Four Layer Pre Treatment ABI: 1 Post Procedure Diagnosis Same as Pre-procedure Electronic Signature(s) Signed: 07/01/2019 6:05:55 PM By: [name] Entered By: [name] on 07/01/2019 11:57:51 -------------------------------------------------------------------------------- Compression Therapy Details Patient Name: Date of Service: [name], [name] 07/01/2019 11:00 AM Medical Record [id number] Patient Account Number: [id number] Date of Birth/Sex: Treating RN: [birth year] (69 y.o. [name] Primary Care Terryon Pineiro: PATIENT, NO Other Clinician: Referring Charee Tumblin: Treating Kynslei Art/Extender:Robson, [name], Designated Weeks in Treatment: 31 Compression Therapy Performed for Wound Wound #7 Left,Distal,Medial Lower Leg Assessment: Performed By: Clinician Baruch Gouty, RN Compression Type: Four Layer Pre Treatment ABI: 1 Post Procedure Diagnosis Same as Pre-procedure Electronic Signature(s) Signed:  07/01/2019 6:05:55 PM By: [name] Entered By: [name] on 07/01/2019 11:57:51 -------------------------------------------------------------------------------- Encounter Discharge Information Details Patient Name: Date of Service: [name], [name] 07/01/2019 11:00 AM Medical Record [id number] Patient Account Number: [id number] Date of Birth/Sex: Treating RN: 08-27-1950 (70 y.o. [name] Primary Care Shandy Vi: PATIENT, NO Other Clinician: Referring Wilene Pharo: Treating Amro Winebarger/Extender:Robson, [name], Designated Weeks in Treatment: 31 Encounter Discharge Information Items Post Procedure Vitals Discharge Condition: Stable Temperature (F): 98.8 Ambulatory Status: Cane Pulse (bpm): 91 Discharge Destination: Home Respiratory Rate (breaths/min): 18 Transportation: Private Auto Blood Pressure (mmHg): 172/87 Accompanied By: self Schedule Follow-up Appointment: Yes Clinical Summary of Care: Patient Declined Electronic Signature(s) Signed: 07/02/2019 8:33:08 AM By: Johna Roles,  Jacques Navy, BSN Entered By: Baruch Gouty on 07/01/2019 12:46:10 -------------------------------------------------------------------------------- Lower Extremity Assessment Details Patient Name: Date of Service: [name], [name] 07/01/2019 11:00 AM Medical Record [id number] Patient Account Number: [id number] Date of Birth/Sex: Treating RN: 03/11/1951 (69 y.o. [name] Primary Care Wilmar Prabhakar: PATIENT, NO Other Clinician: Referring Abree Romick: Treating Renne Cornick/Extender:Robson, [name], Designated Weeks in Treatment: 31 Edema Assessment Assessed: [Left: No] [Right: No] Edema: [Left: N] [Right: o] Calf Left: Right: Point of Measurement: 35 cm From Medial Instep 30.5 cm cm Ankle Left: Right: Point of Measurement: 9 cm From Medial Instep 23.1 cm cm Vascular Assessment Pulses: Dorsalis Pedis Palpable: [Left:Yes] Electronic Signature(s) Signed: 07/01/2019  5:20:11 PM By: [name] Entered By: [name] on 07/01/2019 11:29:02 -------------------------------------------------------------------------------- Multi Wound Chart Details Patient Name: Date of Service: Michael Post F. 07/01/2019 11:00 AM Medical Record [id number] Patient Account Number: [id number] Date of Birth/Sex: Treating RN: [birth year] (69 y.o. M) Primary Care Chrissa Meetze: PATIENT, NO Other Clinician: Referring Luretta Everly: Treating Gari Trovato/Extender:Robson, [name], Designated Weeks in Treatment: 31 Vital Signs Height(in): 73 Pulse(bpm): 91 Weight(lbs): 205 Blood Pressure(mmHg): 172/87 Body Mass Index(BMI): 27 Temperature(F): 98.8 Respiratory 18 Rate(breaths/min): Photos: [4:No Photos] [7:No Photos] [8:No Photos] Wound Location: [4:Left Malleolus - Lateral] [7:Left Lower Leg - Medial, Distal] [8:Left Lower Leg - Medial, Proximal] Wounding Event: [4:Trauma] [7:Trauma] [8:Trauma] Primary Etiology: [4:Venous Leg Ulcer] [7:Skin Tear] [8:Skin Tear] Comorbid History: [4:Arrhythmia, Coronary Artery Arrhythmia, Coronary Artery Arrhythmia, Coronary Artery Disease, Type II Diabetes Disease, Type II Diabetes Disease, Type II Diabetes] Date Acquired: [4:11/02/2018] [7:06/24/2019] [8:06/24/2019] Weeks of Treatment: [4:31] [7:1] [8:1] Wound Status: [4:Open] [7:Open] [8:Open] Measurements L x W x [name] 0.9x0.9x0.4 [7:1.1x0.2x0.1] [8:1.3x0.7x0.1] (cm) Area (cm) : [4:0.636] [7:0.173] [8:0.715] Volume (cm) : [4:0.254] [7:0.017] [8:0.071] % Reduction in Area: [4:50.90%] [7:70.60%] [8:-264.80%] % Reduction in Volume: -95.40% [7:71.20%] [8:-255.00%] Classification: [4:Full Thickness Without Exposed Support StructuresExposed Support StructuresExposed Support Structures] [7:Full Thickness Without] [8:Full Thickness Without] Exudate Amount: [4:Medium] [7:Small] [8:Small] Exudate Type: [4:Serous] [7:Serosanguineous] [8:Serosanguineous] Exudate Color: [4:amber] [7:red,  brown] [8:red, brown] Wound Margin: [4:Well defined, not attached Distinct, outline attached Distinct, outline attached] Granulation Amount: [4:Small (1-33%)] [7:Large (67-100%)] [8:Large (67-100%)] Granulation Quality: [4:Pink] [7:Red] [8:Red] Necrotic Amount: [4:Large (67-100%)] [7:None Present (0%)] [8:None Present (0%)] Exposed Structures: [4:Fat Layer (Subcutaneous Fat Layer (Subcutaneous Fat Layer (Subcutaneous Tissue) Exposed: Yes Fascia: No Tendon: No Muscle: No Joint: No Bone: No] [7:Tissue) Exposed: Yes Fascia: No Tendon: No Muscle: No Joint: No Bone: No] [8:Tissue) Exposed: Yes  Fascia: No Tendon: No Muscle: No Joint: No Bone: No] Epithelialization: [4:Small (1-33%)] [7:None] [8:None] Debridement: [4:Debridement - Excisional N/A] [8:N/A] Pre-procedure [4:11:50] [7:N/A] [8:N/A] Verification/Time Out Taken: Pain Control: [4:Lidocaine 4% Topical Solution] [7:N/A] [8:N/A] Tissue Debrided: [4:Subcutaneous, Slough] [7:N/A] [8:N/A] Level: [4:Skin/Subcutaneous Tissue] [7:N/A] [8:N/A] Debridement Area (sq cm):0.81 [7:N/A] [8:N/A] Instrument: [4:Curette] [7:N/A] [8:N/A] Bleeding: [4:Minimum] [7:N/A] [8:N/A] Hemostasis Achieved: [4:Pressure] [7:N/A] [8:N/A] Procedural Pain: [4:0] [7:N/A] [8:N/A] Post Procedural Pain: [4:0] [7:N/A] [8:N/A] Debridement Treatment Procedure was tolerated [7:N/A] [8:N/A] Response: [4:well] Post Debridement [4:0.9x0.9x0.4] [7:N/A] [8:N/A] Measurements L x W x [name] (cm) Post Debridement [4:0.254] [7:N/A] [8:N/A] Volume: (cm) Procedures Performed: Compression Therapy [4:Debridement] [7:Compression Therapy] [8:Compression Therapy] Treatment Notes Wound #4 (Left, Lateral Malleolus) 2. Periwound Care Moisturizing lotion 3. Primary Dressing Applied Endoform Other primary dressing (specifiy in notes) 4. Secondary Dressing Dry Gauze Foam 6. Support Layer Applied 4 layer compression wrap Notes adaptic touch Wound #7 (Left, Distal, Medial Lower Leg) 2.  Periwound Care Moisturizing lotion 3. Primary Dressing Applied Endoform Other primary dressing (specifiy in notes) 4.  Secondary Dressing Dry Gauze Foam 6. Support Layer Applied 4 layer compression wrap Notes adaptic touch Wound #8 (Left, Proximal, Medial Lower Leg) 2. Periwound Care Moisturizing lotion 3. Primary Dressing Applied Endoform Other primary dressing (specifiy in notes) 4. Secondary Dressing Dry Gauze Foam 6. Support Layer Applied 4 layer compression wrap Notes [employer] Ad[employer] account planner) Signed: 07/01/2019 5:54:40 PM By: Linton Ham MD Entered By: Linton Ham on 07/01/2019 13:04:36 -------------------------------------------------------------------------------- Multi-Disciplinary Care Plan Details Patient Name: Date of Service: [name], [name] 07/01/2019 11:00 AM Medical Record [id number] Patient Account Number: [id number] Date of Birth/Sex: Treating RN: 05/15/1951 (69 y.o. [name] Primary Care Luberta Grabinski: PATIENT, NO Other Clinician: Referring Anas Reister: Treating Nyanna Heideman/Extender:Robson, [name], Designated Weeks in Treatment: 31 Active Inactive Venous Leg Ulcer Nursing Diagnoses: Knowledge deficit related to disease process and management Potential for venous Insuffiency (use before diagnosis confirmed) Goals: Patient will maintain optimal edema control Date Initiated: 11/20/2018 Target Resolution Date: 08/27/2019 Goal Status: Active Patient/caregiver will verbalize understanding of disease process and disease management Date Initiated: 11/20/2018 Date Inactivated: 12/17/2018 Target Resolution Date: 12/18/2018 Goal Status: Met Interventions: Assess peripheral edema status every visit. Compression as ordered Provide education on venous insufficiency Treatment Activities: Therapeutic compression applied : 11/20/2018 Notes: Electronic Signature(s) Signed: 07/01/2019 6:05:55 PM By: [name] Entered By:  [name] on 07/01/2019 11:40:59 -------------------------------------------------------------------------------- Pain Assessment Details Patient Name: Date of Service: [name], [name] 07/01/2019 11:00 AM Medical Record [id number] Patient Account Number: [id number] Date of Birth/Sex: Treating RN: Jul 08, 1950 (69 y.o. [name] Primary Care Elgene Coral: PATIENT, NO Other Clinician: Referring Jewel Mcafee: Treating Taariq Leitz/Extender:Robson, [name], Designated Weeks in Treatment: 31 Active Problems Location of Pain Severity and Description of Pain Patient Has Paino No Site Locations Pain Management and Medication Current Pain Management: Electronic Signature(s) Signed: 07/01/2019 5:20:11 PM By: [name] Entered By: [name] on 07/01/2019 11:25:05 -------------------------------------------------------------------------------- Patient/Caregiver Education Details Patient Name: Date of Service: [name], Michael F. 1/28/2021andnbsp11:00 AM Medical Record [id number] Patient Account Number: [id number] Date of Birth/Gender: Treating RN: [birth year] (68 y.o. [name] Primary Care Physician: PATIENT, NO Other Clinician: Referring Physician: Treating Physician/Extender:Robson, [name], Designated Weeks in Treatment: 31 Education Assessment Education Provided To: Patient Education Topics Provided Venous: Handouts: Managing Venous Disease and Related Ulcers Methods: Explain/Verbal Responses: Reinforcements needed Electronic Signature(s) Signed: 07/01/2019 6:05:55 PM By: [name] Entered By: [name] on 07/01/2019 11:41:11 -------------------------------------------------------------------------------- Wound Assessment Details Patient Name: Date of Service: [name], [name] 07/01/2019 11:00 AM Medical Record [id number] Patient Account Number: [id number] Date of Birth/Sex: Treating RN: Dec 11, 1950 (69 y.o.  M) Primary Care Gaynelle Pastrana: PATIENT, NO Other Clinician: Referring Kendon Sedeno: Treating Sarayah Bacchi/Extender:Robson, [name], Designated Weeks in Treatment: 31 Wound Status Wound Number: 4 Primary Venous Leg Ulcer Etiology: Wound Location: Left Malleolus - Lateral Wound Open Wounding Event: Trauma Status: Date Acquired: 11/02/2018 Comorbid Arrhythmia, Coronary Artery Disease, Weeks Of Treatment: 31 History: Type II Diabetes Clustered Wound: No Photos Wound Measurements Length: (cm) 0.9 % Reduct Width: (cm) 0.9 % Reduct Depth: (cm) 0.4 Epitheli Area: (cm) 0.636 Tunneli Volume: (cm) 0.254 Undermi Wound Description Classification: Full Thickness Without Exposed Support Foul Odo Structures Slough/F Wound Well defined, not attached Margin: Exudate Medium Amount: Exudate Serous Type: Exudate amber Color: Wound Bed Granulation Amount: Small (1-33%) Granulation Quality: Pink Fascia E Necrotic Amount: Large (67-100%) Fat Laye Necrotic Quality: Adherent Slough Tendon E Muscle E Joint Ex Bone Exp r After Cleansing: No ibrino Yes Exposed Structure xposed: No r (Subcutaneous Tissue) Exposed: Yes xposed: No xposed: No posed: No osed:  No ion in Area: 50.9% ion in Volume: -95.4% alization: Small (1-33%) ng: No ning: No Treatment Notes Wound #4 (Left, Lateral Malleolus) 2. Periwound Care Moisturizing lotion 3. Primary Dressing Applied Endoform Other primary dressing (specifiy in notes) 4. Secondary Dressing Dry Gauze Foam 6. Support Layer Applied 4 layer compression wrap Notes [employer] Ad[employer] account planner) Signed: 07/01/2019 5:34:21 PM By: Mikeal Hawthorne EMT/HBOT Entered By: Mikeal Hawthorne on 07/01/2019 16:24:37 -------------------------------------------------------------------------------- Wound Assessment Details Patient Name: Date of Service: [name], [name] 07/01/2019 11:00 AM Medical Record [id number] Patient Account Number:  [id number] Date of Birth/Sex: Treating RN: Aug 02, 1950 (69 y.o. M) Primary Care Tabb Croghan: PATIENT, NO Other Clinician: Referring Jerrica Thorman: Treating Janiyah Beery/Extender:Robson, Legrand Como None, Designated Weeks in Treatment: 31 Wound Status Wound Number: 7 Primary Skin Tear Etiology: Wound Location: Left Lower Leg - Medial, Distal Wound Open Wounding Event: Trauma Status: Date Acquired: 06/24/2019 Comorbid Arrhythmia, Coronary Artery Disease, Weeks Of Treatment: 1 History: Type II Diabetes Clustered Wound: No Photos Wound Measurements Length: (cm) 1.1 % Reduct Width: (cm) 0.2 % Reduct Depth: (cm) 0.1 Epitheli Area: (cm) 0.173 Tunneli Volume: (cm) 0.017 Undermi Wound Description Classification: Full Thickness Without Exposed Support Foul Od Structures Slough/ Wound Distinct, outline attached Margin: Exudate Small Amount: Exudate Serosanguineous Type: Exudate red, brown Color: Wound Bed Granulation Amount: Large (67-100%) Granulation Quality: Red Fascia Necrotic Amount: None Present (0%) Fat Lay Tendon Muscle Joint Ex Bone Exp or After Cleansing: No Fibrino No Exposed Structure Exposed: No er (Subcutaneous Tissue) Exposed: Yes Exposed: No Exposed: No posed: No osed: No ion in Area: 70.6% ion in Volume: 71.2% alization: None ng: No ning: No Treatment Notes Wound #7 (Left, Distal, Medial Lower Leg) 2. Periwound Care Moisturizing lotion 3. Primary Dressing Applied Endoform Other primary dressing (specifiy in notes) 4. Secondary Dressing Dry Gauze Foam 6. Support Layer Applied 4 layer compression wrap Notes [employer] Ad[employer] account planner) Signed: 07/01/2019 5:34:21 PM By: Mikeal Hawthorne EMT/HBOT Entered By: Mikeal Hawthorne on 07/01/2019 16:47:16 -------------------------------------------------------------------------------- Wound Assessment Details Patient Name: Date of Service: DARRIE, MACMILLAN 07/01/2019 11:00 AM Medical Record  [id number] Patient Account Number: [id number] Date of Birth/Sex: Treating RN: 08-Mar-1951 (69 y.o. M) Primary Care Cheryal Salas: PATIENT, NO Other Clinician: Referring Burrell Hodapp: Treating Avionna Bower/Extender:Robson, Legrand Como None, Designated Weeks in Treatment: 31 Wound Status Wound Number: 8 Primary Skin Tear Etiology: Wound Location: Left Lower Leg - Medial, Proximal Wound Open Wounding Event: Trauma Status: Date Acquired: 06/24/2019 Comorbid Arrhythmia, Coronary Artery Disease, Weeks Of Treatment: 1 History: Type II Diabetes Clustered Wound: No Photos Wound Measurements Length: (cm) 1.3 % Reduct Width: (cm) 0.7 % Reduct Depth: (cm) 0.1 Epitheli Area: (cm) 0.715 Tunneli Volume: (cm) 0.071 Undermi Wound Description Classification: Full Thickness Without Exposed Support Foul Od Structures Slough/ Wound Distinct, outline attached Margin: Exudate Small Amount: Exudate Serosanguineous Type: Exudate red, brown Color: Wound Bed Granulation Amount: Large (67-100%) Granulation Quality: Red Fascia Necrotic Amount: None Present (0%) Fat Lay Tendon Muscle Joint E Bone Ex or After Cleansing: No Fibrino No Exposed Structure Exposed: No er (Subcutaneous Tissue) Exposed: Yes Exposed: No Exposed: No xposed: No posed: No ion in Area: -264.8% ion in Volume: -255% alization: None ng: No ning: No Treatment Notes Wound #8 (Left, Proximal, Medial Lower Leg) 2. Periwound Care Moisturizing lotion 3. Primary Dressing Applied Endoform Other primary dressing (specifiy in notes) 4. Secondary Dressing Dry Gauze Foam 6. Support Layer Applied 4 layer compression wrap Notes [employer] Ad[employer] account planner) Signed: 07/01/2019 5:34:21 PM By: Mikeal Hawthorne EMT/HBOT Entered By: Mikeal Hawthorne on 07/01/2019 16:46:53 --------------------------------------------------------------------------------  Vitals Details Patient Name: Date of Service: SAMIL, MECHAM  07/01/2019 11:00 AM Medical Record [id number] Patient Account Number: [id number] Date of Birth/Sex: Treating RN: 19-Aug-1950 (69 y.o. [name] Primary Care Riverlyn Kizziah: PATIENT, NO Other Clinician: Referring Natacha Jepsen: Treating Kalmen Lollar/Extender:Robson, [name], Designated Weeks in Treatment: 31 Vital Signs Time Taken: 11:15 Temperature (F): 98.8 Height (in): 73 Pulse (bpm): 91 Weight (lbs): 205 Respiratory Rate (breaths/min): 18 Body Mass Index (BMI): 27 Blood Pressure (mmHg): 172/87 Reference Range: 80 - 120 mg / dl Electronic Signature(s) Signed: 07/01/2019 5:20:11 PM By: [name] Entered By: [name] on 07/01/2019 11:20:04

## 2019-07-08 ENCOUNTER — Other Ambulatory Visit: Payer: Self-pay

## 2019-07-08 ENCOUNTER — Encounter (HOSPITAL_BASED_OUTPATIENT_CLINIC_OR_DEPARTMENT_OTHER): Payer: Medicare Other | Attending: Internal Medicine | Admitting: Internal Medicine

## 2019-07-08 DIAGNOSIS — Z85828 Personal history of other malignant neoplasm of skin: Secondary | ICD-10-CM | POA: Diagnosis not present

## 2019-07-08 DIAGNOSIS — M86472 Chronic osteomyelitis with draining sinus, left ankle and foot: Secondary | ICD-10-CM | POA: Diagnosis not present

## 2019-07-08 DIAGNOSIS — I87332 Chronic venous hypertension (idiopathic) with ulcer and inflammation of left lower extremity: Secondary | ICD-10-CM | POA: Insufficient documentation

## 2019-07-08 DIAGNOSIS — L97321 Non-pressure chronic ulcer of left ankle limited to breakdown of skin: Secondary | ICD-10-CM | POA: Diagnosis not present

## 2019-07-08 NOTE — Progress Notes (Addendum)
[name], [name] ([id number]) Visit Report for 07/08/2019 Arrival Information Details Patient Name: Date of Service: [name], [name] 07/08/2019 2:45 PM Medical Record [id number] Patient Account Number: [id number] Date of Birth/Sex: Treating RN: [birth year] (69 y.o. [name] Primary Care Usiel Astarita: PATIENT, NO Other Clinician: Referring Jiah Bari: Treating Sunni [name]/Extender:Robson, [name], Designated Weeks in Treatment: 52 Visit Information History Since Last Visit Added or deleted any medications: No Patient Arrived: [name] Any new allergies or adverse reactions: No Arrival Time: 15:10 Had a fall or experienced change in No Accompanied By: self activities of daily living that may affect Transfer Assistance: None risk of falls: Patient Identification Verified: Yes Signs or symptoms of abuse/neglect since last No Secondary Verification Process Yes visito Completed: Hospitalized since last visit: No Patient Requires Transmission- No Implantable device outside of the clinic excluding No Based Precautions: cellular tissue based products placed in the center Patient Has Alerts: Yes ABIs 08/2018 L1.05 since last visit: Patient Alerts: Has Dressing in Place as Prescribed: Yes R0.76 Has Compression in Place as Prescribed: Yes Pain Present Now: Yes Electronic Signature(s) Signed: 07/08/2019 5:44:01 PM By: [name] Entered By: [name] on 07/08/2019 15:10:49 -------------------------------------------------------------------------------- Compression Therapy Details Patient Name: Date of Service: TREYSHAWN, MULDREW 07/08/2019 2:45 PM Medical Record [id number] Patient Account Number: [id number] Date of Birth/Sex: Treating RN: [birth year] (69 y.o. [name] Primary Care Averiana Clouatre: PATIENT, NO Other Clinician: Referring Luc Shammas: Treating Tikia Skilton/Extender:Robson, [name], Designated Weeks in Treatment: 32 Compression Therapy  Performed for Wound Wound #4 Left,Lateral Malleolus Assessment: Performed By: Clinician Baruch Gouty, RN Compression Type: Three Layer Post Procedure Diagnosis Same as Pre-procedure Electronic Signature(s) Signed: 07/08/2019 5:56:18 PM By: [name] Entered By: [name] on 07/08/2019 16:09:14 -------------------------------------------------------------------------------- Encounter Discharge Information Details Patient Name: Date of Service: [name], [name] 07/08/2019 2:45 PM Medical Record [id number] Patient Account Number: [id number] Date of Birth/Sex: Treating RN: 09/21/1950 (69 y.o. [name] Primary Care Shayley Medlin: PATIENT, NO Other Clinician: Referring Tadeusz Stahl: Treating Destinae Neubecker/Extender:Robson, [name], Designated Weeks in Treatment: 32 Encounter Discharge Information Items Post Procedure Vitals Discharge Condition: Stable Temperature (F): 97.7 Ambulatory Status: Ambulatory Pulse (bpm): 68 Discharge Destination: Home Respiratory Rate (breaths/min): 18 Transportation: Private Auto Blood Pressure (mmHg): 142/72 Accompanied By: self Schedule Follow-up Appointment: Yes Clinical Summary of Care: Patient Declined Electronic Signature(s) Signed: 07/08/2019 5:40:52 PM By: Baruch Gouty RN, BSN Entered By: Baruch Gouty on 07/08/2019 16:32:39 -------------------------------------------------------------------------------- Lower Extremity Assessment Details Patient Name: Date of Service: [name], [name] 07/08/2019 2:45 PM Medical Record [id number] Patient Account Number: [id number] Date of Birth/Sex: Treating RN: [birth year] (69 y.o. [name] Primary Care Arvle Grabe: PATIENT, NO Other Clinician: Referring Tayshaun Kroh: Treating Lizzett Nobile/Extender:Robson, [name], Designated Weeks in Treatment: 32 Edema Assessment Assessed: [Left: No] [Right: No] Edema: [Left: N] [Right: o] Calf Left: Right: Point of Measurement: 35 cm  From Medial Instep 32.5 cm cm Ankle Left: Right: Point of Measurement: 9 cm From Medial Instep 23 cm cm Vascular Assessment Pulses: Dorsalis Pedis Palpable: [Left:Yes] Electronic Signature(s) Signed: 07/08/2019 5:44:01 PM By: [name] Entered By: [name] on 07/08/2019 15:22:39 -------------------------------------------------------------------------------- Multi Wound Chart Details Patient Name: Date of Service: [name] 07/08/2019 2:45 PM Medical Record [id number] Patient Account Number: [id number] Date of Birth/Sex: Treating RN: 06/11/1950 (69 y.o. [name] Primary Care Etana Beets: PATIENT, NO Other Clinician: Referring Ginger Leeth: Treating Teran Knittle/Extender:Robson, [name], Designated Weeks in Treatment: 32 Vital Signs Height(in): 73 Pulse(bpm): 48 Weight(lbs): 205 Blood Pressure(mmHg): 142/72 Body Mass Index(BMI): 27 Temperature(F): 97.7 Respiratory 18 Rate(breaths/min): Photos: [4:No Photos] [  7:No Photos] [8:No Photos] Wound Location: [4:Left, Lateral Malleolus] [7:Left, Distal, Medial Lower Left, Proximal, Medial Lower Leg] [8:Leg] Wounding Event: [4:Trauma] [7:Trauma] [8:Trauma] Primary Etiology: [4:Venous Leg Ulcer] [7:Skin Tear] [8:Skin Tear] Comorbid History: [4:Arrhythmia, Coronary Artery Arrhythmia, Coronary Artery Arrhythmia, Coronary Artery Disease, Type II Diabetes Disease, Type II Diabetes Disease, Type II Diabetes] Date Acquired: [4:11/02/2018] [7:06/24/2019] [8:06/24/2019] Weeks of Treatment: [4:32] [7:2] [8:2] Wound Status: [4:Open] [7:Healed - Epithelialized] [8:Healed - Epithelialized] Measurements L x W x D 1.1x1.1x0.3 [7:0x0x0] [8:0x0x0] (cm) Area (cm) : [4:0.95] [7:0] [8:0] Volume (cm) : [4:0.285] [7:0] [8:0] % Reduction in Area: [4:26.70%] [7:100.00%] [8:100.00%] % Reduction in Volume: -119.20% [7:100.00%] [8:100.00%] Classification: [4:Full Thickness Without Exposed Support Structures Exposed Support  Structures Exposed Support Structures] [7:Full Thickness Without] [8:Full Thickness Without] Exudate Amount: [4:Medium] [7:None Present] [8:None Present] Exudate Type: [4:Serous] [7:N/A] [8:N/A] Exudate Color: [4:amber] [7:N/A] [8:N/A] Wound Margin: [4:Well defined, not attached Distinct, outline attached Distinct, outline attached] Granulation Amount: [4:Small (1-33%)] [7:None Present (0%)] [8:None Present (0%)] Granulation Quality: [4:Pink] [7:N/A] [8:N/A] Necrotic Amount: [4:Large (67-100%)] [7:None Present (0%)] [8:None Present (0%)] Exposed Structures: [4:Fat Layer (Subcutaneous Fascia: No Tissue) Exposed: Yes Fascia: No Tendon: No Muscle: No Joint: No Bone: No] [7:Fat Layer (Subcutaneous Fat Layer (Subcutaneous Tissue) Exposed: No Tendon: No Muscle: No Joint: No Bone: No] [8:Fascia: No Tissue)  Exposed: No Tendon: No Muscle: No Joint: No Bone: No] Epithelialization: [4:Small (1-33%)] [7:Large (67-100%)] [8:None] Debridement: [4:Debridement - Excisional N/A] [8:N/A] Pre-procedure [4:15:55] [7:N/A] [8:N/A] Verification/Time Out Taken: Pain Control: [4:Lidocaine 4% Topical Solution] [7:N/A] [8:N/A] Tissue Debrided: [4:Subcutaneous] [7:N/A] [8:N/A] Level: [4:Skin/Subcutaneous Tissue] [7:N/A] [8:N/A] Debridement Area (sq cm):1.21 [7:N/A] [8:N/A] Instrument: [4:Curette] [7:N/A] [8:N/A] Bleeding: [4:Moderate] [7:N/A] [8:N/A] Hemostasis Achieved: [4:Pressure] [7:N/A] [8:N/A] Procedural Pain: [4:0] [7:N/A] [8:N/A] Post Procedural Pain: [4:0] [7:N/A] [8:N/A] Debridement Treatment Procedure was tolerated [7:N/A] [8:N/A] Response: [4:well] Post Debridement [4:1.1x1.1x0.3] [7:N/A] [8:N/A] Measurements L x W x D (cm) Post Debridement [4:0.285] [7:N/A] [8:N/A] Volume: (cm) Procedures Performed: Cellular or Tissue Based [4:Product Compression Therapy Debridement] [7:N/A] [8:N/A] Treatment Notes Wound #4 (Left, Lateral Malleolus) 2. Periwound Care Moisturizing lotion TCA Cream 3. Primary  Dressing Applied Cellular Based Tissue Product 4. Secondary Dressing Dry Gauze Foam Drawtex 6. Support Layer Applied 4 layer compression wrap Notes oasis covered with adaptic and secured with Corporate investment banker) Signed: 07/08/2019 5:50:02 PM By: Linton Ham MD Signed: 07/08/2019 5:56:18 PM By: [name] Entered By: Linton Ham on 07/08/2019 17:21:34 -------------------------------------------------------------------------------- Multi-Disciplinary Care Plan Details Patient Name: Date of Service: [name], [name] 07/08/2019 2:45 PM Medical Record [id number] Patient Account Number: [id number] Date of Birth/Sex: Treating RN: 04/07/1951 (69 y.o. [name] Primary Care Stpehen Petitjean: PATIENT, NO Other Clinician: Referring Keishawn Rajewski: Treating Sigmond Patalano/Extender:Robson, [name], Designated Weeks in Treatment: 32 Active Inactive Venous Leg Ulcer Nursing Diagnoses: Knowledge deficit related to disease process and management Potential for venous Insuffiency (use before diagnosis confirmed) Goals: Patient will maintain optimal edema control Date Initiated: 11/20/2018 Target Resolution Date: 09/03/2019 Goal Status: Active Patient/caregiver will verbalize understanding of disease process and disease management Date Initiated: 11/20/2018 Date Inactivated: 12/17/2018 Target Resolution Date: 12/18/2018 Goal Status: Met Interventions: Assess peripheral edema status every visit. Compression as ordered Provide education on venous insufficiency Treatment Activities: Therapeutic compression applied : 11/20/2018 Notes: Electronic Signature(s) Signed: 07/08/2019 5:56:18 PM By: [name] Entered By: [name] on 07/08/2019 15:39:12 -------------------------------------------------------------------------------- Pain Assessment Details Patient Name: Date of Service: [name], [name] 07/08/2019 2:45 PM Medical Record [id number] Patient Account  Number: [id number] Date of Birth/Sex: Treating RN: March 03, 1951 (68 y.o.  [name] Primary Care Jackee Glasner: PATIENT, NO Other Clinician: Referring Rhyann Berton: Treating Marygrace Sandoval/Extender:Robson, [name], Designated Weeks in Treatment: 32 Active Problems Location of Pain Severity and Description of Pain Patient Has Paino Yes Site Locations Pain Location: Generalized Pain, Pain in Ulcers With Dressing Change: Yes Duration of the Pain. Constant / Intermittento Constant Rate the pain. Current Pain Level: 5 Worst Pain Level: 6 Least Pain Level: 8 Tolerable Pain Level: 5 Character of Pain Describe the Pain: Aching, Burning, Shooting, Stabbing Pain Management and Medication Current Pain Management: Medication: Yes Rest: Yes How does your wound impact your activities of daily livingo Sleep: Yes Electronic Signature(s) Signed: 07/08/2019 5:44:01 PM By: [name] Entered By: [name] on 07/08/2019 15:14:38 -------------------------------------------------------------------------------- Patient/Caregiver Education Details Patient Name: Date of Service: [name] 2/4/2021andnbsp2:45 PM Medical Record [id number] Patient Account Number: [id number] Date of Birth/Gender: Treating RN: 1950-07-17 (68 y.o. [name] Primary Care Physician: PATIENT, NO Other Clinician: Referring Physician: Treating Physician/Extender:Robson, [name], Designated Weeks in Treatment: 32 Education Assessment Education Provided To: Patient Education Topics Provided Wound/Skin Impairment: Handouts: Skin Care Do's and Dont's Methods: Explain/Verbal Responses: Reinforcements needed Electronic Signature(s) Signed: 07/08/2019 5:56:18 PM By: [name] Entered By: [name] on 07/08/2019 15:39:24 -------------------------------------------------------------------------------- Wound Assessment Details Patient Name: Date of Service: [name], [name] 07/08/2019 2:45 PM Medical Record [id number] Patient Account Number: [id number] Date of Birth/Sex: Treating RN: Oct 14, 1950 (69 y.o. [name] Primary Care Jaylenne Hamelin: PATIENT, NO Other Clinician: Referring Cylah Fannin: Treating Kentrel Clevenger/Extender:Robson, [name], Designated Weeks in Treatment: 32 Wound Status Wound Number: 4 Primary Venous Leg Ulcer Etiology: Wound Location: Left, Lateral Malleolus Wound Open Wounding Event: Trauma Status: Date Acquired: 11/02/2018 Comorbid Arrhythmia, Coronary Artery Disease, Weeks Of Treatment: 32 History: Type II Diabetes Clustered Wound: No Photos Wound Measurements Length: (cm) 1.1 % Reducti Width: (cm) 1.1 % Reducti Depth: (cm) 0.3 Epithelia Area: (cm) 0.95 Tunnel Volume: (cm) 0.285 Underm Wound Description Full Thickness Without Exposed Support Foul Od Classification: Structures Slough/ Wound Well defined, not attached Margin: Exudate Medium Amount: Exudate Serous Type: Exudate amber Color: Wound Bed Granulation Amount: Small (1-33%) Granulation Quality: Pink Fascia Necrotic Amount: Large (67-100%) Fat Lay Necrotic Quality: Adherent Slough Tendon Muscle Joint E Bone Ex or After Cleansing: No Fibrino Yes Exposed Structure Exposed: No er (Subcutaneous Tissue) Exposed: Yes Exposed: No Exposed: No xposed: No posed: No on in Area: 26.7% on in Volume: -119.2% lization: Small (1-33%) ing: No ining: No Treatment Notes Wound #4 (Left, Lateral Malleolus) 2. Periwound Care Moisturizing lotion TCA Cream 3. Primary Dressing Applied Cellular Based Tissue Product 4. Secondary Dressing Dry Gauze Foam Drawtex 6. Support Layer Applied 4 layer compression wrap Notes oasis covered with adaptic and secured with Corporate investment banker) Signed: 07/12/2019 4:47:04 PM By: Mikeal Hawthorne EMT/HBOT Signed: 07/12/2019 5:47:02 PM By: [name] Previous Signature: 07/08/2019 5:56:18 PM Version By:  [name] Entered By: Mikeal Hawthorne on 07/12/2019 15:23:54 -------------------------------------------------------------------------------- Wound Assessment Details Patient Name: Date of Service: [name], [name] 07/08/2019 2:45 PM Medical Record [id number] Patient Account Number: [id number] Date of Birth/Sex: Treating RN: 1951-04-30 (69 y.o. [name] Primary Care Khamya Topp: PATIENT, NO Other Clinician: Referring Yvonda Fouty: Treating Shaquita Fort/Extender:Robson, [name], Designated Weeks in Treatment: 32 Wound Status Wound Number: 7 Primary Skin Tear Etiology: Wound Location: Left Lower Leg - Medial, Distal Wound Healed - Epithelialized Wounding Event: Trauma Status: Date Acquired: 06/24/2019 Comorbid Arrhythmia, Coronary Artery Disease, Weeks Of Treatment: 2 History: Type II Diabetes Clustered Wound: No Photos Wound Measurements Length: (cm)  0 % Reduct Width: (cm) 0 % Reduct Depth: (cm) 0 Epitheli Area: (cm) 0 Tunneli Volume: (cm) 0 Undermi Wound Description Classification: Full Thickness Without Exposed Support Foul Odo Structures Slough/F Wound Distinct, outline attached Margin: Exudate None Present Amount: Wound Bed Granulation Amount: None Present (0%) Necrotic Amount: None Present (0%) Fascia E Fat Laye Tendon E Muscle E Joint Ex Bone Exp r After Cleansing: No ibrino No Exposed Structure xposed: No r (Subcutaneous Tissue) Exposed: No xposed: No xposed: No posed: No osed: No ion in Area: 100% ion in Volume: 100% alization: Large (67-100%) ng: No ning: No Electronic Signature(s) Signed: 07/12/2019 4:47:04 PM By: Mikeal Hawthorne EMT/HBOT Signed: 07/12/2019 5:47:02 PM By: [name] Previous Signature: 07/08/2019 5:56:18 PM Version By: [name] Entered By: Mikeal Hawthorne on 07/12/2019 15:25:34 -------------------------------------------------------------------------------- Wound Assessment Details Patient Name: Date of  Service: [name] 07/08/2019 2:45 PM Medical Record [id number] Patient Account Number: [id number] Date of Birth/Sex: Treating RN: [birth year] (69 y.o. [name] Primary Care Braedin Millhouse: PATIENT, NO Other Clinician: Referring German Manke: Treating Kingson Lohmeyer/Extender:Robson, [name], Designated Weeks in Treatment: 32 Wound Status Wound Number: 8 Primary Skin Tear Etiology: Wound Location: Left Lower Leg - Medial, Proximal Wound Healed - Epithelialized Wounding Event: Trauma Status: Date Acquired: 06/24/2019 Comorbid Arrhythmia, Coronary Artery Disease, Weeks Of Treatment: 2 History: Type II Diabetes Clustered Wound: No Photos Wound Measurements Length: (cm) 0 % Reduct Width: (cm) 0 % Reduct Depth: (cm) 0 Epitheli Area: (cm) 0 Tunneli Volume: (cm) 0 Undermi Wound Description Full Thickness Without Exposed Support Foul Odo Classification: Structures Slough/F Wound Distinct, outline attached Margin: Exudate None Present Amount: Wound Bed Granulation Amount: None Present (0%) Necrotic Amount: None Present (0%) Fascia E Fat Laye Tendon E Muscle E Joint Ex Bone Exp r After Cleansing: No ibrino No Exposed Structure xposed: No r (Subcutaneous Tissue) Exposed: No xposed: No xposed: No posed: No osed: No ion in Area: 100% ion in Volume: 100% alization: None ng: No ning: No Electronic Signature(s) Signed: 07/12/2019 4:47:04 PM By: Mikeal Hawthorne EMT/HBOT Signed: 07/12/2019 5:47:02 PM By: [name] Previous Signature: 07/08/2019 5:56:18 PM Version By: [name] Entered By: Mikeal Hawthorne on 07/12/2019 15:24:41 -------------------------------------------------------------------------------- Vitals Details Patient Name: Date of Service: [name] 07/08/2019 2:45 PM Medical Record [id number] Patient Account Number: [id number] Date of Birth/Sex: Treating RN: 1950/07/16 (69 y.o. [name] Primary Care Earley Grobe:  PATIENT, NO Other Clinician: Referring Norwin Aleman: Treating Chayse Gracey/Extender:Robson, [name], Designated Weeks in Treatment: 32 Vital Signs Time Taken: 15:10 Temperature (F): 97.7 Height (in): 73 Pulse (bpm): 68 Weight (lbs): 205 Respiratory Rate (breaths/min): 18 Body Mass Index (BMI): 27 Blood Pressure (mmHg): 142/72 Reference Range: 80 - 120 mg / dl Electronic Signature(s) Signed: 07/08/2019 5:44:01 PM By: [name] Entered By: [name] on 07/08/2019 15:33:07

## 2019-07-08 NOTE — Progress Notes (Addendum)
PHOENIX, DREY ([id number]) Visit Report for 07/08/2019 Cellular or Tissue Based Product Details Patient Name: Date of Service: [name] 07/08/2019 2:45 PM Medical Record Number: [id number] Patient Account Number: [id number] Date of Birth/Sex: Treating RN: [birth year] (69 y.o. [name] Primary Care Provider: PA Haig Prophet, NO Other Clinician: Referring Provider: Treating Provider/Extender: Silvano Rusk, Designated Weeks in Treatment: 32 Cellular or Tissue Based Product Type Wound #4 Left,Lateral Malleolus Applied to: Performed By: Physician Ricard Dillon., MD Cellular or Tissue Based Product Type: Oasis Burn Matrix Level of Consciousness (Pre-procedure): Awake and Alert Pre-procedure Verification/Time Out Yes - 16:00 Taken: Location: trunk / arms / legs Wound Size (sq cm): 1.21 Product Size (sq cm): 10 Waste Size (sq cm): 5 Waste Reason: wound size Amount of Product Applied (sq cm): 5 Instrument Used: Forceps, Scissors Lot #: [id number] Order #: 1 Expiration Date: 06/05/2020 Fenestrated: No Reconstituted: Yes Solution Type: normal saline Solution Amount: 72mL Lot #: [id number] Solution Expiration Date: 01/31/2021 Secured: Yes Secured With: Steri-Strips, adaptic Dressing Applied: No Procedural Pain: 0 Post Procedural Pain: 0 Response to Treatment: Procedure was tolerated well Level of Consciousness (Post- Awake and Alert procedure): Post Procedure Diagnosis Same as Pre-procedure Electronic Signature(s) Signed: 07/08/2019 5:50:02 PM By: Linton Ham MD Signed: 07/08/2019 5:56:18 PM By: [name] Entered By: [name] on 07/08/2019 16:08:57 -------------------------------------------------------------------------------- Debridement Details Patient Name: Date of Service: Michael Coho F. 07/08/2019 2:45 PM Medical Record Number: [id number] Patient Account Number: [id number] Date of Birth/Sex: Treating RN: [birth year] (69 y.o. [name] Primary Care Provider: PA Haig Prophet, NO Other Clinician: Referring Provider: Treating Provider/Extender: Silvano Rusk, Designated Weeks in Treatment: 32 Debridement Performed for Assessment: Wound #4 Left,Lateral Malleolus Performed By: Physician Ricard Dillon., MD Debridement Type: Debridement Severity of Tissue Pre Debridement: Fat layer exposed Level of Consciousness (Pre-procedure): Awake and Alert Pre-procedure Verification/Time Out Yes - 15:55 Taken: Start Time: 15:56 Pain Control: Lidocaine 4% T opical Solution T Area Debrided (L x W): otal 1.1 (cm) x 1.1 (cm) = 1.21 (cm) Tissue and other material debrided: Viable, Non-Viable, Subcutaneous, Skin: Dermis , Fibrin/Exudate Level: Skin/Subcutaneous Tissue Debridement Description: Excisional Instrument: Curette Bleeding: Moderate Hemostasis Achieved: Pressure End Time: 16:00 Procedural Pain: 0 Post Procedural Pain: 0 Response to Treatment: Procedure was tolerated well Level of Consciousness (Post- Awake and Alert procedure): Post Debridement Measurements of Total Wound Length: (cm) 1.1 Width: (cm) 1.1 Depth: (cm) 0.3 Volume: (cm) 0.285 Character of Wound/Ulcer Post Debridement: Improved Severity of Tissue Post Debridement: Fat layer exposed Post Procedure Diagnosis Same as Pre-procedure Electronic Signature(s) Signed: 07/08/2019 5:50:02 PM By: Linton Ham MD Signed: 07/08/2019 5:56:18 PM By: [name] Entered By: Linton Ham on 07/08/2019 17:21:51 -------------------------------------------------------------------------------- HPI Details Patient Name: Date of Service: [name], Michael RLES F. 07/08/2019 2:45 PM Medical Record Number: [id number] Patient Account Number: [id number] Date of Birth/Sex: Treating RN: 09-Nov-1950 (69 y.o. [name] Primary Care Provider: PA Haig Prophet, NO Other Clinician: Referring Provider: Treating Provider/Extender: Silvano Rusk, Designated Weeks in  Treatment: 32 History of Present Illness HPI Description: 08/22/17-He is here for initial evaluation of a right heel wound, right toe wound and left heel fissures. He states that he has chronic dry, flaking skin and frequently has cracked heels. He states the right heel wound developed as such but he developed the wound approximately 3 weeks ago when pulling some skin off; he was unaware of the right toe ulcer. He was treating the right heel with over-the-counter triple antibiotic  ointment and peroxide. He presented to urgent care on 3/12 with a 2 x 2 centimeter ulceration to the right heel and was discharged with a 10 day prescription for Bactrim. He does have a history of neuropathy, diet controlled diabetic. His does not know his most recent A1c but will be seeing his PCP next week, there is no evidence of an A1c in Epic. He denies smoking, does have a history of alcohol use, he admits to 5+/- beers weekly not daily. He did follow-up with cardiology on 3/18 with c/o ble edema, they ordered DVT study which he completed on 3/21. He reports that the bilateral DVT study was negative per the ultrasound technician, no report is available at this time. READMISSION 11/20/2018 this is a now 69 year old man who is here for review of wounds on his lower extremity on the left. We note that he was here on a single visit in March he tells [place] that over the last 2019. His major issue is that he dropped a box and hit the outside of his left lateral malleolus about 3 weeks ago. This is left him with a nonhealing area. He has been using Iodosorb ointment he had apparently from the last stay in this clinic. Also of note over the last 6 weeks he has noted an area that is raised and will scab over but then he traumatizes a scab and then this bleeds and reopens. This is on the left posterior calf. All of this appears to be on the background of chronic venous insufficiency. He has had DVT studies in the past that were  negative in 2019. He is also had arterial studies that showed an ABI on the right of 0.76 and on the left of 1.05. Past medical history; the patient states that he is not a diabetic although I note there is some suggested he was in the note from last year. He has a history of coronary artery disease. He tells me that he has had a history of skin cancer but he is not sure which one but that includes one on the left upper thigh. ABIs done previously 0.76 on the right and 1.05 on the left 6/26; patient readmitted to the clinic last week. He had a traumatic area over the left lateral malleolus as well as a hyper granulated growth on the left posterior calf. He also has severe chronic venous insufficiency. 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 We have been using Iodoflex to the traumatic wound under compression. 7/9; area over the left lateral malleolus continues to require debridement and we are continuing with Iodoflex under compression. He has severe chronic venous insufficiency. 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 This patient has severe bilateral venous insufficiency and venous hypertension with skin changes resulting. He requires bilateral reflux studies Finally he traumatized his right lateral calf while doing yard work about a week ago. The area is small with a flap of skin over the majority of the wound but I am not sure this is going to remain viable 7/16; still not a viable surface on the left lateral malleolus in fact this wound is deeper. We have been using Iodoflex. 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 He has severe bilateral venous insufficiency and I have ordered reflux studies these  apparently are booked for next week. Our intake nurse noted some yellowish-green drainage coming from the wound on the left ankle. Patient was insistent on talking about doing carotid ultrasounds and he was hoping to get these done with his reflux studies next week. I spent a few minutes talking to him about this I just could not put together in a logical way what he is concerned about. At the end of this I asked him to see his primary doctor about this he says he does not have a primary doctor advised him to get a primary doctor to go over this with him. It was not really clear where the level of concern was specific to carotid ultrasound 7/23; a better looking surface on the left lateral malleolus although it still requires debridement. He tells me he has an appointment to Mohs surgery center next week. Small area on the right lateral calf. The patient went to have his reflux studies at [facility] I do not think they took his compression wraps off. He did have reflux on the left in the common femoral greater saphenous and saphenofemoral junction and the great saphenous vein in the proximal thigh. 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 Looking at the numbers it would appear that the maximal reflux was in the great saphenous vein in the proximal thigh. As usual I am not sure what would be possible to do here. I will send him to vascular surgery 7/30; the patient still requires debridement in the left lateral malleolus although in general the wound is still deep and and punched out. We are using Sorbact. The area on the right lateral calf is improved. 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 The patient does not have a vascular surgery appointment nor does he have a skin surgery center appointment. I am really not sure what the issues are  here although I know vascular surgery as well behind. 8/6-Patient does not have a vascular appointment nor does he have a skin surgery appointment firmed up at this time although he says he is called them both, impressed upon him that he should see whoever is available for the vascular so the vein studies can be interpreted. 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 He is here for the left lateral malleoli wound for which we are using SOrbact, and he is disappointed the wound is not healing 8/21; patient saw Dr. Doren Custard on 01/21/2019. Dr. Doren Custard was concerned about his arterial flow. Noting that he had trouble feeling the dorsalis pedis and posterior tibial arteries also noteworthy that the Doppler had a barely biphasic posterior tibial signal with a monophasic but fairly brisk dorsalis pedis signal. He is booked for an angiogram next Friday. We have been using 3 layer compression on  him. 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 Given Dr. Mee Hives concern it was difficult for me to be comfortable with him going ahead with the cancer extraction and I have suggested canceling this. Finally he has an increase in the erythematous contact dermatitis in the entire wrapped area of the left leg which is probably an allergy to the contact layer of the 3 layer. 8/27; he is going for his angiogram tomorrow by Dr. Doren Custard. We cancel the Mohs surgery on the tumor on his posterior calf pending the angiogram tomorrow. If this shows  adequate blood flow to this area I think we can rebook this. His main wound is on the left lateral malleolus a small refractory punched-out area. We have been using Hydrofera Blue 9/3; his angiogram was done as scheduled. He does indeed have PAD. The common femoral, deep femoral superficial femoral and popliteal arteries will are all widely patent. 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 The overall thought was that he had enough adequate circulation to heal his venous ulcer and continue to use mild elevation and compression. Noted that he had biphasic posterior tibial signals with a Doppler ABI of 0.91. It was not felt that the stenosis in the proximal tibial artery would be associated with increased blood flow he will follow-up with Dr. Doren Custard in 1 month He now needs to get the skin surgery appointment set up. The area is growing larger and bleeding frequently on the posterior left calf per the patient 02/11/19-Patient is back after 1 week, results of his vascular studies are noted, patient needs to have a skin surgery appointment established apparently he did not return the calls this week. The area on the posterior calf is where the skin surgery needs to be done, the left lateral malleoli are wound appears to be about the same we will using Hydrofera Blue change 9/24; patient finally has a booking to deal with the cancer on his posterior calf I believe with the skin surgery center. Not much change in the wound over the left lateral malleolus. It is been 2 weeks since he was here. I changed him to endoform last time 10/2; the patient has been to see dermatology. Scheduled for surgery on 10/14. Using endoform to the wound over the lateral malleolus some improvement in the wound condition but not the surface  area. He has severe chronic venous insufficiency. He also has some arterial disease he was angiogram by Dr. Doren Custard. He did not think that anything needed to be done from an intervention point of view. 10/8; his surgery is still scheduled for 10/14. It is concerning about whether he is healed or maintained skin integrity and is sutured area given the severity of his chronic venous insufficiency. The area we are following is on the left lateral malleolus. Some improvement in the surface area. No debridement today we have been using endoform 10/15; the patient finally had his surgery for the tumor on his left posterior calf. We did not look at this today. Still a punched out area in the lower left malleolus. Perhaps some mild improvement we have been using endoform We were denied for Grafix and still have not heard back on Oasis 10/22; no real change in this wound.  I am able to debride it to a healthy looking surface but the next week there is recurrent debris and no improvement in depth. Punched out over the left lateral malleolus. We have not heard back on Oasis. We were denied for Grafix 11/6; wound is perhaps slightly smaller. Still punched out. He was approved for Oasis we applied Oasis #1. His surgical wound on the back of the left calf was reviewed by dermatology we are not reviewing this 11/13; I think the wound is filled in somewhat. Certainly a much more vibrant looking surface. Oasis #2. I looked at his surgical site on the back of the calf today for the first time. Clean incision small open areas 11/19; measurements not much different today. Still 0.4 cm in depth although post debridement the wound surface looks better. Oasis #3 the site on the back of his calf looks satisfactory. I put him in 4 layer compression last week he seems to tolerate this. He is complaining of some tenderness around the wound. This was controlled swelling around the wound. He seems to have tolerated this 04/28/2019  on evaluation today patient appears to be doing slightly better based on measurements compared to last week. He is here for Oasis application #4 to the ankle region. Fortunately there is no signs of other wounds open at this point and no signs of infection. 12/3; Oasis #5. We have a better looking wound surface but not much change in depth at 0.6 cm. 12/11; Oasis #6 again some improvement this week better looking surface. Perhaps some improvement in depth 12/17 Oasis number 7.3 cm in depth 05/26/2019 patient is here for Oasis application #8 in regard to his wound. He is showing evidence of some improvement right now with the Oasis. He did also go for his x-ray today although the results are not yet available in epic for review. 12/30- Patient returns at 1 week for Oasis application #9 with regard to his left lower extremity foot wound, which is showing some evidence of improvement, the x-ray that was done last week did show slight periosteal elevation and therefore he would be in line to get an MRI to confirm these findings 06/10/2019; the x-ray that the patient had done showed cortical irregularity and suggested an MRI. An MRI was ordered when he was here I believe on 12/23 but we still do not have an appointment for this. I did 9 Oasis on this man and really the improvement is quite dramatic however it is still not closed but certainly a lot better looking than say 5 to 6 weeks ago. The patient is fixated on Apligraf. I told him that I will not consider ordering this until the MRI is done. Today we put endoform on the wound 1/15; the MRI that I ordered was finally done. Unfortunately it actually posed a difficult question rather than answering it. It was felt that there was subtle periosteal reaction and minimal focal edema in the lateral malleolus. The major differential would be reactive findings rather than necessarily representing early active osteomyelitis. Given the original depth of this wound  however I do not know how I can exclude the osteomyelitis and the consequences of not treating this may be disastrous. 1/21; the patient arrives in clinic today unfortunately had scissor injuries to his upper left medial and mid calf. The worst one here is the mid calf. His wound actually continues to contract. He is taking Levaquin which I am trying to get into him for a month. He  is says he is having 1-2 loose stools a day but this does not appear to be watery diarrhea. He is also strongly requesting another round of Oasis. This is after I managed to talk him out of Apligraf. Frankly the wound is too small for an Apligraf now 1/28; I am continuing Levaquin for another 2 weeks. He appears to be tolerating this well without side effects. Small punched out area on the left lateral malleolus. Debris still present we have been using endoform 2/4; he continues on the Levaquin which he appears to be tolerating well. Still the punched-out area on the left lateral malleolus. The scissor injuries from last week appear to be doing better. Electronic Signature(s) Signed: 07/08/2019 5:50:02 PM By: Linton Ham MD Entered By: Linton Ham on 07/08/2019 17:30:55 -------------------------------------------------------------------------------- Physical Exam Details Patient Name: Date of Service: Michael Coho F. 07/08/2019 2:45 PM Medical Record Number: [id number] Patient Account Number: [id number] Date of Birth/Sex: Treating RN: 01-31-1951 (69 y.o. [name] Primary Care Provider: PA Haig Prophet, NO Other Clinician: Referring Provider: Treating Provider/Extender: Silvano Rusk, Designated Weeks in Treatment: 32 Constitutional Sitting or standing Blood Pressure is within target range for patient.. Pulse regular and within target range for patient.Marland Kitchen Respirations regular, non-labored and within target range.. Temperature is normal and within the target range for the patient.Marland Kitchen Appears in no  distress. Notes Wound exam left lateral malleolus. Not quite as good as last week. Debris in the center I removed with a #3 curette. There is some erythema around their wound that I was worried about although this is not overtly infected The scissor injury superiorly on the right upper and right distal calf looks mostly epithelialized. Electronic Signature(s) Signed: 07/08/2019 5:50:02 PM By: Linton Ham MD Entered By: Linton Ham on 07/08/2019 17:32:00 -------------------------------------------------------------------------------- Physician Orders Details Patient Name: Date of Service: Michael Coho F. 07/08/2019 2:45 PM Medical Record Number: [id number] Patient Account Number: [id number] Date of Birth/Sex: Treating RN: 1951-02-22 (69 y.o. 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 Dressing Change Frequency Wound #4 Left,Lateral Malleolus Do not change entire dressing for one week. Skin Barriers/Peri-Wound Care TCA Cream or Ointment - liberal to lower leg Wound Cleansing Wound #4 Left,Lateral Malleolus May shower with protection. Primary Wound Dressing Wound #4 Left,Lateral Malleolus pplication - oasis burn matrix applied by MD. drawtex applied over oasis. secured with adaptic and steri-strips. leave in place. Skin Substitute A Other: Secondary Dressing Wound #4 Left,Lateral  Malleolus Foam - foam donut. Dry Gauze Other: - pad the left lateral and anterior portion of lower leg. also pad healed areas to medial leg. Edema Control 4 layer compression: Left lower extremity - 4 press - no cotton or kerlix layer Avoid standing for long periods of time Elevate legs to the level of the heart or above for 30 minutes daily and/or when sitting, a frequency of: - throughout the day Exercise regularly Electronic Signature(s) Signed: 07/08/2019 5:50:02 PM By: Linton Ham MD Signed: 07/08/2019 5:56:18 PM By: [name] Entered By: [name] on 07/08/2019  17:16:25 -------------------------------------------------------------------------------- Problem List Details Patient Name: Date of Service: [name] 07/08/2019 2:45 PM Medical Record Number: [id number] Patient Account Number: [id number] Date of Birth/Sex: Treating RN: 02-08-1951 (69 y.o. 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, left ankle and foot 06/18/2019 No Yes L97.228 Non-pressure chronic ulcer of left calf with other specified severity 06/24/2019 No Yes Inactive Problems ICD-10 Code Description Active Date Inactive Date C44.799 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 07/08/2019 2:45 PM Medical Record Number: [id number] Patient Account Number: [id number] Date of Birth/Sex: Treating RN: [birth year] (69 y.o. [name] Primary Care Provider: PA Haig Prophet, NO Other Clinician: Referring Provider: Treating Provider/Extender: Silvano Rusk, Designated Weeks in Treatment: 32 Subjective History of Present Illness (HPI) 08/22/17-He is here for initial evaluation of a right heel wound, right toe wound and left heel fissures. He states that he has chronic dry, flaking skin and frequently has cracked heels. He states the right heel wound developed as such but he developed the wound approximately 3 weeks ago when pulling some skin off; he was unaware of the right toe ulcer. He was treating the right heel with over-the-counter triple antibiotic ointment and peroxide. He presented to urgent care on 3/12 with a 2 x 2 centimeter ulceration to the right heel and was discharged with a 10 day prescription for Bactrim. He does have a history of neuropathy, diet controlled diabetic. His does not know his most recent A1c but will be seeing his PCP next week, there is no evidence of an A1c in Epic. He denies smoking, does have a history of alcohol use, he admits to 5+/- beers weekly not daily. He did follow-up with cardiology on 3/18 with c/o ble edema, they ordered DVT study which he completed on 3/21. He reports that the bilateral DVT study was negative per the ultrasound technician, no report is available at this time. READMISSION 11/20/2018 this is a now 69 year old man who is here for review of wounds on his lower extremity on the left. We note that he was here on a single visit in March he tells [place] that over the last 2019. His major issue is that he dropped a box  and hit the outside of his left lateral malleolus about 3 weeks ago. This is left him with a nonhealing area. He has been using Iodosorb ointment he had apparently from the last stay in this clinic. Also of note over the last 6 weeks he has noted an area that is raised and will scab over but then he traumatizes a scab and then this bleeds and reopens. This is on the left posterior calf. All of this appears to be on the background of chronic venous insufficiency. He has had DVT studies in the past that were negative in 2019. He is also  had arterial studies that showed an ABI on the right of 0.76 and on the left of 1.05. Past medical history; the patient states that he is not a diabetic although I note there is some suggested he was in the note from last year. He has a history of coronary artery disease. He tells me that he has had a history of skin cancer but he is not sure which one but that includes one on the left upper thigh. ABIs done previously 0.76 on the right and 1.05 on the left 6/26; patient readmitted to the clinic last week. He had a traumatic area over the left lateral malleolus as well as a hyper granulated growth on the left posterior calf. He also has severe chronic venous insufficiency. 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 We have been using Iodoflex to the traumatic wound under compression. 7/9; area over the left lateral malleolus continues to require debridement and we are continuing with Iodoflex under compression. He has severe chronic venous insufficiency. 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 This patient has severe bilateral venous insufficiency and venous hypertension with skin changes resulting. He requires bilateral reflux studies Finally he traumatized his right lateral calf while doing  yard work about a week ago. The area is small with a flap of skin over the majority of the wound but I am not sure this is going to remain viable 7/16; still not a viable surface on the left lateral malleolus in fact this wound is deeper. We have been using Iodoflex. 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 He has severe bilateral venous insufficiency and I have ordered reflux studies these apparently are booked for next week. Our intake nurse noted some yellowish-green drainage coming from the wound on the left ankle. Patient was insistent on talking about doing carotid ultrasounds and he was hoping to get these done with his reflux studies next week. I spent a few minutes talking to him about this I just could not put together in a logical way what he is concerned about. At the end of this I asked him to see his primary doctor about this he says he does not have a primary doctor advised him to get a primary doctor to go over this with him. It was not really clear where the level of concern was specific to carotid ultrasound 7/23; a better looking surface on the left lateral malleolus although it still requires debridement. He tells me he has an appointment to Mohs surgery center next week. Small area on the right lateral calf. The patient went to have [facility] I do not think they took his compression wraps off. He did have reflux on the left in the common femoral greater saphenous and saphenofemoral junction and the great saphenous vein in the proximal thigh. 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 Looking at the numbers it would appear that the maximal reflux was in the great saphenous vein in the proximal thigh. As usual  I am not sure  what would be possible to do here. I will send him to vascular surgery 7/30; the patient still requires debridement in the left lateral malleolus although in general the wound is still deep and and punched out. We are using Sorbact. The area on the right lateral calf is improved. 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 The patient does not have a vascular surgery appointment nor does he have a skin surgery center appointment. I am really not sure what the issues are here although I know vascular surgery as well behind. 8/6-Patient does not have a vascular appointment nor does he have a skin surgery appointment firmed up at this time although he says he is called them both, impressed upon him that he should see whoever is available for the vascular so the vein studies can be interpreted. 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 He is here for the left lateral malleoli wound for which we are using SOrbact, and he is disappointed the wound is not healing 8/21; patient saw Dr. Doren Custard on 01/21/2019. Dr. Doren Custard was concerned about his arterial flow. Noting that he had trouble feeling the dorsalis pedis and posterior tibial arteries also noteworthy that the Doppler had a barely biphasic posterior tibial signal with a monophasic but fairly brisk dorsalis pedis signal. He is booked for an angiogram next Friday. We have been using 3 layer compression on him. Also he is supposed to have Mohs surgery on the cancer site on his left posterior calf next  Wednesday. Given Dr. Mee Hives concern it was difficult for me to be comfortable with him going ahead with the cancer extraction and I have suggested canceling this. Finally he has an increase in the erythematous contact dermatitis in the entire wrapped area of the left leg which is probably an allergy to the contact layer of the 3 layer. 8/27; he is going for his angiogram tomorrow by Dr. Doren Custard. We cancel the Mohs surgery on the tumor on his posterior calf pending the angiogram tomorrow. If this shows adequate blood flow to this area I think we can rebook this. His main wound is on the left lateral malleolus a small refractory punched-out area. We have been using Hydrofera Blue 9/3; his angiogram was done as scheduled. He does indeed have PAD. The common femoral, deep femoral superficial femoral and popliteal arteries will are all widely patent. 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 The overall thought was that he had enough adequate circulation to heal his venous ulcer and continue to use mild elevation and compression. Noted that he had biphasic posterior tibial signals with a Doppler ABI of 0.91. It was not felt that the stenosis in the proximal tibial artery would be associated with increased blood flow he will follow-up with Dr. Doren Custard in 1 month He now needs to get the skin  surgery appointment set up. The area is growing larger and bleeding frequently on the posterior left calf per the patient 02/11/19-Patient is back after 1 week, results of his vascular studies are noted, patient needs to have a skin surgery appointment established apparently he did not return the calls this week. The area on the posterior calf is where the skin surgery needs to be done, the left lateral malleoli are wound appears to be  about the same we will using Hydrofera Blue change 9/24; patient finally has a booking to deal with the cancer on his posterior calf I believe with the skin surgery center. Not much change in the wound over the left lateral malleolus. It is been 2 weeks since he was here. I changed him to endoform last time 10/2; the patient has been to see dermatology. Scheduled for surgery on 10/14. Using endoform to the wound over the lateral malleolus some improvement in the wound condition but not the surface area. He has severe chronic venous insufficiency. He also has some arterial disease he was angiogram by Dr. Doren Custard. He did not think that anything needed to be done from an intervention point of view. 10/8; his surgery is still scheduled for 10/14. It is concerning about whether he is healed or maintained skin integrity and is sutured area given the severity of his chronic venous insufficiency. The area we are following is on the left lateral malleolus. Some improvement in the surface area. No debridement today we have been using endoform 10/15; the patient finally had his surgery for the tumor on his left posterior calf. We did not look at this today. Still a punched out area in the lower left malleolus. Perhaps some mild improvement we have been using endoform We were denied for Grafix and still have not heard back on Oasis 10/22; no real change in this wound. I am able to debride it to a healthy looking surface but the next week there is recurrent debris and no improvement in depth. Punched out over the left lateral malleolus. We have not heard back on Oasis. We were denied for Grafix 11/6; wound is perhaps slightly smaller. Still punched out. He was approved for Oasis we applied Oasis #1. His surgical wound on the back of the left calf was reviewed by dermatology we are not reviewing this 11/13; I think the wound is filled in somewhat. Certainly a much more vibrant looking surface. Oasis #2. I looked at  his surgical site on the back of the calf today for the first time. Clean incision small open areas 11/19; measurements not much different today. Still 0.4 cm in depth although post debridement the wound surface looks better. Oasis #3 the site on the back of his calf looks satisfactory. I put him in 4 layer compression last week he seems to tolerate this. He is complaining of some tenderness around the wound. This was controlled swelling around the wound. He seems to have tolerated this 04/28/2019 on evaluation today patient appears to be doing slightly better based on measurements compared to last week. He is here for Oasis application #4 to the ankle region. Fortunately there is no signs of other wounds open at this point and no signs of infection. 12/3; Oasis #5. We have a better looking wound surface but not much change in depth at 0.6 cm. 12/11; Oasis #6 again some improvement this week better looking surface. Perhaps some improvement in depth 12/17 Oasis number 7.3 cm in depth 05/26/2019 patient is here for  Oasis application #8 in regard to his wound. He is showing evidence of some improvement right now with the Oasis. He did also go for his x-ray today although the results are not yet available in epic for review. 12/30- Patient returns at 1 week for Oasis application #9 with regard to his left lower extremity foot wound, which is showing some evidence of improvement, the x-ray that was done last week did show slight periosteal elevation and therefore he would be in line to get an MRI to confirm these findings 06/10/2019; the x-ray that the patient had done showed cortical irregularity and suggested an MRI. An MRI was ordered when he was here I believe on 12/23 but we still do not have an appointment for this. I did 9 Oasis on this man and really the improvement is quite dramatic however it is still not closed but certainly a lot better looking than say 5 to 6 weeks ago. The patient is fixated  on Apligraf. I told him that I will not consider ordering this until the MRI is done. Today we put endoform on the wound 1/15; the MRI that I ordered was finally done. Unfortunately it actually posed a difficult question rather than answering it. It was felt that there was subtle periosteal reaction and minimal focal edema in the lateral malleolus. The major differential would be reactive findings rather than necessarily representing early active osteomyelitis. Given the original depth of this wound however I do not know how I can exclude the osteomyelitis and the consequences of not treating this may be disastrous. 1/21; the patient arrives in clinic today unfortunately had scissor injuries to his upper left medial and mid calf. The worst one here is the mid calf. His wound actually continues to contract. He is taking Levaquin which I am trying to get into him for a month. He is says he is having 1-2 loose stools a day but this does not appear to be watery diarrhea. He is also strongly requesting another round of Oasis. This is after I managed to talk him out of Apligraf. Frankly the wound is too small for an Apligraf now 1/28; I am continuing Levaquin for another 2 weeks. He appears to be tolerating this well without side effects. Small punched out area on the left lateral malleolus. Debris still present we have been using endoform 2/4; he continues on the Levaquin which he appears to be tolerating well. Still the punched-out area on the left lateral malleolus. ooThe scissor injuries from last week appear to be doing better. Objective Constitutional Sitting or standing Blood Pressure is within target range for patient.. Pulse regular and within target range for patient.Marland Kitchen Respirations regular, non-labored and within target range.. Temperature is normal and within the target range for the patient.Marland Kitchen Appears in no distress. Vitals Time Taken: 3:10 PM, Height: 73 in, Weight: 205 lbs, BMI: 27,  Temperature: 97.7 F, Pulse: 68 bpm, Respiratory Rate: 18 breaths/min, Blood Pressure: 142/72 mmHg. General Notes: Wound exam left lateral malleolus. Not quite as good as last week. Debris in the center I removed with a #3 curette. There is some erythema around their wound that I was worried about although this is not overtly infected ooThe scissor injury superiorly on the right upper and right distal calf looks mostly epithelialized. Integumentary (Hair, Skin) Wound #4 status is Open. Original cause of wound was Trauma. The wound is located on the Left,Lateral Malleolus. The wound measures 1.1cm length x 1.1cm width x 0.3cm depth; 0.95cm^2 area  and 0.285cm^3 volume. There is Fat Layer (Subcutaneous Tissue) Exposed exposed. There is no tunneling or undermining noted. There is a medium amount of serous drainage noted. The wound margin is well defined and not attached to the wound base. There is small (1-33%) pink granulation within the wound bed. There is a large (67-100%) amount of necrotic tissue within the wound bed including Adherent Slough. Wound #7 status is Healed - Epithelialized. Original cause of wound was Trauma. The wound is located on the Left,Distal,Medial Lower Leg. The wound measures 0cm length x 0cm width x 0cm depth; 0cm^2 area and 0cm^3 volume. There is no tunneling or undermining noted. There is a none present amount of drainage noted. The wound margin is distinct with the outline attached to the wound base. There is no granulation within the wound bed. There is no necrotic tissue within the wound bed. Wound #8 status is Healed - Epithelialized. Original cause of wound was Trauma. The wound is located on the Left,Proximal,Medial Lower Leg. The wound measures 0cm length x 0cm width x 0cm depth; 0cm^2 area and 0cm^3 volume. There is no tunneling or undermining noted. There is a none present amount of drainage noted. The wound margin is distinct with the outline attached to the  wound base. There is no granulation within the wound bed. There is no necrotic tissue within the wound bed. 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 There was a Excisional Skin/Subcutaneous Tissue Debridement with a total area of 1.21 sq cm performed by Ricard Dillon., MD. With the following instrument(s): Curette to remove Viable and Non-Viable tissue/material. Material removed includes Subcutaneous Tissue, Skin: Dermis, and Fibrin/Exudate after achieving pain control using Lidocaine 4% Topical Solution. A time out was conducted at 15:55, prior to the start of the procedure. A Moderate amount of bleeding was controlled with Pressure. The procedure was tolerated well with a pain level of 0 throughout and a pain level of 0 following the procedure. Post Debridement Measurements: 1.1cm length x 1.1cm width x 0.3cm depth; 0.285cm^3 volume. Character of Wound/Ulcer Post Debridement is improved. Severity of Tissue Post Debridement is: Fat layer exposed. Post procedure Diagnosis Wound #4: Same as Pre-Procedure Pre-procedure diagnosis of Wound #4 is a Venous Leg Ulcer located on the Left,Lateral Malleolus. A skin graft procedure using a bioengineered skin substitute/cellular or tissue based product was performed by Ricard Dillon., MD with the following instrument(s): Forceps and Scissors. Oasis Burn Matrix was applied and secured with Steri-Strips and adaptic. 5 sq cm of product was utilized and 5 sq cm was wasted due to wound size. Post  Application, no dressing was applied. A Time Out was conducted at 16:00, prior to the start of the procedure. The procedure was tolerated well with a pain level of 0 throughout and a pain level of 0 following the procedure. Post procedure Diagnosis Wound #4: Same as Pre-Procedure . Pre-procedure diagnosis of Wound #4 is a Venous Leg Ulcer located on the Left,Lateral Malleolus . There was a Three Layer Compression Therapy Procedure by Baruch Gouty, RN. Post procedure Diagnosis Wound #4: Same as Pre-Procedure Plan Follow-up Appointments: Return  Appointment in 1 week. Dressing Change Frequency: Wound #4 Left,Lateral Malleolus: Do not change entire dressing for one week. Skin Barriers/Peri-Wound Care: TCA Cream or Ointment - liberal to lower leg Wound Cleansing: Wound #4 Left,Lateral Malleolus: May shower with protection. Primary Wound Dressing: Wound #4 Left,Lateral Malleolus: Skin Substitute Application - oasis burn matrix applied by MD. drawtex applied over oasis. secured with adaptic and steri-strips. leave in place. Other: Secondary Dressing: Wound #4 Left,Lateral Malleolus: Foam - foam donut. Dry Gauze Other: - pad the left lateral and anterior portion of lower leg. also pad healed areas to medial leg. Edema Control: 4 layer compression: Left lower extremity - 4 press - no cotton or kerlix layer Avoid standing for long periods of time Elevate legs to the level of the heart or above for 30 minutes daily and/or when sitting, a frequency of: - throughout the day Exercise regularly 1. Oasis #1 of this second round 2. TCA ointment 3. Continue 4-layer compression Electronic Signature(s) Signed: 07/08/2019 5:50:02 PM By: Linton Ham MD Entered By: Linton Ham on 07/08/2019 17:32:40 -------------------------------------------------------------------------------- SuperBill Details Patient Name: Date of Service: [name], Michael RLES F. 07/08/2019 Medical Record Number:  [id number] Patient Account Number: [id number] Date of Birth/Sex: Treating RN: November 19, 1950 (69 y.o. [name] Primary Care Provider: PA Haig Prophet, NO Other Clinician: Referring Provider: Treating Provider/Extender: Silvano Rusk, Designated Weeks in Treatment: 32 Diagnosis Coding ICD-10 Codes Code Description [phone] Chronic venous hypertension (idiopathic) with ulcer and inflammation of left lower extremity L97.321 Non-pressure chronic ulcer of left ankle limited to breakdown of skin M86.472 Chronic osteomyelitis with draining sinus, left ankle and foot L97.228 Non-pressure chronic ulcer of left calf with other specified severity Facility Procedures CPT4: Code [id number] Q4 pe [id number] I Description: 103 - Dermal substitute tissue/non-human origin w metabolically active elements- Oasis (Burn Matrix)product applied r sq cm (Facility Only) 71 - SKIN SUB GRAFT TRNK/ARM/LEG 1 CD-10 Diagnosis Description L97.321 Non-pressure chronic ulcer of  left ankle limited to breakdown of skin Modifier: Quantity: 10 Physician Procedures : CPT4 Code Description Modifier [id number] - WC PHYS SKIN SUB GRAFT TRNK/ARM/LEG ICD-10 Diagnosis Description L97.321 Non-pressure chronic ulcer of left ankle limited to breakdown of skin Quantity: 1 Electronic Signature(s) Signed: 08/06/2019 11:31:43 AM By: [name] Signed: 10/27/2019 7:28:16 AM By: Linton Ham MD Previous Signature: 07/08/2019 5:50:02 PM Version By: Linton Ham MD Entered By: [name] on 08/06/2019 11:31:42

## 2019-07-09 ENCOUNTER — Ambulatory Visit: Payer: Medicare Other

## 2019-07-15 ENCOUNTER — Other Ambulatory Visit: Payer: Self-pay

## 2019-07-15 ENCOUNTER — Encounter (HOSPITAL_BASED_OUTPATIENT_CLINIC_OR_DEPARTMENT_OTHER): Payer: Medicare Other | Admitting: Internal Medicine

## 2019-07-15 DIAGNOSIS — I87332 Chronic venous hypertension (idiopathic) with ulcer and inflammation of left lower extremity: Secondary | ICD-10-CM | POA: Diagnosis not present

## 2019-07-16 NOTE — Progress Notes (Addendum)
Michael, [name] ([id number]) Visit Report for 07/15/2019 Cellular or Tissue Based Product Details Patient Name: Date of Service: [name] 07/15/2019 12:45 PM Medical Record Number: [id number] Patient Account Number: [id number] Date of Birth/Sex: Treating RN: [birth year] (69 y.o. [name] Primary Care Provider: PA Haig Prophet, NO Other Clinician: Referring Provider: Treating Provider/Extender: Silvano Rusk, Designated Weeks in Treatment: 33 Cellular or Tissue Based Product Type Wound #4 Left,Lateral Malleolus Applied to: Performed By: Physician Ricard Dillon., MD Cellular or Tissue Based Product Type: Oasis Burn Matrix Level of Consciousness (Pre-procedure): Awake and Alert Pre-procedure Verification/Time Out Yes - 13:50 Taken: Location: trunk / arms / legs Wound Size (sq cm): 0.56 Product Size (sq cm): 10 Waste Size (sq cm): 3 Waste Reason: wound size Amount of Product Applied (sq cm): 7 Instrument Used: Forceps, Scissors Lot #: [id number] Order #: 2 Expiration Date: 01/25/2021 Fenestrated: No Reconstituted: Yes Solution Type: normal saline Solution Amount: 70mL Lot #: [id number] Solution Expiration Date: 01/31/2021 Secured: Yes Secured With: Steri-Strips, drawtex and adaptic Dressing Applied: No Procedural Pain: 3 Post Procedural Pain: 3 Response to Treatment: Procedure was tolerated well Level of Consciousness (Post- Awake and Alert procedure): Post Procedure Diagnosis Same as Pre-procedure Electronic Signature(s) Signed: 07/16/2019 1:06:39 PM By: Linton Ham MD Entered By: Linton Ham on 07/15/2019 14:12:06 -------------------------------------------------------------------------------- HPI Details Patient Name: Date of Service: [name], CHA RLES F. 07/15/2019 12:45 PM Medical Record Number: [id number] Patient Account Number: [id number] Date of Birth/Sex: Treating RN: 09-06-1950 (69 y.o. [name] Primary Care Provider: PA  Haig Prophet, NO Other Clinician: Referring Provider: Treating Provider/Extender: Silvano Rusk, Designated Weeks in Treatment: 33 History of Present Illness HPI Description: 08/22/17-He is here for initial evaluation of a right heel wound, right toe wound and left heel fissures. He states that he has chronic dry, flaking skin and frequently has cracked heels. He states the right heel wound developed as such but he developed the wound approximately 3 weeks ago when pulling some skin off; he was unaware of the right toe ulcer. He was treating the right heel with over-the-counter triple antibiotic ointment and peroxide. He presented to urgent care on 3/12 with a 2 x 2 centimeter ulceration to the right heel and was discharged with a 10 day prescription for Bactrim. He does have a history of neuropathy, diet controlled diabetic. His does not know his most recent A1c but will be seeing his PCP next week, there is no evidence of an A1c in Epic. He denies smoking, does have a history of alcohol use, he admits to 5+/- beers weekly not daily. He did follow-up with cardiology on 3/18 with c/o ble edema, they ordered DVT study which he completed on 3/21. He reports that the bilateral DVT study was negative per the ultrasound technician, no report is available at this time. READMISSION 11/20/2018 this is a now 69 year old man who is here for review of wounds on his lower extremity on the left. We note that he was here on a single visit in March he tells [place] that over the last 2019. His major issue is that he dropped a box and hit the outside of his left lateral malleolus about 3 weeks ago. This is left him with a nonhealing area. He has been using Iodosorb ointment he had apparently from the last stay in this clinic. Also of note over the last 6 weeks he has noted an area that is raised and will scab over but then he traumatizes a scab  and then this bleeds and reopens. This is on the left posterior calf. All of  this appears to be on the background of chronic venous insufficiency. He has had DVT studies in the past that were negative in 2019. He is also had arterial studies that showed an ABI on the right of 0.76 and on the left of 1.05. Past medical history; the patient states that he is not a diabetic although I note there is some suggested he was in the note from last year. He has a history of coronary artery disease. He tells me that he has had a history of skin cancer but he is not sure which one but that includes one on the left upper thigh. ABIs done previously 0.76 on the right and 1.05 on the left 6/26; patient readmitted to the clinic last week. He had a traumatic area over the left lateral malleolus as well as a hyper granulated growth on the left posterior calf. He also has severe chronic venous insufficiency. 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 We have been using Iodoflex to the traumatic wound under compression. 7/9; area over the left lateral malleolus continues to require debridement and we are continuing with Iodoflex under compression. He has severe chronic venous insufficiency. 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 This patient has severe bilateral venous insufficiency and venous hypertension with skin changes resulting. He requires bilateral reflux studies Finally he traumatized his right lateral calf while doing yard work about a week ago. The area is small with a flap of skin over the majority of the wound but I am not sure this is going to remain viable 7/16; still not a viable surface on the left lateral malleolus in fact this wound is deeper. We have been using Iodoflex. 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 He has severe bilateral venous insufficiency and I have ordered reflux studies these apparently are booked for next week. Our intake nurse noted some yellowish-green drainage coming from the wound on the left ankle. Patient was insistent on talking about doing carotid ultrasounds and he was hoping to get these done with his reflux studies next week. I spent a few minutes talking to him about this I just could not put together in a logical way what he is concerned about. At the end of this I asked him to see his primary doctor about this he says he does not have a primary doctor advised him to get a primary doctor to go over this with him. It was not really clear where the level of concern was specific to carotid ultrasound 7/23; a better looking surface on the left lateral malleolus although it still requires debridement. He tells me he has an appointment to Mohs surgery center next week. Small area on the right lateral calf. The patient went to have his reflux studies at [facility] I do not think they took his compression wraps off. He did have reflux on the left in the common femoral greater saphenous and saphenofemoral junction and the great saphenous vein in the proximal thigh. 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 Looking at the numbers it would appear that the maximal reflux was in the great saphenous vein in the proximal thigh. As usual I am not sure what would be possible to do here. I will send him to vascular surgery 7/30; the patient still requires debridement in the left lateral malleolus although in general the wound is still deep and and punched out. We are using Sorbact. The area on the right lateral calf is improved. 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 The patient does not have a  vascular surgery appointment nor does he have a skin surgery center appointment. I am really not sure what the issues are here although I know vascular surgery as well behind. 8/6-Patient does not have a vascular appointment nor does he have a skin surgery appointment firmed up at this time although he says he is called them both, impressed upon him that he should see whoever is available for the vascular so the vein studies can be interpreted. 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 He is here for the left lateral malleoli wound for which we are using SOrbact, and he is disappointed the wound is not healing 8/21; patient saw Dr. Doren Custard on 01/21/2019. Dr. Doren Custard was concerned about his arterial flow. Noting that he had trouble feeling the dorsalis pedis and posterior tibial arteries also noteworthy that the Doppler had a barely biphasic posterior tibial signal with a monophasic but fairly brisk dorsalis pedis signal. He is booked for an angiogram next Friday. We have been using 3 layer compression on him. 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 Given Dr. Mee Hives concern it was difficult for me to be comfortable with him going ahead with the cancer extraction and I have suggested canceling this. Finally he has an increase in the erythematous contact dermatitis in the entire wrapped area of the left leg which is probably an allergy to the contact layer of the 3 layer. 8/27; he is going for his angiogram tomorrow by  Dr. Doren Custard. We cancel the Mohs surgery on the tumor on his posterior calf pending the angiogram tomorrow. If this shows adequate blood flow to this area I think we can rebook this. His main wound is on the left lateral malleolus a small refractory punched-out area. We have been using Hydrofera Blue 9/3; his angiogram was done as scheduled. He does indeed have PAD. The common femoral, deep femoral superficial femoral and popliteal arteries will are all widely patent. 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 The overall thought was that he had enough adequate circulation to heal his venous ulcer and continue to use mild elevation and compression. Noted that he had biphasic posterior  tibial signals with a Doppler ABI of 0.91. It was not felt that the stenosis in the proximal tibial artery would be associated with increased blood flow he will follow-up with Dr. Doren Custard in 1 month He now needs to get the skin surgery appointment set up. The area is growing larger and bleeding frequently on the posterior left calf per the patient 02/11/19-Patient is back after 1 week, results of his vascular studies are noted, patient needs to have a skin surgery appointment established apparently he did not return the calls this week. The area on the posterior calf is where the skin surgery needs to be done, the left lateral malleoli are wound appears to be about the same we will using Hydrofera Blue change 9/24; patient finally has a booking to deal with the cancer on his posterior calf I believe with the skin surgery center. Not much change in the wound over the left lateral malleolus. It is been 2 weeks since he was here. I changed him to endoform last time 10/2; the patient has been to see dermatology. Scheduled for surgery on 10/14.  Using endoform to the wound over the lateral malleolus some improvement in the wound condition but not the surface area. He has severe chronic venous insufficiency. He also has some arterial disease he was angiogram by Dr. Doren Custard. He did not think that anything needed to be done from an intervention point of view. 10/8; his surgery is still scheduled for 10/14. It is concerning about whether he is healed or maintained skin integrity and is sutured area given the severity of his chronic venous insufficiency. The area we are following is on the left lateral malleolus. Some improvement in the surface area. No debridement today we have been using endoform 10/15; the patient finally had his surgery for the tumor on his left posterior calf. We did not look at this today. Still a punched out area in the lower left malleolus. Perhaps some mild improvement we have been using endoform We were denied for Grafix and still have not heard back on Oasis 10/22; no real change in this wound. I am able to debride it to a healthy looking surface but the next week there is recurrent debris and no improvement in depth. Punched out over the left lateral malleolus. We have not heard back on Oasis. We were denied for Grafix 11/6; wound is perhaps slightly smaller. Still punched out. He was approved for Oasis we applied Oasis #1. His surgical wound on the back of the left calf was reviewed by dermatology we are not reviewing this 11/13; I think the wound is filled in somewhat. Certainly a much more vibrant looking surface. Oasis #2. I looked at his surgical site on the back of the calf today for the first time. Clean incision small open areas 11/19; measurements not much different today. Still 0.4 cm in depth although post debridement the wound surface looks better. Oasis #3 the site on the back of his calf looks satisfactory. I put him in 4 layer compression last week he seems to tolerate this. He is complaining of some  tenderness around the wound. This was controlled swelling around the wound. He seems to have tolerated this 04/28/2019 on evaluation today patient appears to be doing slightly better based on measurements compared to last week. He is here for Oasis application #4 to the ankle region. Fortunately there is no signs of other wounds open at this point and no signs of infection. 12/3; Oasis #5. We  have a better looking wound surface but not much change in depth at 0.6 cm. 12/11; Oasis #6 again some improvement this week better looking surface. Perhaps some improvement in depth 12/17 Oasis number 7.3 cm in depth 05/26/2019 patient is here for Oasis application #8 in regard to his wound. He is showing evidence of some improvement right now with the Oasis. He did also go for his x-ray today although the results are not yet available in epic for review. 12/30- Patient returns at 1 week for Oasis application #9 with regard to his left lower extremity foot wound, which is showing some evidence of improvement, the x-ray that was done last week did show slight periosteal elevation and therefore he would be in line to get an MRI to confirm these findings 06/10/2019; the x-ray that the patient had done showed cortical irregularity and suggested an MRI. An MRI was ordered when he was here I believe on 12/23 but we still do not have an appointment for this. I did 9 Oasis on this man and really the improvement is quite dramatic however it is still not closed but certainly a lot better looking than say 5 to 6 weeks ago. The patient is fixated on Apligraf. I told him that I will not consider ordering this until the MRI is done. Today we put endoform on the wound 1/15; the MRI that I ordered was finally done. Unfortunately it actually posed a difficult question rather than answering it. It was felt that there was subtle periosteal reaction and minimal focal edema in the lateral malleolus. The major differential would be  reactive findings rather than necessarily representing early active osteomyelitis. Given the original depth of this wound however I do not know how I can exclude the osteomyelitis and the consequences of not treating this may be disastrous. 1/21; the patient arrives in clinic today unfortunately had scissor injuries to his upper left medial and mid calf. The worst one here is the mid calf. His wound actually continues to contract. He is taking Levaquin which I am trying to get into him for a month. He is says he is having 1-2 loose stools a day but this does not appear to be watery diarrhea. He is also strongly requesting another round of Oasis. This is after I managed to talk him out of Apligraf. Frankly the wound is too small for an Apligraf now 1/28; I am continuing Levaquin for another 2 weeks. He appears to be tolerating this well without side effects. Small punched out area on the left lateral malleolus. Debris still present we have been using endoform 2/4; he continues on the Levaquin which he appears to be tolerating well. Still the punched-out area on the left lateral malleolus. The scissor injuries from last week appear to be doing better. 2/11; he continues on Levaquin which she will have for 1 more week. Punched out area on the lateral malleolus. I am giving him a Levaquin for possible osteomyelitis based on his MRI. Oasis #2 this week Electronic Signature(s) Signed: 07/16/2019 1:06:39 PM By: Linton Ham MD Entered By: Linton Ham on 07/15/2019 14:13:46 -------------------------------------------------------------------------------- Physical Exam Details Patient Name: Date of Service: Malcolm Metro RLES F. 07/15/2019 12:45 PM Medical Record Number: [id number] Patient Account Number: [id number] Date of Birth/Sex: Treating RN: 1951-02-14 (70 y.o. [name] Primary Care Provider: PA Haig Prophet, NO Other Clinician: Referring Provider: Treating Provider/Extender: Silvano Rusk, Designated Weeks in Treatment: 33 Constitutional Patient is hypertensive.. Pulse regular and within target  range for patient.Marland Kitchen Respirations regular, non-labored and within target range.. Temperature is normal and within the target range for the patient.Marland Kitchen Appears in no distress. Notes Wound exam; left lateral malleolus. Not much change. Dimensions slightly better. There is no erythema. No purulent drainage. Scissor injury has closed that was on the right upper calf. Electronic Signature(s) Signed: 07/16/2019 1:06:39 PM By: Linton Ham MD Entered By: Linton Ham on 07/15/2019 14:14:42 -------------------------------------------------------------------------------- Physician Orders Details Patient Name: Date of Service: [name], CHA RLES F. 07/15/2019 12:45 PM Medical Record Number: [id number] Patient Account Number: [id number] Date of Birth/Sex: Treating RN: [birth year] (69 y.o. 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 Dressing Change Frequency Wound #4 Left,Lateral Malleolus Do not change entire dressing for one week. Skin Barriers/Peri-Wound Care TCA Cream or Ointment - liberal to lower leg Wound Cleansing Wound #4 Left,Lateral Malleolus May shower with protection. Primary Wound Dressing Wound #4 Left,Lateral Malleolus pplication - oasis burn matrix applied by MD. drawtex  applied over oasis. secured with adaptic and steri-strips. leave in place. Skin Substitute A Secondary Dressing Wound #4 Left,Lateral Malleolus Foam - foam donut. Dry Gauze Other: - pad the left lateral and anterior portion of lower leg. Edema Control 3 Layer Compression System - Left Lower Extremity - 4 press - no cotton or kerlix layer Avoid standing for long periods of time Elevate legs to the level of the heart or above for 30 minutes daily and/or when sitting, a frequency of: - throughout the day Exercise regularly Electronic Signature(s) Signed: 07/15/2019 5:57:11 PM By: [name] Signed: 07/16/2019 1:06:39 PM By: Linton Ham MD Entered By: [name] on 07/15/2019 14:15:45 -------------------------------------------------------------------------------- Problem List Details Patient Name: Date of Service: [name], CHA RLES F. 07/15/2019 12:45 PM Medical Record Number: [id number] Patient Account Number: [id number] Date of Birth/Sex: Treating RN: Nov 02, 1950 (69 y.o. 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, left ankle and foot 06/18/2019 No Yes L97.228 Non-pressure chronic ulcer of left calf with other specified severity 06/24/2019 No Yes Inactive Problems ICD-10 Code Description Active Date Inactive Date C44.799 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 07/15/2019 12:45 PM Medical Record Number: [id number] Patient Account Number: [id number] Date of Birth/Sex: Treating RN: 1951-01-16 (69 y.o. [name] Primary Care Provider: PA Darnelle Spangle Other Clinician: Referring Provider: Treating Provider/Extender: Silvano Rusk, Designated Weeks in Treatment: 33 Subjective  History of Present Illness (HPI) 08/22/17-He is here for initial evaluation of a right heel wound, right toe wound and left heel fissures. He states that he has chronic dry, flaking skin and frequently has cracked heels. He states the right heel wound developed as such but he developed the wound approximately 3 weeks ago when pulling some skin off; he was unaware of the right toe ulcer. He was treating the right heel with over-the-counter triple antibiotic ointment and peroxide. He presented to urgent care on 3/12 with a 2 x 2 centimeter ulceration to the right heel and was discharged with a 10 day prescription for Bactrim. He does have a history of neuropathy, diet controlled diabetic. His does not know his most recent A1c but will be seeing his PCP next week, there is no evidence of an A1c in Epic. He denies smoking, does have a history of alcohol use, he admits to 5+/- beers weekly not daily. He did follow-up with cardiology on 3/18 with c/o ble edema, they ordered DVT study which he completed on 3/21. He reports that the bilateral DVT study was negative per the ultrasound technician, no report is available at this time. READMISSION 11/20/2018 this is a now 69 year old man who is here for review of wounds on his lower extremity on the left. We note that  he was here on a single visit in March he tells [place] that over the last 2019. His major issue is that he dropped a box and hit the outside of his left lateral malleolus about 3 weeks ago. This is left him with a nonhealing area. He has been using Iodosorb ointment he had apparently from the last stay in this clinic. Also of note over the last 6 weeks he has noted an area that is raised and will scab over but then he traumatizes a scab and then this bleeds and reopens. This is on the left posterior calf. All of this appears to be on the background of chronic venous insufficiency. He has had DVT studies in the past that were negative in 2019. He is also had arterial studies that showed an ABI on the right of 0.76 and on the left of 1.05. Past medical history; the patient states that he is not a diabetic although I note there is some suggested he was in the note from last year. He has a history of coronary artery disease. He tells me that he has had a history of skin cancer but he is not sure which one but that includes one on the left upper thigh. ABIs done previously 0.76 on the right and 1.05 on the left 6/26; patient readmitted to the clinic last week. He had a traumatic area over the left lateral malleolus as well as a hyper granulated growth on the left posterior calf. He also has severe chronic venous insufficiency. 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 We have been using Iodoflex to the traumatic wound under compression. 7/9; area over the left lateral malleolus continues to require debridement and we are continuing with Iodoflex under compression. He has severe chronic venous insufficiency. 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 This patient has severe bilateral venous insufficiency and venous hypertension with skin  changes resulting. He requires bilateral reflux studies Finally he traumatized his right lateral  calf while doing yard work about a week ago. The area is small with a flap of skin over the majority of the wound but I am not sure this is going to remain viable 7/16; still not a viable surface on the left lateral malleolus in fact this wound is deeper. We have been using Iodoflex. 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 He has severe bilateral venous insufficiency and I have ordered reflux studies these apparently are booked for next week. Our intake nurse noted some yellowish-green drainage coming from the wound on the left ankle. Patient was insistent on talking about doing carotid ultrasounds and he was hoping to get these done with his reflux studies next week. I spent a few minutes talking to him about this I just could not put together in a logical way what he is concerned about. At the end of this I asked him to see his primary doctor about this he says he does not have a primary doctor advised him to get a primary doctor to go over this with him. It was not really clear where the level of concern was specific to carotid ultrasound 7/23; a better looking surface on the left lateral malleolus although it still requires debridement. He tells me he has an appointment to Mohs surgery center next week. Small area on the right lateral calf. The patient went to have his reflux [facility] I do not think they took his compression wraps off. He did have reflux on the left in the common femoral greater saphenous and saphenofemoral junction and the great saphenous vein in the proximal thigh. 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 Looking at  the numbers it would appear that the maximal reflux was in the great saphenous vein in the proximal thigh. As usual I am not sure what would be possible to do here. I will send him to vascular surgery 7/30; the patient still requires debridement in the left lateral malleolus although in general the wound is still deep and and punched out. We are using Sorbact. The area on the right lateral calf is improved. 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 The patient does not have a vascular surgery appointment nor does he have a skin surgery center appointment. I am really not sure what the issues are here although I know vascular surgery as well behind. 8/6-Patient does not have a vascular appointment nor does he have a skin surgery appointment firmed up at this time although he says he is called them both, impressed upon him that he should see whoever is available for the vascular so the vein studies can be interpreted. 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 He is here for the left lateral malleoli wound for which  we are using SOrbact, and he is disappointed the wound is not healing 8/21; patient saw Dr. Doren Custard on 01/21/2019. Dr. Doren Custard was concerned about his arterial flow. Noting that he had trouble feeling the dorsalis pedis and posterior tibial arteries also noteworthy that the Doppler had a barely biphasic posterior tibial signal with a monophasic but fairly brisk dorsalis pedis signal. He is booked for an angiogram next Friday. We have been using 3  layer compression on him. 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 Given Dr. Mee Hives concern it was difficult for me to be comfortable with him going ahead with the cancer extraction and I have suggested canceling this. Finally he has an increase in the erythematous contact dermatitis in the entire wrapped area of the left leg which is probably an allergy to the contact layer of the 3 layer. 8/27; he is going for his angiogram tomorrow by Dr. Doren Custard. We cancel the Mohs surgery on the tumor on his posterior calf pending the angiogram tomorrow. If this shows adequate blood flow to this area I think we can rebook this. His main wound is on the left lateral malleolus a small refractory punched-out area. We have been using Hydrofera Blue 9/3; his angiogram was done as scheduled. He does indeed have PAD. The common femoral, deep femoral superficial femoral and popliteal arteries will are all widely patent. 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 The overall thought was that he had enough adequate circulation to heal his venous ulcer and continue to use mild elevation and compression. Noted that he had biphasic posterior tibial signals with a Doppler ABI of 0.91. It was not felt that the stenosis in the proximal tibial artery would be associated with increased blood flow he will follow-up with Dr. Doren Custard in 1 month He now needs to get the skin surgery appointment set up. The area is growing larger and bleeding frequently on the posterior left calf per the patient 02/11/19-Patient is back after 1 week, results of his vascular studies are noted, patient needs to have a skin surgery appointment established apparently he did not return the calls this week. The  area on the posterior calf is where the skin surgery needs to be done, the left lateral malleoli are wound appears to be about the same we will using Hydrofera Blue change 9/24; patient finally has a booking to deal with the cancer on his posterior calf I believe with the skin surgery center. Not much change in the wound over the left lateral malleolus. It is been 2 weeks since he was here. I changed him to endoform last time 10/2; the patient has been to see dermatology. Scheduled for surgery on 10/14. Using endoform to the wound over the lateral malleolus some improvement in the wound condition but not the surface area. He has severe chronic venous insufficiency. He also has some arterial disease he was angiogram by Dr. Doren Custard. He did not think that anything needed to be done from an intervention point of view. 10/8; his surgery is still scheduled for 10/14. It is concerning about whether he is healed or maintained skin integrity and is sutured area given the severity of his chronic venous insufficiency. The area we are following is on the  left lateral malleolus. Some improvement in the surface area. No debridement today we have been using endoform 10/15; the patient finally had his surgery for the tumor on his left posterior calf. We did not look at this today. Still a punched out area in the lower left malleolus. Perhaps some mild improvement we have been using endoform We were denied for Grafix and still have not heard back on Oasis 10/22; no real change in this wound. I am able to debride it to a healthy looking surface but the next week there is recurrent debris and no improvement in depth. Punched out over the left lateral malleolus. We have not heard back on Oasis. We were denied for Grafix 11/6; wound is perhaps slightly smaller. Still punched out. He was approved for Oasis we applied Oasis #1. His surgical wound on the back of the left calf was reviewed by dermatology we are not reviewing  this 11/13; I think the wound is filled in somewhat. Certainly a much more vibrant looking surface. Oasis #2. I looked at his surgical site on the back of the calf today for the first time. Clean incision small open areas 11/19; measurements not much different today. Still 0.4 cm in depth although post debridement the wound surface looks better. Oasis #3 the site on the back of his calf looks satisfactory. I put him in 4 layer compression last week he seems to tolerate this. He is complaining of some tenderness around the wound. This was controlled swelling around the wound. He seems to have tolerated this 04/28/2019 on evaluation today patient appears to be doing slightly better based on measurements compared to last week. He is here for Oasis application #4 to the ankle region. Fortunately there is no signs of other wounds open at this point and no signs of infection. 12/3; Oasis #5. We have a better looking wound surface but not much change in depth at 0.6 cm. 12/11; Oasis #6 again some improvement this week better looking surface. Perhaps some improvement in depth 12/17 Oasis number 7.3 cm in depth 05/26/2019 patient is here for Oasis application #8 in regard to his wound. He is showing evidence of some improvement right now with the Oasis. He did also go for his x-ray today although the results are not yet available in epic for review. 12/30- Patient returns at 1 week for Oasis application #9 with regard to his left lower extremity foot wound, which is showing some evidence of improvement, the x-ray that was done last week did show slight periosteal elevation and therefore he would be in line to get an MRI to confirm these findings 06/10/2019; the x-ray that the patient had done showed cortical irregularity and suggested an MRI. An MRI was ordered when he was here I believe on 12/23 but we still do not have an appointment for this. I did 9 Oasis on this man and really the improvement is quite  dramatic however it is still not closed but certainly a lot better looking than say 5 to 6 weeks ago. The patient is fixated on Apligraf. I told him that I will not consider ordering this until the MRI is done. Today we put endoform on the wound 1/15; the MRI that I ordered was finally done. Unfortunately it actually posed a difficult question rather than answering it. It was felt that there was subtle periosteal reaction and minimal focal edema in the lateral malleolus. The major differential would be reactive findings rather than necessarily representing early active  osteomyelitis. Given the original depth of this wound however I do not know how I can exclude the osteomyelitis and the consequences of not treating this may be disastrous. 1/21; the patient arrives in clinic today unfortunately had scissor injuries to his upper left medial and mid calf. The worst one here is the mid calf. His wound actually continues to contract. He is taking Levaquin which I am trying to get into him for a month. He is says he is having 1-2 loose stools a day but this does not appear to be watery diarrhea. He is also strongly requesting another round of Oasis. This is after I managed to talk him out of Apligraf. Frankly the wound is too small for an Apligraf now 1/28; I am continuing Levaquin for another 2 weeks. He appears to be tolerating this well without side effects. Small punched out area on the left lateral malleolus. Debris still present we have been using endoform 2/4; he continues on the Levaquin which he appears to be tolerating well. Still the punched-out area on the left lateral malleolus. ooThe scissor injuries from last week appear to be doing better. 2/11; he continues on Levaquin which she will have for 1 more week. Punched out area on the lateral malleolus. I am giving him a Levaquin for possible osteomyelitis based on his MRI. Oasis #2 this week Objective Constitutional Patient is  hypertensive.. Pulse regular and within target range for patient.Marland Kitchen Respirations regular, non-labored and within target range.. Temperature is normal and within the target range for the patient.Marland Kitchen Appears in no distress. Vitals Time Taken: 1:26 PM, Height: 73 in, Weight: 205 lbs, BMI: 27, Temperature: 98.1 F, Pulse: 79 bpm, Respiratory Rate: 18 breaths/min, Blood Pressure: 166/85 mmHg. General Notes: Wound exam; left lateral malleolus. Not much change. Dimensions slightly better. There is no erythema. No purulent drainage. Scissor injury has closed that was on the right upper calf. Integumentary (Hair, Skin) Wound #4 status is Open. Original cause of wound was Trauma. The wound is located on the Left,Lateral Malleolus. The wound measures 0.7cm length x 0.8cm width x 0.6cm depth; 0.44cm^2 area and 0.264cm^3 volume. There is Fat Layer (Subcutaneous Tissue) Exposed exposed. There is no tunneling or undermining noted. There is a medium amount of serosanguineous drainage noted. The wound margin is well defined and not attached to the wound base. There is large (67- 100%) red granulation within the wound bed. There is a small (1-33%) amount of necrotic tissue within the wound bed including Adherent Slough. General Notes: maceration periwound. 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 A skin graft procedure using a bioengineered skin substitute/cellular or tissue based product was performed by Ricard Dillon., MD with the following instrument(s): Forceps and Scissors. Oasis Burn Matrix was applied  and secured with Steri-Strips and drawtex and adaptic. 7 sq cm of product was utilized and 3 sq cm was wasted due to wound size. Post Application, no dressing was applied. A Time Out was conducted at 13:50, prior to the start of the procedure. The procedure was tolerated well with a pain level of 3 throughout and a pain level of 3 following the procedure. Post procedure Diagnosis Wound #  4: Same as Pre-Procedure . Pre-procedure diagnosis of Wound #4 is a Venous Leg Ulcer located on the Left,Lateral Malleolus . There was a Four Layer Compression Therapy Procedure with a pre-treatment ABI of 1 by Baruch Gouty, RN. Post procedure Diagnosis Wound #4: Same as Pre-Procedure Plan Follow-up Appointments: Return Appointment in 1 week. Dressing Change Frequency: Wound #4 Left,Lateral Malleolus: Do not change entire dressing for one week. Skin Barriers/Peri-Wound Care: TCA Cream or Ointment - liberal to lower leg Wound Cleansing: Wound #4 Left,Lateral Malleolus: May shower with protection. Primary Wound Dressing: Wound #4 Left,Lateral Malleolus: Skin Substitute Application - oasis burn matrix applied by MD. drawtex applied over oasis. secured with adaptic and steri-strips. leave in place. Secondary Dressing: Wound #4 Left,Lateral Malleolus: Foam - foam donut. Dry Gauze Other: - pad the left lateral and anterior portion of lower leg. Edema Control: 4 layer compression: Left lower extremity - 4 press - no cotton or kerlix layer Avoid standing for long periods of time Elevate legs to the level of the heart or above for 30 minutes daily and/or when sitting, a frequency of: - throughout the day Exercise regularly 1. Oasis applied to the wound in the standard fashion #2 2. The patient had a multitude of concerns including instability of his ankle, pain pain in his knee. He wanted the wound cultured even though there was not a lot of evidence that there was a culture present not to mention he is on  antibiotics. We went through all of this. 3. He is on 4 layer compression his edema control is good I can reduce him to 3 layer compression to see if this makes things feel better 4. I did not think there was any indication for culture. Electronic Signature(s) Signed: 07/16/2019 1:06:39 PM By: Linton Ham MD Entered By: Linton Ham on 07/15/2019 14:17:11 -------------------------------------------------------------------------------- SuperBill Details Patient Name: Date of Service: [name], CHA RLES F. 07/15/2019 Medical Record Number: [id number] Patient Account Number: [id number] Date of Birth/Sex: Treating RN: 07-May-1951 (69 y.o. [name] Primary Care Provider: PA Haig Prophet, NO Other Clinician: Referring Provider: Treating Provider/Extender: Silvano Rusk, Designated Weeks in Treatment: 33 Diagnosis Coding ICD-10 Codes Code Description [phone] Chronic venous hypertension (idiopathic) with ulcer and inflammation of left lower extremity L97.321 Non-pressure chronic ulcer of left ankle limited to breakdown of skin M86.472 Chronic osteomyelitis with draining sinus, left ankle and foot L97.228 Non-pressure chronic ulcer of left calf with other specified severity Facility Procedures CPT4: Code [id number] Q p Description: 4103 - Dermal substitute tissue/non-human origin w metabolically active elements- Oasis (Burn Matrix)product applied er sq cm (Facility Only) Modifier: Quantity: 10 CPT4: [id number] Description: 271 - SKIN SUB GRAFT TRNK/ARM/LEG ICD-10 Diagnosis Description L97.321 Non-pressure chronic ulcer of left ankle limited to breakdown of skin Modifier: Quantity: 1 Physician Procedures : CPT4 Code Description Modifier [id number] - WC PHYS SKIN SUB GRAFT TRNK/ARM/LEG ICD-10 Diagnosis Description L97.321 Non-pressure chronic ulcer of left ankle limited to breakdown of skin Quantity: 1 Electronic Signature(s) Signed: 08/06/2019 11:32:07 AM By: [name] Signed: 10/27/2019 7:28:16 AM By: Linton Ham MD Previous Signature: 07/16/2019 1:06:39 PM Version By: Linton Ham MD Entered By: [name] on 08/06/2019 11:32:07

## 2019-07-20 ENCOUNTER — Telehealth: Payer: Self-pay | Admitting: Orthopedic Surgery

## 2019-07-20 NOTE — Telephone Encounter (Signed)
Patient called.   He was wanting to speak with his providers but would not disclose why to me.   Call back number: [phone]

## 2019-07-21 NOTE — Telephone Encounter (Signed)
Patient currently under treatment at the wound care center for a right ankle medical ankle ulcer and states this has been going on for 8 months and he is looking for a second opinion for treatment options. Appt made for 07/28/18 at 8:30

## 2019-07-22 ENCOUNTER — Encounter (HOSPITAL_BASED_OUTPATIENT_CLINIC_OR_DEPARTMENT_OTHER): Payer: Medicare Other | Admitting: Internal Medicine

## 2019-07-23 ENCOUNTER — Encounter (HOSPITAL_BASED_OUTPATIENT_CLINIC_OR_DEPARTMENT_OTHER): Payer: Medicare Other | Admitting: Internal Medicine

## 2019-07-23 ENCOUNTER — Other Ambulatory Visit: Payer: Self-pay

## 2019-07-23 DIAGNOSIS — I87332 Chronic venous hypertension (idiopathic) with ulcer and inflammation of left lower extremity: Secondary | ICD-10-CM | POA: Diagnosis not present

## 2019-07-26 NOTE — Progress Notes (Addendum)
[name], [name] ([id number]) Visit Report for 07/23/2019 Cellular or Tissue Based Product Details Patient Name: Date of Service: [name], [name] 07/23/2019 3:15 PM Medical Record [id number] Patient Account Number: [id number] Date of Birth/Sex: Treating RN: November 23, 1950 (69 y.o. M) Primary Care Provider: PATIENT, NO Other Clinician: Referring Provider: Treating Provider/Extender:Kamren Heskett, [name], Designated Weeks in Treatment: 35 Cellular or Tissue Based Wound #4 Left,Lateral Malleolus Product Type Applied to: Performed By: Physician Ricard Dillon., MD Cellular or Tissue Based Oasis Burn Matrix Product Type: Level of Consciousness (Pre- Awake and Alert procedure): Pre-procedure Yes - 16:56 Verification/Time Out Taken: Location: trunk / arms / legs Wound Size (sq cm): 1.92 Product Size (sq cm): 10 Waste Size (sq cm): 2 Waste Reason: wound size Amount of Product Applied (sq cm): 8 Instrument Used: Forceps, Scissors Lot #: [id number] Order #: 3 Expiration Date: 01/25/2021 Fenestrated: No Reconstituted: Yes Solution Type: normal saline Solution Amount: 2mL Lot #: [id number] Solution Expiration Date: 01/31/2021 Secured: Yes Secured With: Steri-Strips Dressing Applied: Yes Primary Dressing: adaptic Procedural Pain: 0 Post Procedural Pain: 0 Response to Treatment: Procedure was tolerated well Level of Consciousness Awake and Alert (Post-procedure): Post Procedure Diagnosis Same as Pre-procedure Electronic Signature(s) Signed: 07/23/2019 6:29:59 PM By: Linton Ham MD Entered By: Linton Ham on 07/23/2019 18:08:49 -------------------------------------------------------------------------------- Debridement Details Patient Name: Date of Service: [name] 07/23/2019 3:15 PM Medical Record [id number] Patient Account Number: [id number] Date of Birth/Sex: Treating RN: 1950-12-09 (69 y.o. M) Primary Care Provider: PATIENT, NO Other  Clinician: Referring Provider: Treating Provider/Extender:Salome Cozby, [name], Designated Weeks in Treatment: 35 Debridement Performed for Wound #4 Left,Lateral Malleolus Assessment: Performed By: Physician Ricard Dillon., MD Debridement Type: Debridement Severity of Tissue Pre Fat layer exposed Debridement: Level of Consciousness (Pre- Awake and Alert procedure): Pre-procedure Yes - 16:55 Verification/Time Out Taken: Start Time: 16:55 Pain Control: Other : Benzocaine 20% Total Area Debrided (L x W): 1.6 (cm) x 1.2 (cm) = 1.92 (cm) Tissue and other material Viable, Non-Viable, Subcutaneous debrided: Level: Skin/Subcutaneous Tissue Debridement Description: Excisional Instrument: Curette Bleeding: Minimum Hemostasis Achieved: Pressure End Time: 16:56 Procedural Pain: 0 Post Procedural Pain: 0 Response to Treatment: Procedure was tolerated well Level of Consciousness Awake and Alert (Post-procedure): Post Debridement Measurements of Total Wound Length: (cm) 1.6 Width: (cm) 1.2 Depth: (cm) 0.2 Volume: (cm) 0.302 Character of Wound/Ulcer Post Improved Debridement: Severity of Tissue Post Debridement: Fat layer exposed Post Procedure Diagnosis Same as Pre-procedure Electronic Signature(s) Signed: 07/23/2019 6:29:59 PM By: Linton Ham MD Entered By: Linton Ham on 07/23/2019 18:08:40 -------------------------------------------------------------------------------- HPI Details Patient Name: Date of Service: [name] 07/23/2019 3:15 PM Medical Record [id number] Patient Account Number: [id number] Date of Birth/Sex: Treating RN: 04-19-1951 (69 y.o. M) Primary Care Provider: PATIENT, NO Other Clinician: Referring Provider: Treating Provider/Extender:Novi Calia, [name], Designated Weeks in Treatment: 35 History of Present Illness HPI Description: 08/22/17-He is here for initial evaluation of a right heel wound, right toe wound and left  heel fissures. He states that he has chronic dry, flaking skin and frequently has cracked heels. He states the right heel wound developed as such but he developed the wound approximately 3 weeks ago when pulling some skin off; he was unaware of the right toe ulcer. He was treating the right heel with over-the-counter triple antibiotic ointment and peroxide. He presented to urgent care on 3/12 with a 2 x 2 centimeter ulceration to the right heel and was discharged with a 10 day prescription for Bactrim. He does have a history of neuropathy, diet  controlled diabetic. His does not know his most recent A1c but will be seeing his PCP next week, there is no evidence of an A1c in Epic. He denies smoking, does have a history of alcohol use, he admits to 5+/- beers weekly not daily. He did follow-up with cardiology on 3/18 with c/o ble edema, they ordered DVT study which he completed on 3/21. He reports that the bilateral DVT study was negative per the ultrasound technician, no report is available at this time. READMISSION 11/20/2018 this is a now 69 year old man who is here for review of wounds on his lower extremity on the left. We note that he was here on a single visit in March he tells [place] that over the last 2019. His major issue is that he dropped a box and hit the outside of his left lateral malleolus about 3 weeks ago. This is left him with a nonhealing area. He has been using Iodosorb ointment he had apparently from the last stay in this clinic. Also of note over the last 6 weeks he has noted an area that is raised and will scab over but then he traumatizes a scab and then this bleeds and reopens. This is on the left posterior calf. All of this appears to be on the background of chronic venous insufficiency. He has had DVT studies in the past that were negative in 2019. He is also had arterial studies that showed an ABI on the right of 0.76 and on the left of 1.05. Past medical history; the  patient states that he is not a diabetic although I note there is some suggested he was in the note from last year. He has a history of coronary artery disease. He tells me that he has had a history of skin cancer but he is not sure which one but that includes one on the left upper thigh. ABIs done previously 0.76 on the right and 1.05 on the left 6/26; patient readmitted to the clinic last week. He had a traumatic area over the left lateral malleolus as well as a hyper granulated growth on the left posterior calf. He also has severe chronic venous insufficiency. 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 We have been using Iodoflex to the traumatic wound under compression. 7/9; area over the left lateral malleolus continues to require debridement and we are continuing with Iodoflex under compression. He has severe chronic venous insufficiency. 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 This patient has severe bilateral venous insufficiency and venous hypertension with skin changes resulting. He requires bilateral reflux studies Finally he traumatized his right lateral calf while doing yard work about a week ago. The area is small with a flap of skin over the majority of the wound but I am not sure this is going to remain viable 7/16; still not a viable surface on the left lateral malleolus in fact this wound is deeper. We have been using Iodoflex. 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 He has severe bilateral venous insufficiency and I have ordered reflux studies these apparently are booked for next week. Our intake nurse noted some yellowish-green drainage coming from the wound  on the left ankle. Patient  was insistent on talking about doing carotid ultrasounds and he was hoping to get these done with his reflux studies next week. I spent a few minutes talking to him about this I just could not put together in a logical way what he is concerned about. At the end of this I asked him to see his primary doctor about this he says he does not have a primary doctor advised him to get a primary doctor to go over this with him. It was not really clear where the level of concern was specific to carotid ultrasound 7/23; a better looking surface on the left lateral malleolus although it still requires debridement. He tells me he has an appointment to Mohs surgery center next week. Small area on the right lateral calf. The patient went to have his reflux studies at [facility] I do not think they took his compression wraps off. He did have reflux on the left in the common femoral greater saphenous and saphenofemoral junction and the great saphenous vein in the proximal thigh. 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 Looking at the numbers it would appear that the maximal reflux was in the great saphenous vein in the proximal thigh. As usual I am not sure what would be possible to do here. I will send him to vascular surgery 7/30; the patient still requires debridement in the left lateral malleolus although in general the wound is still deep and and punched out. We are using Sorbact. The area on the right lateral calf is improved. 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 The patient does not have a vascular surgery appointment nor does he have a skin surgery center appointment. I am really not sure what the issues are here although I know vascular surgery as well behind. 8/6-Patient does not have a vascular appointment nor does he have a skin surgery  appointment firmed up at this time although he says he is called them both, impressed upon him that he should see whoever is available for the vascular so the vein studies can be interpreted. 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 He is here for the left lateral malleoli wound for which we are using SOrbact, and he is disappointed the wound is not healing 8/21; patient saw Dr. Doren Custard on 01/21/2019. Dr. Doren Custard was concerned about his arterial flow. Noting that he had trouble feeling the dorsalis pedis and posterior tibial arteries also noteworthy that the Doppler had a barely biphasic posterior tibial signal with a monophasic but fairly brisk dorsalis pedis signal. He is booked for an angiogram next Friday. We have been using 3 layer compression on him. 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 Given Dr. Mee Hives concern it was difficult for me to be comfortable with him going ahead with the cancer  extraction and I have suggested canceling this. Finally he has an increase in the erythematous contact dermatitis in the entire wrapped area of the left leg which is probably an allergy to the contact layer of the 3 layer. 8/27; he is going for his angiogram tomorrow by Dr. Doren Custard. We cancel the Mohs surgery on the tumor on his posterior calf pending the angiogram tomorrow. If this shows adequate blood flow to this area I think we can rebook this. His main wound is on the left lateral malleolus a small refractory  punched-out area. We have been using Hydrofera Blue 9/3; his angiogram was done as scheduled. He does indeed have PAD. The common femoral, deep femoral superficial femoral and popliteal arteries will are all widely patent. 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 The overall thought was that he had enough adequate circulation to heal his venous ulcer and continue to use mild elevation and compression. Noted that he had biphasic posterior tibial signals with a Doppler ABI of 0.91. It was not felt that the stenosis in the proximal tibial artery would be associated with increased blood flow he will follow-up with Dr. Doren Custard in 1 month He now needs to get the skin surgery appointment set up. The area is growing larger and bleeding frequently on the posterior left calf per the patient 02/11/19-Patient is back after 1 week, results of his vascular studies are noted, patient needs to have a skin surgery appointment established apparently he did not return the calls this week. The area on the posterior calf is where the skin surgery needs to be done, the left lateral malleoli are wound appears to be about the same we will using Hydrofera Blue change 9/24; patient finally has a booking to deal with the cancer on his posterior calf I believe with the skin surgery center. Not much change in the wound over the left lateral malleolus. It is been 2 weeks since he was here. I changed him to endoform last time 10/2; the patient has been to see dermatology. Scheduled for surgery on 10/14. Using endoform to the wound over the lateral malleolus some improvement in the wound condition but not the surface area. He has severe chronic venous insufficiency. He also has some arterial disease he was angiogram by Dr. Doren Custard. He did not  think that anything needed to be done from an intervention point of view. 10/8; his surgery is still scheduled for 10/14. It is concerning about whether he is healed or maintained skin integrity and is sutured area given the severity of his chronic venous insufficiency. The area we are following is on the left lateral malleolus. Some improvement in the surface area. No debridement today we have been using endoform 10/15; the patient finally had his surgery for the tumor on his left posterior calf. We did not look at this today. Still a punched out area in the lower left malleolus. Perhaps some mild improvement we have been using endoform We were denied for Grafix and still have not heard back on Oasis 10/22; no real change in this wound. I am able to debride it to a healthy looking surface but the next week there is recurrent debris and no improvement in depth. Punched out over the left lateral malleolus. We have not heard back on Oasis. We  were denied for Grafix 11/6; wound is perhaps slightly smaller. Still punched out. He was approved for Oasis we applied Oasis #1. His surgical wound on the back of the left calf was reviewed by dermatology we are not reviewing this 11/13; I think the wound is filled in somewhat. Certainly a much more vibrant looking surface. Oasis #2. I looked at his surgical site on the back of the calf today for the first time. Clean incision small open areas 11/19; measurements not much different today. Still 0.4 cm in depth although post debridement the wound surface looks better. Oasis #3 the site on the back of his calf looks satisfactory. I put him in 4 layer compression last week he seems to tolerate this. He is complaining of some tenderness around the wound. This was controlled swelling around the wound. He seems to have tolerated this 04/28/2019 on evaluation today patient appears to be doing slightly better based on measurements compared to last week. He is here  for Oasis application #4 to the ankle region. Fortunately there is no signs of other wounds open at this point and no signs of infection. 12/3; Oasis #5. We have a better looking wound surface but not much change in depth at 0.6 cm. 12/11; Oasis #6 again some improvement this week better looking surface. Perhaps some improvement in depth 12/17 Oasis number 7.3 cm in depth 05/26/2019 patient is here for Oasis application #8 in regard to his wound. He is showing evidence of some improvement right now with the Oasis. He did also go for his x-ray today although the results are not yet available in epic for review. 12/30- Patient returns at 1 week for Oasis application #9 with regard to his left lower extremity foot wound, which is showing some evidence of improvement, the x-ray that was done last week did show slight periosteal elevation and therefore he would be in line to get an MRI to confirm these findings 06/10/2019; the x-ray that the patient had done showed cortical irregularity and suggested an MRI. An MRI was ordered when he was here I believe on 12/23 but we still do not have an appointment for this. I did 9 Oasis on this man and really the improvement is quite dramatic however it is still not closed but certainly a lot better looking than say 5 to 6 weeks ago. The patient is fixated on Apligraf. I told him that I will not consider ordering this until the MRI is done. Today we put endoform on the wound 1/15; the MRI that I ordered was finally done. Unfortunately it actually posed a difficult question rather than answering it. It was felt that there was subtle periosteal reaction and minimal focal edema in the lateral malleolus. The major differential would be reactive findings rather than necessarily representing early active osteomyelitis. Given the original depth of this wound however I do not know how I can exclude the osteomyelitis and the consequences of not treating this may be  disastrous. 1/21; the patient arrives in clinic today unfortunately had scissor injuries to his upper left medial and mid calf. The worst one here is the mid calf. His wound actually continues to contract. He is taking Levaquin which I am trying to get into him for a month. He is says he is having 1-2 loose stools a day but this does not appear to be watery diarrhea. He is also strongly requesting another round of Oasis. This is after I managed to talk him out of Apligraf.  Frankly the wound is too small for an Apligraf now 1/28; I am continuing Levaquin for another 2 weeks. He appears to be tolerating this well without side effects. Small punched out area on the left lateral malleolus. Debris still present we have been using endoform 2/4; he continues on the Levaquin which he appears to be tolerating well. Still the punched-out area on the left lateral malleolus. The scissor injuries from last week appear to be doing better. 2/11; he continues on Levaquin which she will have for 1 more week. Punched out area on the lateral malleolus. I am giving him a Levaquin for possible osteomyelitis based on his MRI. Oasis #2 this week 2/19; he says he still has Levaquin for 1 more week which I find out of keeping with what I thought. As usual his wound on the left lateral malleolus is different. He has nice vibrant red surface with some debris. The superficial area however is actually larger. The depth that he had last time seems to have come in. I am really at a loss to explain this. Electronic Signature(s) Signed: 07/23/2019 6:29:59 PM By: Linton Ham MD Entered By: Linton Ham on 07/23/2019 18:09:44 -------------------------------------------------------------------------------- Physical Exam Details Patient Name: Date of Service: QUISHAWN, FUSELIER 07/23/2019 3:15 PM Medical Record [id number] Patient Account Number: [id number] Date of Birth/Sex: Treating RN: [birth year] (69 y.o.  M) Primary Care Provider: PATIENT, NO Other Clinician: Referring Provider: Treating Provider/Extender:Ulonda Klosowski, [name], Designated Weeks in Treatment: 35 Notes Wound exam; left lateral malleolus. The wound is changed configuration now larger in terms of surface area but with much more healthy looking red tissue. Using a #3 curette approximately 40% removal of surface slough. We then reapplied Oasis in the standard fashion Electronic Signature(s) Signed: 07/23/2019 6:29:59 PM By: Linton Ham MD Entered By: Linton Ham on 07/23/2019 18:10:22 -------------------------------------------------------------------------------- Physician Orders Details Patient Name: Date of Service: JENRI, HUNDT 07/23/2019 3:15 PM Medical Record [id number] Patient Account Number: [id number] Date of Birth/Sex: Treating RN: March 22, 1951 (69 y.o. 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 Dressing Change Frequency Wound #4 Left,Lateral Malleolus Do not change entire dressing for one week. Skin Barriers/Peri-Wound Care TCA Cream or Ointment - liberal to lower leg Wound Cleansing Wound #4 Left,Lateral Malleolus May shower with protection. Primary Wound Dressing Wound #4 Left,Lateral Malleolus Skin Substitute Application - oasis burn matrix applied by MD. drawtex applied over oasis. secured with adaptic  and steri-strips. leave in place. #3 Secondary Dressing Wound #4 Left,Lateral Malleolus Foam - foam donut. Dry Gauze Other: - pad the left lateral and anterior portion of lower leg. Edema Control 3 Layer Compression System - Left Lower Extremity - 4 press - no cotton or kerlix layer Avoid standing for long periods of time Elevate legs to the level of the heart or above for 30 minutes daily and/or when sitting, a frequency of: - throughout the day Exercise regularly Electronic Signature(s) Signed: 07/23/2019 6:29:59 PM By: Linton Ham MD Signed: 07/26/2019 1:40:10 PM By: [name] Entered By: [name] on 07/23/2019 16:51:00 -------------------------------------------------------------------------------- Problem List Details  Patient Name: Date of Service: DARLENE, BONE 07/23/2019 3:15 PM Medical Record [id number] Patient Account Number: [id number] Date of Birth/Sex: Treating RN: [birth year] (69 y.o. [name] Primary Care Provider: PATIENT, NO Other Clinician: Referring Provider: Treating Provider/Extender:Lee-Anne Flicker, [name], Designated Weeks in Treatment: 35 Active Problems ICD-10 Evaluated Encounter Code Description Active Date Today Diagnosis I87.332 Chronic venous hypertension (idiopathic) with ulcer 11/20/2018 No Yes and inflammation of left lower extremity L97.321 Non-pressure chronic ulcer of left ankle limited to 11/20/2018 No Yes breakdown of skin M86.472 Chronic osteomyelitis with draining sinus, left ankle 06/18/2019 No Yes and foot L97.228 Non-pressure chronic ulcer of left calf with other 06/24/2019 No Yes specified severity Inactive Problems ICD-10 Code Description Active Date Inactive Date C44.799 Other specified malignant neoplasm of skin of left lower limb, 11/27/2018 11/27/2018 including hip L97.211 Non-pressure chronic ulcer of right calf limited to breakdown of 12/10/2018 12/10/2018 skin Resolved Problems Electronic  Signature(s) Signed: 07/23/2019 6:29:59 PM By: Linton Ham MD Entered By: Linton Ham on 07/23/2019 18:08:18 -------------------------------------------------------------------------------- Progress Note Details Patient Name: Date of Service: [name] 07/23/2019 3:15 PM Medical Record [id number] Patient Account Number: [id number] Date of Birth/Sex: Treating RN: [birth year] (69 y.o. M) Primary Care Provider: PATIENT, NO Other Clinician: Referring Provider: Treating Provider/Extender:Eustace Hur, [name], Designated Weeks in Treatment: 35 Subjective History of Present Illness (HPI) 08/22/17-He is here for initial evaluation of a right heel wound, right toe wound and left heel fissures. He states that he has chronic dry, flaking skin and frequently has cracked heels. He states the right heel wound developed as such but he developed the wound approximately 3 weeks ago when pulling some skin off; he was unaware of the right toe ulcer. He was treating the right heel with over-the-counter triple antibiotic ointment and peroxide. He presented to urgent care on 3/12 with a 2 x 2 centimeter ulceration to the right heel and was discharged with a 10 day prescription for Bactrim. He does have a history of neuropathy, diet controlled diabetic. His does not know his most recent A1c but will be seeing his PCP next week, there is no evidence of an A1c in Epic. He denies smoking, does have a history of alcohol use, he admits to 5+/- beers weekly not daily. He did follow-up with cardiology on 3/18 with c/o ble edema, they ordered DVT study which he completed on 3/21. He reports that the bilateral DVT study was negative per the ultrasound technician, no report is available at this time. READMISSION 11/20/2018 this is a now 69 year old man who is here for review of wounds on his lower extremity on the left. We note that he was here on a single visit in March he tells [place] that over the  last 2019. His major issue is that he dropped a box and hit the outside of his left lateral malleolus about 3 weeks ago. This is left him with a nonhealing area. He has been using Iodosorb ointment he had apparently from the last stay in this clinic. Also of note over the last 6 weeks he has noted an area that is raised and will scab over but then he traumatizes a scab and then this bleeds and reopens. This is on the left posterior calf. All of this appears to be on the background of chronic venous insufficiency. He has had DVT studies in the past that were negative in 2019. He is also had arterial studies that showed an ABI on the right of 0.76 and on the left of  1.05. Past medical history; the patient states that he is not a diabetic although I note there is some suggested he was in the note from last year. He has a history of coronary artery disease. He tells me that he has had a history of skin cancer but he is not sure which one but that includes one on the left upper thigh. ABIs done previously 0.76 on the right and 1.05 on the left 6/26; patient readmitted to the clinic last week. He had a traumatic area over the left lateral malleolus as well as a hyper granulated growth on the left posterior calf. He also has severe chronic venous insufficiency. 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 We have been using Iodoflex to the traumatic wound under compression. 7/9; area over the left lateral malleolus continues to require debridement and we are continuing with Iodoflex under compression. He has severe chronic venous insufficiency. 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 This patient has severe bilateral venous insufficiency and venous hypertension with skin changes resulting. He requires bilateral reflux  studies Finally he traumatized his right lateral calf while doing yard work about a week ago. The area is small with a flap of skin over the majority of the wound but I am not sure this is going to remain viable 7/16; still not a viable surface on the left lateral malleolus in fact this wound is deeper. We have been using Iodoflex. 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 He has severe bilateral venous insufficiency and I have ordered reflux studies these apparently are booked for next week. Our intake nurse noted some yellowish-green drainage coming from the wound on the left ankle. Patient was insistent on talking about doing carotid ultrasounds and he was hoping to get these done with his reflux studies next week. I spent a few minutes talking to him about this I just could not put together in a logical way what he is concerned about. At the end of this I asked him to see his primary doctor about this he says he does not have a primary doctor advised him to get a primary doctor to go over this with him. It was not really clear where the level of concern was specific to carotid ultrasound 7/23; a better looking surface on the left lateral malleolus although it still requires debridement. He tells me he has an appointment to Mohs surgery center next week. Small area on the right lateral calf. The patient went to have his [facility] I do not think they took his compression wraps off. He did have reflux on the left in the common femoral greater saphenous and saphenofemoral junction and the great saphenous vein in the proximal thigh. 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 Looking at the numbers it would appear that the maximal  reflux was in the great saphenous vein in the proximal thigh. As usual I am not sure what would be possible to do here. I will send him to vascular surgery 7/30; the patient  still requires debridement in the left lateral malleolus although in general the wound is still deep and and punched out. We are using Sorbact. The area on the right lateral calf is improved. 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 The patient does not have a vascular surgery appointment nor does he have a skin surgery center appointment. I am really not sure what the issues are here although I know vascular surgery as well behind. 8/6-Patient does not have a vascular appointment nor does he have a skin surgery appointment firmed up at this time although he says he is called them both, impressed upon him that he should see whoever is available for the vascular so the vein studies can be interpreted. 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 He is here for the left lateral malleoli wound for which we are using SOrbact, and he is disappointed the wound is not healing 8/21; patient saw Dr. Doren Custard on 01/21/2019. Dr. Doren Custard was concerned about his arterial flow. Noting that he had trouble feeling the dorsalis pedis and posterior tibial arteries also noteworthy that the Doppler had a barely biphasic posterior tibial signal with a monophasic but fairly brisk dorsalis pedis signal. He is booked for an angiogram next Friday. We have been using 3 layer compression on him. Also he is  supposed to have Mohs surgery on the cancer site on his left posterior calf next Wednesday. Given Dr. Mee Hives concern it was difficult for me to be comfortable with him going ahead with the cancer extraction and I have suggested canceling this. Finally he has an increase in the erythematous contact dermatitis in the entire wrapped area of the left leg which is probably an allergy to the contact layer of the 3 layer. 8/27; he is going for his angiogram tomorrow by Dr. Doren Custard. We cancel the Mohs surgery on the tumor on his posterior calf pending the angiogram tomorrow. If this shows adequate blood flow to this area I think we can rebook this. His main wound is on the left lateral malleolus a small refractory punched-out area. We have been using Hydrofera Blue 9/3; his angiogram was done as scheduled. He does indeed have PAD. The common femoral, deep femoral superficial femoral and popliteal arteries will are all widely patent. 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 The overall thought was that he had enough adequate circulation to heal his venous ulcer and continue to use mild elevation and compression. Noted that he had biphasic posterior tibial signals with a Doppler ABI of 0.91. It was not felt that the stenosis in the proximal tibial artery would be associated with increased blood flow he will follow-up with Dr. Doren Custard in 1 month He now needs to get the skin surgery appointment set up. The area is growing larger and bleeding frequently on the posterior left calf  per the patient 02/11/19-Patient is back after 1 week, results of his vascular studies are noted, patient needs to have a skin surgery appointment established apparently he did not return the calls this week. The area on the posterior calf is where  the skin surgery needs to be done, the left lateral malleoli are wound appears to be about the same we will using Hydrofera Blue change 9/24; patient finally has a booking to deal with the cancer on his posterior calf I believe with the skin surgery center. Not much change in the wound over the left lateral malleolus. It is been 2 weeks since he was here. I changed him to endoform last time 10/2; the patient has been to see dermatology. Scheduled for surgery on 10/14. Using endoform to the wound over the lateral malleolus some improvement in the wound condition but not the surface area. He has severe chronic venous insufficiency. He also has some arterial disease he was angiogram by Dr. Doren Custard. He did not think that anything needed to be done from an intervention point of view. 10/8; his surgery is still scheduled for 10/14. It is concerning about whether he is healed or maintained skin integrity and is sutured area given the severity of his chronic venous insufficiency. The area we are following is on the left lateral malleolus. Some improvement in the surface area. No debridement today we have been using endoform 10/15; the patient finally had his surgery for the tumor on his left posterior calf. We did not look at this today. Still a punched out area in the lower left malleolus. Perhaps some mild improvement we have been using endoform We were denied for Grafix and still have not heard back on Oasis 10/22; no real change in this wound. I am able to debride it to a healthy looking surface but the next week there is recurrent debris and no improvement in depth. Punched out over the left lateral malleolus. We have not heard back on Oasis. We were denied for Grafix 11/6; wound is perhaps slightly smaller. Still punched out. He was approved for Oasis we applied Oasis #1. His surgical wound on the back of the left calf was reviewed by dermatology we are not reviewing this 11/13; I think the wound is  filled in somewhat. Certainly a much more vibrant looking surface. Oasis #2. I looked at his surgical site on the back of the calf today for the first time. Clean incision small open areas 11/19; measurements not much different today. Still 0.4 cm in depth although post debridement the wound surface looks better. Oasis #3 the site on the back of his calf looks satisfactory. I put him in 4 layer compression last week he seems to tolerate this. He is complaining of some tenderness around the wound. This was controlled swelling around the wound. He seems to have tolerated this 04/28/2019 on evaluation today patient appears to be doing slightly better based on measurements compared to last week. He is here for Oasis application #4 to the ankle region. Fortunately there is no signs of other wounds open at this point and no signs of infection. 12/3; Oasis #5. We have a better looking wound surface but not much change in depth at 0.6 cm. 12/11; Oasis #6 again some improvement this week better looking surface. Perhaps some improvement in depth 12/17 Oasis number 7.3 cm in depth 05/26/2019 patient is here for Oasis application #8 in regard to his wound. He is showing evidence of some improvement right now  with the Oasis. He did also go for his x-ray today although the results are not yet available in epic for review. 12/30- Patient returns at 1 week for Oasis application #9 with regard to his left lower extremity foot wound, which is showing some evidence of improvement, the x-ray that was done last week did show slight periosteal elevation and therefore he would be in line to get an MRI to confirm these findings 06/10/2019; the x-ray that the patient had done showed cortical irregularity and suggested an MRI. An MRI was ordered when he was here I believe on 12/23 but we still do not have an appointment for this. I did 9 Oasis on this man and really the improvement is quite dramatic however it is still not  closed but certainly a lot better looking than say 5 to 6 weeks ago. The patient is fixated on Apligraf. I told him that I will not consider ordering this until the MRI is done. Today we put endoform on the wound 1/15; the MRI that I ordered was finally done. Unfortunately it actually posed a difficult question rather than answering it. It was felt that there was subtle periosteal reaction and minimal focal edema in the lateral malleolus. The major differential would be reactive findings rather than necessarily representing early active osteomyelitis. Given the original depth of this wound however I do not know how I can exclude the osteomyelitis and the consequences of not treating this may be disastrous. 1/21; the patient arrives in clinic today unfortunately had scissor injuries to his upper left medial and mid calf. The worst one here is the mid calf. His wound actually continues to contract. He is taking Levaquin which I am trying to get into him for a month. He is says he is having 1-2 loose stools a day but this does not appear to be watery diarrhea. He is also strongly requesting another round of Oasis. This is after I managed to talk him out of Apligraf. Frankly the wound is too small for an Apligraf now 1/28; I am continuing Levaquin for another 2 weeks. He appears to be tolerating this well without side effects. Small punched out area on the left lateral malleolus. Debris still present we have been using endoform 2/4; he continues on the Levaquin which he appears to be tolerating well. Still the punched-out area on the left lateral malleolus. ooThe scissor injuries from last week appear to be doing better. 2/11; he continues on Levaquin which she will have for 1 more week. Punched out area on the lateral malleolus. I am giving him a Levaquin for possible osteomyelitis based on his MRI. Oasis #2 this week 2/19; he says he still has Levaquin for 1 more week which I find out of keeping  with what I thought. As usual his wound on the left lateral malleolus is different. He has nice vibrant red surface with some debris. The superficial area however is actually larger. The depth that he had last time seems to have come in. I am really at a loss to explain this. Objective Constitutional Vitals Time Taken: 4:00 PM, Height: 73 in, Weight: 205 lbs, BMI: 27, Temperature: 97.9 F, Pulse: 93 bpm, Respiratory Rate: 16 breaths/min, Blood Pressure: 170/91 mmHg. Integumentary (Hair, Skin) Wound #4 status is Open. Original cause of wound was Trauma. The wound is located on the Left,Lateral Malleolus. The wound measures 1.6cm length x 1.2cm width x 0.2cm depth; 1.508cm^2 area and 0.302cm^3 volume. There is Fat Layer (Subcutaneous Tissue)  Exposed exposed. There is no tunneling or undermining noted. There is a medium amount of serosanguineous drainage noted. The wound margin is well defined and not attached to the wound base. There is small (1-33%) red granulation within the wound bed. There is a large (67-100%) amount of necrotic tissue within the wound bed including Adherent Slough. 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 There was a Excisional Skin/Subcutaneous Tissue Debridement with a total area of 1.92 sq cm performed by Ricard Dillon., MD. With the following instrument(s): Curette to remove Viable and Non-Viable tissue/material. Material removed includes Subcutaneous Tissue after achieving pain control using Other  (Benzocaine 20%). No specimens were taken. A time out was conducted at 16:55, prior to the start of the procedure. A Minimum amount of bleeding was controlled with Pressure. The procedure was tolerated well with a pain level of 0 throughout and a pain level of 0 following the procedure. Post Debridement Measurements: 1.6cm length x 1.2cm width x 0.2cm depth; 0.302cm^3 volume. Character of Wound/Ulcer Post Debridement is improved. Severity of Tissue Post Debridement is: Fat layer exposed. Post procedure Diagnosis Wound #4: Same as Pre-Procedure Pre-procedure diagnosis of Wound #4 is a Venous Leg Ulcer located on the Left,Lateral Malleolus. A skin graft procedure using a bioengineered skin substitute/cellular or tissue based product was performed by Ricard Dillon., MD with the following instrument(s): Forceps and Scissors. Oasis Burn Matrix was applied and secured with Steri-Strips. 8 sq cm of product was utilized and 2 sq cm was wasted due to wound size. Post Application, adaptic was applied. A Time Out was conducted at 16:56, prior to the start of the procedure. The procedure was tolerated well with a pain level of 0 throughout and a pain level of 0 following the procedure. Post procedure Diagnosis Wound #4: Same as Pre-Procedure . Pre-procedure diagnosis of Wound #4 is a Venous Leg Ulcer located on the Left,Lateral Malleolus . There was a Four Layer Compression Therapy Procedure by [name], RN. Post procedure Diagnosis Wound #4: Same as Pre-Procedure Plan Follow-up Appointments: Return Appointment in 1 week. Dressing Change Frequency: Wound #4 Left,Lateral Malleolus: Do not change entire dressing for one week. Skin Barriers/Peri-Wound Care: TCA Cream or Ointment - liberal to lower leg Wound Cleansing: Wound #4 Left,Lateral Malleolus: May shower with protection. Primary Wound Dressing: Wound #4 Left,Lateral Malleolus: Skin Substitute Application - oasis burn matrix applied by  MD. drawtex applied over oasis. secured with adaptic and steri-strips. leave in place. #3 Secondary Dressing: Wound #4 Left,Lateral Malleolus: Foam - foam donut. Dry Gauze Other: - pad the left lateral and anterior portion of lower leg. Edema Control: 3 Layer Compression System - Left Lower Extremity - 4 press - no cotton or kerlix layer Avoid standing for long periods of time Elevate legs to the level of the heart or above for 30 minutes daily and/or when sitting, a frequency of: - throughout the day Exercise regularly 1. As usual the patient has a lot of questions . I answered them as best I can. This is a difficult area. He has a combination of chronic venous insufficiency and arterial insufficiency  2. He had a very indeterminant MRI report suggesting osteomyelitis is a possibility. I was left with the uncomfortable situation of treating him empirically even though I was not exactly certain that he had underlying osteomyelitis. I explained this to him in some detail 3. Although the wound is changed from last week the surface of it looks healthier and the depth is come in. I therefore felt there was no other decision to be made but to continue the [employer]) Signed: 07/23/2019 6:29:59 PM By: Linton Ham MD Entered By: Linton Ham on 07/23/2019 18:11:49 -------------------------------------------------------------------------------- SuperBill Details Patient Name: Date of Service: [name] 07/23/2019 Medical Record [id number] Patient Account Number: [id number] Date of Birth/Sex: Treating RN: [id number] (69 y.o. M) Primary Care Provider: PATIENT, NO Other Clinician: Referring Provider: Treating Provider/Extender:Cecile Guevara, [name], Designated Weeks in Treatment: 35 Diagnosis Coding ICD-10 Codes Code Description I87.332 Chronic venous hypertension (idiopathic) with ulcer and inflammation of left lower extremity L97.321 Non-pressure  chronic ulcer of left ankle limited to breakdown of skin M86.472 Chronic osteomyelitis with draining sinus, left ankle and foot L97.228 Non-pressure chronic ulcer of left calf with other specified severity Facility Procedures CPT4: Description Modifier Quantity Code [id number] - Dermal substitute tissue/non-human origin w metabolically active 10 elements- Oasis (Burn Matrix)product applied per sq cm (Facility Only) [id number] - SKIN SUB GRAFT TRNK/ARM/LEG 1 ICD-10 Diagnosis Description L97.321 Non-pressure chronic ulcer of left ankle limited to breakdown of skin I87.332 Chronic venous hypertension (idiopathic) with ulcer and inflammation of left lower  extremity Physician Procedures CPT4: Code [id number] Description: 46 - WC PHYS SKIN SUB GRAFT TRNK/ARM/LEG ICD-10 Diagnosis Description L97.321 Non-pressure chronic ulcer of left ankle limited to breakdown I87.332 Chronic venous hypertension (idiopathic) with ulcer and infla extremity Modifier: of skin mmation of lef Quantity: 1 t lower Electronic Signature(s) Signed: 08/06/2019 11:32:32 AM By: [name] Signed: 08/09/2019 5:29:15 PM By: Linton Ham MD Previous Signature: 07/23/2019 6:29:59 PM Version By: Linton Ham MD Entered By: [name] on 08/06/2019 11:32:32

## 2019-07-29 ENCOUNTER — Encounter (HOSPITAL_BASED_OUTPATIENT_CLINIC_OR_DEPARTMENT_OTHER): Payer: Medicare Other | Admitting: Internal Medicine

## 2019-07-29 ENCOUNTER — Ambulatory Visit: Payer: Medicare Other | Admitting: Orthopedic Surgery

## 2019-07-29 ENCOUNTER — Other Ambulatory Visit: Payer: Self-pay

## 2019-07-29 ENCOUNTER — Ambulatory Visit (INDEPENDENT_AMBULATORY_CARE_PROVIDER_SITE_OTHER): Payer: Medicare Other | Admitting: Orthopedic Surgery

## 2019-07-29 ENCOUNTER — Encounter: Payer: Self-pay | Admitting: Orthopedic Surgery

## 2019-07-29 VITALS — Ht 72.0 in | Wt 200.0 lb

## 2019-07-29 DIAGNOSIS — I739 Peripheral vascular disease, unspecified: Secondary | ICD-10-CM | POA: Diagnosis not present

## 2019-07-29 DIAGNOSIS — I87332 Chronic venous hypertension (idiopathic) with ulcer and inflammation of left lower extremity: Secondary | ICD-10-CM | POA: Diagnosis not present

## 2019-07-29 DIAGNOSIS — L97929 Non-pressure chronic ulcer of unspecified part of left lower leg with unspecified severity: Secondary | ICD-10-CM | POA: Diagnosis not present

## 2019-07-29 MED ORDER — NITROGLYCERIN 0.2 MG/HR TD PT24: 0.2000 mg | MEDICATED_PATCH | Freq: Every day | TRANSDERMAL | 12 refills | Status: AC

## 2019-07-29 MED ORDER — PENTOXIFYLLINE ER 400 MG PO TBCR: 400.0000 mg | EXTENDED_RELEASE_TABLET | Freq: Three times a day (TID) | ORAL | 3 refills | Status: DC

## 2019-07-29 NOTE — Progress Notes (Signed)
ZAMARION, LONGEST ([id number]) Visit Report for 07/23/2019 Arrival Information Details Patient Name: Date of Service: [name], [name] 07/23/2019 3:15 PM Medical Record [id number] Patient Account Number: [id number] Date of Birth/Sex: Treating RN: [birth year] (69 y.o. [name] Primary Care Gino Garrabrant: PATIENT, NO Other Clinician: Referring Wessley Emert: Treating Keidy Thurgood/Extender:Robson, [name], Designated Weeks in Treatment: 48 Visit Information History Since Last Visit Added or deleted any medications: No Patient Arrived: [name] Any new allergies or adverse reactions: No Arrival Time: 15:54 Had a fall or experienced change in No Accompanied By: alone activities of daily living that may affect Transfer Assistance: None risk of falls: Patient Identification Verified: Yes Signs or symptoms of abuse/neglect since last No Secondary Verification Process Yes visito Completed: Hospitalized since last visit: No Patient Requires Transmission- No Implantable device outside of the clinic excluding No Based Precautions: cellular tissue based products placed in the center Patient Has Alerts: Yes ABIs 08/2018 L1.05 since last visit: Patient Alerts: Has Dressing in Place as Prescribed: Yes R0.76 Has Compression in Place as Prescribed: Yes Pain Present Now: Yes Electronic Signature(s) Signed: 07/29/2019 8:56:01 AM By: [name] RN, BSN Entered By: [name] on 07/23/2019 16:00:20 -------------------------------------------------------------------------------- Compression Therapy Details Patient Name: Date of Service: [name] 07/23/2019 3:15 PM Medical Record [id number] Patient Account Number: [id number] Date of Birth/Sex: Treating RN: 13-Jul-1950 (69 y.o. [name] Primary Care Jovie Swanner: PATIENT, NO Other Clinician: Referring Mustaf Antonacci: Treating Sury Wentworth/Extender:Robson, [name], Designated Weeks in Treatment: 35 Compression  Therapy Performed for Wound Wound #4 Left,Lateral Malleolus Assessment: Performed By: Clinician [name], RN Compression Type: Four Layer Post Procedure Diagnosis Same as Pre-procedure Electronic Signature(s) Signed: 07/26/2019 1:40:10 PM By: [name] Entered By: [name] on 07/23/2019 17:04:26 -------------------------------------------------------------------------------- Encounter Discharge Information Details Patient Name: Date of Service: [name], [name] 07/23/2019 3:15 PM Medical Record [id number] Patient Account Number: [id number] Date of Birth/Sex: Treating RN: September 11, 1950 (69 y.o. [name] Primary Care Taylee Gunnells: PATIENT, NO Other Clinician: Referring Levis Nazir: Treating Brynn Reznik/Extender:Robson, [name], Designated Weeks in Treatment: 35 Encounter Discharge Information Items Post Procedure Vitals Discharge Condition: Stable Temperature (F): 97.9 Ambulatory Status: Cane Pulse (bpm): 93 Discharge Destination: Home Respiratory Rate (breaths/min): 16 Transportation: Private Auto Blood Pressure (mmHg): 170/91 Accompanied By: self Schedule Follow-up Appointment: Yes Clinical Summary of Care: Electronic Signature(s) Signed: 07/23/2019 6:28:18 PM By: [name] Entered By: [name] on 07/23/2019 18:17:46 -------------------------------------------------------------------------------- Lower Extremity Assessment Details Patient Name: Date of Service: [name], [name] 07/23/2019 3:15 PM Medical Record [id number] Patient Account Number: [id number] Date of Birth/Sex: Treating RN: 07/23/1950 (69 y.o. [name] Primary Care Tully Mcinturff: PATIENT, NO Other Clinician: Referring Katalina Magri: Treating Anysia Choi/Extender:Robson, [name], Designated Weeks in Treatment: 35 Edema Assessment Assessed: [Left: No] [Right: No] Edema: [Left: Ye] [Right: s] Calf Left: Right: Point of Measurement: 35 cm From Medial Instep 31.6  cm cm Ankle Left: Right: Point of Measurement: 9 cm From Medial Instep 22.2 cm cm Vascular Assessment Pulses: Dorsalis Pedis Palpable: [Left:Yes] Electronic Signature(s) Signed: 07/29/2019 8:56:01 AM By: [name] RN, BSN Entered By: [name] on 07/23/2019 16:04:50 -------------------------------------------------------------------------------- Multi Wound Chart Details Patient Name: Date of Service: [name] 07/23/2019 3:15 PM Medical Record [id number] Patient Account Number: [id number] Date of Birth/Sex: Treating RN: Oct 23, 1950 (69 y.o. M) Primary Care Itza Maniaci: PATIENT, NO Other Clinician: Referring Chasiti Waddington: Treating Shakeerah Gradel/Extender:Robson, [name], Designated Weeks in Treatment: 35 Vital Signs Height(in): 73 Pulse(bpm): 93 Weight(lbs): 205 Blood Pressure(mmHg): 170/91 Body Mass Index(BMI): 27 Temperature(F): 97.9 Respiratory 16 Rate(breaths/min): Photos: [4:No Photos] [N/A:N/A] Wound  Location: [4:Left Malleolus - Lateral] [N/A:N/A] Wounding Event: [4:Trauma] [N/A:N/A] Primary Etiology: [4:Venous Leg Ulcer] [N/A:N/A] Comorbid History: [4:Arrhythmia, Coronary Artery N/A Disease, Type II Diabetes] Date Acquired: [4:11/02/2018] [N/A:N/A] Weeks of Treatment: [4:35] [N/A:N/A] Wound Status: [4:Open] [N/A:N/A] Measurements L x W x D 1.6x1.2x0.2 [N/A:N/A] (cm) Area (cm) : [4:1.508] [N/A:N/A] Volume (cm) : [4:0.302] [N/A:N/A] % Reduction in Area: [4:-16.40%] [N/A:N/A] % Reduction in Volume: -132.30% [N/A:N/A] Classification: [4:Full Thickness Without Exposed Support Structures] [N/A:N/A] Exudate Amount: [4:Medium] [N/A:N/A] Exudate Type: [4:Serosanguineous] [N/A:N/A] Exudate Color: [4:red, brown] [N/A:N/A] Wound Margin: [4:Well defined, not attached N/A] Granulation Amount: [4:Small (1-33%)] [N/A:N/A] Granulation Quality: [4:Red] [N/A:N/A] Necrotic Amount: [4:Large (67-100%)] [N/A:N/A] Exposed Structures: [4:Fat Layer (Subcutaneous  N/A Tissue) Exposed: Yes Fascia: No Tendon: No Muscle: No Joint: No Bone: No] Epithelialization: [4:Small (1-33%)] [N/A:N/A] Debridement: [4:Debridement - Excisional N/A] Pre-procedure [4:16:55] [N/A:N/A] Verification/Time Out Taken: Pain Control: [4:Other] [N/A:N/A] Tissue Debrided: [4:Subcutaneous] [N/A:N/A] Level: [4:Skin/Subcutaneous Tissue] [N/A:N/A] Debridement Area (sq cm):1.92 [N/A:N/A] Instrument: [4:Curette] [N/A:N/A] Bleeding: [4:Minimum] [N/A:N/A] Hemostasis Achieved: [4:Pressure] [N/A:N/A] Procedural Pain: [4:0] [N/A:N/A] Post Procedural Pain: [4:0] [N/A:N/A] Debridement Treatment Procedure was tolerated [N/A:N/A] Response: [4:well] Post Debridement [4:1.6x1.2x0.2] [N/A:N/A] Measurements L x W x D (cm) Post Debridement [4:0.302] [N/A:N/A] Volume: (cm) Procedures Performed: Cellular or Tissue Based [4:Product Compression Therapy Debridement] [N/A:N/A] Treatment Notes Electronic Signature(s) Signed: 07/23/2019 6:29:59 PM By: Linton Ham MD Entered By: Linton Ham on 07/23/2019 18:08:31 -------------------------------------------------------------------------------- Multi-Disciplinary Care Plan Details Patient Name: Date of Service: [name]. 07/23/2019 3:15 PM Medical Record [id number] Patient Account Number: [id number] Date of Birth/Sex: Treating RN: Dec 27, 1950 (69 y.o. [name] Primary Care Vitaliy Eisenhour: PATIENT, NO Other Clinician: Referring Kallan Merrick: Treating Jarelis Ehlert/Extender:Robson, [name], Designated Weeks in Treatment: 35 Active Inactive Venous Leg Ulcer Nursing Diagnoses: Knowledge deficit related to disease process and management Potential for venous Insuffiency (use before diagnosis confirmed) Goals: Patient will maintain optimal edema control Date Initiated: 11/20/2018 Target Resolution Date: 09/03/2019 Goal Status: Active Patient/caregiver will verbalize understanding of disease process and disease  management Date Initiated: 11/20/2018 Date Inactivated: 12/17/2018 Target Resolution Date: 12/18/2018 Goal Status: Met Interventions: Assess peripheral edema status every visit. Compression as ordered Provide education on venous insufficiency Treatment Activities: Therapeutic compression applied : 11/20/2018 Notes: Electronic Signature(s) Signed: 07/26/2019 1:40:10 PM By: [name] Entered By: [name] on 07/23/2019 16:04:23 -------------------------------------------------------------------------------- Pain Assessment Details Patient Name: Date of Service: CAMERON, SCHWINN 07/23/2019 3:15 PM Medical Record [id number] Patient Account Number: [id number] Date of Birth/Sex: Treating RN: 1950/06/16 (69 y.o. [name] Primary Care Sesar Madewell: PATIENT, NO Other Clinician: Referring Onika Gudiel: Treating Amalee Olsen/Extender:Robson, [name], Designated Weeks in Treatment: 35 Active Problems Location of Pain Severity and Description of Pain Patient Has Paino Yes Site Locations Pain Location: Pain in Ulcers With Dressing Change: Yes Duration of the Pain. Constant / Intermittento Intermittent Rate the pain. Current Pain Level: 6 Character of Pain Describe the Pain: Throbbing Pain Management and Medication Current Pain Management: Medication: Yes Cold Application: No Rest: No Massage: No Activity: No T.E.N.S.: No Heat Application: No Leg drop or elevation: No Is the Current Pain Management Adequate: Adequate How does your wound impact your activities of daily livingo Sleep: No Bathing: No Appetite: No Relationship With Others: No Bladder Continence: No Emotions: No Bowel Continence: No Work: No Toileting: No Drive: No Dressing: No Hobbies: No Electronic Signature(s) Signed: 07/29/2019 8:56:01 AM By: [name] RN, BSN Entered By: [name] on 07/23/2019  15:57:22 -------------------------------------------------------------------------------- Patient/Caregiver Education Details Patient Name: Date of Service: [name] 2/19/2021andnbsp3:15 PM Medical Record [id number]  Patient Account Number: [id number] Date of Birth/Gender: Treating RN: Jul 20, 1950 (69 y.o. [name] Primary Care Physician: PATIENT, NO Other Clinician: Referring Physician: Treating Physician/Extender:Robson, [name], Designated Weeks in Treatment: 35 Education Assessment Education Provided To: Patient Education Topics Provided Venous: Methods: Explain/Verbal Responses: State content correctly Electronic Signature(s) Signed: 07/26/2019 1:40:10 PM By: [name] Entered By: [name] on 07/23/2019 16:04:37 -------------------------------------------------------------------------------- Wound Assessment Details Patient Name: Date of Service: [name], [name] 07/23/2019 3:15 PM Medical Record [id number] Patient Account Number: [id number] Date of Birth/Sex: Treating RN: Oct 03, 1950 (69 y.o. M) Primary Care Jemery Stacey: PATIENT, NO Other Clinician: Referring Khaliel Morey: Treating Duru Reiger/Extender:Robson, Legrand Como None, Designated Weeks in Treatment: 35 Wound Status Wound Number: 4 Primary Venous Leg Ulcer Etiology: Wound Location: Left Malleolus - Lateral Wound Open Wounding Event: Trauma Status: Date Acquired: 11/02/2018 Comorbid Arrhythmia, Coronary Artery Disease, Weeks Of Treatment: 35 History: Type II Diabetes Clustered Wound: No Photos Wound Measurements Length: (cm) 1.6 % Reduct Width: (cm) 1.2 % Reduct Depth: (cm) 0.2 Epitheli Area: (cm) 1.508 Tunneli Volume: (cm) 0.302 Undermi Wound Description Classification: Full Thickness Without Exposed Support Foul Od Structures Slough/ Wound Well defined, not attached Margin: Exudate Medium Amount: Exudate Exudate Serosanguineous Type: Exudate red,  brown Color: Wound Bed Granulation Amount: Small (1-33%) Granulation Quality: Red Fascia Necrotic Amount: Large (67-100%) Fat La Necrotic Quality: Adherent Slough Tendon Muscle Joint Bone E or After Cleansing: No Fibrino Yes Exposed Structure Exposed: No yer (Subcutaneous Tissue) Exposed: Yes Exposed: No Exposed: No Exposed: No xposed: No ion in Area: -16.4% ion in Volume: -132.3% alization: Small (1-33%) ng: No ning: No Treatment Notes Wound #4 (Left, Lateral Malleolus) 1. Cleanse With Wound Cleanser Soap and water 2. Periwound Care TCA Cream 3. Primary Dressing Applied Cellular Based Tissue Product Other primary dressing (specifiy in notes) 4. Secondary Dressing Dry Gauze Foam Drawtex 6. Support Layer Applied 4 layer compression wrap Notes oasis covered with adaptic and secured with steristrips. foam donut as secondary. use milikan 2 press in place of 4 layer applies same compression mmHg. Electronic Signature(s) Signed: 07/26/2019 4:33:06 PM By: Mikeal Hawthorne EMT/HBOT Entered By: Mikeal Hawthorne on 07/26/2019 14:46:27 -------------------------------------------------------------------------------- Vitals Details Patient Name: Date of Service: [name], [name] 07/23/2019 3:15 PM Medical Record [id number] Patient Account Number: [id number] Date of Birth/Sex: Treating RN: 09/22/1950 (69 y.o. [name] Primary Care Jhordan Kinter: PATIENT, NO Other Clinician: Referring Renatha Rosen: Treating Maleeha Halls/Extender:Robson, [name], Designated Weeks in Treatment: 35 Vital Signs Time Taken: 16:00 Temperature (F): 97.9 Height (in): 73 Pulse (bpm): 93 Weight (lbs): 205 Respiratory Rate (breaths/min): 16 Body Mass Index (BMI): 27 Blood Pressure (mmHg): 170/91 Reference Range: 80 - 120 mg / dl Electronic Signature(s) Signed: 07/29/2019 8:56:01 AM By: [name] RN, BSN Entered By: [name] on 07/23/2019 16:00:44

## 2019-07-29 NOTE — Progress Notes (Signed)
Office Visit Note   Patient: [name]           Date of Birth: 1950-10-10           MRN: [id number] Visit Date: 07/29/2019              Requested by: No referring provider defined for this encounter. PCP: Patient, No Pcp Per  Chief Complaint  Patient presents with  . Left Ankle - Open Wound      HPI: Patient is a 69 year old gentleman who presents with a complex vascular ulcer lateral malleolus left ankle.  Patient has been seen by vascular vein surgery has had arteriogram studies and ankle-brachial indices he has been followed at the wound clinic with Dr. Dellia Nims with good conservative wound care dressings.  Patient is seen for initial evaluation today for the chronic ulcer he states it has been there for over 9 months.  States he is currently on Levaquin for 4 weeks.  No history of diabetes he is not on any immune suppressive medications.  Assessment & Plan: Visit Diagnoses:  1. PVD (peripheral vascular disease) ([place])   2. Chronic venous hypertension (idiopathic) with ulcer and inflammation of left lower extremity (HCC)     Plan: Discussed with the patient multiple treatment options feel the best next treatment option would be to proceed with a Vive low compression stocking 15 to 20 mmHg compression he will wear this around-the-clock change it daily with soap and water dressing changes.  He will apply a 0.2 mg nitroglycerin patch to an area around the wound but not directly over the wound and will start Trental 400 mg 3 times a day he is to call us if there is any acute changes.  Close follow-up in 1 week.  Follow-Up Instructions: Return in about 1 week (around 08/05/2019).   Ortho Exam  Patient is alert, oriented, no adenopathy, well-dressed, normal affect, normal respiratory effort. Examination patient does not have a palpable dorsalis pedis pulse.  Review of his ankle-brachial indices shows decreased arterial circulation to the left lower extremity.  The arteriogram  was reviewed which patient does have inline flow to the posterior tibial artery with a area of 50% stenosis.  The anterior tibial and peroneal's are completely occluded.  Review of the MRI scan shows some mild edema within the fibula this may be reactive but does not have the appearance of osteomyelitis.  Lamination of the wound bed is approximately 2 cm in diameter with maceration around the wound from his compression wrap and dressing.  The wound is approximately 5 mm deep but does not probe to bone.  There is fibrinous exudative tissue within the wound bed.  Patient has brawny skin color changes in both legs with varicose veins and chronic venous insufficiency.  His calf measures 36 cm in circumference.  Imaging: No results found.   Labs: Lab Results  Component Value Date   HGBA1C (H) 09/19/2010    6.4 (NOTE)                                                                       According to the ADA Clinical Practice Recommendations for 2011, when HbA1c is used as a screening test:   >=6.5%  Diagnostic of Diabetes Mellitus           (if abnormal result  is confirmed)  5.7-6.4%   Increased risk of developing Diabetes Mellitus  References:Diagnosis and Classification of Diabetes Mellitus,Diabetes [id number] 1):S62-S69 and Standards of Medical Care in         Diabetes - 2011,Diabetes Care,2011,34  (Suppl 1):S11-S61.   HGBA1C (H) 10/30/2009    7.0 (NOTE)                                                                       According to the ADA Clinical Practice Recommendations for 2011, when HbA1c is used as a screening test:   >=6.5%   Diagnostic of Diabetes Mellitus           (if abnormal result  is confirmed)  5.7-6.4%   Increased risk of developing Diabetes Mellitus  References:Diagnosis and Classification of Diabetes Mellitus,Diabetes [id number] 1):S62-S69 and Standards of Medical Care in         Diabetes - 2011,Diabetes [id number]  (Suppl 1):S11-S61.   HGBA1C  09/29/2007     5.7 (NOTE)   The ADA recommends the following therapeutic goals for glycemic   control related to Hgb A1C measurement:   Goal of Therapy:   < 7.0% Hgb A1C   Action Suggested:  > 8.0% Hgb A1C   Ref:  Diabetes Care, 22, Suppl. 1, 1999   ESRSEDRATE 17 (H) 10/30/2009   CRP 4.3 (H) 10/30/2009   REPTSTATUS 12/19/2018 FINAL 12/17/2018   GRAMSTAIN NO WBC SEEN NO ORGANISMS SEEN  12/17/2018   CULT  12/17/2018    FEW NORMAL SKIN FLORA Performed at [facility], [place] [address] [place], [place] [address]    LABORGA METHICILLIN RESISTANT STAPHYLOCOCCUS AUREUS 10/17/2007     Lab Results  Component Value Date   ALBUMIN 4.4 08/31/2015   ALBUMIN 4.1 01/27/2014   ALBUMIN 4.3 11/01/2013    No results found for: MG No results found for: VD25OH  No results found for: PREALBUMIN CBC EXTENDED Latest Ref Rng & Units 01/29/2019 08/31/2015 11/01/2013  WBC 4.0 - 10.5 K/uL - 5.5 6.9  RBC 4.22 - 5.81 MIL/uL - 4.85 5.10  HGB 13.0 - 17.0 g/dL 15.6 15.3 16.2  HCT 39.0 - 52.0 % 46.0 46.1 47.9  PLT 150 - 400 K/uL - 180 298  NEUTROABS 1.7 - 7.7 K/uL - - 5.0  LYMPHSABS 0.7 - 4.0 K/uL - - 1.0     Body mass index is 27.12 kg/m.  Orders:  No orders of the defined types were placed in this encounter.  No orders of the defined types were placed in this encounter.    Procedures: No procedures performed  Clinical Data: No additional findings.  ROS:  All other systems negative, except as noted in the HPI. Review of Systems  Objective: Vital Signs: Ht 6' (1.829 m)   Wt 200 lb (90.7 kg)   BMI 27.12 kg/m   Specialty Comments:  No specialty comments available.  PMFS History: Patient Active Problem List   Diagnosis Date Noted  . Hx of adenomatous colonic polyps 07/25/2015  . Chest pain 01/05/2014  . Coronary artery disease 02/28/2011  . Glucose intolerance (pre-diabetes) 02/28/2011  . ALCOHOL ABUSE 11/21/2009  .  WOUND, FOOT 02/11/2009  . VENOUS INSUFFICIENCY, CHRONIC, RIGHT LEG  06/07/2008  . HYPERLIPIDEMIA 03/11/2007  . HYPERTENSION 03/11/2007  . LOW BACK PAIN 03/11/2007   Past Medical History:  Diagnosis Date  . Alcohol abuse   . CAD (coronary artery disease)    a. s/p CABG 2009 (Dr. Roxy Manns - LIMA-OM2, L radial - PDA, SVG-Dx) // b. Nuc 8/15: No ischemia, EF 65, normal study // c. Nuc 3/17: No significant reversible ischemia. LVEF 64% with normal wall motion. This is a low risk study.   . Carotid stenosis    Carotid US 3/19: Bilateral ICA 1-39  . DDD (degenerative disc disease)   . Gout   . HLD (hyperlipidemia)   . Hx of adenomatous colonic polyps 07/25/2015  . Hyperglycemia   . Hypertension   . Lumbago    (low back pain)   . Neuropathy   . PAF (paroxysmal atrial fibrillation) ([place])    Anticoag (previously on Xarelto) stopped in 2017 due to concerns over bleeding risk in the setting of heavy alcohol use)  . Paroxysmal atrial flutter ([place])    TEE 6/15: EF 50-55, no RWMA, mild MR, LAE, no LAA clot >> s/p DCCV to restore NSR  . S/P CABG x 1   . Sciatica     Family History  Problem Relation Age of Onset  . Coronary artery disease Unknown        family history  . Heart disease Father        CABG   . Heart attack Father   . Stroke Unknown        family history  . Colon cancer Neg Hx   . Colon polyps Neg Hx   . Esophageal cancer Neg Hx   . Rectal cancer Neg Hx   . Stomach cancer Neg Hx     Past Surgical History:  Procedure Laterality Date  . ABDOMINAL AORTOGRAM W/LOWER EXTREMITY Bilateral 01/29/2019   Procedure: ABDOMINAL AORTOGRAM W/LOWER EXTREMITY;  Surgeon: Angelia Mould, MD;  Location: [facility];  Service: Cardiovascular;  Laterality: Bilateral;  . CARDIAC CATHETERIZATION  4.17.09  . CARDIOVERSION N/A 11/03/2013   Procedure: CARDIOVERSION;  Surgeon: Dorothy Spark, MD;  Location: [facility];  Service: Cardiovascular;  Laterality: N/A;  . COLONOSCOPY  10-01-2002   gessner-tics and hems   . CORONARY ARTERY BYPASS GRAFT  5.8.09     x3  . INGUINAL HERNIA REPAIR Left   . LAPAROSCOPIC INGUINAL HERNIA REPAIR Right   . TEE WITHOUT CARDIOVERSION N/A 11/03/2013   Procedure: TRANSESOPHAGEAL ECHOCARDIOGRAM (TEE);  Surgeon: Dorothy Spark, MD;  Location: [facility];  Service: Cardiovascular;  Laterality: N/A;  . TONSILLECTOMY AND ADENOIDECTOMY    . WRIST SURGERY     Social History   Occupational History  . Not on file  Tobacco Use  . Smoking status: Never Smoker  . Smokeless tobacco: Never Used  Substance and Sexual Activity  . Alcohol use: Yes    Alcohol/week: 0.0 standard drinks    Comment: 6 beers per week  . Drug use: No  . Sexual activity: Not on file

## 2019-08-01 ENCOUNTER — Encounter: Payer: Self-pay | Admitting: Orthopedic Surgery

## 2019-08-03 ENCOUNTER — Other Ambulatory Visit: Payer: Self-pay

## 2019-08-03 ENCOUNTER — Ambulatory Visit (INDEPENDENT_AMBULATORY_CARE_PROVIDER_SITE_OTHER): Payer: Medicare Other | Admitting: Orthopedic Surgery

## 2019-08-03 DIAGNOSIS — I87332 Chronic venous hypertension (idiopathic) with ulcer and inflammation of left lower extremity: Secondary | ICD-10-CM

## 2019-08-03 DIAGNOSIS — L97929 Non-pressure chronic ulcer of unspecified part of left lower leg with unspecified severity: Secondary | ICD-10-CM | POA: Diagnosis not present

## 2019-08-04 ENCOUNTER — Encounter: Payer: Self-pay | Admitting: Orthopedic Surgery

## 2019-08-04 NOTE — Progress Notes (Signed)
Office Visit Note   Patient: [name]           Date of Birth: 11-15-1950           MRN: [id number] Visit Date: 08/03/2019              Requested by: No referring provider defined for this encounter. PCP: Patient, No Pcp Per  Chief Complaint  Patient presents with  . Left Leg - Follow-up      HPI: Patient is a 69 year old gentleman who presents in follow-up for venous insufficiency left lateral malleolus ulcer.  Patient states he is currently been diagnosed with diabetes is on oral medication.  Patient is still using the Trental and nitroglycerin patch.  Is not on any blood thinners.  Patient is currently wearing the medical compression stocking with Dial soap cleansing.  Patient states that the sock feels tight.  Assessment & Plan: Visit Diagnoses:  1. 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  Recommended continue with his current care will reevaluate in 1 week.  Follow-Up Instructions: Return in about 1 week (around 08/10/2019).   Ortho Exam  Patient is alert, oriented, no adenopathy, well-dressed, normal affect, normal respiratory effort. Examination patient does have some ecchymosis and bruising in his leg from some venous insufficiency.  There is 1 skin tear.  The lateral malleolar ulcer is flat 2 cm in diameter 0.1 mm deep with no signs of infection no signs of exposed bone or tendon the ulcer does show interval healing.  Imaging: No results found. No images are attached to the encounter.  Labs: Lab Results  Component Value Date   HGBA1C (H) 09/19/2010    6.4 (NOTE)                                                                       According to the ADA Clinical Practice Recommendations for 2011, when HbA1c is used as a screening test:   >=6.5%   Diagnostic of Diabetes Mellitus           (if abnormal result  is confirmed)  5.7-6.4%   Increased risk of developing Diabetes Mellitus  References:Diagnosis and Classification of Diabetes Mellitus,Diabetes [id number] 1):S62-S69 and Standards of Medical Care in         Diabetes - 2011,Diabetes Care,2011,34  (Suppl 1):S11-S61.   HGBA1C (H) 10/30/2009    7.0 (NOTE)                                                                       According to the ADA Clinical Practice Recommendations for 2011, when HbA1c is used as a screening test:   >=6.5%   Diagnostic of Diabetes Mellitus           (if abnormal result  is confirmed)  5.7-6.4%   Increased risk of developing Diabetes  [place]  References:Diagnosis and Classification of Diabetes Mellitus,Diabetes D8842878 1):S62-S69 and Standards of Medical Care in         Diabetes - 2011,Diabetes [id number]  (Suppl 1):S11-S61.   HGBA1C  09/29/2007    5.7 (NOTE)   The ADA recommends the following therapeutic goals for glycemic   control related to Hgb A1C measurement:   Goal of Therapy:   < 7.0% Hgb A1C   Action Suggested:  > 8.0% Hgb A1C   Ref:  Diabetes Care, 22, Suppl. 1, 1999   ESRSEDRATE 17 (H) 10/30/2009   CRP 4.3 (H) 10/30/2009   REPTSTATUS 12/19/2018 FINAL 12/17/2018   GRAMSTAIN NO WBC SEEN NO ORGANISMS SEEN  12/17/2018   CULT  12/17/2018    FEW NORMAL SKIN FLORA Performed at [facility], [place] [address], [place], [place] [address]    LABORGA METHICILLIN RESISTANT STAPHYLOCOCCUS AUREUS 10/17/2007     Lab Results  Component Value Date   ALBUMIN 4.4 08/31/2015   ALBUMIN 4.1 01/27/2014   ALBUMIN 4.3 11/01/2013    No results found for: MG No results found for: VD25OH  No results found for: PREALBUMIN CBC EXTENDED Latest Ref Rng & Units 01/29/2019 08/31/2015 11/01/2013  WBC 4.0 - 10.5 K/uL - 5.5 6.9  RBC 4.22 - 5.81 MIL/uL - 4.85 5.10  HGB 13.0 - 17.0 g/dL 15.6 15.3 16.2  HCT 39.0 - 52.0 % 46.0 46.1 47.9  PLT 150 - 400 K/uL - 180 298  NEUTROABS 1.7 - 7.7 K/uL - - 5.0    LYMPHSABS 0.7 - 4.0 K/uL - - 1.0     There is no height or weight on file to calculate BMI.  Orders:  No orders of the defined types were placed in this encounter.  No orders of the defined types were placed in this encounter.    Procedures: No procedures performed  Clinical Data: No additional findings.  ROS:  All other systems negative, except as noted in the HPI. Review of Systems  Objective: Vital Signs: There were no vitals taken for this visit.  Specialty Comments:  No specialty comments available.  PMFS History: Patient Active Problem List   Diagnosis Date Noted  . Hx of adenomatous colonic polyps 07/25/2015  . Chest pain 01/05/2014  . Coronary artery disease 02/28/2011  . Glucose intolerance (pre-diabetes) 02/28/2011  . ALCOHOL ABUSE 11/21/2009  . WOUND, FOOT 02/11/2009  . VENOUS INSUFFICIENCY, CHRONIC, RIGHT LEG 06/07/2008  . HYPERLIPIDEMIA 03/11/2007  . HYPERTENSION 03/11/2007  . LOW BACK PAIN 03/11/2007   Past Medical History:  Diagnosis Date  . Alcohol abuse   . CAD (coronary artery disease)    a. s/p CABG 2009 (Dr. Roxy Manns - LIMA-OM2, L radial - PDA, SVG-Dx) // b. Nuc 8/15: No ischemia, EF 65, normal study // c. Nuc 3/17: No significant reversible ischemia. LVEF 64% with normal wall motion. This is a low risk study.   . Carotid stenosis    Carotid US 3/19: Bilateral ICA 1-39  . DDD (degenerative disc disease)   . Gout   . HLD (hyperlipidemia)   . Hx of adenomatous colonic polyps 07/25/2015  . Hyperglycemia   . Hypertension   . Lumbago    (low back pain)   . Neuropathy   . PAF (paroxysmal atrial fibrillation) ([place])    Anticoag (previously on Xarelto) stopped in 2017 due to concerns over bleeding risk in the setting of heavy alcohol use)  . Paroxysmal atrial flutter ([place])    TEE 6/15: EF 50-55, no  RWMA, mild MR, LAE, no LAA clot >> s/p DCCV to restore NSR  . S/P CABG x 1   . Sciatica     Family History  Problem Relation Age of Onset  .  Coronary artery disease Unknown        family history  . Heart disease Father        CABG   . Heart attack Father   . Stroke Unknown        family history  . Colon cancer Neg Hx   . Colon polyps Neg Hx   . Esophageal cancer Neg Hx   . Rectal cancer Neg Hx   . Stomach cancer Neg Hx     Past Surgical History:  Procedure Laterality Date  . ABDOMINAL AORTOGRAM W/LOWER EXTREMITY Bilateral 01/29/2019   Procedure: ABDOMINAL AORTOGRAM W/LOWER EXTREMITY;  Surgeon: Angelia Mould, MD;  Location: [place] [facility];  Service: Cardiovascular;  Laterality: Bilateral;  . CARDIAC CATHETERIZATION  4.17.09  . CARDIOVERSION N/A 11/03/2013   Procedure: CARDIOVERSION;  Surgeon: Dorothy Spark, MD;  Location: [facility] ENDOSCOPY;  Service: Cardiovascular;  Laterality: N/A;  . COLONOSCOPY  10-01-2002   gessner-tics and hems   . CORONARY ARTERY BYPASS GRAFT  5.8.09   x3  . INGUINAL HERNIA REPAIR Left   . LAPAROSCOPIC INGUINAL HERNIA REPAIR Right   . TEE WITHOUT CARDIOVERSION N/A 11/03/2013   Procedure: TRANSESOPHAGEAL ECHOCARDIOGRAM (TEE);  Surgeon: Dorothy Spark, MD;  Location: [place];  Service: Cardiovascular;  Laterality: N/A;  . TONSILLECTOMY AND ADENOIDECTOMY    . WRIST SURGERY     Social History   Occupational History  . Not on file  Tobacco Use  . Smoking status: Never Smoker  . Smokeless tobacco: Never Used  Substance and Sexual Activity  . Alcohol use: Yes    Alcohol/week: 0.0 standard drinks    Comment: 6 beers per week  . Drug use: No  . Sexual activity: Not on file

## 2019-08-10 ENCOUNTER — Ambulatory Visit: Payer: Medicare Other | Admitting: Orthopedic Surgery

## 2019-08-12 ENCOUNTER — Other Ambulatory Visit: Payer: Self-pay

## 2019-08-12 ENCOUNTER — Ambulatory Visit (INDEPENDENT_AMBULATORY_CARE_PROVIDER_SITE_OTHER): Payer: Medicare Other | Admitting: Orthopedic Surgery

## 2019-08-12 ENCOUNTER — Encounter: Payer: Self-pay | Admitting: Orthopedic Surgery

## 2019-08-12 VITALS — Ht 72.0 in | Wt 200.0 lb

## 2019-08-12 DIAGNOSIS — I87332 Chronic venous hypertension (idiopathic) with ulcer and inflammation of left lower extremity: Secondary | ICD-10-CM

## 2019-08-12 DIAGNOSIS — L97929 Non-pressure chronic ulcer of unspecified part of left lower leg with unspecified severity: Secondary | ICD-10-CM

## 2019-08-12 DIAGNOSIS — I739 Peripheral vascular disease, unspecified: Secondary | ICD-10-CM

## 2019-08-12 NOTE — Progress Notes (Signed)
Office Visit Note   Patient: [name]           Date of Birth: Oct 26, 1950           MRN: [id number] Visit Date: 08/12/2019              Requested by: No referring provider defined for this encounter. PCP: Patient, No Pcp Per  Chief Complaint  Patient presents with  . Left Leg - Follow-up      HPI: Patient is a 69 year old gentleman who presents in follow-up for mixed arterial venous insufficiency left lower extremity with an ischemic ulcer over the lateral malleolus.  Patient has been evaluated with an arteriogram has a patent posterior tibial artery occluded anterior tibial artery with an ABI of 91%.  Assessment & Plan: Visit Diagnoses:  1. Chronic venous hypertension (idiopathic) with ulcer and inflammation of left lower extremity (HCC)   2. PVD (peripheral vascular disease) ([place])     Plan: Recommended patient continue with conservative treatment.  Discussed risks of complete arterial occlusion with endovascular procedure.  Patient is still slowly healing and recommend he continue with the compression stocking nitroglycerin patch and Trental.  Follow-Up Instructions: Return in about 2 weeks (around 08/26/2019).   Ortho Exam  Patient is alert, oriented, no adenopathy, well-dressed, normal affect, normal respiratory effort. Examination a Doppler was used patient has a strong posterior tibial biphasic pulse.  Anterior tibial pulses monophasic and most likely retrograde with his anterior tibial occlusion.  Patient does have an ulcer over the base of the fifth metatarsal.  After informed consent a 10 blade knife was used to debride the skin and soft tissue back to healthy viable tissue.  Semination the lateral malleolus also there is a large area of ischemic eschar.  After informed consent a 10 blade knife was used to debride the skin and soft tissue back to 50% healthy viable tissue the remainder had some eschar present.  This wound is 2 cm in diameter 1 mm deep.  Patient's  right calf is 37 cm in circumference left calf is 34 patient does have some ecchymosis and bruising and brawny skin color changes in his legs secondary to venous insufficiency.  Imaging: No results found. No images are attached to the encounter.  Labs: Lab Results  Component Value Date   HGBA1C (H) 09/19/2010    6.4 (NOTE)                                                                       According to the ADA Clinical Practice Recommendations for 2011, when HbA1c is used as a screening test:   >=6.5%   Diagnostic of Diabetes Mellitus           (if abnormal result  is confirmed)  5.7-6.4%   Increased risk of developing Diabetes Mellitus  References:Diagnosis and Classification of Diabetes Mellitus,Diabetes [id number] 1):S62-S69 and Standards of Medical Care in         Diabetes - 2011,Diabetes Care,2011,34  (Suppl 1):S11-S61.   HGBA1C (H) 10/30/2009    7.0 (NOTE)  According to the ADA Clinical Practice Recommendations for 2011, when HbA1c is used as a screening test:   >=6.5%   Diagnostic of Diabetes Mellitus           (if abnormal result  is confirmed)  5.7-6.4%   Increased risk of developing Diabetes Mellitus  References:Diagnosis and Classification of Diabetes Mellitus,Diabetes [id number] 1):S62-S69 and Standards of Medical Care in         Diabetes - 2011,Diabetes [id number]  (Suppl 1):S11-S61.   HGBA1C  09/29/2007    5.7 (NOTE)   The ADA recommends the following therapeutic goals for glycemic   control related to Hgb A1C measurement:   Goal of Therapy:   < 7.0% Hgb A1C   Action Suggested:  > 8.0% Hgb A1C   Ref:  Diabetes Care, 22, Suppl. 1, 1999   ESRSEDRATE 17 (H) 10/30/2009   CRP 4.3 (H) 10/30/2009   REPTSTATUS 12/19/2018 FINAL 12/17/2018   GRAMSTAIN NO WBC SEEN NO ORGANISMS SEEN  12/17/2018   CULT  12/17/2018    FEW NORMAL SKIN FLORA Performed at [facility], [place] [address]  [place], [place] [address]    LABORGA METHICILLIN RESISTANT STAPHYLOCOCCUS AUREUS 10/17/2007     Lab Results  Component Value Date   ALBUMIN 4.4 08/31/2015   ALBUMIN 4.1 01/27/2014   ALBUMIN 4.3 11/01/2013    No results found for: MG No results found for: VD25OH  No results found for: PREALBUMIN CBC EXTENDED Latest Ref Rng & Units 01/29/2019 08/31/2015 11/01/2013  WBC 4.0 - 10.5 K/uL - 5.5 6.9  RBC 4.22 - 5.81 MIL/uL - 4.85 5.10  HGB 13.0 - 17.0 g/dL 15.6 15.3 16.2  HCT 39.0 - 52.0 % 46.0 46.1 47.9  PLT 150 - 400 K/uL - 180 298  NEUTROABS 1.7 - 7.7 K/uL - - 5.0  LYMPHSABS 0.7 - 4.0 K/uL - - 1.0     Body mass index is 27.12 kg/m.  Orders:  No orders of the defined types were placed in this encounter.  No orders of the defined types were placed in this encounter.    Procedures: No procedures performed  Clinical Data: No additional findings.  ROS:  All other systems negative, except as noted in the HPI. Review of Systems  Objective: Vital Signs: Ht 6' (1.829 m)   Wt 200 lb (90.7 kg)   BMI 27.12 kg/m   Specialty Comments:  No specialty comments available.  PMFS History: Patient Active Problem List   Diagnosis Date Noted  . Hx of adenomatous colonic polyps 07/25/2015  . Chest pain 01/05/2014  . Coronary artery disease 02/28/2011  . Glucose intolerance (pre-diabetes) 02/28/2011  . ALCOHOL ABUSE 11/21/2009  . WOUND, FOOT 02/11/2009  . VENOUS INSUFFICIENCY, CHRONIC, RIGHT LEG 06/07/2008  . HYPERLIPIDEMIA 03/11/2007  . HYPERTENSION 03/11/2007  . LOW BACK PAIN 03/11/2007   Past Medical History:  Diagnosis Date  . Alcohol abuse   . CAD (coronary artery disease)    a. s/p CABG 2009 (Dr. Roxy Manns - LIMA-OM2, L radial - PDA, SVG-Dx) // b. Nuc 8/15: No ischemia, EF 65, normal study // c. Nuc 3/17: No significant reversible ischemia. LVEF 64% with normal wall motion. This is a low risk study.   . Carotid stenosis    Carotid US 3/19: Bilateral ICA 1-39  . DDD  (degenerative disc disease)   . Gout   . HLD (hyperlipidemia)   . Hx of adenomatous colonic polyps 07/25/2015  . Hyperglycemia   . Hypertension   . Lumbago    (  low back pain)   . Neuropathy   . PAF (paroxysmal atrial fibrillation) ([place])    Anticoag (previously on Xarelto) stopped in 2017 due to concerns over bleeding risk in the setting of heavy alcohol use)  . Paroxysmal atrial flutter ([place])    TEE 6/15: EF 50-55, no RWMA, mild MR, LAE, no LAA clot >> s/p DCCV to restore NSR  . S/P CABG x 1   . Sciatica     Family History  Problem Relation Age of Onset  . Coronary artery disease Unknown        family history  . Heart disease Father        CABG   . Heart attack Father   . Stroke Unknown        family history  . Colon cancer Neg Hx   . Colon polyps Neg Hx   . Esophageal cancer Neg Hx   . Rectal cancer Neg Hx   . Stomach cancer Neg Hx     Past Surgical History:  Procedure Laterality Date  . ABDOMINAL AORTOGRAM W/LOWER EXTREMITY Bilateral 01/29/2019   Procedure: ABDOMINAL AORTOGRAM W/LOWER EXTREMITY;  Surgeon: Angelia Mould, MD;  Location: [place] [facility];  Service: Cardiovascular;  Laterality: Bilateral;  . CARDIAC CATHETERIZATION  4.17.09  . CARDIOVERSION N/A 11/03/2013   Procedure: CARDIOVERSION;  Surgeon: Dorothy Spark, MD;  Location: [facility] ENDOSCOPY;  Service: Cardiovascular;  Laterality: N/A;  . COLONOSCOPY  10-01-2002   gessner-tics and hems   . CORONARY ARTERY BYPASS GRAFT  5.8.09   x3  . INGUINAL HERNIA REPAIR Left   . LAPAROSCOPIC INGUINAL HERNIA REPAIR Right   . TEE WITHOUT CARDIOVERSION N/A 11/03/2013   Procedure: TRANSESOPHAGEAL ECHOCARDIOGRAM (TEE);  Surgeon: Dorothy Spark, MD;  Location: [place];  Service: Cardiovascular;  Laterality: N/A;  . TONSILLECTOMY AND ADENOIDECTOMY    . WRIST SURGERY     Social History   Occupational History  . Not on file  Tobacco Use  . Smoking status: Never Smoker  . Smokeless tobacco: Never Used    Substance and Sexual Activity  . Alcohol use: Yes    Alcohol/week: 0.0 standard drinks    Comment: 6 beers per week  . Drug use: No  . Sexual activity: Not on file

## 2019-08-24 ENCOUNTER — Encounter: Payer: Self-pay | Admitting: Orthopedic Surgery

## 2019-08-24 ENCOUNTER — Other Ambulatory Visit: Payer: Self-pay

## 2019-08-24 ENCOUNTER — Telehealth: Payer: Self-pay

## 2019-08-24 ENCOUNTER — Ambulatory Visit (INDEPENDENT_AMBULATORY_CARE_PROVIDER_SITE_OTHER): Payer: Medicare Other | Admitting: Orthopedic Surgery

## 2019-08-24 VITALS — Ht 72.0 in | Wt 200.0 lb

## 2019-08-24 DIAGNOSIS — I87332 Chronic venous hypertension (idiopathic) with ulcer and inflammation of left lower extremity: Secondary | ICD-10-CM | POA: Diagnosis not present

## 2019-08-24 DIAGNOSIS — I739 Peripheral vascular disease, unspecified: Secondary | ICD-10-CM | POA: Diagnosis not present

## 2019-08-24 DIAGNOSIS — L97929 Non-pressure chronic ulcer of unspecified part of left lower leg with unspecified severity: Secondary | ICD-10-CM

## 2019-08-24 NOTE — Telephone Encounter (Signed)
Submitted for VOB for Synvisc One-Left knee °

## 2019-08-24 NOTE — Progress Notes (Signed)
Office Visit Note   Patient: [name]           Date of Birth: 1950/07/28           MRN: [id number] Visit Date: 08/24/2019              Requested by: No referring provider defined for this encounter. PCP: Patient, No Pcp Per  Chief Complaint  Patient presents with  . Left Leg - Follow-up      HPI: Patient is a 69 year old gentleman who presents in follow-up for arterial and venous insufficiency ulceration lateral malleolus left ankle.  Patient states he also has tricompartmental osteoarthritis of the left knee which has been treated in the past with hyaluronic acid with good results.  He states it has been 2 years since his last injection.  Patient is seeing vascular vein surgery and vascular intervention was not recommended due to the patient's advanced disease and risk of the procedure.  Patient is currently using the size large medical compression stocking he is using Trental and a nitroglycerin patch.  Assessment & Plan: Visit Diagnoses:  1. Chronic venous hypertension (idiopathic) with ulcer and inflammation of left lower extremity (HCC)   2. PVD (peripheral vascular disease) ([place])     Plan: We will request authorization for Synvisc 1 for injection of the left knee.  Patient will continue with his current treatment.  Follow-Up Instructions: Return in about 2 weeks (around 09/07/2019).   Ortho Exam  Patient is alert, oriented, no adenopathy, well-dressed, normal affect, normal respiratory effort. Examination patient has crepitation and pain with range of motion the left knee collaterals and cruciates are stable there is no effusion no cellulitis.  Examination of the left leg patient has brawny skin color changes there is a arterial venous ulcer over the lateral malleolus which is 10 mm in diameter 1 mm deep with 100% fibrinous exudative tissue.  There is no cellulitis no drainage no odor no exposed bone or tendon.  Imaging: No results found. No images are attached to  the encounter.  Labs: Lab Results  Component Value Date   HGBA1C (H) 09/19/2010    6.4 (NOTE)                                                                       According to the ADA Clinical Practice Recommendations for 2011, when HbA1c is used as a screening test:   >=6.5%   Diagnostic of Diabetes Mellitus           (if abnormal result  is confirmed)  5.7-6.4%   Increased risk of developing Diabetes Mellitus  References:Diagnosis and Classification of Diabetes Mellitus,Diabetes [id number] 1):S62-S69 and Standards of Medical Care in         Diabetes - 2011,Diabetes Care,2011,34  (Suppl 1):S11-S61.   HGBA1C (H) 10/30/2009    7.0 (NOTE)  According to the ADA Clinical Practice Recommendations for 2011, when HbA1c is used as a screening test:   >=6.5%   Diagnostic of Diabetes Mellitus           (if abnormal result  is confirmed)  5.7-6.4%   Increased risk of developing Diabetes Mellitus  References:Diagnosis and Classification of Diabetes Mellitus,Diabetes [id number] 1):S62-S69 and Standards of Medical Care in         Diabetes - 2011,Diabetes [id number]  (Suppl 1):S11-S61.   HGBA1C  09/29/2007    5.7 (NOTE)   The ADA recommends the following therapeutic goals for glycemic   control related to Hgb A1C measurement:   Goal of Therapy:   < 7.0% Hgb A1C   Action Suggested:  > 8.0% Hgb A1C   Ref:  Diabetes Care, 22, Suppl. 1, 1999   ESRSEDRATE 17 (H) 10/30/2009   CRP 4.3 (H) 10/30/2009   REPTSTATUS 12/19/2018 FINAL 12/17/2018   GRAMSTAIN NO WBC SEEN NO ORGANISMS SEEN  12/17/2018   CULT  12/17/2018    FEW NORMAL SKIN FLORA Performed at [facility], [place] [address], [place], [place] [address]    LABORGA METHICILLIN RESISTANT STAPHYLOCOCCUS AUREUS 10/17/2007     Lab Results  Component Value Date   ALBUMIN 4.4 08/31/2015   ALBUMIN 4.1 01/27/2014   ALBUMIN 4.3 11/01/2013    No results found for: MG  No results found for: VD25OH  No results found for: PREALBUMIN CBC EXTENDED Latest Ref Rng & Units 01/29/2019 08/31/2015 11/01/2013  WBC 4.0 - 10.5 K/uL - 5.5 6.9  RBC 4.22 - 5.81 MIL/uL - 4.85 5.10  HGB 13.0 - 17.0 g/dL 15.6 15.3 16.2  HCT 39.0 - 52.0 % 46.0 46.1 47.9  PLT 150 - 400 K/uL - 180 298  NEUTROABS 1.7 - 7.7 K/uL - - 5.0  LYMPHSABS 0.7 - 4.0 K/uL - - 1.0     Body mass index is 27.12 kg/m.  Orders:  No orders of the defined types were placed in this encounter.  No orders of the defined types were placed in this encounter.    Procedures: No procedures performed  Clinical Data: No additional findings.  ROS:  All other systems negative, except as noted in the HPI. Review of Systems  Objective: Vital Signs: Ht 6' (1.829 m)   Wt 200 lb (90.7 kg)   BMI 27.12 kg/m   Specialty Comments:  No specialty comments available.  PMFS History: Patient Active Problem List   Diagnosis Date Noted  . Hx of adenomatous colonic polyps 07/25/2015  . Chest pain 01/05/2014  . Coronary artery disease 02/28/2011  . Glucose intolerance (pre-diabetes) 02/28/2011  . ALCOHOL ABUSE 11/21/2009  . WOUND, FOOT 02/11/2009  . VENOUS INSUFFICIENCY, CHRONIC, RIGHT LEG 06/07/2008  . HYPERLIPIDEMIA 03/11/2007  . HYPERTENSION 03/11/2007  . LOW BACK PAIN 03/11/2007   Past Medical History:  Diagnosis Date  . Alcohol abuse   . CAD (coronary artery disease)    a. s/p CABG 2009 (Dr. Roxy Manns - LIMA-OM2, L radial - PDA, SVG-Dx) // b. Nuc 8/15: No ischemia, EF 65, normal study // c. Nuc 3/17: No significant reversible ischemia. LVEF 64% with normal wall motion. This is a low risk study.   . Carotid stenosis    Carotid US 3/19: Bilateral ICA 1-39  . DDD (degenerative disc disease)   . Gout   . HLD (hyperlipidemia)   . Hx of adenomatous colonic polyps 07/25/2015  . Hyperglycemia   . Hypertension   . Lumbago    (  low back pain)   . Neuropathy   . PAF (paroxysmal atrial fibrillation) ([place])     Anticoag (previously on Xarelto) stopped in 2017 due to concerns over bleeding risk in the setting of heavy alcohol use)  . Paroxysmal atrial flutter ([place])    TEE 6/15: EF 50-55, no RWMA, mild MR, LAE, no LAA clot >> s/p DCCV to restore NSR  . S/P CABG x 1   . Sciatica     Family History  Problem Relation Age of Onset  . Coronary artery disease Unknown        family history  . Heart disease Father        CABG   . Heart attack Father   . Stroke Unknown        family history  . Colon cancer Neg Hx   . Colon polyps Neg Hx   . Esophageal cancer Neg Hx   . Rectal cancer Neg Hx   . Stomach cancer Neg Hx     Past Surgical History:  Procedure Laterality Date  . ABDOMINAL AORTOGRAM W/LOWER EXTREMITY Bilateral 01/29/2019   Procedure: ABDOMINAL AORTOGRAM W/LOWER EXTREMITY;  Surgeon: Angelia Mould, MD;  Location: [place] [facility];  Service: Cardiovascular;  Laterality: Bilateral;  . CARDIAC CATHETERIZATION  4.17.09  . CARDIOVERSION N/A 11/03/2013   Procedure: CARDIOVERSION;  Surgeon: Dorothy Spark, MD;  Location: [facility] ENDOSCOPY;  Service: Cardiovascular;  Laterality: N/A;  . COLONOSCOPY  10-01-2002   gessner-tics and hems   . CORONARY ARTERY BYPASS GRAFT  5.8.09   x3  . INGUINAL HERNIA REPAIR Left   . LAPAROSCOPIC INGUINAL HERNIA REPAIR Right   . TEE WITHOUT CARDIOVERSION N/A 11/03/2013   Procedure: TRANSESOPHAGEAL ECHOCARDIOGRAM (TEE);  Surgeon: Dorothy Spark, MD;  Location: [place];  Service: Cardiovascular;  Laterality: N/A;  . TONSILLECTOMY AND ADENOIDECTOMY    . WRIST SURGERY     Social History   Occupational History  . Not on file  Tobacco Use  . Smoking status: Never Smoker  . Smokeless tobacco: Never Used  Substance and Sexual Activity  . Alcohol use: Yes    Alcohol/week: 0.0 standard drinks    Comment: 6 beers per week  . Drug use: No  . Sexual activity: Not on file

## 2019-08-24 NOTE — Telephone Encounter (Signed)
Pt would like Synvisc one injection left knee OA

## 2019-08-25 ENCOUNTER — Telehealth: Payer: Self-pay

## 2019-08-25 NOTE — Telephone Encounter (Signed)
Approved for Synvisc One- Left knee Dr. Baruch Gouty and Bill $30 copay No OOP No prior auth required  [place]

## 2019-08-25 NOTE — Telephone Encounter (Signed)
Tried calling pt to schedule. No answer. Went to voice mail and mailbox was full. I will try again at a later time

## 2019-08-26 NOTE — Telephone Encounter (Signed)
FYI  I have been unable to reach pt on both numbers. I added injection information to appointment note.

## 2019-08-26 NOTE — Telephone Encounter (Signed)
Tried again, still no answer and voice mail is full

## 2019-08-27 NOTE — Telephone Encounter (Signed)
Noted  

## 2019-09-07 ENCOUNTER — Ambulatory Visit (INDEPENDENT_AMBULATORY_CARE_PROVIDER_SITE_OTHER): Payer: Medicare Other | Admitting: Orthopedic Surgery

## 2019-09-07 ENCOUNTER — Other Ambulatory Visit: Payer: Self-pay

## 2019-09-07 ENCOUNTER — Encounter: Payer: Self-pay | Admitting: Orthopedic Surgery

## 2019-09-07 ENCOUNTER — Telehealth: Payer: Self-pay

## 2019-09-07 VITALS — Ht 72.0 in | Wt 200.0 lb

## 2019-09-07 DIAGNOSIS — M1712 Unilateral primary osteoarthritis, left knee: Secondary | ICD-10-CM | POA: Diagnosis not present

## 2019-09-07 DIAGNOSIS — M170 Bilateral primary osteoarthritis of knee: Secondary | ICD-10-CM

## 2019-09-07 DIAGNOSIS — L97929 Non-pressure chronic ulcer of unspecified part of left lower leg with unspecified severity: Secondary | ICD-10-CM | POA: Diagnosis not present

## 2019-09-07 DIAGNOSIS — I87332 Chronic venous hypertension (idiopathic) with ulcer and inflammation of left lower extremity: Secondary | ICD-10-CM | POA: Diagnosis not present

## 2019-09-07 MED ORDER — LIDOCAINE HCL 1 % IJ SOLN
1.0000 mL | INTRAMUSCULAR | Status: AC | PRN
  Administered 2019-09-07: 17:00:00 1 mL

## 2019-09-07 MED ORDER — HYLAN G-F 20 48 MG/6ML IX SOSY
48.0000 mg | PREFILLED_SYRINGE | INTRA_ARTICULAR | Status: AC | PRN
  Administered 2019-09-07: 48 mg via INTRA_ARTICULAR

## 2019-09-07 NOTE — Progress Notes (Signed)
Office Visit Note   Patient: [name]           Date of Birth: 1951/04/16           MRN: [id number] Visit Date: 09/07/2019              Requested by: No referring provider defined for this encounter. PCP: Patient, No Pcp Per  Chief Complaint  Patient presents with  . Left Knee - Follow-up    synvisc one buy and bill      HPI: Patient is a 69 year old gentleman who presents today for Synvisc 1 injection of his left knee.  He has a history of bilateral knee osteoarthritis.  He has had Synvisc injections of bilateral knees in the past.  States his last injection of his right knee was several years ago he did have some mild relief with the Depo-Medrol but feels he would benefit most from a Synvisc supplemental injection of the right knee is requesting that we get this approved today.  He has been having pain with weightbearing some mild pain at rest.  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 Patient is seeing vascular vein surgery and vascular intervention was not recommended due to the patient's advanced disease and risk of the procedure.  Patient is currently using the size large medical compression stocking he is using Trental and a nitroglycerin patch.  Assessment & Plan: Visit Diagnoses: No diagnosis found.  Plan: Synvisc 1 injection of the left knee today.  We will request approval for Synvisc injection of his right knee. Dr. Sharol Given to the bedside to evaluate his venous ulcerations to his left lower extremity he will continue with his medical compression stocking.  Follow-Up Instructions: No follow-ups on file.   Right Knee Exam   Muscle Strength  The patient has normal right knee strength.  Tenderness  The patient is experiencing tenderness in the lateral joint line and medial joint line.  Range of Motion  The patient has normal right knee ROM.  Tests  Varus: negative  Valgus: negative  Other  Erythema: absent Swelling: mild Effusion: no effusion present      Patient is alert, oriented, no adenopathy, well-dressed, normal affect, normal respiratory effort. Examination of the left leg patient has brawny skin color changes there is a arterial venous ulcer over the lateral malleolus which is 10 mm in diameter 2 mm deep with 100% fibrinous exudative tissue.  There is no cellulitis no drainage no odor no exposed bone or tendon.  Imaging: No results found. No images are attached to the encounter.  Labs: Lab Results  Component Value Date   HGBA1C (H) 09/19/2010    6.4 (NOTE)                                                                       According to the ADA Clinical Practice Recommendations for 2011, when HbA1c is used as a screening test:   >=6.5%   Diagnostic of Diabetes Mellitus           (if abnormal result  is confirmed)  5.7-6.4%   Increased risk of developing Diabetes Mellitus  References:Diagnosis and Classification of  Diabetes Mellitus,Diabetes [id number] 1):S62-S69 and Standards of Medical Care in         Diabetes - 2011,Diabetes [id number]  (Suppl 1):S11-S61.   HGBA1C (H) 10/30/2009    7.0 (NOTE)                                                                       According to the ADA Clinical Practice Recommendations for 2011, when HbA1c is used as a screening test:   >=6.5%   Diagnostic of Diabetes Mellitus           (if abnormal result  is confirmed)  5.7-6.4%   Increased risk of developing Diabetes Mellitus  References:Diagnosis and Classification of Diabetes Mellitus,Diabetes [id number] 1):S62-S69 and Standards of Medical Care in         Diabetes - 2011,Diabetes P3829181  (Suppl 1):S11-S61.   HGBA1C  09/29/2007    5.7 (NOTE)   The ADA recommends the following therapeutic goals for glycemic   control related to Hgb A1C measurement:   Goal of Therapy:   < 7.0% Hgb A1C   Action Suggested:  > 8.0% Hgb A1C   Ref:   Diabetes Care, 22, Suppl. 1, 1999   ESRSEDRATE 17 (H) 10/30/2009   CRP 4.3 (H) 10/30/2009   REPTSTATUS 12/19/2018 FINAL 12/17/2018   GRAMSTAIN NO WBC SEEN NO ORGANISMS SEEN  12/17/2018   CULT  12/17/2018    FEW NORMAL SKIN FLORA Performed at [facility], [address] [place], [place] [address]    LABORGA METHICILLIN RESISTANT STAPHYLOCOCCUS AUREUS 10/17/2007     Lab Results  Component Value Date   ALBUMIN 4.4 08/31/2015   ALBUMIN 4.1 01/27/2014   ALBUMIN 4.3 11/01/2013    No results found for: MG No results found for: VD25OH  No results found for: PREALBUMIN CBC EXTENDED Latest Ref Rng & Units 01/29/2019 08/31/2015 11/01/2013  WBC 4.0 - 10.5 K/uL - 5.5 6.9  RBC 4.22 - 5.81 MIL/uL - 4.85 5.10  HGB 13.0 - 17.0 g/dL 15.6 15.3 16.2  HCT 39.0 - 52.0 % 46.0 46.1 47.9  PLT 150 - 400 K/uL - 180 298  NEUTROABS 1.7 - 7.7 K/uL - - 5.0  LYMPHSABS 0.7 - 4.0 K/uL - - 1.0     Body mass index is 27.12 kg/m.  Orders:  No orders of the defined types were placed in this encounter.  No orders of the defined types were placed in this encounter.    Procedures: Large Joint Inj: L knee on 09/07/2019 4:38 PM Indications: pain Details: 18 G 1.5 in needle, anteromedial approach Medications: 1 mL lidocaine 1 %; 48 mg Hylan 48 MG/6ML Consent was given by the patient.      Clinical Data: No additional findings.  ROS:  All other systems negative, except as noted in the HPI. Review of Systems  Constitutional: Negative for chills and fever.  Musculoskeletal: Positive for arthralgias and joint swelling.    Objective: Vital Signs: Ht 6' (1.829 m)   Wt 200 lb (90.7 kg)   BMI 27.12 kg/m   Specialty Comments:  No specialty comments available.  PMFS History: Patient Active Problem List   Diagnosis Date Noted  . Hx of adenomatous colonic polyps 07/25/2015  . Chest pain 01/05/2014  .  Coronary artery disease 02/28/2011  . Glucose intolerance (pre-diabetes) 02/28/2011  .  ALCOHOL ABUSE 11/21/2009  . WOUND, FOOT 02/11/2009  . VENOUS INSUFFICIENCY, CHRONIC, RIGHT LEG 06/07/2008  . HYPERLIPIDEMIA 03/11/2007  . HYPERTENSION 03/11/2007  . LOW BACK PAIN 03/11/2007   Past Medical History:  Diagnosis Date  . Alcohol abuse   . CAD (coronary artery disease)    a. s/p CABG 2009 (Dr. Roxy Manns - LIMA-OM2, L radial - PDA, SVG-Dx) // b. Nuc 8/15: No ischemia, EF 65, normal study // c. Nuc 3/17: No significant reversible ischemia. LVEF 64% with normal wall motion. This is a low risk study.   . Carotid stenosis    Carotid US 3/19: Bilateral ICA 1-39  . DDD (degenerative disc disease)   . Gout   . HLD (hyperlipidemia)   . Hx of adenomatous colonic polyps 07/25/2015  . Hyperglycemia   . Hypertension   . Lumbago    (low back pain)   . Neuropathy   . PAF (paroxysmal atrial fibrillation) ([place])    Anticoag (previously on Xarelto) stopped in 2017 due to concerns over bleeding risk in the setting of heavy alcohol use)  . Paroxysmal atrial flutter ([place])    TEE 6/15: EF 50-55, no RWMA, mild MR, LAE, no LAA clot >> s/p DCCV to restore NSR  . S/P CABG x 1   . Sciatica     Family History  Problem Relation Age of Onset  . Coronary artery disease Unknown        family history  . Heart disease Father        CABG   . Heart attack Father   . Stroke Unknown        family history  . Colon cancer Neg Hx   . Colon polyps Neg Hx   . Esophageal cancer Neg Hx   . Rectal cancer Neg Hx   . Stomach cancer Neg Hx     Past Surgical History:  Procedure Laterality Date  . ABDOMINAL AORTOGRAM W/LOWER EXTREMITY Bilateral 01/29/2019   Procedure: ABDOMINAL AORTOGRAM W/LOWER EXTREMITY;  Surgeon: Angelia Mould, MD;  Location: [facility];  Service: Cardiovascular;  Laterality: Bilateral;  . CARDIAC CATHETERIZATION  4.17.09  . CARDIOVERSION N/A 11/03/2013   Procedure: CARDIOVERSION;  Surgeon: Dorothy Spark, MD;  Location: [facility];  Service: Cardiovascular;  Laterality:  N/A;  . COLONOSCOPY  10-01-2002   gessner-tics and hems   . CORONARY ARTERY BYPASS GRAFT  5.8.09   x3  . INGUINAL HERNIA REPAIR Left   . LAPAROSCOPIC INGUINAL HERNIA REPAIR Right   . TEE WITHOUT CARDIOVERSION N/A 11/03/2013   Procedure: TRANSESOPHAGEAL ECHOCARDIOGRAM (TEE);  Surgeon: Dorothy Spark, MD;  Location: [facility];  Service: Cardiovascular;  Laterality: N/A;  . TONSILLECTOMY AND ADENOIDECTOMY    . WRIST SURGERY     Social History   Occupational History  . Not on file  Tobacco Use  . Smoking status: Never Smoker  . Smokeless tobacco: Never Used  Substance and Sexual Activity  . Alcohol use: Yes    Alcohol/week: 0.0 standard drinks    Comment: 6 beers per week  . Drug use: No  . Sexual activity: Not on file

## 2019-09-07 NOTE — Telephone Encounter (Signed)
Pt in office for left knee injection today. Would like to proceed with prior auth for synvisc one injection for the right knee/

## 2019-09-07 NOTE — Telephone Encounter (Signed)
Submitted for VOB for Synvisc One-Right knee 

## 2019-09-08 ENCOUNTER — Telehealth: Payer: Self-pay

## 2019-09-08 NOTE — Telephone Encounter (Signed)
Submitted for VOB for Synvisc one-Right knee yesterday

## 2019-09-08 NOTE — Telephone Encounter (Signed)
Approved for Synvisc One-Right knee Dr. Baruch Gouty and Bill $30 copay No OOP No prior auth required   New start

## 2019-09-08 NOTE — Telephone Encounter (Signed)
-----   Message from Suzan Slick, NP sent at 09/07/2019  4:39 PM EDT ----- Synvisc one for the right knee please

## 2019-09-08 NOTE — Telephone Encounter (Signed)
Tried calling pt to inform but no answer and ability to leave a voice mail

## 2019-09-09 NOTE — Telephone Encounter (Signed)
FYI Unable to reach pt. This was added to his appointment notes since he is coming in on 09/21/19

## 2019-09-09 NOTE — Telephone Encounter (Signed)
Noted  

## 2019-09-15 NOTE — Progress Notes (Signed)
[name], [name] ([id number]) Visit Report for 07/15/2019 Arrival Information Details Patient Name: Date of Service: [name], [name] 07/15/2019 12:45 PM Medical Record [id number] Patient Account Number: [id number] Date of Birth/Sex: Treating RN: Aug 15, 1950 (69 y.o. [name], [name] Primary Care Raffael Bugarin: PATIENT, NO Other Clinician: Referring Neva Ramaswamy: Treating Lilo Wallington/Extender:Robson, [name], Designated Weeks in Treatment: 61 Visit Information History Since Last Visit Added or deleted any medications: No Patient Arrived: [name] Any new allergies or adverse reactions: No Arrival Time: 13:19 Had a fall or experienced change in No Accompanied By: self activities of daily living that may affect Transfer Assistance: None risk of falls: Patient Identification Verified: Yes Signs or symptoms of abuse/neglect since last No Secondary Verification Process Yes visito Completed: Hospitalized since last visit: No Patient Requires Transmission- No Implantable device outside of the clinic excluding No Based Precautions: cellular tissue based products placed in the center Patient Has Alerts: Yes ABIs 08/2018 L1.05 since last visit: Patient Alerts: Has Dressing in Place as Prescribed: Yes R0.76 Pain Present Now: Yes Electronic Signature(s) Signed: 09/15/2019 9:23:26 AM By: [name] Entered By: [name] on 07/15/2019 13:24:48 -------------------------------------------------------------------------------- Compression Therapy Details Patient Name: Date of Service: [name], [name] 07/15/2019 12:45 PM Medical Record [id number] Patient Account Number: [id number] Date of Birth/Sex: Treating RN: Apr 02, 1951 (69 y.o. [name] Primary Care Affie Gasner: PATIENT, NO Other Clinician: Referring Jadee Golebiewski: Treating [name]/Extender:Robson, [name], Designated Weeks in Treatment: 33 Compression Therapy Performed for Wound Wound #4 Left,Lateral  Malleolus Assessment: Performed By: Clinician Baruch Gouty, RN Compression Type: Three Layer Pre Treatment ABI: 1 Post Procedure Diagnosis Same as Pre-procedure Electronic Signature(s) Signed: 07/15/2019 5:57:11 PM By: [name] Entered By: [name] on 07/15/2019 14:16:00 -------------------------------------------------------------------------------- Encounter Discharge Information Details Patient Name: Date of Service: [name], [name] 07/15/2019 12:45 PM Medical Record [id number] Patient Account Number: [id number] Date of Birth/Sex: Treating RN: [birth year] (69 y.o. [name] Primary Care Chaze Hruska: PATIENT, NO Other Clinician: Referring Oline Belk: Treating [name]/Extender:Robson, [name], Designated Weeks in Treatment: 33 Encounter Discharge Information Items Post Procedure Vitals Discharge Condition: Stable Temperature (F): 98.1 Ambulatory Status: Cane Pulse (bpm): 79 Discharge Destination: Home Respiratory Rate (breaths/min): 18 Transportation: Private Auto Blood Pressure (mmHg): 166/85 Accompanied By: self Schedule Follow-up Appointment: Yes Clinical Summary of Care: Patient Declined Electronic Signature(s) Signed: 07/16/2019 5:30:09 PM By: Baruch Gouty RN, BSN Entered By: Baruch Gouty on 07/15/2019 14:33:11 -------------------------------------------------------------------------------- Lower Extremity Assessment Details Patient Name: Date of Service: [name], [name] 07/15/2019 12:45 PM Medical Record [id number] Patient Account Number: [id number] Date of Birth/Sex: Treating RN: 11/20/1950 (69 y.o. [name] Primary Care [name]: PATIENT, NO Other Clinician: Referring [name]: Treating [name]/Extender:Robson, [name], Designated Weeks in Treatment: 33 Edema Assessment Assessed: [Left: No] [Right: No] Edema: [Left: N] [Right: o] Calf Left: Right: Point of Measurement: 35 cm From Medial Instep 32.5  cm cm Ankle Left: Right: Point of Measurement: 9 cm From Medial Instep 23 cm cm Vascular Assessment Pulses: Dorsalis Pedis Palpable: [Left:Yes] Electronic Signature(s) Signed: 07/19/2019 6:14:31 PM By: [name] RN, BSN Entered By: [name] on 07/15/2019 13:44:15 -------------------------------------------------------------------------------- Multi Wound Chart Details Patient Name: Date of Service: [name]. 07/15/2019 12:45 PM Medical Record [id number] Patient Account Number: [id number] Date of Birth/Sex: Treating RN: March 25, 1951 (69 y.o. [name] Primary Care [name]: PATIENT, NO Other Clinician: Referring Antoine Fiallos: Treating Nayely Dingus/Extender:Robson, [name], Designated Weeks in Treatment: 33 Vital Signs Height(in): 73 Pulse(bpm): 72 Weight(lbs): 205 Blood Pressure(mmHg): 166/85 Body Mass Index(BMI): 27 Temperature(F): 98.1 Respiratory 18 Rate(breaths/min): Photos: [4:No Photos] [N/A:N/A]  Wound Location: [4:Left Malleolus - Lateral] [N/A:N/A] Wounding Event: [4:Trauma] [N/A:N/A] Primary Etiology: [4:Venous Leg Ulcer] [N/A:N/A] Comorbid History: [4:Arrhythmia, Coronary Artery N/A Disease, Type II Diabetes] Date Acquired: [4:11/02/2018] [N/A:N/A] Weeks of Treatment: [4:33] [N/A:N/A] Wound Status: [4:Open] [N/A:N/A] Measurements L x W x D 0.7x0.8x0.6 [N/A:N/A] (cm) Area (cm) : [4:0.44] [N/A:N/A] Volume (cm) : [4:0.264] [N/A:N/A] % Reduction in Area: [4:66.00%] [N/A:N/A] % Reduction in Volume: -103.10% [N/A:N/A] Classification: [4:Full Thickness Without Exposed Support Structures] [N/A:N/A] Exudate Amount: [4:Medium] [N/A:N/A] Exudate Type: [4:Serosanguineous] [N/A:N/A] Exudate Color: [4:red, brown] [N/A:N/A] Wound Margin: [4:Well defined, not attached N/A] Granulation Amount: [4:Large (67-100%)] [N/A:N/A] Granulation Quality: [4:Red] [N/A:N/A] Necrotic Amount: [4:Small (1-33%)] [N/A:N/A] Exposed Structures: [4:Fat Layer  (Subcutaneous N/A Tissue) Exposed: Yes Fascia: No Tendon: No Muscle: No Joint: No Bone: No] Epithelialization: [4:Small (1-33%)] [N/A:N/A] Assessment Notes: [4:maceration periwound.] [N/A:N/A] Procedures Performed: [4:Cellular or Tissue Based N/A Product Compression Therapy] Treatment Notes Electronic Signature(s) Signed: 07/15/2019 5:57:11 PM By: [name] Signed: 07/16/2019 1:06:39 PM By: Linton Ham MD Entered By: Linton Ham on 07/15/2019 14:11:55 -------------------------------------------------------------------------------- Multi-Disciplinary Care Plan Details Patient Name: Date of Service: [name]. 07/15/2019 12:45 PM Medical Record [id number] Patient Account Number: [id number] Date of Birth/Sex: Treating RN: 1951/01/24 (69 y.o. [name] Primary Care Deangela Randleman: PATIENT, NO Other Clinician: Referring Jerrica Thorman: Treating Souleymane Saiki/Extender:Robson, [name], Designated Weeks in Treatment: 33 Active Inactive Venous Leg Ulcer Nursing Diagnoses: Knowledge deficit related to disease process and management Potential for venous Insuffiency (use before diagnosis confirmed) Goals: Patient will maintain optimal edema control Date Initiated: 11/20/2018 Target Resolution Date: 09/03/2019 Goal Status: Active Patient/caregiver will verbalize understanding of disease process and disease management Date Initiated: 11/20/2018 Date Inactivated: 12/17/2018 Target Resolution Date: 12/18/2018 Goal Status: Met Interventions: Assess peripheral edema status every visit. Compression as ordered Provide education on venous insufficiency Treatment Activities: Therapeutic compression applied : 11/20/2018 Notes: Electronic Signature(s) Signed: 07/15/2019 5:57:11 PM By: [name] Entered By: [name] on 07/15/2019 13:15:43 -------------------------------------------------------------------------------- Pain Assessment Details Patient Name: Date of  Service: [name], [name] 07/15/2019 12:45 PM Medical Record [id number] Patient Account Number: [id number] Date of Birth/Sex: Treating RN: 1950/12/05 (69 y.o. [name] Primary Care Rion Catala: PATIENT, NO Other Clinician: Referring Kiira Brach: Treating Jayne Peckenpaugh/Extender:Robson, [name], Designated Weeks in Treatment: 33 Active Problems Location of Pain Severity and Description of Pain Patient Has Paino Yes Site Locations Rate the pain. Current Pain Level: 5 Pain Management and Medication Current Pain Management: Electronic Signature(s) Signed: 07/15/2019 5:57:11 PM By: [name] Signed: 09/15/2019 9:23:26 AM By: [name] Entered By: [name] on 07/15/2019 13:26:32 -------------------------------------------------------------------------------- Patient/Caregiver Education Details Patient Name: Date of Service: [name], [name] 2/11/2021andnbsp12:45 PM Medical Record [id number] Patient Account Number: [id number] Date of Birth/Gender: Treating RN: 1950/10/06 (69 y.o. [name] Primary Care Physician: PATIENT, NO Other Clinician: Referring Physician: Treating Physician/Extender:Robson, [name], Designated Weeks in Treatment: 35 Education Assessment Education Provided To: Patient Education Topics Provided Venous: Handouts: Managing Venous Disease and Related Ulcers Methods: Explain/Verbal Responses: Reinforcements needed Electronic Signature(s) Signed: 07/15/2019 5:57:11 PM By: [name] Entered By: [name] on 07/15/2019 13:15:57 -------------------------------------------------------------------------------- Wound Assessment Details Patient Name: Date of Service: [name], [name] 07/15/2019 12:45 PM Medical Record [id number] Patient Account Number: [id number] Date of Birth/Sex: Treating RN: 01-23-1951 (69 y.o. [name] Primary Care Marcelino Campos: PATIENT, NO Other Clinician: Referring  Aemon Koeller: Treating Yeimi Debnam/Extender:Robson, [name], Designated Weeks in Treatment: 33 Wound Status Wound Number: 4 Primary Venous Leg Ulcer Etiology: Wound Location: Left Malleolus - Lateral Wound Open Wounding Event: Trauma Status: Date  Acquired: 11/02/2018 Comorbid Arrhythmia, Coronary Artery Disease, Weeks Of Treatment: 33 History: Type II Diabetes Clustered Wound: No Photos Wound Measurements Length: (cm) 0.7 % Reduct Width: (cm) 0.8 % Reduct Depth: (cm) 0.6 Epitheli Area: (cm) 0.44 Tunneli Volume: (cm) 0.264 Undermi Wound Description Classification: Full Thickness Without Exposed Support Foul Od Structures Slough/ Wound Well defined, not attached Margin: Exudate Medium Amount: Exudate Serosanguineous Type: Exudate red, brown Color: Wound Bed Granulation Amount: Large (67-100%) Granulation Quality: Red Fascia E Necrotic Amount: Small (1-33%) Fat Laye Necrotic Quality: Adherent Slough Tendon E Muscle E Joint Ex Bone Exp or After Cleansing: No Fibrino Yes Exposed Structure xposed: No r (Subcutaneous Tissue) Exposed: Yes xposed: No xposed: No posed: No osed: No ion in Area: 66% ion in Volume: -103.1% alization: Small (1-33%) ng: No ning: No Assessment Notes maceration periwound. Electronic Signature(s) Signed: 07/16/2019 4:39:06 PM By: Mikeal Hawthorne EMT/HBOT Signed: 07/16/2019 5:41:48 PM By: [name] Previous Signature: 07/15/2019 5:57:11 PM Version By: [name] Entered By: Mikeal Hawthorne on 07/16/2019 14:49:15 -------------------------------------------------------------------------------- Vitals Details Patient Name: Date of Service: [name], [name] 07/15/2019 12:45 PM Medical Record [id number] Patient Account Number: [id number] Date of Birth/Sex: Treating RN: Oct 25, 1950 (69 y.o. [name] Primary Care Estefan Pattison: PATIENT, NO Other Clinician: Referring Aleaya Latona: Treating Billi Bright/Extender:Robson, [name],  Designated Weeks in Treatment: 33 Vital Signs Time Taken: 13:26 Temperature (F): 98.1 Height (in): 73 Pulse (bpm): 79 Weight (lbs): 205 Respiratory Rate (breaths/min): 18 Body Mass Index (BMI): 27 Blood Pressure (mmHg): 166/85 Reference Range: 80 - 120 mg / dl Electronic Signature(s) Signed: 09/15/2019 9:23:26 AM By: [name] Entered By: [name] on 07/15/2019 13:26:18

## 2019-09-21 ENCOUNTER — Other Ambulatory Visit: Payer: Self-pay

## 2019-09-21 ENCOUNTER — Ambulatory Visit (INDEPENDENT_AMBULATORY_CARE_PROVIDER_SITE_OTHER): Payer: Medicare Other | Admitting: Orthopedic Surgery

## 2019-09-21 ENCOUNTER — Encounter: Payer: Self-pay | Admitting: Orthopedic Surgery

## 2019-09-21 VITALS — Ht 72.0 in | Wt 200.0 lb

## 2019-09-21 DIAGNOSIS — M170 Bilateral primary osteoarthritis of knee: Secondary | ICD-10-CM

## 2019-09-21 DIAGNOSIS — L97929 Non-pressure chronic ulcer of unspecified part of left lower leg with unspecified severity: Secondary | ICD-10-CM

## 2019-09-21 DIAGNOSIS — I87332 Chronic venous hypertension (idiopathic) with ulcer and inflammation of left lower extremity: Secondary | ICD-10-CM | POA: Diagnosis not present

## 2019-09-21 MED ORDER — HYLAN G-F 20 48 MG/6ML IX SOSY
48.0000 mg | PREFILLED_SYRINGE | INTRA_ARTICULAR | Status: AC | PRN
  Administered 2019-09-21: 48 mg via INTRA_ARTICULAR

## 2019-09-21 MED ORDER — METHYLPREDNISOLONE ACETATE 40 MG/ML IJ SUSP
40.0000 mg | INTRAMUSCULAR | Status: AC | PRN
  Administered 2019-09-21: 17:00:00 40 mg via INTRA_ARTICULAR

## 2019-09-21 MED ORDER — LIDOCAINE HCL (PF) 1 % IJ SOLN
5.0000 mL | INTRAMUSCULAR | Status: AC | PRN
  Administered 2019-09-21: 17:00:00 5 mL

## 2019-09-21 MED ORDER — LIDOCAINE HCL 1 % IJ SOLN
1.0000 mL | INTRAMUSCULAR | Status: AC | PRN
  Administered 2019-09-21: 17:00:00 1 mL

## 2019-09-21 NOTE — Progress Notes (Signed)
Office Visit Note   Patient: [name]           Date of Birth: Dec 27, 1950           MRN: [id number] Visit Date: 09/21/2019              Requested by: No referring provider defined for this encounter. PCP: Patient, No Pcp Per  Chief Complaint  Patient presents with  . Right Knee - Follow-up    Buy and bill right knee synvisc one       HPI: Patient is a 69 year old gentleman who presents for 2 separate issues #1 presents for Synvisc 1 injection for osteoarthritis of right knee #2 follow-up for venous ulcer lateral malleolus left leg currently wearing compression stocking and using a nitroglycerin patch daily.  Assessment & Plan: Visit Diagnoses:  1. Bilateral primary osteoarthritis of knee   2. Chronic venous hypertension (idiopathic) with ulcer and inflammation of left lower extremity (HCC)     Plan: The right knee was injected he tolerated this well the lateral malleolar ulcer was debrided sock reapplied follow-up in 2 weeks to reevaluate the venous ulcer.  Follow-Up Instructions: Return in about 2 weeks (around 10/05/2019).   Ortho Exam  Patient is alert, oriented, no adenopathy, well-dressed, normal affect, normal respiratory effort. Examination patient has brawny skin color changes in the left leg dry cracked peeling skin there is callus around the ulcer which was debrided the wound bed is 100% granulation tissue 10 mm in diameter 1 mm deep no cellulitis no drainage no odor no signs of infection.  Imaging: No results found. No images are attached to the encounter.  Labs: Lab Results  Component Value Date   HGBA1C (H) 09/19/2010    6.4 (NOTE)                                                                       According to the ADA Clinical Practice Recommendations for 2011, when HbA1c is used as a screening test:   >=6.5%   Diagnostic of Diabetes Mellitus           (if abnormal result  is confirmed)  5.7-6.4%   Increased risk of developing Diabetes Mellitus   References:Diagnosis and Classification of Diabetes Mellitus,Diabetes [id number] 1):S62-S69 and Standards of Medical Care in         Diabetes - 2011,Diabetes Care,2011,34  (Suppl 1):S11-S61.   HGBA1C (H) 10/30/2009    7.0 (NOTE)                                                                       According to the ADA Clinical Practice Recommendations for 2011, when HbA1c is used as a screening test:   >=6.5%   Diagnostic of Diabetes Mellitus           (if abnormal result  is confirmed)  5.7-6.4%   Increased risk of developing Diabetes Mellitus  References:Diagnosis and Classification of Diabetes Mellitus,Diabetes [id number] 1):S62-S69 and Standards of Medical  Care in         Diabetes - 2011,Diabetes Care,2011,34  (Suppl 1):S11-S61.   HGBA1C  09/29/2007    5.7 (NOTE)   The ADA recommends the following therapeutic goals for glycemic   control related to Hgb A1C measurement:   Goal of Therapy:   < 7.0% Hgb A1C   Action Suggested:  > 8.0% Hgb A1C   Ref:  Diabetes Care, 22, Suppl. 1, 1999   ESRSEDRATE 17 (H) 10/30/2009   CRP 4.3 (H) 10/30/2009   REPTSTATUS 12/19/2018 FINAL 12/17/2018   GRAMSTAIN NO WBC SEEN NO ORGANISMS SEEN  12/17/2018   CULT  12/17/2018    FEW NORMAL SKIN FLORA Performed at [facility], [place] [address] [place], [place] [address]    LABORGA METHICILLIN RESISTANT STAPHYLOCOCCUS AUREUS 10/17/2007     Lab Results  Component Value Date   ALBUMIN 4.4 08/31/2015   ALBUMIN 4.1 01/27/2014   ALBUMIN 4.3 11/01/2013    No results found for: MG No results found for: VD25OH  No results found for: PREALBUMIN CBC EXTENDED Latest Ref Rng & Units 01/29/2019 08/31/2015 11/01/2013  WBC 4.0 - 10.5 K/uL - 5.5 6.9  RBC 4.22 - 5.81 MIL/uL - 4.85 5.10  HGB 13.0 - 17.0 g/dL 15.6 15.3 16.2  HCT 39.0 - 52.0 % 46.0 46.1 47.9  PLT 150 - 400 K/uL - 180 298  NEUTROABS 1.7 - 7.7 K/uL - - 5.0  LYMPHSABS 0.7 - 4.0 K/uL - - 1.0     Body mass index is 27.12 kg/m.   Orders:  No orders of the defined types were placed in this encounter.  No orders of the defined types were placed in this encounter.    Procedures: Large Joint Inj: R knee on 09/21/2019 5:01 PM Indications: pain and diagnostic evaluation Details: 22 G 1.5 in needle, anteromedial approach  Arthrogram: No  Medications: 5 mL lidocaine (PF) 1 %; 40 mg methylPREDNISolone acetate 40 MG/ML; 1 mL lidocaine 1 %; 48 mg Hylan 48 MG/6ML Outcome: tolerated well, no immediate complications Procedure, treatment alternatives, risks and benefits explained, specific risks discussed. Consent was given by the patient. Immediately prior to procedure a time out was called to verify the correct patient, procedure, equipment, support staff and site/side marked as required. Patient was prepped and draped in the usual sterile fashion.      Clinical Data: No additional findings.  ROS:  All other systems negative, except as noted in the HPI. Review of Systems  Objective: Vital Signs: Ht 6' (1.829 m)   Wt 200 lb (90.7 kg)   BMI 27.12 kg/m   Specialty Comments:  No specialty comments available.  PMFS History: Patient Active Problem List   Diagnosis Date Noted  . Bilateral primary osteoarthritis of knee 09/07/2019  . Hx of adenomatous colonic polyps 07/25/2015  . Chest pain 01/05/2014  . Coronary artery disease 02/28/2011  . Glucose intolerance (pre-diabetes) 02/28/2011  . ALCOHOL ABUSE 11/21/2009  . WOUND, FOOT 02/11/2009  . VENOUS INSUFFICIENCY, CHRONIC, RIGHT LEG 06/07/2008  . HYPERLIPIDEMIA 03/11/2007  . HYPERTENSION 03/11/2007  . LOW BACK PAIN 03/11/2007   Past Medical History:  Diagnosis Date  . Alcohol abuse   . CAD (coronary artery disease)    a. s/p CABG 2009 (Dr. Roxy Manns - LIMA-OM2, L radial - PDA, SVG-Dx) // b. Nuc 8/15: No ischemia, EF 65, normal study // c. Nuc 3/17: No significant reversible ischemia. LVEF 64% with normal wall motion. This is a low risk study.   [place] Kitchen  Carotid  stenosis    Carotid US 3/19: Bilateral ICA 1-39  . DDD (degenerative disc disease)   . Gout   . HLD (hyperlipidemia)   . Hx of adenomatous colonic polyps 07/25/2015  . Hyperglycemia   . Hypertension   . Lumbago    (low back pain)   . Neuropathy   . PAF (paroxysmal atrial fibrillation) ([place])    Anticoag (previously on Xarelto) stopped in 2017 due to concerns over bleeding risk in the setting of heavy alcohol use)  . Paroxysmal atrial flutter ([place])    TEE 6/15: EF 50-55, no RWMA, mild MR, LAE, no LAA clot >> s/p DCCV to restore NSR  . S/P CABG x 1   . Sciatica     Family History  Problem Relation Age of Onset  . Coronary artery disease Unknown        family history  . Heart disease Father        CABG   . Heart attack Father   . Stroke Unknown        family history  . Colon cancer Neg Hx   . Colon polyps Neg Hx   . Esophageal cancer Neg Hx   . Rectal cancer Neg Hx   . Stomach cancer Neg Hx     Past Surgical History:  Procedure Laterality Date  . ABDOMINAL AORTOGRAM W/LOWER EXTREMITY Bilateral 01/29/2019   Procedure: ABDOMINAL AORTOGRAM W/LOWER EXTREMITY;  Surgeon: Angelia Mould, MD;  Location: [facility];  Service: Cardiovascular;  Laterality: Bilateral;  . CARDIAC CATHETERIZATION  4.17.09  . CARDIOVERSION N/A 11/03/2013   Procedure: CARDIOVERSION;  Surgeon: Dorothy Spark, MD;  Location: [facility];  Service: Cardiovascular;  Laterality: N/A;  . COLONOSCOPY  10-01-2002   gessner-tics and hems   . CORONARY ARTERY BYPASS GRAFT  5.8.09   x3  . INGUINAL HERNIA REPAIR Left   . LAPAROSCOPIC INGUINAL HERNIA REPAIR Right   . TEE WITHOUT CARDIOVERSION N/A 11/03/2013   Procedure: TRANSESOPHAGEAL ECHOCARDIOGRAM (TEE);  Surgeon: Dorothy Spark, MD;  Location: [facility];  Service: Cardiovascular;  Laterality: N/A;  . TONSILLECTOMY AND ADENOIDECTOMY    . WRIST SURGERY     Social History   Occupational History  . Not on file  Tobacco Use  . Smoking status:  Never Smoker  . Smokeless tobacco: Never Used  Substance and Sexual Activity  . Alcohol use: Yes    Alcohol/week: 0.0 standard drinks    Comment: 6 beers per week  . Drug use: No  . Sexual activity: Not on file

## 2019-10-05 ENCOUNTER — Ambulatory Visit: Payer: Medicare Other | Admitting: Orthopedic Surgery

## 2019-10-11 ENCOUNTER — Encounter: Payer: Self-pay | Admitting: General Practice

## 2019-10-11 ENCOUNTER — Ambulatory Visit: Payer: Medicare Other | Admitting: Orthopedic Surgery

## 2019-10-14 ENCOUNTER — Encounter: Payer: Self-pay | Admitting: Orthopedic Surgery

## 2019-10-14 ENCOUNTER — Other Ambulatory Visit: Payer: Self-pay

## 2019-10-14 ENCOUNTER — Ambulatory Visit (INDEPENDENT_AMBULATORY_CARE_PROVIDER_SITE_OTHER): Payer: Medicare Other | Admitting: Orthopedic Surgery

## 2019-10-14 VITALS — Ht 72.0 in | Wt 200.0 lb

## 2019-10-14 DIAGNOSIS — L97929 Non-pressure chronic ulcer of unspecified part of left lower leg with unspecified severity: Secondary | ICD-10-CM

## 2019-10-14 DIAGNOSIS — I87332 Chronic venous hypertension (idiopathic) with ulcer and inflammation of left lower extremity: Secondary | ICD-10-CM

## 2019-10-18 ENCOUNTER — Encounter: Payer: Self-pay | Admitting: Orthopedic Surgery

## 2019-10-18 NOTE — Progress Notes (Signed)
Office Visit Note   Patient: [name]           Date of Birth: 1950-11-05           MRN: [id number] Visit Date: 10/14/2019              Requested by: No referring provider defined for this encounter. PCP: Patient, No Pcp Per  Chief Complaint  Patient presents with  . Left Ankle - Wound Check      HPI: Patient is a 69 year old gentleman with chronic ulceration lateral malleolus left ankle.  Patient has been using a nitroglycerin patch to improve microcirculation Trental all 3 times a day and compression stocking.  Assessment & Plan: Visit Diagnoses:  1. Chronic venous hypertension (idiopathic) with ulcer and inflammation of left lower extremity (HCC)     Plan: Recommended exercise to stimulate the calf pump to improve the circulation continue with all other conservative treatments with washing with soap and water wearing the compression sock using Trental   Follow-Up Instructions: Return in about 3 weeks (around 11/04/2019).   Ortho Exam  Patient is alert, oriented, no adenopathy, well-dressed, normal affect, normal respiratory effort. Examination the ulcer over the lateral malleolus is 10 x 15 mm and 1 mm deep the callus and scab removed there is good healthy granulation tissue at the base this does not extend down to bone or tendon.  He does have venous insufficiency with brawny skin color changes and swelling.  Imaging: No results found. No images are attached to the encounter.  Labs: Lab Results  Component Value Date   HGBA1C (H) 09/19/2010    6.4 (NOTE)                                                                       According to the ADA Clinical Practice Recommendations for 2011, when HbA1c is used as a screening test:   >=6.5%   Diagnostic of Diabetes Mellitus           (if abnormal result  is confirmed)  5.7-6.4%   Increased risk of developing Diabetes Mellitus  References:Diagnosis and Classification of Diabetes Mellitus,Diabetes [id number]  1):S62-S69 and Standards of Medical Care in         Diabetes - 2011,Diabetes Care,2011,34  (Suppl 1):S11-S61.   HGBA1C (H) 10/30/2009    7.0 (NOTE)                                                                       According to the ADA Clinical Practice Recommendations for 2011, when HbA1c is used as a screening test:   >=6.5%   Diagnostic of Diabetes Mellitus           (if abnormal result  is confirmed)  5.7-6.4%   Increased risk of developing Diabetes Mellitus  References:Diagnosis and Classification of Diabetes Mellitus,Diabetes [id number] 1):S62-S69 and Standards of Medical Care in         Diabetes - 2011,Diabetes [id number]  (Suppl 1):S11-S61.  HGBA1C  09/29/2007    5.7 (NOTE)   The ADA recommends the following therapeutic goals for glycemic   control related to Hgb A1C measurement:   Goal of Therapy:   < 7.0% Hgb A1C   Action Suggested:  > 8.0% Hgb A1C   Ref:  Diabetes Care, 22, Suppl. 1, 1999   ESRSEDRATE 17 (H) 10/30/2009   CRP 4.3 (H) 10/30/2009   REPTSTATUS 12/19/2018 FINAL 12/17/2018   GRAMSTAIN NO WBC SEEN NO ORGANISMS SEEN  12/17/2018   CULT  12/17/2018    FEW NORMAL SKIN FLORA Performed at [facility], [place] [address], [place],  [address]    LABORGA METHICILLIN RESISTANT STAPHYLOCOCCUS AUREUS 10/17/2007     Lab Results  Component Value Date   ALBUMIN 4.4 08/31/2015   ALBUMIN 4.1 01/27/2014   ALBUMIN 4.3 11/01/2013    No results found for: MG No results found for: VD25OH  No results found for: PREALBUMIN CBC EXTENDED Latest Ref Rng & Units 01/29/2019 08/31/2015 11/01/2013  WBC 4.0 - 10.5 K/uL - 5.5 6.9  RBC 4.22 - 5.81 MIL/uL - 4.85 5.10  HGB 13.0 - 17.0 g/dL 15.6 15.3 16.2  HCT 39.0 - 52.0 % 46.0 46.1 47.9  PLT 150 - 400 K/uL - 180 298  NEUTROABS 1.7 - 7.7 K/uL - - 5.0  LYMPHSABS 0.7 - 4.0 K/uL - - 1.0     Body mass index is 27.12 kg/m.  Orders:  No orders of the defined types were placed in this encounter.  No orders of the  defined types were placed in this encounter.    Procedures: No procedures performed  Clinical Data: No additional findings.  ROS:  All other systems negative, except as noted in the HPI. Review of Systems  Objective: Vital Signs: Ht 6' (1.829 m)   Wt 200 lb (90.7 kg)   BMI 27.12 kg/m   Specialty Comments:  No specialty comments available.  PMFS History: Patient Active Problem List   Diagnosis Date Noted  . Bilateral primary osteoarthritis of knee 09/07/2019  . Hx of adenomatous colonic polyps 07/25/2015  . Chest pain 01/05/2014  . Coronary artery disease 02/28/2011  . Glucose intolerance (pre-diabetes) 02/28/2011  . ALCOHOL ABUSE 11/21/2009  . WOUND, FOOT 02/11/2009  . VENOUS INSUFFICIENCY, CHRONIC, RIGHT LEG 06/07/2008  . HYPERLIPIDEMIA 03/11/2007  . HYPERTENSION 03/11/2007  . LOW BACK PAIN 03/11/2007   Past Medical History:  Diagnosis Date  . Alcohol abuse   . CAD (coronary artery disease)    a. s/p CABG 2009 (Dr. Roxy Manns - LIMA-OM2, L radial - PDA, SVG-Dx) // b. Nuc 8/15: No ischemia, EF 65, normal study // c. Nuc 3/17: No significant reversible ischemia. LVEF 64% with normal wall motion. This is a low risk study.   . Carotid stenosis    Carotid US 3/19: Bilateral ICA 1-39  . DDD (degenerative disc disease)   . Gout   . HLD (hyperlipidemia)   . Hx of adenomatous colonic polyps 07/25/2015  . Hyperglycemia   . Hypertension   . Lumbago    (low back pain)   . Neuropathy   . PAF (paroxysmal atrial fibrillation) ([place])    Anticoag (previously on Xarelto) stopped in 2017 due to concerns over bleeding risk in the setting of heavy alcohol use)  . Paroxysmal atrial flutter ([place])    TEE 6/15: EF 50-55, no RWMA, mild MR, LAE, no LAA clot >> s/p DCCV to restore NSR  . S/P CABG x 1   .  Sciatica     Family History  Problem Relation Age of Onset  . Coronary artery disease Unknown        family history  . Heart disease Father        CABG   . Heart attack Father   .  Stroke Unknown        family history  . Colon cancer Neg Hx   . Colon polyps Neg Hx   . Esophageal cancer Neg Hx   . Rectal cancer Neg Hx   . Stomach cancer Neg Hx     Past Surgical History:  Procedure Laterality Date  . ABDOMINAL AORTOGRAM W/LOWER EXTREMITY Bilateral 01/29/2019   Procedure: ABDOMINAL AORTOGRAM W/LOWER EXTREMITY;  Surgeon: Angelia Mould, MD;  Location: [facility];  Service: Cardiovascular;  Laterality: Bilateral;  . CARDIAC CATHETERIZATION  4.17.09  . CARDIOVERSION N/A 11/03/2013   Procedure: CARDIOVERSION;  Surgeon: Dorothy Spark, MD;  Location: [facility];  Service: Cardiovascular;  Laterality: N/A;  . COLONOSCOPY  10-01-2002   gessner-tics and hems   . CORONARY ARTERY BYPASS GRAFT  5.8.09   x3  . INGUINAL HERNIA REPAIR Left   . LAPAROSCOPIC INGUINAL HERNIA REPAIR Right   . TEE WITHOUT CARDIOVERSION N/A 11/03/2013   Procedure: TRANSESOPHAGEAL ECHOCARDIOGRAM (TEE);  Surgeon: Dorothy Spark, MD;  Location: [facility];  Service: Cardiovascular;  Laterality: N/A;  . TONSILLECTOMY AND ADENOIDECTOMY    . WRIST SURGERY     Social History   Occupational History  . Not on file  Tobacco Use  . Smoking status: Never Smoker  . Smokeless tobacco: Never Used  Substance and Sexual Activity  . Alcohol use: Yes    Alcohol/week: 0.0 standard drinks    Comment: 6 beers per week  . Drug use: No  . Sexual activity: Not on file

## 2019-10-23 IMAGING — DX LEFT ANKLE COMPLETE - 3+ VIEW
3 series · 3 of 3 positions shown · non-contrast
Comparison: 10/31/2009.

CLINICAL DATA: Ankle ulcer.

EXAM:
LEFT ANKLE COMPLETE - 3+ VIEW

[ankle ap]
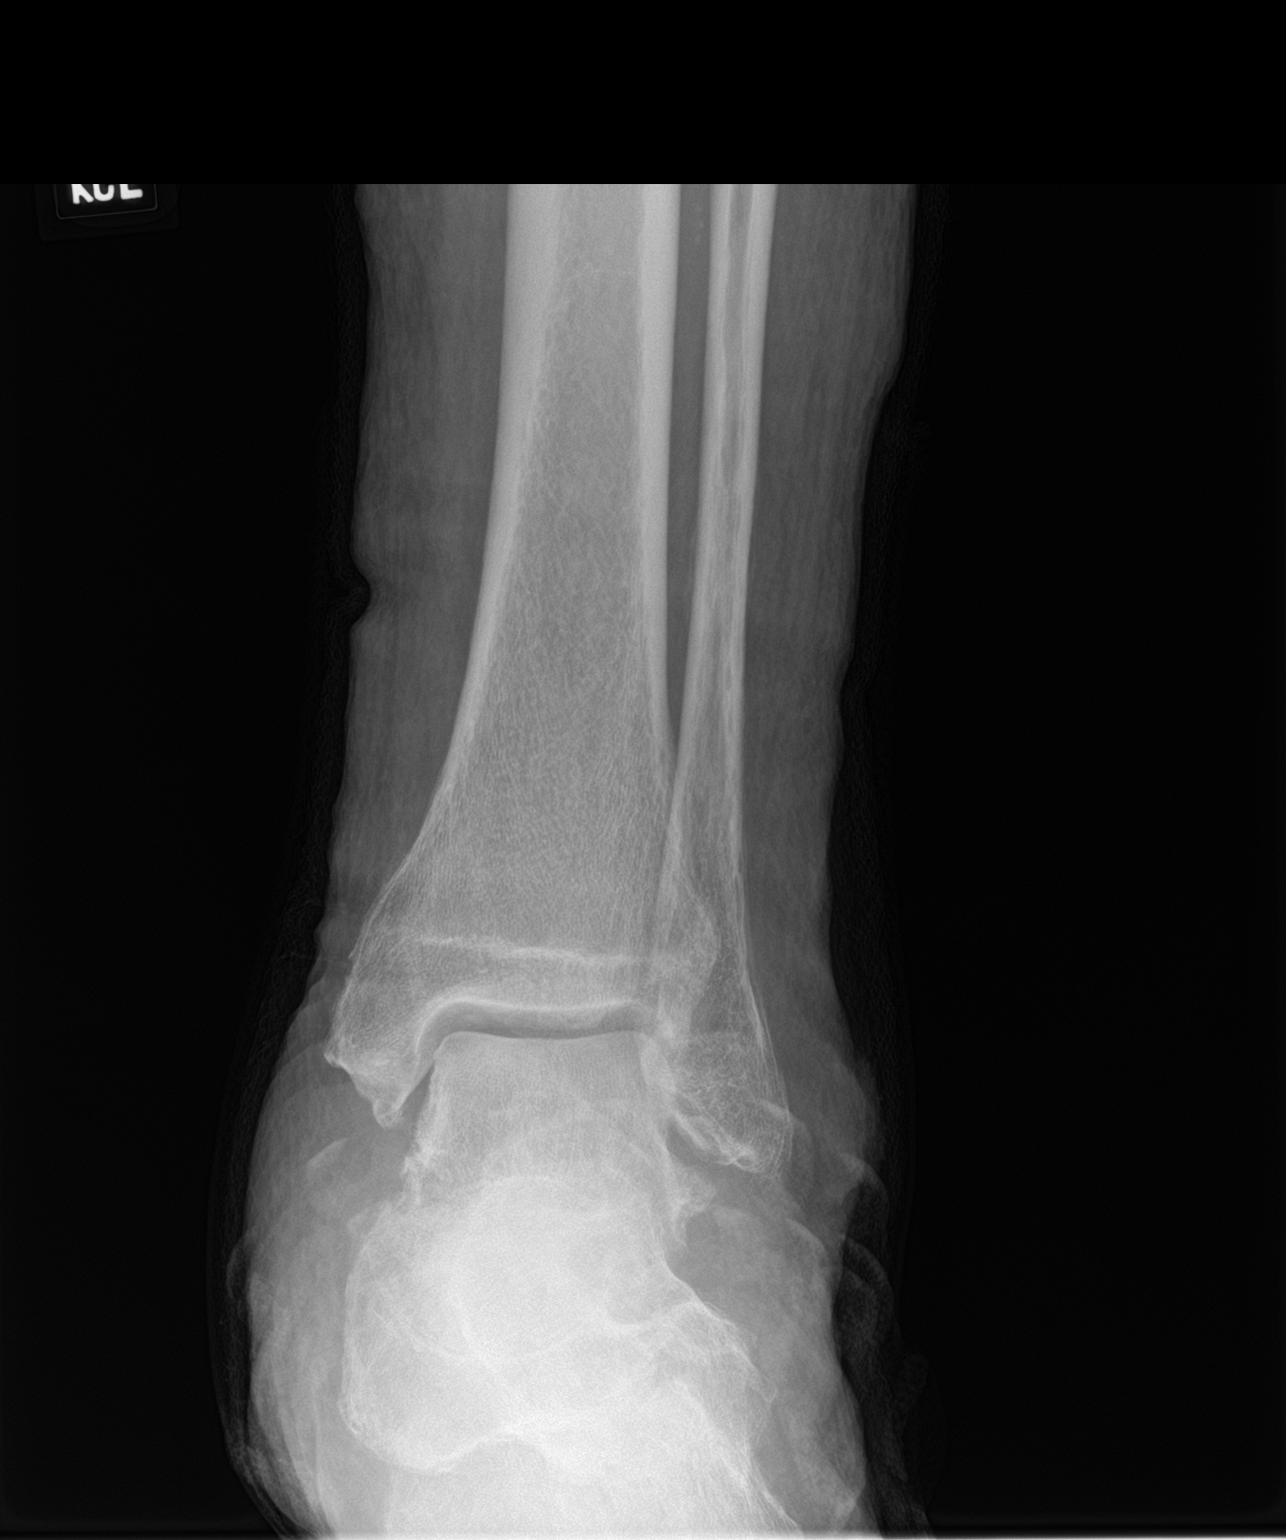

[ankle obl]
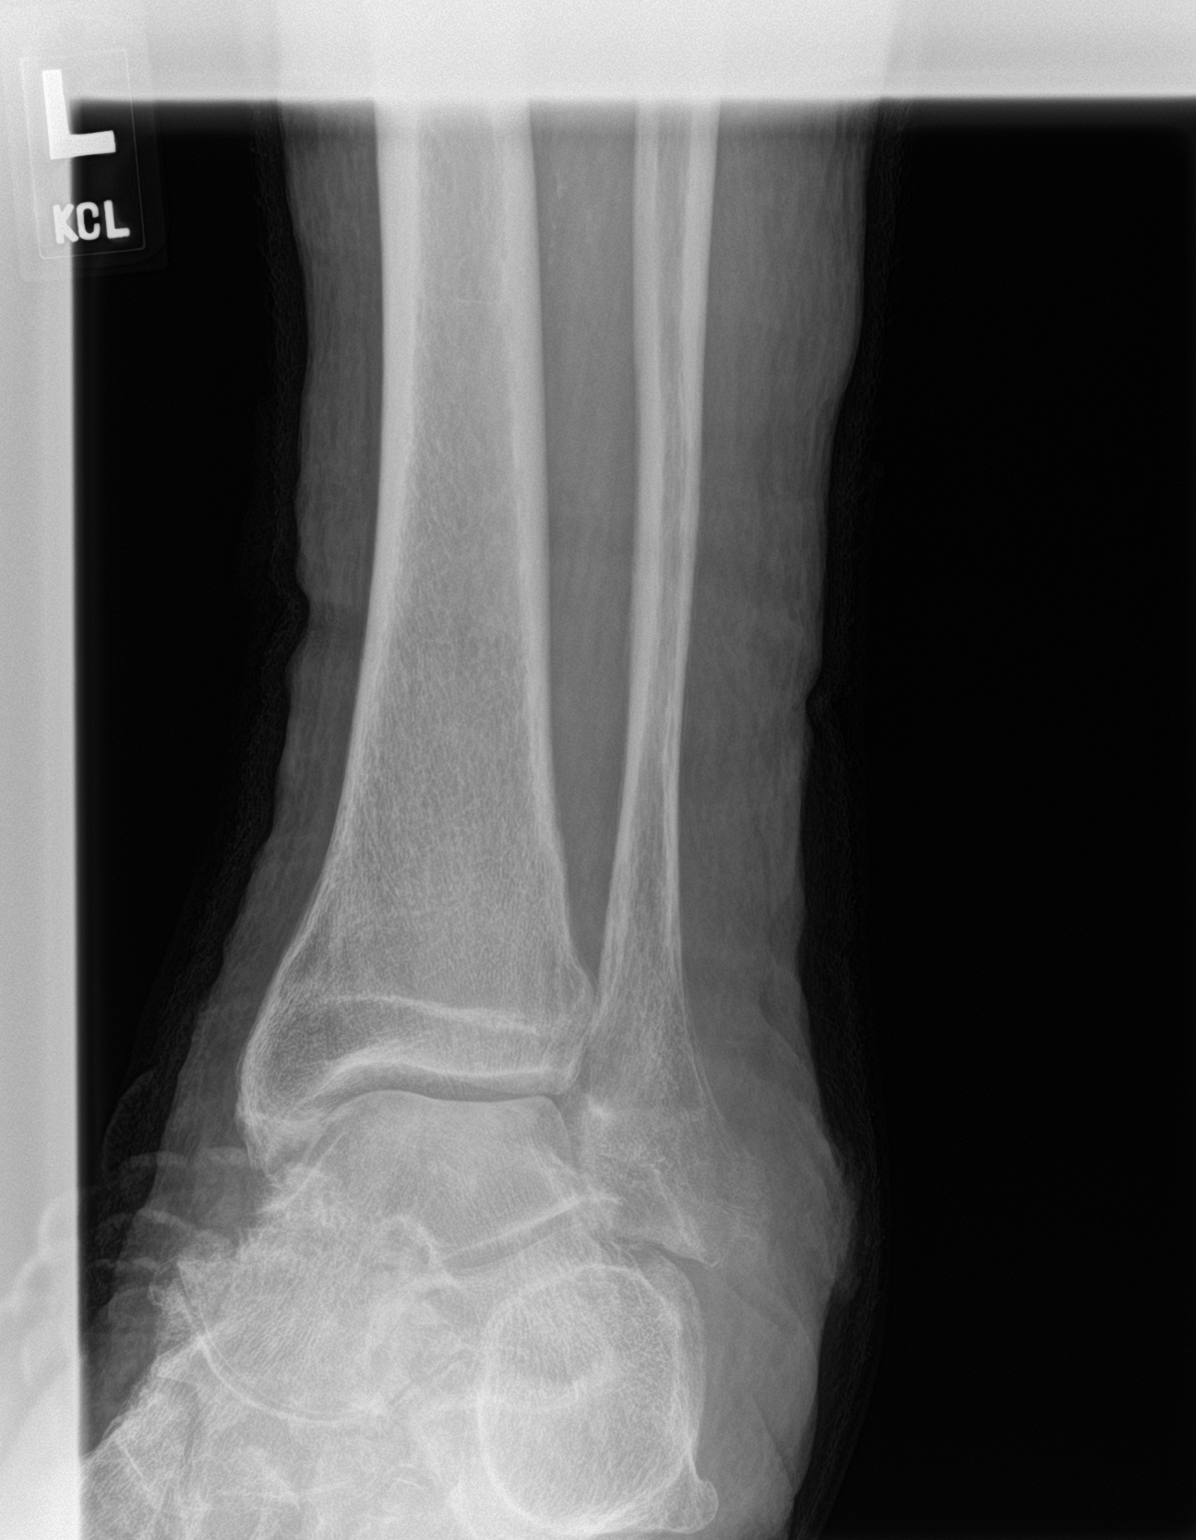

[ankle lat]
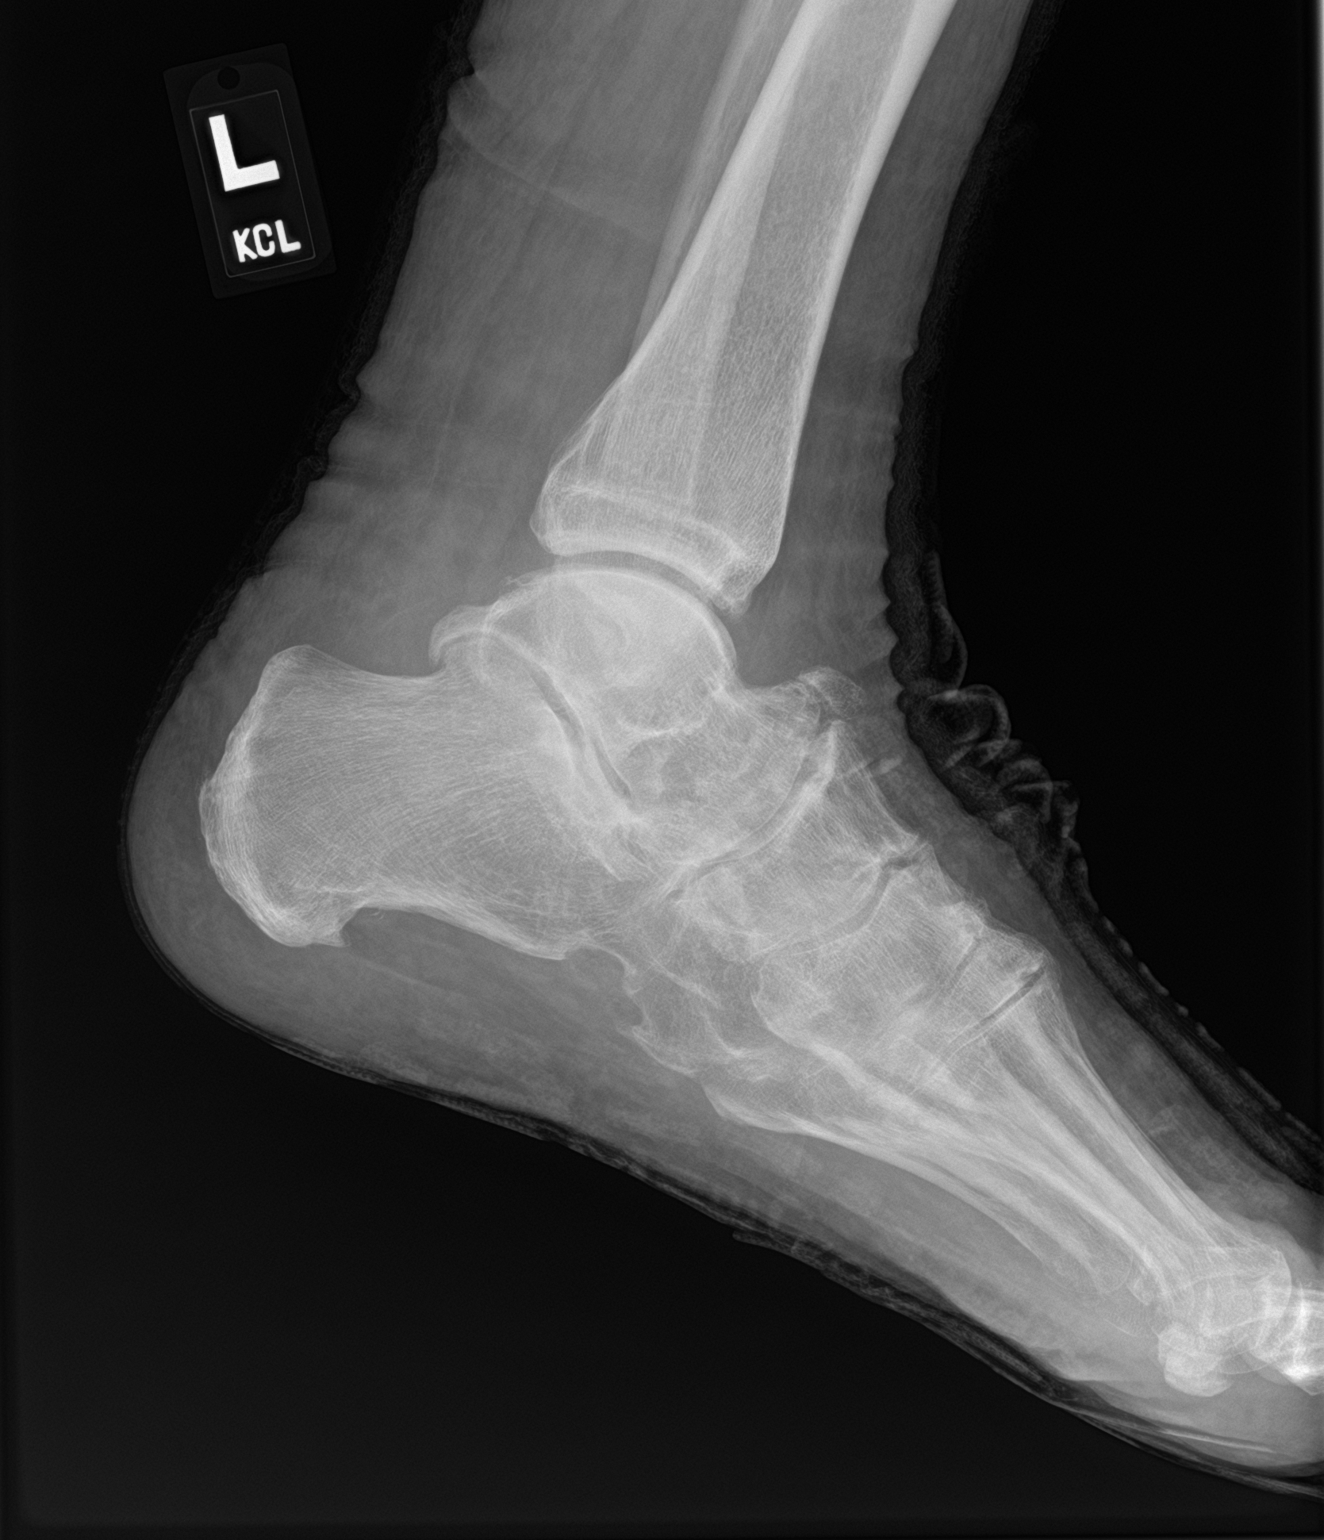

[3 of 3 positions shown; findings below may reference images not displayed]

FINDINGS: Diffuse soft tissue swelling again noted. No radiopaque foreign
body. Bandaging is noted. Diffuse degenerative change again noted.
No evidence of fracture or dislocation.
IMPRESSION: Diffuse soft tissue swelling again noted. Diffuse degenerative
change again noted. No acute abnormality identified.

## 2019-11-04 ENCOUNTER — Ambulatory Visit (INDEPENDENT_AMBULATORY_CARE_PROVIDER_SITE_OTHER): Payer: Medicare Other | Admitting: Orthopedic Surgery

## 2019-11-04 ENCOUNTER — Encounter: Payer: Self-pay | Admitting: Orthopedic Surgery

## 2019-11-04 ENCOUNTER — Other Ambulatory Visit: Payer: Self-pay

## 2019-11-04 DIAGNOSIS — I87332 Chronic venous hypertension (idiopathic) with ulcer and inflammation of left lower extremity: Secondary | ICD-10-CM

## 2019-11-04 DIAGNOSIS — L97929 Non-pressure chronic ulcer of unspecified part of left lower leg with unspecified severity: Secondary | ICD-10-CM

## 2019-11-08 ENCOUNTER — Encounter: Payer: Self-pay | Admitting: Orthopedic Surgery

## 2019-11-08 NOTE — Progress Notes (Signed)
Office Visit Note   Patient: [name]           Date of Birth: 1950-10-06           MRN: [id number] Visit Date: 11/04/2019              Requested by: No referring provider defined for this encounter. PCP: Patient, No Pcp Per  Chief Complaint  Patient presents with  . Left Ankle - Pain      HPI: Patient is a 69 year old gentleman who presents in follow-up for venous insufficiency ulceration left lower extremity.  Patient states he has increased swelling due to a recent drive to [place] and back.  He has been using the Trental and nitroglycerin patch.  Assessment & Plan: Visit Diagnoses:  1. Chronic venous hypertension (idiopathic) with ulcer and inflammation of left lower extremity (HCC)     Plan: Recommended decreased sitting with his legs dependent elevate his legs work on the calf muscle and for the pump.  Recommended continue with the compression sock and continue with the Trental.  Follow-Up Instructions: Return in about 2 weeks (around 11/18/2019).   Ortho Exam  Patient is alert, oriented, no adenopathy, well-dressed, normal affect, normal respiratory effort. Examination patient has weeping edema from his leg there is increased venous swelling.  There is no cellulitis no signs of infection.  Imaging: No results found. No images are attached to the encounter.  Labs: Lab Results  Component Value Date   HGBA1C (H) 09/19/2010    6.4 (NOTE)                                                                       According to the ADA Clinical Practice Recommendations for 2011, when HbA1c is used as a screening test:   >=6.5%   Diagnostic of Diabetes Mellitus           (if abnormal result  is confirmed)  5.7-6.4%   Increased risk of developing Diabetes Mellitus  References:Diagnosis and Classification of Diabetes Mellitus,Diabetes NKNL,9767,34(LPFXT 1):S62-S69 and Standards of Medical Care in         Diabetes - 2011,Diabetes Care,2011,34  (Suppl 1):S11-S61.   HGBA1C (H) 10/30/2009    7.0 (NOTE)                                                                       According to the ADA Clinical Practice Recommendations for 2011, when HbA1c is used as a screening test:   >=6.5%   Diagnostic of Diabetes Mellitus           (if abnormal result  is confirmed)  5.7-6.4%   Increased risk of developing Diabetes Mellitus  References:Diagnosis and Classification of Diabetes Mellitus,Diabetes KWIO,9735,32(DJMEQ 1):S62-S69 and Standards of Medical Care in         Diabetes - 2011,Diabetes ASTM,1962,22  (Suppl 1):S11-S61.   HGBA1C  09/29/2007    5.7 (NOTE)   The ADA recommends the following therapeutic goals for glycemic  control related to Hgb A1C measurement:   Goal of Therapy:   < 7.0% Hgb A1C   Action Suggested:  > 8.0% Hgb A1C   Ref:  Diabetes Care, 22, Suppl. 1, 1999   ESRSEDRATE 17 (H) 10/30/2009   CRP 4.3 (H) 10/30/2009   REPTSTATUS 12/19/2018 FINAL 12/17/2018   GRAMSTAIN NO WBC SEEN NO ORGANISMS SEEN  12/17/2018   CULT  12/17/2018    FEW NORMAL SKIN FLORA Performed at [facility], [place] [address], [place],  [address]    LABORGA METHICILLIN RESISTANT STAPHYLOCOCCUS AUREUS 10/17/2007     Lab Results  Component Value Date   ALBUMIN 4.4 08/31/2015   ALBUMIN 4.1 01/27/2014   ALBUMIN 4.3 11/01/2013    No results found for: MG No results found for: VD25OH  No results found for: PREALBUMIN CBC EXTENDED Latest Ref Rng & Units 01/29/2019 08/31/2015 11/01/2013  WBC 4.0 - 10.5 K/uL - 5.5 6.9  RBC 4.22 - 5.81 MIL/uL - 4.85 5.10  HGB 13.0 - 17.0 g/dL 15.6 15.3 16.2  HCT 39.0 - 52.0 % 46.0 46.1 47.9  PLT 150 - 400 K/uL - 180 298  NEUTROABS 1.7 - 7.7 K/uL - - 5.0  LYMPHSABS 0.7 - 4.0 K/uL - - 1.0     There is no height or weight on file to calculate BMI.  Orders:  No orders of the defined types were placed in this encounter.  No orders of the defined types were placed in this encounter.    Procedures: No procedures  performed  Clinical Data: No additional findings.  ROS:  All other systems negative, except as noted in the HPI. Review of Systems  Objective: Vital Signs: There were no vitals taken for this visit.  Specialty Comments:  No specialty comments available.  PMFS History: Patient Active Problem List   Diagnosis Date Noted  . Bilateral primary osteoarthritis of knee 09/07/2019  . Hx of adenomatous colonic polyps 07/25/2015  . Chest pain 01/05/2014  . Coronary artery disease 02/28/2011  . Glucose intolerance (pre-diabetes) 02/28/2011  . ALCOHOL ABUSE 11/21/2009  . WOUND, FOOT 02/11/2009  . VENOUS INSUFFICIENCY, CHRONIC, RIGHT LEG 06/07/2008  . HYPERLIPIDEMIA 03/11/2007  . HYPERTENSION 03/11/2007  . LOW BACK PAIN 03/11/2007   Past Medical History:  Diagnosis Date  . Alcohol abuse   . CAD (coronary artery disease)    a. s/p CABG 2009 (Dr. Roxy Manns - LIMA-OM2, L radial - PDA, SVG-Dx) // b. Nuc 8/15: No ischemia, EF 65, normal study // c. Nuc 3/17: No significant reversible ischemia. LVEF 64% with normal wall motion. This is a low risk study.   . Carotid stenosis    Carotid US 3/19: Bilateral ICA 1-39  . DDD (degenerative disc disease)   . Gout   . HLD (hyperlipidemia)   . Hx of adenomatous colonic polyps 07/25/2015  . Hyperglycemia   . Hypertension   . Lumbago    (low back pain)   . Neuropathy   . PAF (paroxysmal atrial fibrillation) ([place])    Anticoag (previously on Xarelto) stopped in 2017 due to concerns over bleeding risk in the setting of heavy alcohol use)  . Paroxysmal atrial flutter ([place])    TEE 6/15: EF 50-55, no RWMA, mild MR, LAE, no LAA clot >> s/p DCCV to restore NSR  . S/P CABG x 1   . Sciatica     Family History  Problem Relation Age of Onset  . Coronary artery disease Unknown  family history  . Heart disease Father        CABG   . Heart attack Father   . Stroke Unknown        family history  . Colon cancer Neg Hx   . Colon polyps Neg Hx   .  Esophageal cancer Neg Hx   . Rectal cancer Neg Hx   . Stomach cancer Neg Hx     Past Surgical History:  Procedure Laterality Date  . ABDOMINAL AORTOGRAM W/LOWER EXTREMITY Bilateral 01/29/2019   Procedure: ABDOMINAL AORTOGRAM W/LOWER EXTREMITY;  Surgeon: Angelia Mould, MD;  Location: [facility];  Service: Cardiovascular;  Laterality: Bilateral;  . CARDIAC CATHETERIZATION  4.17.09  . CARDIOVERSION N/A 11/03/2013   Procedure: CARDIOVERSION;  Surgeon: Dorothy Spark, MD;  Location: [facility] ENDOSCOPY;  Service: Cardiovascular;  Laterality: N/A;  . COLONOSCOPY  10-01-2002   gessner-tics and hems   . CORONARY ARTERY BYPASS GRAFT  5.8.09   x3  . INGUINAL HERNIA REPAIR Left   . LAPAROSCOPIC INGUINAL HERNIA REPAIR Right   . TEE WITHOUT CARDIOVERSION N/A 11/03/2013   Procedure: TRANSESOPHAGEAL ECHOCARDIOGRAM (TEE);  Surgeon: Dorothy Spark, MD;  Location: [facility];  Service: Cardiovascular;  Laterality: N/A;  . TONSILLECTOMY AND ADENOIDECTOMY    . WRIST SURGERY     Social History   Occupational History  . Not on file  Tobacco Use  . Smoking status: Never Smoker  . Smokeless tobacco: Never Used  Substance and Sexual Activity  . Alcohol use: Yes    Alcohol/week: 0.0 standard drinks    Comment: 6 beers per week  . Drug use: No  . Sexual activity: Not on file

## 2019-11-12 ENCOUNTER — Telehealth: Payer: Self-pay | Admitting: Orthopedic Surgery

## 2019-11-12 NOTE — Telephone Encounter (Signed)
Patient called.   He was seen by [facility] for a rash he developed across his body. He was prescribed an antibiotic called Mupirocin and wanted to make sure it was okay to take in combination with his medicine from [place].  Call back: [phone]

## 2019-11-15 NOTE — Telephone Encounter (Signed)
Pt is using Trental and nitro patch and that this is ok to use with the Bactroban and if he has any other questions to call the office with any additional questions.

## 2019-11-18 ENCOUNTER — Other Ambulatory Visit: Payer: Self-pay

## 2019-11-18 ENCOUNTER — Encounter: Payer: Self-pay | Admitting: Orthopedic Surgery

## 2019-11-18 ENCOUNTER — Ambulatory Visit (INDEPENDENT_AMBULATORY_CARE_PROVIDER_SITE_OTHER): Payer: Medicare Other | Admitting: Physician Assistant

## 2019-11-18 VITALS — Ht 72.0 in | Wt 200.0 lb

## 2019-11-18 DIAGNOSIS — L97929 Non-pressure chronic ulcer of unspecified part of left lower leg with unspecified severity: Secondary | ICD-10-CM | POA: Diagnosis not present

## 2019-11-18 DIAGNOSIS — I87332 Chronic venous hypertension (idiopathic) with ulcer and inflammation of left lower extremity: Secondary | ICD-10-CM | POA: Diagnosis not present

## 2019-11-18 NOTE — Progress Notes (Signed)
Office Visit Note   Patient: [name]           Date of Birth: 1950/11/03           MRN: [id number] Visit Date: 11/18/2019              Requested by: No referring provider defined for this encounter. PCP: Patient, No Pcp Per  Chief Complaint  Patient presents with  . 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  He also has chronic lower extremity venous insufficiency.  He has been using a nitroglycerin patch to help heal the ulcer as well as taking Trental.  He was last seen 2 weeks ago.  Following this visit he broke out in a rash all over his body.  This is being treated by dermatologist.  He does not feel he has made significant progress with the swelling in his left leg and now he has swelling in the right leg.  He is applying a cream to his lower legs but is unsure what it is.  He is also on Keflex for the rash.   Assessment & Plan: Visit Diagnoses: No diagnosis found.  Plan: Ideally I think he would do well with a Profore wrap on the left side for a week to help with some of the swelling.  However he states he is having significant itching in both of his legs related to his dermatitis.  And I feel he be unable to tolerate the wraps.  He will continue with the compression stockings for another week but I would like to recheck him at that time.  If he still has swelling I would recommend Profore wraps Emphasized the importance of doing ankle pumps and elevating his feet above or at heart level is much as he can Follow-Up Instructions: No follow-ups on file.   Ortho Exam  Patient is alert, oriented, no adenopathy, well-dressed, normal affect, normal respiratory effort. Bilateral lower extremities with venous stasis changes I think this is also superimposed on the rash she has had.  I am unsure what type of cream he is putting on the legs.  On the left lateral malleolus the ulcer has  improved.  Is 1 x 1 cm without surrounding cellulitis or fluctuance.  There is no drainage.  Nitroglycerin patch is in place in an appropriate area  Imaging: No results found. No images are attached to the encounter.  Labs: Lab Results  Component Value Date   HGBA1C (H) 09/19/2010    6.4 (NOTE)                                                                       According to the ADA Clinical Practice Recommendations for 2011, when HbA1c is used as a screening test:   >=6.5%   Diagnostic of Diabetes Mellitus           (if abnormal result  is confirmed)  5.7-6.4%   Increased risk of developing Diabetes Mellitus  References:Diagnosis and Classification of Diabetes Mellitus,Diabetes YWVP,7106,26(RSWNI 1):S62-S69 and Standards of Medical Care in         Diabetes - 2011,Diabetes [id number]  (  Suppl 1):S11-S61.   HGBA1C (H) 10/30/2009    7.0 (NOTE)                                                                       According to the ADA Clinical Practice Recommendations for 2011, when HbA1c is used as a screening test:   >=6.5%   Diagnostic of Diabetes Mellitus           (if abnormal result  is confirmed)  5.7-6.4%   Increased risk of developing Diabetes Mellitus  References:Diagnosis and Classification of Diabetes Mellitus,Diabetes GLOV,5643,32(RJJOA 1):S62-S69 and Standards of Medical Care in         Diabetes - 2011,Diabetes CZYS,0630,16  (Suppl 1):S11-S61.   HGBA1C  09/29/2007    5.7 (NOTE)   The ADA recommends the following therapeutic goals for glycemic   control related to Hgb A1C measurement:   Goal of Therapy:   < 7.0% Hgb A1C   Action Suggested:  > 8.0% Hgb A1C   Ref:  Diabetes Care, 22, Suppl. 1, 1999   ESRSEDRATE 17 (H) 10/30/2009   CRP 4.3 (H) 10/30/2009   REPTSTATUS 12/19/2018 FINAL 12/17/2018   GRAMSTAIN NO WBC SEEN NO ORGANISMS SEEN  12/17/2018   CULT  12/17/2018    FEW NORMAL SKIN FLORA Performed at [facility], [place] [address] , [place] [address]    LABORGA  METHICILLIN RESISTANT STAPHYLOCOCCUS AUREUS 10/17/2007     Lab Results  Component Value Date   ALBUMIN 4.4 08/31/2015   ALBUMIN 4.1 01/27/2014   ALBUMIN 4.3 11/01/2013    No results found for: MG No results found for: VD25OH  No results found for: PREALBUMIN CBC EXTENDED Latest Ref Rng & Units 01/29/2019 08/31/2015 11/01/2013  WBC 4.0 - 10.5 K/uL - 5.5 6.9  RBC 4.22 - 5.81 MIL/uL - 4.85 5.10  HGB 13.0 - 17.0 g/dL 15.6 15.3 16.2  HCT 39 - 52 % 46.0 46.1 47.9  PLT 150 - 400 K/uL - 180 298  NEUTROABS 1.7 - 7.7 K/uL - - 5.0  LYMPHSABS 0.7 - 4.0 K/uL - - 1.0     Body mass index is 27.12 kg/m.  Orders:  No orders of the defined types were placed in this encounter.  No orders of the defined types were placed in this encounter.    Procedures: No procedures performed  Clinical Data: No additional findings.  ROS:  All other systems negative, except as noted in the HPI. Review of Systems  Objective: Vital Signs: Ht 6' (1.829 m)   Wt 200 lb (90.7 kg)   BMI 27.12 kg/m   Specialty Comments:  No specialty comments available.  PMFS History: Patient Active Problem List   Diagnosis Date Noted  . Bilateral primary osteoarthritis of knee 09/07/2019  . Hx of adenomatous colonic polyps 07/25/2015  . Chest pain 01/05/2014  . Coronary artery disease 02/28/2011  . Glucose intolerance (pre-diabetes) 02/28/2011  . ALCOHOL ABUSE 11/21/2009  . WOUND, FOOT 02/11/2009  . VENOUS INSUFFICIENCY, CHRONIC, RIGHT LEG 06/07/2008  . HYPERLIPIDEMIA 03/11/2007  . HYPERTENSION 03/11/2007  . LOW BACK PAIN 03/11/2007   Past Medical History:  Diagnosis Date  . Alcohol abuse   . CAD (coronary artery disease)    a. s/p CABG  2009 (Dr. Roxy Manns - LIMA-OM2, L radial - PDA, SVG-Dx) // b. Nuc 8/15: No ischemia, EF 65, normal study // c. Nuc 3/17: No significant reversible ischemia. LVEF 64% with normal wall motion. This is a low risk study.   . Carotid stenosis    Carotid US 3/19: Bilateral ICA 1-39    . DDD (degenerative disc disease)   . Gout   . HLD (hyperlipidemia)   . Hx of adenomatous colonic polyps 07/25/2015  . Hyperglycemia   . Hypertension   . Lumbago    (low back pain)   . Neuropathy   . PAF (paroxysmal atrial fibrillation) ([place])    Anticoag (previously on Xarelto) stopped in 2017 due to concerns over bleeding risk in the setting of heavy alcohol use)  . Paroxysmal atrial flutter ([place])    TEE 6/15: EF 50-55, no RWMA, mild MR, LAE, no LAA clot >> s/p DCCV to restore NSR  . S/P CABG x 1   . Sciatica     Family History  Problem Relation Age of Onset  . Coronary artery disease Unknown        family history  . Heart disease Father        CABG   . Heart attack Father   . Stroke Unknown        family history  . Colon cancer Neg Hx   . Colon polyps Neg Hx   . Esophageal cancer Neg Hx   . Rectal cancer Neg Hx   . Stomach cancer Neg Hx     Past Surgical History:  Procedure Laterality Date  . ABDOMINAL AORTOGRAM W/LOWER EXTREMITY Bilateral 01/29/2019   Procedure: ABDOMINAL AORTOGRAM W/LOWER EXTREMITY;  Surgeon: Angelia Mould, MD;  Location: [place] [facility];  Service: Cardiovascular;  Laterality: Bilateral;  . CARDIAC CATHETERIZATION  4.17.09  . CARDIOVERSION N/A 11/03/2013   Procedure: CARDIOVERSION;  Surgeon: Dorothy Spark, MD;  Location: [facility] ENDOSCOPY;  Service: Cardiovascular;  Laterality: N/A;  . COLONOSCOPY  10-01-2002   gessner-tics and hems   . CORONARY ARTERY BYPASS GRAFT  5.8.09   x3  . INGUINAL HERNIA REPAIR Left   . LAPAROSCOPIC INGUINAL HERNIA REPAIR Right   . TEE WITHOUT CARDIOVERSION N/A 11/03/2013   Procedure: TRANSESOPHAGEAL ECHOCARDIOGRAM (TEE);  Surgeon: Dorothy Spark, MD;  Location: [place];  Service: Cardiovascular;  Laterality: N/A;  . TONSILLECTOMY AND ADENOIDECTOMY    . WRIST SURGERY     Social History   Occupational History  . Not on file  Tobacco Use  . Smoking status: Never Smoker  . Smokeless tobacco: Never Used   Substance and Sexual Activity  . Alcohol use: Yes    Alcohol/week: 0.0 standard drinks    Comment: 6 beers per week  . Drug use: No  . Sexual activity: Not on file

## 2019-11-19 ENCOUNTER — Telehealth: Payer: Self-pay | Admitting: Orthopedic Surgery

## 2019-11-19 NOTE — Telephone Encounter (Signed)
I called patient and he wanted to know if he could use mupirocin on his left lower extremity and wear the compression sock. I  advised him that Dr Sharol Given would like for him to wear the sock next to his skin but if the dermatologist recommended for him to use this for a rash then that would be okay. He suggested for me to delete this message and his question after I suggested that I would ask our PA after she is finished with the patient. He also stated that it is not important and he is fine and he was just seen in our office yesterday.

## 2019-11-19 NOTE — Telephone Encounter (Signed)
Patient called and requesting a call back about some medication he was told about for wounds. Patient stated the medication is called mutirocian. Please give patient a call about this matter at [phone].

## 2019-11-23 ENCOUNTER — Encounter: Payer: Self-pay | Admitting: Orthopedic Surgery

## 2019-11-23 ENCOUNTER — Other Ambulatory Visit: Payer: Self-pay

## 2019-11-23 ENCOUNTER — Ambulatory Visit (INDEPENDENT_AMBULATORY_CARE_PROVIDER_SITE_OTHER): Payer: Medicare Other | Admitting: Orthopedic Surgery

## 2019-11-23 VITALS — Ht 72.0 in | Wt 200.0 lb

## 2019-11-23 DIAGNOSIS — R599 Enlarged lymph nodes, unspecified: Secondary | ICD-10-CM

## 2019-11-23 DIAGNOSIS — R590 Localized enlarged lymph nodes: Secondary | ICD-10-CM

## 2019-11-23 NOTE — Progress Notes (Signed)
Office Visit Note   Patient: [name]           Date of Birth: [birth year]           MRN: [id number] Visit Date: 11/23/2019              Requested by: No referring provider defined for this encounter. PCP: Patient, No Pcp Per  Chief Complaint  Patient presents with   Left Ankle - Wound Check      HPI: Patient is a 69 year old gentleman who presents in follow-up for both lower extremities.  Patient states he has been having increasing dermatitis increasing drainage and swelling in both legs despite wearing compression stockings.  Patient has undergone arterial studies for both lower extremities and was not felt to be a surgical candidate.  Patient complains of groin pain with the increased swelling.  Assessment & Plan: Visit Diagnoses:  1. Inguinal lymphadenopathy   2. Enlarged lymph nodes     Plan: Patient will continue with the compression stockings will apply Ace wraps today.  We will set him up for a CT scan of the pelvis with contrast to further evaluate the lymphadenopathy.  A CBC and a complete metabolic profile has been ordered as well.  Follow-Up Instructions: No follow-ups on file.   Ortho Exam  Patient is alert, oriented, no adenopathy, well-dressed, normal affect, normal respiratory effort. Examination patient has increased dermatitis in both legs with weeping edema there is no cellulitis.  Patient has lymphadenopathy in the groins bilaterally.  The lymph nodes are approximately 2 cm in diameter.  A CBC and complete metabolic profile is drawn.  Imaging: No results found. No images are attached to the encounter.  Labs: Lab Results  Component Value Date   HGBA1C (H) 09/19/2010    6.4 (NOTE)                                                                       According to the ADA Clinical Practice Recommendations for 2011, when HbA1c is used as a screening test:   >=6.5%   Diagnostic of Diabetes Mellitus           (if abnormal result  is confirmed)   5.7-6.4%   Increased risk of developing Diabetes Mellitus  References:Diagnosis and Classification of Diabetes Mellitus,Diabetes KKXF,8182,99(BZJIR 1):S62-S69 and Standards of Medical Care in         Diabetes - 2011,Diabetes Care,2011,34  (Suppl 1):S11-S61.   HGBA1C (H) 10/30/2009    7.0 (NOTE)                                                                       According to the ADA Clinical Practice Recommendations for 2011, when HbA1c is used as a screening test:   >=6.5%   Diagnostic of Diabetes Mellitus           (if abnormal result  is confirmed)  5.7-6.4%   Increased risk of developing Diabetes Mellitus  References:Diagnosis and Classification of  Diabetes Mellitus,Diabetes OEVO,3500,93(GHWEX 1):S62-S69 and Standards of Medical Care in         Diabetes - 2011,Diabetes HBZJ,6967,89  (Suppl 1):S11-S61.   HGBA1C  09/29/2007    5.7 (NOTE)   The ADA recommends the following therapeutic goals for glycemic   control related to Hgb A1C measurement:   Goal of Therapy:   < 7.0% Hgb A1C   Action Suggested:  > 8.0% Hgb A1C   Ref:  Diabetes Care, 22, Suppl. 1, 1999   ESRSEDRATE 17 (H) 10/30/2009   CRP 4.3 (H) 10/30/2009   REPTSTATUS 12/19/2018 FINAL 12/17/2018   GRAMSTAIN NO WBC SEEN NO ORGANISMS SEEN  12/17/2018   CULT  12/17/2018    FEW NORMAL SKIN FLORA Performed at [facility], [place] [address], [address] [place] [address], [place] [address]    LABORGA METHICILLIN RESISTANT STAPHYLOCOCCUS AUREUS 10/17/2007     Lab Results  Component Value Date   ALBUMIN 4.4 08/31/2015   ALBUMIN 4.1 01/27/2014   ALBUMIN 4.3 11/01/2013    No results found for: MG No results found for: VD25OH  No results found for: PREALBUMIN CBC EXTENDED Latest Ref Rng & Units 01/29/2019 08/31/2015 11/01/2013  WBC 4.0 - 10.5 K/uL - 5.5 6.9  RBC 4.22 - 5.81 MIL/uL - 4.85 5.10  HGB 13.0 - 17.0 g/dL 15.6 15.3 16.2  HCT 39 - 52 % 46.0 46.1 47.9  PLT 150 - 400 K/uL - 180 298  NEUTROABS 1.7 - 7.7 K/uL - - 5.0  LYMPHSABS 0.7 - 4.0 K/uL -  - 1.0     Body mass index is 27.12 kg/m.  Orders:  Orders Placed This Encounter  Procedures   CT ABDOMEN PELVIS W CONTRAST   CBC with Differential   Comprehensive Metabolic Panel (CMET)   No orders of the defined types were placed in this encounter.    Procedures: No procedures performed  Clinical Data: No additional findings.  ROS:  All other systems negative, except as noted in the HPI. Review of Systems  Objective: Vital Signs: Ht 6' (1.829 m)    Wt 200 lb (90.7 kg)    BMI 27.12 kg/m   Specialty Comments:  No specialty comments available.  PMFS History: Patient Active Problem List   Diagnosis Date Noted   Bilateral primary osteoarthritis of knee 09/07/2019   Hx of adenomatous colonic polyps 07/25/2015   Chest pain 01/05/2014   Coronary artery disease 02/28/2011   Glucose intolerance (pre-diabetes) 02/28/2011   ALCOHOL ABUSE 11/21/2009   WOUND, FOOT 02/11/2009   VENOUS INSUFFICIENCY, CHRONIC, RIGHT LEG 06/07/2008   HYPERLIPIDEMIA 03/11/2007   HYPERTENSION 03/11/2007   LOW BACK PAIN 03/11/2007   Past Medical History:  Diagnosis Date   Alcohol abuse    CAD (coronary artery disease)    a. s/p CABG 2009 (Dr. Roxy Manns - LIMA-OM2, L radial - PDA, SVG-Dx) // b. Nuc 8/15: No ischemia, EF 65, normal study // c. Nuc 3/17: No significant reversible ischemia. LVEF 64% with normal wall motion. This is a low risk study.    Carotid stenosis    Carotid US 3/19: Bilateral ICA 1-39   DDD (degenerative disc disease)    Gout    HLD (hyperlipidemia)    Hx of adenomatous colonic polyps 07/25/2015   Hyperglycemia    Hypertension    Lumbago    (low back pain)    Neuropathy    PAF (paroxysmal atrial fibrillation) ([place])    Anticoag (previously on Xarelto) stopped in 2017 due to concerns over bleeding risk  in the setting of heavy alcohol use)   Paroxysmal atrial flutter ([place])    TEE 6/15: EF 50-55, no RWMA, mild MR, LAE, no LAA clot >> s/p DCCV to  restore NSR   S/P CABG x 1    Sciatica     Family History  Problem Relation Age of Onset   Coronary artery disease Unknown        family history   Heart disease Father        CABG    Heart attack Father    Stroke Unknown        family history   Colon cancer Neg Hx    Colon polyps Neg Hx    Esophageal cancer Neg Hx    Rectal cancer Neg Hx    Stomach cancer Neg Hx     Past Surgical History:  Procedure Laterality Date   ABDOMINAL AORTOGRAM W/LOWER EXTREMITY Bilateral 01/29/2019   Procedure: ABDOMINAL AORTOGRAM W/LOWER EXTREMITY;  Surgeon: Angelia Mould, MD;  Location: [place] [facility];  Service: Cardiovascular;  Laterality: Bilateral;   CARDIAC CATHETERIZATION  4.17.09   CARDIOVERSION N/A 11/03/2013   Procedure: CARDIOVERSION;  Surgeon: Dorothy Spark, MD;  Location: [facility];  Service: Cardiovascular;  Laterality: N/A;   COLONOSCOPY  10-01-2002   gessner-tics and hems    CORONARY ARTERY BYPASS GRAFT  5.8.09   x3   INGUINAL HERNIA REPAIR Left    LAPAROSCOPIC INGUINAL HERNIA REPAIR Right    TEE WITHOUT CARDIOVERSION N/A 11/03/2013   Procedure: TRANSESOPHAGEAL ECHOCARDIOGRAM (TEE);  Surgeon: Dorothy Spark, MD;  Location: [facility] ENDOSCOPY;  Service: Cardiovascular;  Laterality: N/A;   TONSILLECTOMY AND ADENOIDECTOMY     WRIST SURGERY     Social History   Occupational History   Not on file  Tobacco Use   Smoking status: Never Smoker   Smokeless tobacco: Never Used  Substance and Sexual Activity   Alcohol use: Yes    Alcohol/week: 0.0 standard drinks    Comment: 6 beers per week   Drug use: No   Sexual activity: Not on file

## 2019-11-24 LAB — COMPREHENSIVE METABOLIC PANEL
AG Ratio: 1.6 (calc) (ref 1.0–2.5)
ALT: 21 U/L (ref 9–46)
AST: 26 U/L (ref 10–35)
Albumin: 4.5 g/dL (ref 3.6–5.1)
Alkaline phosphatase (APISO): 90 U/L (ref 35–144)
BUN/Creatinine Ratio: 14 (calc) (ref 6–22)
BUN: 9 mg/dL (ref 7–25)
CO2: 30 mmol/L (ref 20–32)
Calcium: 9.4 mg/dL (ref 8.6–10.3)
Chloride: 92 mmol/L — ABNORMAL LOW (ref 98–110)
Creat: 0.64 mg/dL — ABNORMAL LOW (ref 0.70–1.25)
Globulin: 2.8 g/dL (calc) (ref 1.9–3.7)
Glucose, Bld: 93 mg/dL (ref 65–99)
Potassium: 4.6 mmol/L (ref 3.5–5.3)
Sodium: 130 mmol/L — ABNORMAL LOW (ref 135–146)
Total Bilirubin: 0.5 mg/dL (ref 0.2–1.2)
Total Protein: 7.3 g/dL (ref 6.1–8.1)

## 2019-11-24 LAB — CBC WITH DIFFERENTIAL/PLATELET
Absolute Monocytes: 736 cells/uL (ref 200–950)
Basophils Absolute: 51 cells/uL (ref 0–200)
Basophils Relative: 0.8 %
Eosinophils Absolute: 755 cells/uL — ABNORMAL HIGH (ref 15–500)
Eosinophils Relative: 11.8 %
HCT: 47.8 % (ref 38.5–50.0)
Hemoglobin: 15.6 g/dL (ref 13.2–17.1)
Lymphs Abs: 1043 cells/uL (ref 850–3900)
MCH: 31.8 pg (ref 27.0–33.0)
MCHC: 32.6 g/dL (ref 32.0–36.0)
MCV: 97.4 fL (ref 80.0–100.0)
MPV: 9.9 fL (ref 7.5–12.5)
Monocytes Relative: 11.5 %
Neutro Abs: 3814 cells/uL (ref 1500–7800)
Neutrophils Relative %: 59.6 %
Platelets: 212 10*3/uL (ref 140–400)
RBC: 4.91 10*6/uL (ref 4.20–5.80)
RDW: 12.3 % (ref 11.0–15.0)
Total Lymphocyte: 16.3 %
WBC: 6.4 10*3/uL (ref 3.8–10.8)

## 2019-11-25 ENCOUNTER — Other Ambulatory Visit: Payer: Self-pay | Admitting: Orthopedic Surgery

## 2019-11-26 NOTE — Telephone Encounter (Signed)
Please advise 

## 2019-12-02 ENCOUNTER — Ambulatory Visit: Payer: Medicare Other | Admitting: Orthopedic Surgery

## 2019-12-08 ENCOUNTER — Other Ambulatory Visit: Payer: Medicare Other

## 2019-12-13 ENCOUNTER — Ambulatory Visit: Payer: Medicare Other | Admitting: Orthopedic Surgery

## 2019-12-17 ENCOUNTER — Inpatient Hospital Stay: Admission: RE | Admit: 2019-12-17 | Payer: Medicare Other | Source: Ambulatory Visit

## 2020-01-05 ENCOUNTER — Other Ambulatory Visit: Payer: Medicare Other

## 2020-01-07 ENCOUNTER — Encounter: Payer: Self-pay | Admitting: Physician Assistant

## 2020-01-07 ENCOUNTER — Ambulatory Visit (INDEPENDENT_AMBULATORY_CARE_PROVIDER_SITE_OTHER): Payer: Medicare Other | Admitting: Physician Assistant

## 2020-01-07 DIAGNOSIS — L97929 Non-pressure chronic ulcer of unspecified part of left lower leg with unspecified severity: Secondary | ICD-10-CM | POA: Diagnosis not present

## 2020-01-07 DIAGNOSIS — I87332 Chronic venous hypertension (idiopathic) with ulcer and inflammation of left lower extremity: Secondary | ICD-10-CM | POA: Diagnosis not present

## 2020-01-07 MED ORDER — DOXYCYCLINE HYCLATE 100 MG PO TABS: 100.0000 mg | ORAL_TABLET | Freq: Two times a day (BID) | ORAL | 0 refills | Status: DC

## 2020-01-07 NOTE — Progress Notes (Signed)
Office Visit Note   Patient: [name]           Date of Birth: 1951-06-03           MRN: [id number] Visit Date: 01/07/2020              Requested by: No referring provider defined for this encounter. PCP: Patient, No Pcp Per  Chief Complaint  Patient presents with  . Right Foot - Wound Check      HPI: Patient presents today complaining of right second toe ulcer and drainage.  He bumped his toe and since has noticed a small ulcer on the end of the toe and some increasing erythema.  He also has been followed by Dr. Sharol Given for a post dermatitis issue.  Both of his lower extremities were involved.  He was wearing VIVE compression stockings bilaterally.  He is now only wearing one on the left.  He also has a small open ankle ulcer on the lateral ankle.  He has been using a nitro glycerin patch to promote healing.  He denies any fever chills.  He does say that his lymphadenopathy has dissipated  Assessment & Plan: Visit Diagnoses: No diagnosis found.  Plan: Patient should continue using his socks.  I have placed him on a course of doxycycline and he also should take probiotic.  He would like to follow-up with Dr. Sharol Given next week.  Follow-Up Instructions: No follow-ups on file.   Ortho Exam  Patient is alert, oriented, no adenopathy, well-dressed, normal affect, normal respiratory effort.  Focused examination demonstrates on the right foot swelling of the second toe with some erythema.  There is no erythema past the base of the toe.  He does have a small ulcer on the end of the toe.  Does not quite probe to bone.  There is no foul odor no purulent drainage or fluctuance.  Bilateral lower extremities he has some venous stasis changes especially on the left.  Also resolving dermatitis.  Nitroglycerin patch is in place around a small ulcer on the left lateral ankle.  Thickened eschar was removed.  There is fibrinous tissue does not probe to bone no evidence of any new  infection  Imaging: No results found. No images are attached to the encounter.  Labs: Lab Results  Component Value Date   HGBA1C (H) 09/19/2010    6.4 (NOTE)                                                                       According to the ADA Clinical Practice Recommendations for 2011, when HbA1c is used as a screening test:   >=6.5%   Diagnostic of Diabetes Mellitus           (if abnormal result  is confirmed)  5.7-6.4%   Increased risk of developing Diabetes Mellitus  References:Diagnosis and Classification of Diabetes Mellitus,Diabetes KZLD,3570,17(BLTJQ 1):S62-S69 and Standards of Medical Care in         Diabetes - 2011,Diabetes Care,2011,34  (Suppl 1):S11-S61.   HGBA1C (H) 10/30/2009    7.0 (NOTE)  According to the ADA Clinical Practice Recommendations for 2011, when HbA1c is used as a screening test:   >=6.5%   Diagnostic of Diabetes Mellitus           (if abnormal result  is confirmed)  5.7-6.4%   Increased risk of developing Diabetes Mellitus  References:Diagnosis and Classification of Diabetes Mellitus,Diabetes VQMG,8676,19(JKDTO 1):S62-S69 and Standards of Medical Care in         Diabetes - 2011,Diabetes IZTI,4580,99  (Suppl 1):S11-S61.   HGBA1C  09/29/2007    5.7 (NOTE)   The ADA recommends the following therapeutic goals for glycemic   control related to Hgb A1C measurement:   Goal of Therapy:   < 7.0% Hgb A1C   Action Suggested:  > 8.0% Hgb A1C   Ref:  Diabetes Care, 22, Suppl. 1, 1999   ESRSEDRATE 17 (H) 10/30/2009   CRP 4.3 (H) 10/30/2009   REPTSTATUS 12/19/2018 FINAL 12/17/2018   GRAMSTAIN NO WBC SEEN NO ORGANISMS SEEN  12/17/2018   CULT  12/17/2018    FEW NORMAL SKIN FLORA Performed at [facility], [place] [address], [place], [place] [address]    LABORGA METHICILLIN RESISTANT STAPHYLOCOCCUS AUREUS 10/17/2007     Lab Results  Component Value Date   ALBUMIN 4.4 08/31/2015   ALBUMIN 4.1  01/27/2014   ALBUMIN 4.3 11/01/2013    No results found for: MG No results found for: VD25OH  No results found for: PREALBUMIN CBC EXTENDED Latest Ref Rng & Units 11/23/2019 01/29/2019 08/31/2015  WBC 3.8 - 10.8 Thousand/uL 6.4 - 5.5  RBC 4.20 - 5.80 Million/uL 4.91 - 4.85  HGB 13.2 - 17.1 g/dL 15.6 15.6 15.3  HCT 38 - 50 % 47.8 46.0 46.1  PLT 140 - 400 Thousand/uL 212 - 180  NEUTROABS 1,500 - 7,800 cells/uL 3,814 - -  LYMPHSABS 850 - 3,900 cells/uL 1,043 - -     There is no height or weight on file to calculate BMI.  Orders:  No orders of the defined types were placed in this encounter.  Meds ordered this encounter  Medications  . doxycycline (VIBRA-TABS) 100 MG tablet    Sig: Take 1 tablet (100 mg total) by mouth 2 (two) times daily.    Dispense:  30 tablet    Refill:  0     Procedures: No procedures performed  Clinical Data: No additional findings.  ROS:  All other systems negative, except as noted in the HPI. Review of Systems  Objective: Vital Signs: There were no vitals taken for this visit.  Specialty Comments:  No specialty comments available.  PMFS History: Patient Active Problem List   Diagnosis Date Noted  . Bilateral primary osteoarthritis of knee 09/07/2019  . Hx of adenomatous colonic polyps 07/25/2015  . Chest pain 01/05/2014  . Coronary artery disease 02/28/2011  . Glucose intolerance (pre-diabetes) 02/28/2011  . ALCOHOL ABUSE 11/21/2009  . WOUND, FOOT 02/11/2009  . VENOUS INSUFFICIENCY, CHRONIC, RIGHT LEG 06/07/2008  . HYPERLIPIDEMIA 03/11/2007  . HYPERTENSION 03/11/2007  . LOW BACK PAIN 03/11/2007   Past Medical History:  Diagnosis Date  . Alcohol abuse   . CAD (coronary artery disease)    a. s/p CABG 2009 (Dr. Roxy Manns - LIMA-OM2, L radial - PDA, SVG-Dx) // b. Nuc 8/15: No ischemia, EF 65, normal study // c. Nuc 3/17: No significant reversible ischemia. LVEF 64% with normal wall motion. This is a low risk study.   . Carotid stenosis     Carotid US 3/19: Bilateral ICA  1-39  . DDD (degenerative disc disease)   . Gout   . HLD (hyperlipidemia)   . Hx of adenomatous colonic polyps 07/25/2015  . Hyperglycemia   . Hypertension   . Lumbago    (low back pain)   . Neuropathy   . PAF (paroxysmal atrial fibrillation) ([place])    Anticoag (previously on Xarelto) stopped in 2017 due to concerns over bleeding risk in the setting of heavy alcohol use)  . Paroxysmal atrial flutter ([place])    TEE 6/15: EF 50-55, no RWMA, mild MR, LAE, no LAA clot >> s/p DCCV to restore NSR  . S/P CABG x 1   . Sciatica     Family History  Problem Relation Age of Onset  . Coronary artery disease Unknown        family history  . Heart disease Father        CABG   . Heart attack Father   . Stroke Unknown        family history  . Colon cancer Neg Hx   . Colon polyps Neg Hx   . Esophageal cancer Neg Hx   . Rectal cancer Neg Hx   . Stomach cancer Neg Hx     Past Surgical History:  Procedure Laterality Date  . ABDOMINAL AORTOGRAM W/LOWER EXTREMITY Bilateral 01/29/2019   Procedure: ABDOMINAL AORTOGRAM W/LOWER EXTREMITY;  Surgeon: Angelia Mould, MD;  Location: [place] [facility];  Service: Cardiovascular;  Laterality: Bilateral;  . CARDIAC CATHETERIZATION  4.17.09  . CARDIOVERSION N/A 11/03/2013   Procedure: CARDIOVERSION;  Surgeon: Dorothy Spark, MD;  Location: [facility] ENDOSCOPY;  Service: Cardiovascular;  Laterality: N/A;  . COLONOSCOPY  10-01-2002   gessner-tics and hems   . CORONARY ARTERY BYPASS GRAFT  5.8.09   x3  . INGUINAL HERNIA REPAIR Left   . LAPAROSCOPIC INGUINAL HERNIA REPAIR Right   . TEE WITHOUT CARDIOVERSION N/A 11/03/2013   Procedure: TRANSESOPHAGEAL ECHOCARDIOGRAM (TEE);  Surgeon: Dorothy Spark, MD;  Location: [place];  Service: Cardiovascular;  Laterality: N/A;  . TONSILLECTOMY AND ADENOIDECTOMY    . WRIST SURGERY     Social History   Occupational History  . Not on file  Tobacco Use  . Smoking status: Never Smoker   . Smokeless tobacco: Never Used  Substance and Sexual Activity  . Alcohol use: Yes    Alcohol/week: 0.0 standard drinks    Comment: 6 beers per week  . Drug use: No  . Sexual activity: Not on file

## 2020-01-11 ENCOUNTER — Ambulatory Visit (INDEPENDENT_AMBULATORY_CARE_PROVIDER_SITE_OTHER): Payer: Medicare Other | Admitting: Orthopedic Surgery

## 2020-01-11 ENCOUNTER — Encounter: Payer: Self-pay | Admitting: Physician Assistant

## 2020-01-11 VITALS — Ht 72.0 in | Wt 200.0 lb

## 2020-01-11 DIAGNOSIS — I87332 Chronic venous hypertension (idiopathic) with ulcer and inflammation of left lower extremity: Secondary | ICD-10-CM | POA: Diagnosis not present

## 2020-01-11 DIAGNOSIS — L97929 Non-pressure chronic ulcer of unspecified part of left lower leg with unspecified severity: Secondary | ICD-10-CM

## 2020-01-24 ENCOUNTER — Ambulatory Visit: Payer: Medicare Other | Admitting: Orthopedic Surgery

## 2020-01-27 ENCOUNTER — Encounter: Payer: Self-pay | Admitting: Orthopedic Surgery

## 2020-01-27 NOTE — Progress Notes (Signed)
Office Visit Note   Patient: [name]           Date of Birth: 1950-06-10           MRN: [id number] Visit Date: 01/11/2020              Requested by: No referring provider defined for this encounter. PCP: Patient, No Pcp Per  Chief Complaint  Patient presents with  . Right Foot - Follow-up, Wound Check      HPI: Patient is a 69 year old gentleman who presents in follow-up for right foot and ankle wound.  He is currently on doxycycline as well as probiotics he is using nitroglycerin patch.  He states he is using betamethasone twice a day and wearing his compression sock and changing this daily.  Assessment & Plan: Visit Diagnoses:  1. 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  Follow-Up Instructions: Return in about 2 weeks (around 01/25/2020).   Ortho Exam  Patient is alert, oriented, no adenopathy, well-dressed, normal affect, normal respiratory effort. 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  There is no cellulitis.  The ulcer over the lateral malleolus is improving on the left.  Imaging: No results found. No images are attached to the encounter.  Labs: Lab Results  Component Value Date   HGBA1C (H) 09/19/2010    6.4 (NOTE)                                                                       According to the ADA Clinical Practice Recommendations for 2011, when HbA1c is used as a screening test:   >=6.5%   Diagnostic of Diabetes Mellitus           (if abnormal result  is confirmed)  5.7-6.4%   Increased risk of developing Diabetes Mellitus   References:Diagnosis and Classification of Diabetes Mellitus,Diabetes MBWG,6659,93(TTSVX 1):S62-S69 and Standards of Medical Care in         Diabetes - 2011,Diabetes Care,2011,34  (Suppl 1):S11-S61.   HGBA1C (H) 10/30/2009    7.0 (NOTE)                                                                       According to the ADA Clinical Practice Recommendations for 2011, when HbA1c is used as a screening test:   >=6.5%   Diagnostic of Diabetes Mellitus           (if abnormal result  is confirmed)  5.7-6.4%   Increased risk of developing Diabetes Mellitus  References:Diagnosis and Classification of Diabetes Mellitus,Diabetes Care,2011,34(Suppl 1):S62-S69 and Standards  of Medical Care in         Diabetes - 2011,Diabetes Care,2011,34  (Suppl 1):S11-S61.   HGBA1C  09/29/2007    5.7 (NOTE)   The ADA recommends the following therapeutic goals for glycemic   control related to Hgb A1C measurement:   Goal of Therapy:   < 7.0% Hgb A1C   Action Suggested:  > 8.0% Hgb A1C   Ref:  Diabetes Care, 22, Suppl. 1, 1999   ESRSEDRATE 17 (H) 10/30/2009   CRP 4.3 (H) 10/30/2009   REPTSTATUS 12/19/2018 FINAL 12/17/2018   GRAMSTAIN NO WBC SEEN NO ORGANISMS SEEN  12/17/2018   CULT  12/17/2018    FEW NORMAL SKIN FLORA Performed at [facility], [place] [address] [place], [place] [address]    LABORGA METHICILLIN RESISTANT STAPHYLOCOCCUS AUREUS 10/17/2007     Lab Results  Component Value Date   ALBUMIN 4.4 08/31/2015   ALBUMIN 4.1 01/27/2014   ALBUMIN 4.3 11/01/2013    No results found for: MG No results found for: VD25OH  No results found for: PREALBUMIN CBC EXTENDED Latest Ref Rng & Units 11/23/2019 01/29/2019 08/31/2015  WBC 3.8 - 10.8 Thousand/uL 6.4 - 5.5  RBC 4.20 - 5.80 Million/uL 4.91 - 4.85  HGB 13.2 - 17.1 g/dL 15.6 15.6 15.3  HCT 38 - 50 % 47.8 46.0 46.1  PLT 140 - 400 Thousand/uL 212 - 180  NEUTROABS 1,500 - 7,800 cells/uL 3,814 - -  LYMPHSABS 850 - 3,900 cells/uL 1,043 - -     Body  mass index is 27.12 kg/m.  Orders:  No orders of the defined types were placed in this encounter.  No orders of the defined types were placed in this encounter.    Procedures: No procedures performed  Clinical Data: No additional findings.  ROS:  All other systems negative, except as noted in the HPI. Review of Systems  Objective: Vital Signs: Ht 6' (1.829 m)   Wt 200 lb (90.7 kg)   BMI 27.12 kg/m   Specialty Comments:  No specialty comments available.  PMFS History: Patient Active Problem List   Diagnosis Date Noted  . Bilateral primary osteoarthritis of knee 09/07/2019  . Hx of adenomatous colonic polyps 07/25/2015  . Chest pain 01/05/2014  . Coronary artery disease 02/28/2011  . Glucose intolerance (pre-diabetes) 02/28/2011  . ALCOHOL ABUSE 11/21/2009  . WOUND, FOOT 02/11/2009  . VENOUS INSUFFICIENCY, CHRONIC, RIGHT LEG 06/07/2008  . HYPERLIPIDEMIA 03/11/2007  . HYPERTENSION 03/11/2007  . LOW BACK PAIN 03/11/2007   Past Medical History:  Diagnosis Date  . Alcohol abuse   . CAD (coronary artery disease)    a. s/p CABG 2009 (Dr. Roxy Manns - LIMA-OM2, L radial - PDA, SVG-Dx) // b. Nuc 8/15: No ischemia, EF 65, normal study // c. Nuc 3/17: No significant reversible ischemia. LVEF 64% with normal wall motion. This is a low risk study.   . Carotid stenosis    Carotid US 3/19: Bilateral ICA 1-39  . DDD (degenerative disc disease)   . Gout   . HLD (hyperlipidemia)   . Hx of adenomatous colonic polyps 07/25/2015  . Hyperglycemia   . Hypertension   . Lumbago    (low back pain)   . Neuropathy   . PAF (paroxysmal atrial fibrillation) ([place])    Anticoag (previously on Xarelto) stopped in 2017 due to concerns over bleeding risk in the setting of heavy alcohol use)  . Paroxysmal atrial flutter ([place])    TEE 6/15: EF 50-55, no RWMA,  mild MR, LAE, no LAA clot >> s/p DCCV to restore NSR  . S/P CABG x 1   . Sciatica     Family History  Problem Relation Age of Onset  .  Coronary artery disease Unknown        family history  . Heart disease Father        CABG   . Heart attack Father   . Stroke Unknown        family history  . Colon cancer Neg Hx   . Colon polyps Neg Hx   . Esophageal cancer Neg Hx   . Rectal cancer Neg Hx   . Stomach cancer Neg Hx     Past Surgical History:  Procedure Laterality Date  . ABDOMINAL AORTOGRAM W/LOWER EXTREMITY Bilateral 01/29/2019   Procedure: ABDOMINAL AORTOGRAM W/LOWER EXTREMITY;  Surgeon: Angelia Mould, MD;  Location: [place] [facility];  Service: Cardiovascular;  Laterality: Bilateral;  . CARDIAC CATHETERIZATION  4.17.09  . CARDIOVERSION N/A 11/03/2013   Procedure: CARDIOVERSION;  Surgeon: Dorothy Spark, MD;  Location: [facility] ENDOSCOPY;  Service: Cardiovascular;  Laterality: N/A;  . COLONOSCOPY  10-01-2002   gessner-tics and hems   . CORONARY ARTERY BYPASS GRAFT  5.8.09   x3  . INGUINAL HERNIA REPAIR Left   . LAPAROSCOPIC INGUINAL HERNIA REPAIR Right   . TEE WITHOUT CARDIOVERSION N/A 11/03/2013   Procedure: TRANSESOPHAGEAL ECHOCARDIOGRAM (TEE);  Surgeon: Dorothy Spark, MD;  Location: [place];  Service: Cardiovascular;  Laterality: N/A;  . TONSILLECTOMY AND ADENOIDECTOMY    . WRIST SURGERY     Social History   Occupational History  . Not on file  Tobacco Use  . Smoking status: Never Smoker  . Smokeless tobacco: Never Used  Substance and Sexual Activity  . Alcohol use: Yes    Alcohol/week: 0.0 standard drinks    Comment: 6 beers per week  . Drug use: No  . Sexual activity: Not on file

## 2020-02-01 ENCOUNTER — Encounter: Payer: Self-pay | Admitting: Orthopedic Surgery

## 2020-02-01 ENCOUNTER — Ambulatory Visit (INDEPENDENT_AMBULATORY_CARE_PROVIDER_SITE_OTHER): Payer: Medicare Other | Admitting: Orthopedic Surgery

## 2020-02-01 DIAGNOSIS — I87332 Chronic venous hypertension (idiopathic) with ulcer and inflammation of left lower extremity: Secondary | ICD-10-CM | POA: Diagnosis not present

## 2020-02-01 DIAGNOSIS — L97929 Non-pressure chronic ulcer of unspecified part of left lower leg with unspecified severity: Secondary | ICD-10-CM

## 2020-02-01 MED ORDER — DOXYCYCLINE HYCLATE 100 MG PO TABS: 100.0000 mg | ORAL_TABLET | Freq: Two times a day (BID) | ORAL | 0 refills | Status: DC

## 2020-02-01 NOTE — Progress Notes (Signed)
Office Visit Note   Patient: [name]           Date of Birth: 1950/10/20           MRN: [id number] Visit Date: 02/01/2020              Requested by: No referring provider defined for this encounter. PCP: Patient, No Pcp Per  Chief Complaint  Patient presents with  . Right Foot - Follow-up  . Right Ankle - Follow-up      HPI: This is a pleasant gentleman who follows up for his chronic bilateral lower extremity venous stasis disease and venous dermatitis.  Also chronic second toe of the right foot.   Assessment & Plan: Visit Diagnoses: No diagnosis found.  Plan: At the end of the visit the patient says he continues to have left knee pain.  At his next visit we will obtain x-rays of his left knee.  Also history of cervical radiculopathy.  We will get neck and lumbar x-rays at the next visit.  Follow-Up Instructions: No follow-ups on file.   Ortho Exam  Patient is alert, oriented, no adenopathy, well-dressed, normal affect, normal respiratory effort. Bilateral lower extremities venous stasis dermatitis however swelling is very well controlled.  The lateral malleoli are wound has a Nitropatch and is getting smaller.  No foul odor no surrounding cellulitis or drainage.  Right second toe has swollen with an erythema but no cellulitis into the forefoot.  No foul odor.  No gangrene.  Imaging: No results found. No images are attached to the encounter.  Labs: Lab Results  Component Value Date   HGBA1C (H) 09/19/2010    6.4 (NOTE)                                                                       According to the ADA Clinical Practice Recommendations for 2011, when HbA1c is used as a screening test:   >=6.5%   Diagnostic of Diabetes Mellitus           (if abnormal result  is confirmed)  5.7-6.4%   Increased risk of developing Diabetes Mellitus  References:Diagnosis and Classification of Diabetes Mellitus,Diabetes JGGE,3662,94(TMLYY 1):S62-S69 and Standards of Medical  Care in         Diabetes - 2011,Diabetes Care,2011,34  (Suppl 1):S11-S61.   HGBA1C (H) 10/30/2009    7.0 (NOTE)                                                                       According to the ADA Clinical Practice Recommendations for 2011, when HbA1c is used as a screening test:   >=6.5%   Diagnostic of Diabetes Mellitus           (if abnormal result  is confirmed)  5.7-6.4%   Increased risk of developing Diabetes Mellitus  References:Diagnosis and Classification of Diabetes Mellitus,Diabetes TKPT,4656,81(EXNTZ 1):S62-S69 and Standards of Medical Care in         Diabetes - 2011,Diabetes  WUXL,2440,10  (Suppl 1):S11-S61.   HGBA1C  09/29/2007    5.7 (NOTE)   The ADA recommends the following therapeutic goals for glycemic   control related to Hgb A1C measurement:   Goal of Therapy:   < 7.0% Hgb A1C   Action Suggested:  > 8.0% Hgb A1C   Ref:  Diabetes Care, 22, Suppl. 1, 1999   ESRSEDRATE 17 (H) 10/30/2009   CRP 4.3 (H) 10/30/2009   REPTSTATUS 12/19/2018 FINAL 12/17/2018   GRAMSTAIN NO WBC SEEN NO ORGANISMS SEEN  12/17/2018   CULT  12/17/2018    FEW NORMAL SKIN FLORA Performed at [facility], [place] [address] [place], [place] [address]    LABORGA METHICILLIN RESISTANT STAPHYLOCOCCUS AUREUS 10/17/2007     Lab Results  Component Value Date   ALBUMIN 4.4 08/31/2015   ALBUMIN 4.1 01/27/2014   ALBUMIN 4.3 11/01/2013    No results found for: MG No results found for: VD25OH  No results found for: PREALBUMIN CBC EXTENDED Latest Ref Rng & Units 11/23/2019 01/29/2019 08/31/2015  WBC 3.8 - 10.8 Thousand/uL 6.4 - 5.5  RBC 4.20 - 5.80 Million/uL 4.91 - 4.85  HGB 13.2 - 17.1 g/dL 15.6 15.6 15.3  HCT 38 - 50 % 47.8 46.0 46.1  PLT 140 - 400 Thousand/uL 212 - 180  NEUTROABS 1,500 - 7,800 cells/uL 3,814 - -  LYMPHSABS 850 - 3,900 cells/uL 1,043 - -     There is no height or weight on file to calculate BMI.  Orders:  No orders of the defined types were placed in this  encounter.  No orders of the defined types were placed in this encounter.    Procedures: No procedures performed  Clinical Data: No additional findings.  ROS:  All other systems negative, except as noted in the HPI. Review of Systems  Objective: Vital Signs: There were no vitals taken for this visit.  Specialty Comments:  No specialty comments available.  PMFS History: Patient Active Problem List   Diagnosis Date Noted  . Bilateral primary osteoarthritis of knee 09/07/2019  . Hx of adenomatous colonic polyps 07/25/2015  . Chest pain 01/05/2014  . Coronary artery disease 02/28/2011  . Glucose intolerance (pre-diabetes) 02/28/2011  . ALCOHOL ABUSE 11/21/2009  . WOUND, FOOT 02/11/2009  . VENOUS INSUFFICIENCY, CHRONIC, RIGHT LEG 06/07/2008  . HYPERLIPIDEMIA 03/11/2007  . HYPERTENSION 03/11/2007  . LOW BACK PAIN 03/11/2007   Past Medical History:  Diagnosis Date  . Alcohol abuse   . CAD (coronary artery disease)    a. s/p CABG 2009 (Dr. Roxy Manns - LIMA-OM2, L radial - PDA, SVG-Dx) // b. Nuc 8/15: No ischemia, EF 65, normal study // c. Nuc 3/17: No significant reversible ischemia. LVEF 64% with normal wall motion. This is a low risk study.   . Carotid stenosis    Carotid US 3/19: Bilateral ICA 1-39  . DDD (degenerative disc disease)   . Gout   . HLD (hyperlipidemia)   . Hx of adenomatous colonic polyps 07/25/2015  . Hyperglycemia   . Hypertension   . Lumbago    (low back pain)   . Neuropathy   . PAF (paroxysmal atrial fibrillation) ([place])    Anticoag (previously on Xarelto) stopped in 2017 due to concerns over bleeding risk in the setting of heavy alcohol use)  . Paroxysmal atrial flutter ([place])    TEE 6/15: EF 50-55, no RWMA, mild MR, LAE, no LAA clot >> s/p DCCV to restore NSR  . S/P CABG x 1   .  Sciatica     Family History  Problem Relation Age of Onset  . Coronary artery disease Unknown        family history  . Heart disease Father        CABG   . Heart attack  Father   . Stroke Unknown        family history  . Colon cancer Neg Hx   . Colon polyps Neg Hx   . Esophageal cancer Neg Hx   . Rectal cancer Neg Hx   . Stomach cancer Neg Hx     Past Surgical History:  Procedure Laterality Date  . ABDOMINAL AORTOGRAM W/LOWER EXTREMITY Bilateral 01/29/2019   Procedure: ABDOMINAL AORTOGRAM W/LOWER EXTREMITY;  Surgeon: Angelia Mould, MD;  Location: [place] [facility];  Service: Cardiovascular;  Laterality: Bilateral;  . CARDIAC CATHETERIZATION  4.17.09  . CARDIOVERSION N/A 11/03/2013   Procedure: CARDIOVERSION;  Surgeon: Dorothy Spark, MD;  Location: [facility] ENDOSCOPY;  Service: Cardiovascular;  Laterality: N/A;  . COLONOSCOPY  10-01-2002   gessner-tics and hems   . CORONARY ARTERY BYPASS GRAFT  5.8.09   x3  . INGUINAL HERNIA REPAIR Left   . LAPAROSCOPIC INGUINAL HERNIA REPAIR Right   . TEE WITHOUT CARDIOVERSION N/A 11/03/2013   Procedure: TRANSESOPHAGEAL ECHOCARDIOGRAM (TEE);  Surgeon: Dorothy Spark, MD;  Location: [place];  Service: Cardiovascular;  Laterality: N/A;  . TONSILLECTOMY AND ADENOIDECTOMY    . WRIST SURGERY     Social History   Occupational History  . Not on file  Tobacco Use  . Smoking status: Never Smoker  . Smokeless tobacco: Never Used  Substance and Sexual Activity  . Alcohol use: Yes    Alcohol/week: 0.0 standard drinks    Comment: 6 beers per week  . Drug use: No  . Sexual activity: Not on file

## 2020-02-01 NOTE — Addendum Note (Signed)
Addended by: Georgette Dover on: 02/01/2020 04:35 PM   Modules accepted: Orders

## 2020-02-15 ENCOUNTER — Ambulatory Visit (INDEPENDENT_AMBULATORY_CARE_PROVIDER_SITE_OTHER): Payer: Medicare Other | Admitting: Physician Assistant

## 2020-02-15 ENCOUNTER — Ambulatory Visit: Payer: Medicare Other | Attending: Internal Medicine

## 2020-02-15 VITALS — Ht 72.0 in | Wt 200.0 lb

## 2020-02-15 DIAGNOSIS — L97929 Non-pressure chronic ulcer of unspecified part of left lower leg with unspecified severity: Secondary | ICD-10-CM

## 2020-02-15 DIAGNOSIS — Z23 Encounter for immunization: Secondary | ICD-10-CM

## 2020-02-15 DIAGNOSIS — I87332 Chronic venous hypertension (idiopathic) with ulcer and inflammation of left lower extremity: Secondary | ICD-10-CM

## 2020-02-15 NOTE — Progress Notes (Signed)
   Covid-19 Vaccination Clinic  Name:  BASSEM BERNASCONI    MRN: [id number] DOB: March 31, 1951  02/15/2020  Mr. Cumbie was observed post Covid-19 immunization for 15 minutes without incident. He was provided with Vaccine Information Sheet and instruction to access the V-Safe system.   Mr. Boehringer was instructed to call 911 with any severe reactions post vaccine: Marland Kitchen Difficulty breathing  . Swelling of face and throat  . A fast heartbeat  . A bad rash all over body  . Dizziness and weakness

## 2020-02-18 ENCOUNTER — Encounter: Payer: Self-pay | Admitting: Physician Assistant

## 2020-02-18 NOTE — Progress Notes (Signed)
Office Visit Note   Patient: [name]           Date of Birth: [birth year]           MRN: [id number] Visit Date: 02/15/2020              Requested by: No referring provider defined for this encounter. PCP: Patient, No Pcp Per  Chief Complaint  Patient presents with  . Right Leg - Follow-up  . Left Leg - Follow-up      HPI: Patient is here for follow up on his lower extremities  Assessment & Plan: Visit Diagnoses: No diagnosis found.  Plan: Patient only wanted to see Dr Sharol Given. Patient could not stay  For appointment. Will reschedule visit Follow-Up Instructions: No follow-ups on file.   Ortho Exam  Not done as patient had to leave  Imaging: No results found. No images are attached to the encounter.  Labs: Lab Results  Component Value Date   HGBA1C (H) 09/19/2010    6.4 (NOTE)                                                                       According to the ADA Clinical Practice Recommendations for 2011, when HbA1c is used as a screening test:   >=6.5%   Diagnostic of Diabetes Mellitus           (if abnormal result  is confirmed)  5.7-6.4%   Increased risk of developing Diabetes Mellitus  References:Diagnosis and Classification of Diabetes Mellitus,Diabetes IOEV,0350,09(FGHWE 1):S62-S69 and Standards of Medical Care in         Diabetes - 2011,Diabetes Care,2011,34  (Suppl 1):S11-S61.   HGBA1C (H) 10/30/2009    7.0 (NOTE)                                                                       According to the ADA Clinical Practice Recommendations for 2011, when HbA1c is used as a screening test:   >=6.5%   Diagnostic of Diabetes Mellitus           (if abnormal result  is confirmed)  5.7-6.4%   Increased risk of developing Diabetes Mellitus  References:Diagnosis and Classification of Diabetes Mellitus,Diabetes XHBZ,1696,78(LFYBO 1):S62-S69 and Standards of Medical Care in         Diabetes - 2011,Diabetes FBPZ,0258,52  (Suppl 1):S11-S61.   HGBA1C  09/29/2007     5.7 (NOTE)   The ADA recommends the following therapeutic goals for glycemic   control related to Hgb A1C measurement:   Goal of Therapy:   < 7.0% Hgb A1C   Action Suggested:  > 8.0% Hgb A1C   Ref:  Diabetes Care, 22, Suppl. 1, 1999   ESRSEDRATE 17 (H) 10/30/2009   CRP 4.3 (H) 10/30/2009   REPTSTATUS 12/19/2018 FINAL 12/17/2018   GRAMSTAIN NO WBC SEEN NO ORGANISMS SEEN  12/17/2018   CULT  12/17/2018    FEW NORMAL SKIN FLORA Performed at [facility], [place]  [address], [place], [place] [address]    LABORGA METHICILLIN RESISTANT STAPHYLOCOCCUS AUREUS 10/17/2007     Lab Results  Component Value Date   ALBUMIN 4.4 08/31/2015   ALBUMIN 4.1 01/27/2014   ALBUMIN 4.3 11/01/2013    No results found for: MG No results found for: VD25OH  No results found for: PREALBUMIN CBC EXTENDED Latest Ref Rng & Units 11/23/2019 01/29/2019 08/31/2015  WBC 3.8 - 10.8 Thousand/uL 6.4 - 5.5  RBC 4.20 - 5.80 Million/uL 4.91 - 4.85  HGB 13.2 - 17.1 g/dL 15.6 15.6 15.3  HCT 38 - 50 % 47.8 46.0 46.1  PLT 140 - 400 Thousand/uL 212 - 180  NEUTROABS 1,500 - 7,800 cells/uL 3,814 - -  LYMPHSABS 850 - 3,900 cells/uL 1,043 - -     Body mass index is 27.12 kg/m.  Orders:  No orders of the defined types were placed in this encounter.  No orders of the defined types were placed in this encounter.    Procedures: No procedures performed  Clinical Data: No additional findings.  ROS:  All other systems negative, except as noted in the HPI. Review of Systems  Objective: Vital Signs: Ht 6' (1.829 m)   Wt 200 lb (90.7 kg)   BMI 27.12 kg/m   Specialty Comments:  No specialty comments available.  PMFS History: Patient Active Problem List   Diagnosis Date Noted  . Bilateral primary osteoarthritis of knee 09/07/2019  . Hx of adenomatous colonic polyps 07/25/2015  . Chest pain 01/05/2014  . Coronary artery disease 02/28/2011  . Glucose intolerance (pre-diabetes) 02/28/2011  . ALCOHOL ABUSE  11/21/2009  . WOUND, FOOT 02/11/2009  . VENOUS INSUFFICIENCY, CHRONIC, RIGHT LEG 06/07/2008  . HYPERLIPIDEMIA 03/11/2007  . HYPERTENSION 03/11/2007  . LOW BACK PAIN 03/11/2007   Past Medical History:  Diagnosis Date  . Alcohol abuse   . CAD (coronary artery disease)    a. s/p CABG 2009 (Dr. Roxy Manns - LIMA-OM2, L radial - PDA, SVG-Dx) // b. Nuc 8/15: No ischemia, EF 65, normal study // c. Nuc 3/17: No significant reversible ischemia. LVEF 64% with normal wall motion. This is a low risk study.   . Carotid stenosis    Carotid US 3/19: Bilateral ICA 1-39  . DDD (degenerative disc disease)   . Gout   . HLD (hyperlipidemia)   . Hx of adenomatous colonic polyps 07/25/2015  . Hyperglycemia   . Hypertension   . Lumbago    (low back pain)   . Neuropathy   . PAF (paroxysmal atrial fibrillation) ([place])    Anticoag (previously on Xarelto) stopped in 2017 due to concerns over bleeding risk in the setting of heavy alcohol use)  . Paroxysmal atrial flutter ([place])    TEE 6/15: EF 50-55, no RWMA, mild MR, LAE, no LAA clot >> s/p DCCV to restore NSR  . S/P CABG x 1   . Sciatica     Family History  Problem Relation Age of Onset  . Coronary artery disease Unknown        family history  . Heart disease Father        CABG   . Heart attack Father   . Stroke Unknown        family history  . Colon cancer Neg Hx   . Colon polyps Neg Hx   . Esophageal cancer Neg Hx   . Rectal cancer Neg Hx   . Stomach cancer Neg Hx     Past Surgical History:  Procedure Laterality Date  .  ABDOMINAL AORTOGRAM W/LOWER EXTREMITY Bilateral 01/29/2019   Procedure: ABDOMINAL AORTOGRAM W/LOWER EXTREMITY;  Surgeon: Angelia Mould, MD;  Location: [place] [facility];  Service: Cardiovascular;  Laterality: Bilateral;  . CARDIAC CATHETERIZATION  4.17.09  . CARDIOVERSION N/A 11/03/2013   Procedure: CARDIOVERSION;  Surgeon: Dorothy Spark, MD;  Location: [facility] ENDOSCOPY;  Service: Cardiovascular;  Laterality: N/A;  .  COLONOSCOPY  10-01-2002   gessner-tics and hems   . CORONARY ARTERY BYPASS GRAFT  5.8.09   x3  . INGUINAL HERNIA REPAIR Left   . LAPAROSCOPIC INGUINAL HERNIA REPAIR Right   . TEE WITHOUT CARDIOVERSION N/A 11/03/2013   Procedure: TRANSESOPHAGEAL ECHOCARDIOGRAM (TEE);  Surgeon: Dorothy Spark, MD;  Location: [place];  Service: Cardiovascular;  Laterality: N/A;  . TONSILLECTOMY AND ADENOIDECTOMY    . WRIST SURGERY     Social History   Occupational History  . Not on file  Tobacco Use  . Smoking status: Never Smoker  . Smokeless tobacco: Never Used  Substance and Sexual Activity  . Alcohol use: Yes    Alcohol/week: 0.0 standard drinks    Comment: 6 beers per week  . Drug use: No  . Sexual activity: Not on file

## 2020-02-21 ENCOUNTER — Ambulatory Visit: Payer: Medicare Other | Admitting: Orthopedic Surgery

## 2020-02-22 ENCOUNTER — Ambulatory Visit: Payer: Self-pay

## 2020-02-22 ENCOUNTER — Ambulatory Visit: Payer: Medicare Other | Admitting: Orthopedic Surgery

## 2020-02-22 ENCOUNTER — Telehealth: Payer: Self-pay | Admitting: Orthopedic Surgery

## 2020-02-22 ENCOUNTER — Ambulatory Visit (INDEPENDENT_AMBULATORY_CARE_PROVIDER_SITE_OTHER): Payer: Medicare Other | Admitting: Orthopedic Surgery

## 2020-02-22 DIAGNOSIS — M5442 Lumbago with sciatica, left side: Secondary | ICD-10-CM | POA: Diagnosis not present

## 2020-02-22 DIAGNOSIS — M542 Cervicalgia: Secondary | ICD-10-CM | POA: Diagnosis not present

## 2020-02-22 MED ORDER — PREDNISONE 10 MG PO TABS
20.0000 mg | ORAL_TABLET | Freq: Every day | ORAL | 0 refills | Status: DC
Start: 2020-02-22 — End: 2020-03-15

## 2020-02-22 MED ORDER — HYDROCODONE-ACETAMINOPHEN 7.5-325 MG PO TABS: 1.0000 | ORAL_TABLET | Freq: Four times a day (QID) | ORAL | 0 refills | Status: AC | PRN
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
## 2020-02-26 ENCOUNTER — Encounter: Payer: Self-pay | Admitting: Orthopedic Surgery

## 2020-02-26 NOTE — Progress Notes (Signed)
Office Visit Note   Patient: [name]           Date of Birth: 05/22/1951           MRN: [id number] Visit Date: 02/22/2020              Requested by: No referring provider defined for this encounter. PCP: Patient, No Pcp Per  Chief Complaint  Patient presents with  . Right Leg - Follow-up  . Left Leg - Follow-up      HPI: Patient is a 69 year old gentleman who presents with a complaint of a recent fall complaining of neck and back and rib pain as well as knee pain.  Patient states that he fell a few days ago was on the floor for a couple of hours.  He states he still has chronic radicular pain down both lower extremities both upper extremities and pain in his ribs.  He also complains of bruising of the lateral aspect of the left knee.  Assessment & Plan: Visit Diagnoses:  1. Neck pain   2. Low back pain with left-sided sciatica, unspecified back pain laterality, unspecified chronicity     Plan: Patient is given a prescription for prednisone and Vicodin will reevaluate in 3 weeks and patient may require follow-up x-rays.  Follow-Up Instructions: Return in about 3 weeks (around 03/14/2020).   Ortho Exam  Patient is alert, oriented, no adenopathy, well-dressed, normal affect, normal respiratory effort. Examination patient has good range of motion of the cervical spine his cervical spine is nontender to palpation thoracic spine is nontender to palpation his lumbar spine is nontender to palpation.  He has full range of motion of both upper and lower extremities.  He does have an ulcer that has healed over the left ankle.  He has bruising the lateral aspect the left knee there is no effusion he has good active flexion and extension collaterals and cruciates are stable there is no effusion.  Imaging: No results found. No images are attached to the encounter.  Labs: Lab Results  Component Value Date   HGBA1C (H) 09/19/2010    6.4 (NOTE)                                                                        According to the ADA Clinical Practice Recommendations for 2011, when HbA1c is used as a screening test:   >=6.5%   Diagnostic of Diabetes Mellitus           (if abnormal result  is confirmed)  5.7-6.4%   Increased risk of developing Diabetes Mellitus  References:Diagnosis and Classification of Diabetes Mellitus,Diabetes KZSW,1093,23(FTDDU 1):S62-S69 and Standards of Medical Care in         Diabetes - 2011,Diabetes Care,2011,34  (Suppl 1):S11-S61.   HGBA1C (H) 10/30/2009    7.0 (NOTE)                                                                       According  to the ADA Clinical Practice Recommendations for 2011, when HbA1c is used as a screening test:   >=6.5%   Diagnostic of Diabetes Mellitus           (if abnormal result  is confirmed)  5.7-6.4%   Increased risk of developing Diabetes Mellitus  References:Diagnosis and Classification of Diabetes Mellitus,Diabetes OJJK,0938,18(EXHBZ 1):S62-S69 and Standards of Medical Care in         Diabetes - 2011,Diabetes JIRC,7893,81  (Suppl 1):S11-S61.   HGBA1C  09/29/2007    5.7 (NOTE)   The ADA recommends the following therapeutic goals for glycemic   control related to Hgb A1C measurement:   Goal of Therapy:   < 7.0% Hgb A1C   Action Suggested:  > 8.0% Hgb A1C   Ref:  Diabetes Care, 22, Suppl. 1, 1999   ESRSEDRATE 17 (H) 10/30/2009   CRP 4.3 (H) 10/30/2009   REPTSTATUS 12/19/2018 FINAL 12/17/2018   GRAMSTAIN NO WBC SEEN NO ORGANISMS SEEN  12/17/2018   CULT  12/17/2018    FEW NORMAL SKIN FLORA Performed at [facility], [place] [address], [place], [place] [address]    LABORGA METHICILLIN RESISTANT STAPHYLOCOCCUS AUREUS 10/17/2007     Lab Results  Component Value Date   ALBUMIN 4.4 08/31/2015   ALBUMIN 4.1 01/27/2014   ALBUMIN 4.3 11/01/2013    No results found for: MG No results found for: VD25OH  No results found for: PREALBUMIN CBC EXTENDED Latest Ref Rng & Units 11/23/2019 01/29/2019 08/31/2015    WBC 3.8 - 10.8 Thousand/uL 6.4 - 5.5  RBC 4.20 - 5.80 Million/uL 4.91 - 4.85  HGB 13.2 - 17.1 g/dL 15.6 15.6 15.3  HCT 38 - 50 % 47.8 46.0 46.1  PLT 140 - 400 Thousand/uL 212 - 180  NEUTROABS 1,500 - 7,800 cells/uL 3,814 - -  LYMPHSABS 850 - 3,900 cells/uL 1,043 - -     There is no height or weight on file to calculate BMI.  Orders:  No orders of the defined types were placed in this encounter.  Meds ordered this encounter  Medications  . predniSONE (DELTASONE) 10 MG tablet    Sig: Take 2 tablets (20 mg total) by mouth daily with breakfast.    Dispense:  60 tablet    Refill:  0  . HYDROcodone-acetaminophen (NORCO) 7.5-325 MG tablet    Sig: Take 1 tablet by mouth every 6 (six) hours as needed for moderate pain.    Dispense:  20 tablet    Refill:  0     Procedures: No procedures performed  Clinical Data: No additional findings.  ROS:  All other systems negative, except as noted in the HPI. Review of Systems  Objective: Vital Signs: There were no vitals taken for this visit.  Specialty Comments:  No specialty comments available.  PMFS History: Patient Active Problem List   Diagnosis Date Noted  . Bilateral primary osteoarthritis of knee 09/07/2019  . Hx of adenomatous colonic polyps 07/25/2015  . Chest pain 01/05/2014  . Coronary artery disease 02/28/2011  . Glucose intolerance (pre-diabetes) 02/28/2011  . ALCOHOL ABUSE 11/21/2009  . WOUND, FOOT 02/11/2009  . VENOUS INSUFFICIENCY, CHRONIC, RIGHT LEG 06/07/2008  . HYPERLIPIDEMIA 03/11/2007  . HYPERTENSION 03/11/2007  . LOW BACK PAIN 03/11/2007   Past Medical History:  Diagnosis Date  . Alcohol abuse   . CAD (coronary artery disease)    a. s/p CABG 2009 (Dr. Roxy Manns - LIMA-OM2, L radial - PDA, SVG-Dx) // b. Nuc 8/15: No ischemia, EF  65, normal study // c. Nuc 3/17: No significant reversible ischemia. LVEF 64% with normal wall motion. This is a low risk study.   . Carotid stenosis    Carotid US 3/19:  Bilateral ICA 1-39  . DDD (degenerative disc disease)   . Gout   . HLD (hyperlipidemia)   . Hx of adenomatous colonic polyps 07/25/2015  . Hyperglycemia   . Hypertension   . Lumbago    (low back pain)   . Neuropathy   . PAF (paroxysmal atrial fibrillation) ([place])    Anticoag (previously on Xarelto) stopped in 2017 due to concerns over bleeding risk in the setting of heavy alcohol use)  . Paroxysmal atrial flutter ([place])    TEE 6/15: EF 50-55, no RWMA, mild MR, LAE, no LAA clot >> s/p DCCV to restore NSR  . S/P CABG x 1   . Sciatica     Family History  Problem Relation Age of Onset  . Coronary artery disease Unknown        family history  . Heart disease Father        CABG   . Heart attack Father   . Stroke Unknown        family history  . Colon cancer Neg Hx   . Colon polyps Neg Hx   . Esophageal cancer Neg Hx   . Rectal cancer Neg Hx   . Stomach cancer Neg Hx     Past Surgical History:  Procedure Laterality Date  . ABDOMINAL AORTOGRAM W/LOWER EXTREMITY Bilateral 01/29/2019   Procedure: ABDOMINAL AORTOGRAM W/LOWER EXTREMITY;  Surgeon: Angelia Mould, MD;  Location: [place] [facility];  Service: Cardiovascular;  Laterality: Bilateral;  . CARDIAC CATHETERIZATION  4.17.09  . CARDIOVERSION N/A 11/03/2013   Procedure: CARDIOVERSION;  Surgeon: Dorothy Spark, MD;  Location: [facility] ENDOSCOPY;  Service: Cardiovascular;  Laterality: N/A;  . COLONOSCOPY  10-01-2002   gessner-tics and hems   . CORONARY ARTERY BYPASS GRAFT  5.8.09   x3  . INGUINAL HERNIA REPAIR Left   . LAPAROSCOPIC INGUINAL HERNIA REPAIR Right   . TEE WITHOUT CARDIOVERSION N/A 11/03/2013   Procedure: TRANSESOPHAGEAL ECHOCARDIOGRAM (TEE);  Surgeon: Dorothy Spark, MD;  Location: [place];  Service: Cardiovascular;  Laterality: N/A;  . TONSILLECTOMY AND ADENOIDECTOMY    . WRIST SURGERY     Social History   Occupational History  . Not on file  Tobacco Use  . Smoking status: Never Smoker  . Smokeless  tobacco: Never Used  Substance and Sexual Activity  . Alcohol use: Yes    Alcohol/week: 0.0 standard drinks    Comment: 6 beers per week  . Drug use: No  . Sexual activity: Not on file

## 2020-03-14 ENCOUNTER — Ambulatory Visit: Payer: Self-pay

## 2020-03-14 ENCOUNTER — Ambulatory Visit (INDEPENDENT_AMBULATORY_CARE_PROVIDER_SITE_OTHER): Payer: Medicare Other | Admitting: Orthopedic Surgery

## 2020-03-14 DIAGNOSIS — M25512 Pain in left shoulder: Secondary | ICD-10-CM | POA: Diagnosis not present

## 2020-03-14 DIAGNOSIS — M542 Cervicalgia: Secondary | ICD-10-CM

## 2020-03-14 DIAGNOSIS — M25562 Pain in left knee: Secondary | ICD-10-CM | POA: Diagnosis not present

## 2020-03-14 DIAGNOSIS — M5442 Lumbago with sciatica, left side: Secondary | ICD-10-CM | POA: Diagnosis not present

## 2020-03-14 MED ORDER — METHOCARBAMOL 500 MG PO TABS: 500.0000 mg | ORAL_TABLET | Freq: Three times a day (TID) | ORAL | 0 refills | Status: AC

## 2020-03-15 ENCOUNTER — Other Ambulatory Visit: Payer: Self-pay | Admitting: Orthopedic Surgery

## 2020-03-15 NOTE — Telephone Encounter (Signed)
Pt was in the office yesterday. Did you want to give rx refill on prednisone?

## 2020-03-20 ENCOUNTER — Encounter: Payer: Self-pay | Admitting: Orthopedic Surgery

## 2020-03-20 NOTE — Progress Notes (Signed)
Office Visit Note   Patient: [name]           Date of Birth: 1950/12/23           MRN: [id number] Visit Date: 03/14/2020              Requested by: No referring provider defined for this encounter. PCP: Patient, No Pcp Per  Chief Complaint  Patient presents with  . Left Leg - Follow-up  . Spine - Follow-up      HPI: Patient is a 69 year old gentleman who presents status post fall complaining of pain in his neck back left shoulder and left knee. Patient has tried low-dose prednisone without relief. Patient also has ulcerations of the tip of his toes.  Assessment & Plan: Visit Diagnoses:  1. Neck pain   2. Low back pain with left-sided sciatica, unspecified back pain laterality, unspecified chronicity   3. Left shoulder pain, unspecified chronicity   4. Left knee pain, unspecified chronicity     Plan: Recommend patient wear new balance shoe to prevent his foot from sliding into the end of his shoes. Patient is currently wearing crocs. Patient is given a prescription for Robaxin recommended heat for his neck and back.  Follow-Up Instructions: Return in about 2 weeks (around 03/28/2020).   Ortho Exam  Patient is alert, oriented, no adenopathy, well-dressed, normal affect, normal respiratory effort. Examination patient has pain with range of motion of the left shoulder the joint moves congruently. Radiograph shows no fractures. Patient complains of cervical and lumbar spine pain along the midline has no radicular symptoms no focal motor weakness in either upper or lower extremities. Patient has venous stasis ulcers in the left leg with good granulation tissue recommended he wear his compression stockings. Patient has ulcers of the tip of the 2nd toe bilaterally from sliding into the end of his croc shoes.  Imaging: No results found. No images are attached to the encounter.  Labs: Lab Results  Component Value Date   HGBA1C (H) 09/19/2010    6.4 (NOTE)                                                                        According to the ADA Clinical Practice Recommendations for 2011, when HbA1c is used as a screening test:   >=6.5%   Diagnostic of Diabetes Mellitus           (if abnormal result  is confirmed)  5.7-6.4%   Increased risk of developing Diabetes Mellitus  References:Diagnosis and Classification of Diabetes Mellitus,Diabetes GBTD,1761,60(VPXTG 1):S62-S69 and Standards of Medical Care in         Diabetes - 2011,Diabetes Care,2011,34  (Suppl 1):S11-S61.   HGBA1C (H) 10/30/2009    7.0 (NOTE)                                                                       According to the ADA Clinical Practice Recommendations for 2011, when HbA1c is used as  a screening test:   >=6.5%   Diagnostic of Diabetes Mellitus           (if abnormal result  is confirmed)  5.7-6.4%   Increased risk of developing Diabetes Mellitus  References:Diagnosis and Classification of Diabetes Mellitus,Diabetes UDJS,9702,63(ZCHYI 1):S62-S69 and Standards of Medical Care in         Diabetes - 2011,Diabetes FOYD,7412,87  (Suppl 1):S11-S61.   HGBA1C  09/29/2007    5.7 (NOTE)   The ADA recommends the following therapeutic goals for glycemic   control related to Hgb A1C measurement:   Goal of Therapy:   < 7.0% Hgb A1C   Action Suggested:  > 8.0% Hgb A1C   Ref:  Diabetes Care, 22, Suppl. 1, 1999   ESRSEDRATE 17 (H) 10/30/2009   CRP 4.3 (H) 10/30/2009   REPTSTATUS 12/19/2018 FINAL 12/17/2018   GRAMSTAIN NO WBC SEEN NO ORGANISMS SEEN  12/17/2018   CULT  12/17/2018    FEW NORMAL SKIN FLORA Performed at [facility], [place] [address] [place], [place] [address]    LABORGA METHICILLIN RESISTANT STAPHYLOCOCCUS AUREUS 10/17/2007     Lab Results  Component Value Date   ALBUMIN 4.4 08/31/2015   ALBUMIN 4.1 01/27/2014   ALBUMIN 4.3 11/01/2013    No results found for: MG No results found for: VD25OH  No results found for: PREALBUMIN CBC EXTENDED Latest Ref Rng & Units 11/23/2019  01/29/2019 08/31/2015  WBC 3.8 - 10.8 Thousand/uL 6.4 - 5.5  RBC 4.20 - 5.80 Million/uL 4.91 - 4.85  HGB 13.2 - 17.1 g/dL 15.6 15.6 15.3  HCT 38 - 50 % 47.8 46.0 46.1  PLT 140 - 400 Thousand/uL 212 - 180  NEUTROABS 1,500 - 7,800 cells/uL 3,814 - -  LYMPHSABS 850 - 3,900 cells/uL 1,043 - -     There is no height or weight on file to calculate BMI.  Orders:  Orders Placed This Encounter  Procedures  . XR Knee 1-2 Views Left  . XR Shoulder Left  . XR Lumbar Spine 2-3 Views  . XR Cervical Spine 2 or 3 views   Meds ordered this encounter  Medications  . methocarbamol (ROBAXIN) 500 MG tablet    Sig: Take 1 tablet (500 mg total) by mouth 3 (three) times daily.    Dispense:  30 tablet    Refill:  0     Procedures: No procedures performed  Clinical Data: No additional findings.  ROS:  All other systems negative, except as noted in the HPI. Review of Systems  Objective: Vital Signs: There were no vitals taken for this visit.  Specialty Comments:  No specialty comments available.  PMFS History: Patient Active Problem List   Diagnosis Date Noted  . Bilateral primary osteoarthritis of knee 09/07/2019  . Hx of adenomatous colonic polyps 07/25/2015  . Chest pain 01/05/2014  . Coronary artery disease 02/28/2011  . Glucose intolerance (pre-diabetes) 02/28/2011  . ALCOHOL ABUSE 11/21/2009  . WOUND, FOOT 02/11/2009  . VENOUS INSUFFICIENCY, CHRONIC, RIGHT LEG 06/07/2008  . HYPERLIPIDEMIA 03/11/2007  . HYPERTENSION 03/11/2007  . LOW BACK PAIN 03/11/2007   Past Medical History:  Diagnosis Date  . Alcohol abuse   . CAD (coronary artery disease)    a. s/p CABG 2009 (Dr. Roxy Manns - LIMA-OM2, L radial - PDA, SVG-Dx) // b. Nuc 8/15: No ischemia, EF 65, normal study // c. Nuc 3/17: No significant reversible ischemia. LVEF 64% with normal wall motion. This is a low risk study.   . Carotid  stenosis    Carotid US 3/19: Bilateral ICA 1-39  . DDD (degenerative disc disease)   . Gout     . HLD (hyperlipidemia)   . Hx of adenomatous colonic polyps 07/25/2015  . Hyperglycemia   . Hypertension   . Lumbago    (low back pain)   . Neuropathy   . PAF (paroxysmal atrial fibrillation) ([place])    Anticoag (previously on Xarelto) stopped in 2017 due to concerns over bleeding risk in the setting of heavy alcohol use)  . Paroxysmal atrial flutter ([place])    TEE 6/15: EF 50-55, no RWMA, mild MR, LAE, no LAA clot >> s/p DCCV to restore NSR  . S/P CABG x 1   . Sciatica     Family History  Problem Relation Age of Onset  . Coronary artery disease Unknown        family history  . Heart disease Father        CABG   . Heart attack Father   . Stroke Unknown        family history  . Colon cancer Neg Hx   . Colon polyps Neg Hx   . Esophageal cancer Neg Hx   . Rectal cancer Neg Hx   . Stomach cancer Neg Hx     Past Surgical History:  Procedure Laterality Date  . ABDOMINAL AORTOGRAM W/LOWER EXTREMITY Bilateral 01/29/2019   Procedure: ABDOMINAL AORTOGRAM W/LOWER EXTREMITY;  Surgeon: Angelia Mould, MD;  Location: [place] [facility];  Service: Cardiovascular;  Laterality: Bilateral;  . CARDIAC CATHETERIZATION  4.17.09  . CARDIOVERSION N/A 11/03/2013   Procedure: CARDIOVERSION;  Surgeon: Dorothy Spark, MD;  Location: [facility] ENDOSCOPY;  Service: Cardiovascular;  Laterality: N/A;  . COLONOSCOPY  10-01-2002   gessner-tics and hems   . CORONARY ARTERY BYPASS GRAFT  5.8.09   x3  . INGUINAL HERNIA REPAIR Left   . LAPAROSCOPIC INGUINAL HERNIA REPAIR Right   . TEE WITHOUT CARDIOVERSION N/A 11/03/2013   Procedure: TRANSESOPHAGEAL ECHOCARDIOGRAM (TEE);  Surgeon: Dorothy Spark, MD;  Location: [place];  Service: Cardiovascular;  Laterality: N/A;  . TONSILLECTOMY AND ADENOIDECTOMY    . WRIST SURGERY     Social History   Occupational History  . Not on file  Tobacco Use  . Smoking status: Never Smoker  . Smokeless tobacco: Never Used  Substance and Sexual Activity  . Alcohol use:  Yes    Alcohol/week: 0.0 standard drinks    Comment: 6 beers per week  . Drug use: No  . Sexual activity: Not on file

## 2020-03-28 ENCOUNTER — Ambulatory Visit: Payer: Medicare Other | Admitting: Orthopedic Surgery

## 2020-04-06 ENCOUNTER — Ambulatory Visit: Payer: Medicare Other | Admitting: Orthopedic Surgery

## 2020-04-13 IMAGING — MR MR ANKLE*L* WO/W CM
4 of 7 series · 18 of 40 positions shown · IV contrast (gadavist)
Comparison: Radiographs 05/26/2019

CLINICAL DATA: Lateral nonhealing ulcer of the left ankle

EXAM:
MRI OF THE LEFT ANKLE WITHOUT AND WITH CONTRAST
TECHNIQUE: Multiplanar, multisequence MR imaging of the ankle was performed
before and after the administration of intravenous contrast.
CONTRAST:  9mL GADAVIST GADOBUTROL 1 MMOL/ML IV SOLN

[Series 3: T2 fat-sat · axial · 3.0mm · 0.31mm/px · z∈[-80,+89]mm · 6 of 44 slices shown (1 of 2)]
[im 1/44]
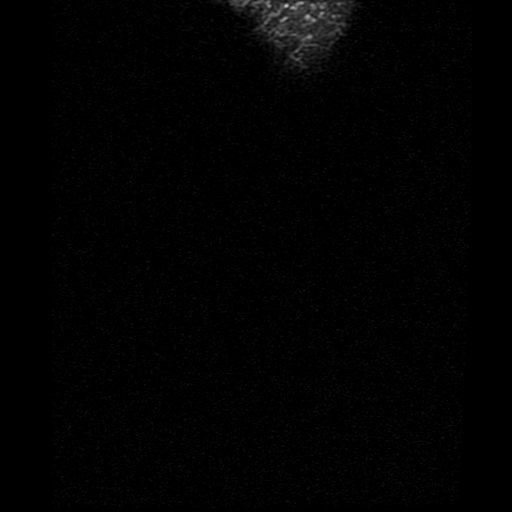
[im 9/44]
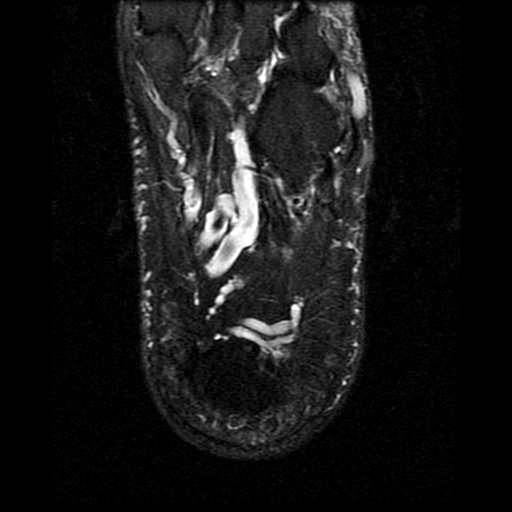
[im 18/44]
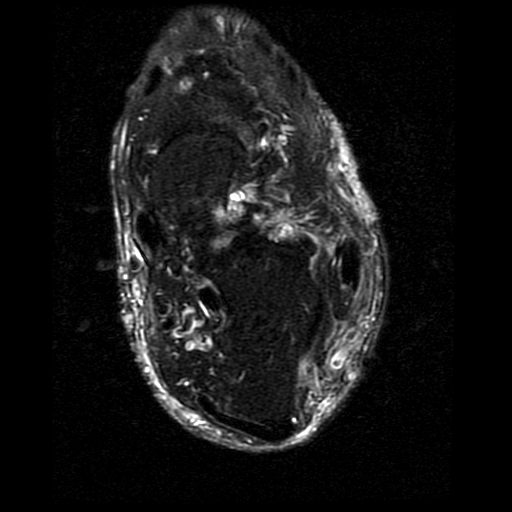
[im 26/44]
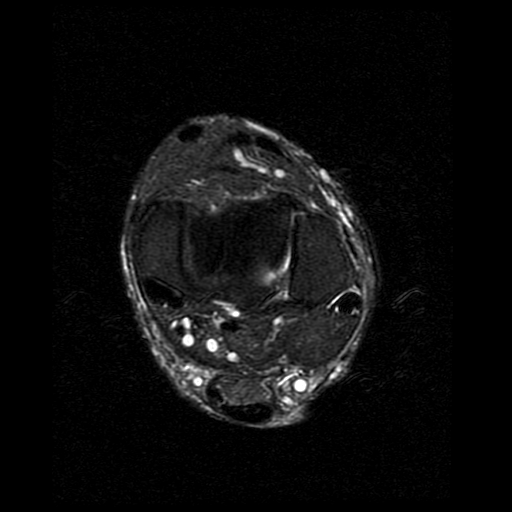
[im 35/44]
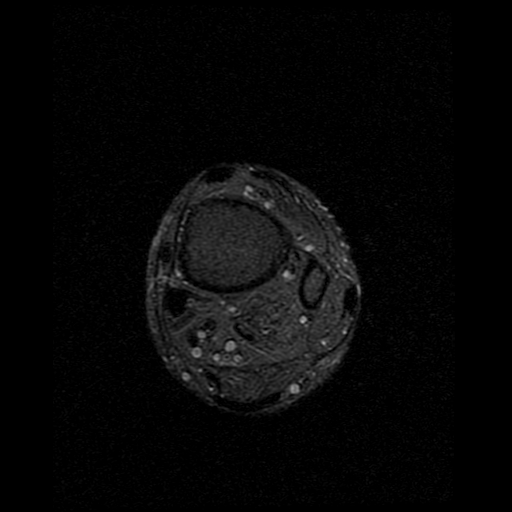
[im 44/44]
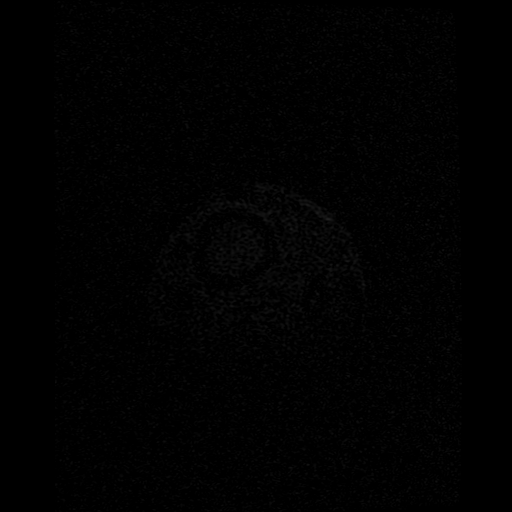

[Series 4: T2 fat-sat · coronal · 3.0mm · 0.33mm/px · 4 of 35 slices shown (2 of 2)]
[im 1/35]
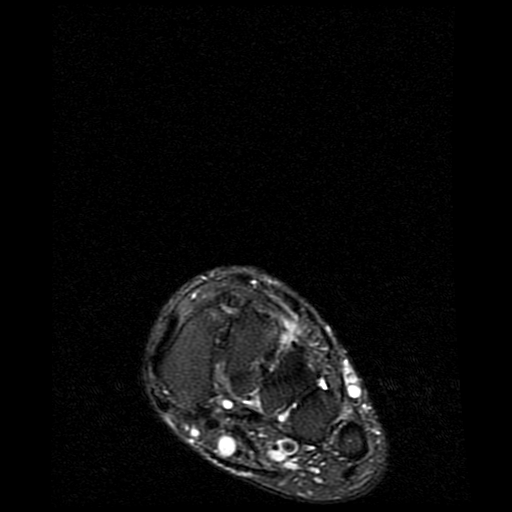
[im 12/35]
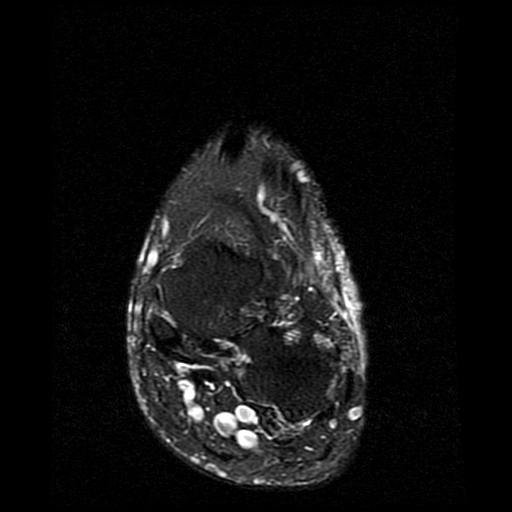
[im 23/35]
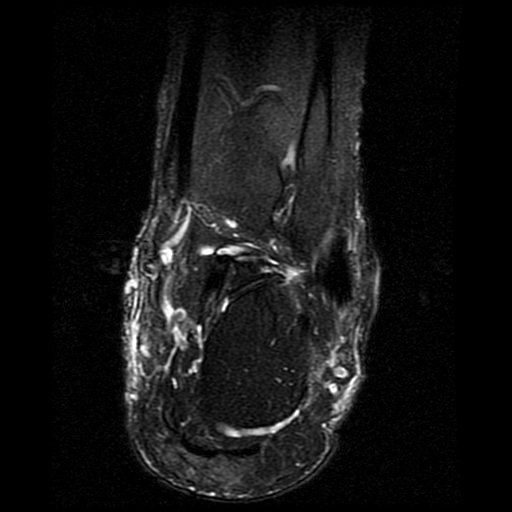
[im 35/35]
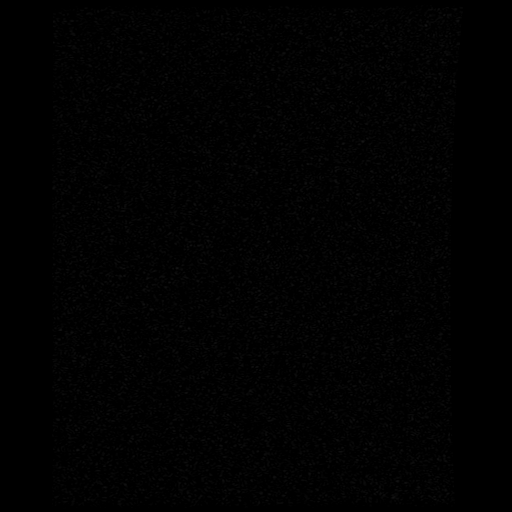

[Series 5: T1 · axial · 3.0mm · 0.31mm/px · z∈[-80,+57]mm · 5 of 44 slices shown (1 of 2)]
[im 1/44]
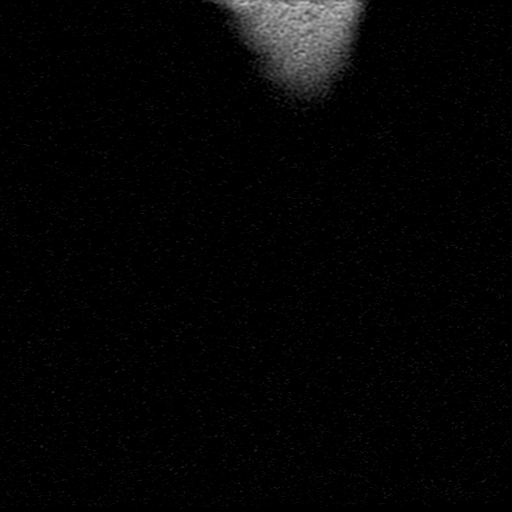
[im 8/44]
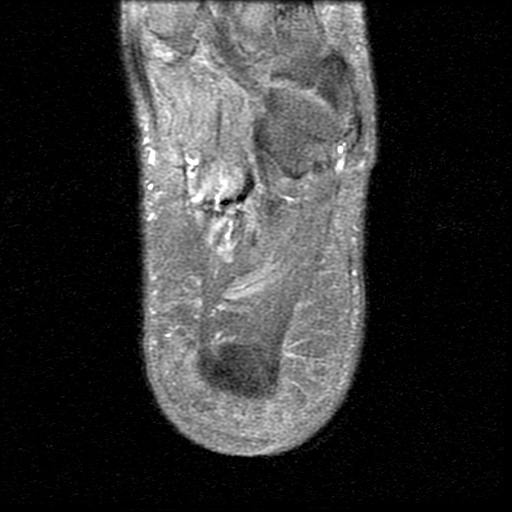
[im 15/44]
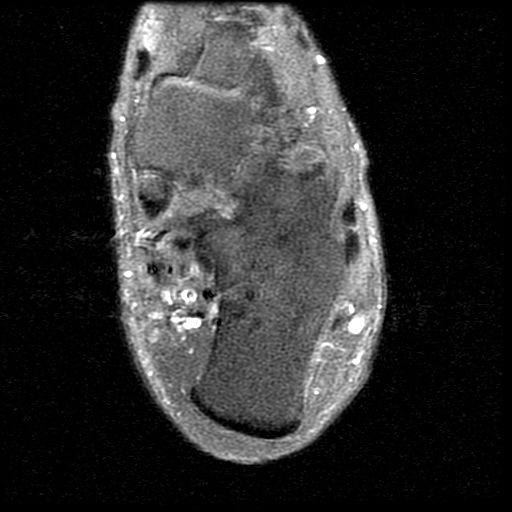
[im 22/44]
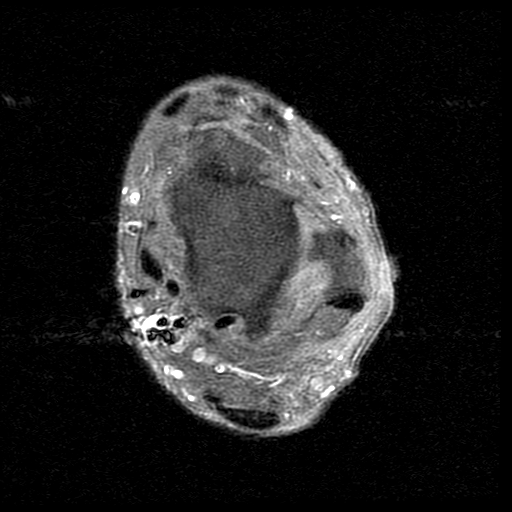
[im 36/44]
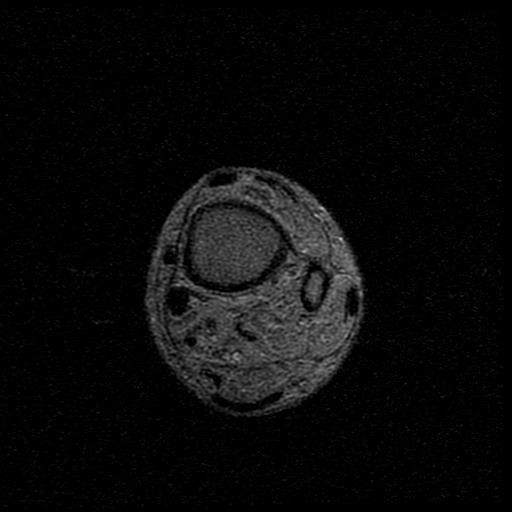

[Series 7: T1 · axial · 3.0mm · 0.31mm/px · z∈[-53,+57]mm · 3 of 44 slices shown (2 of 2)]
[im 8/44]
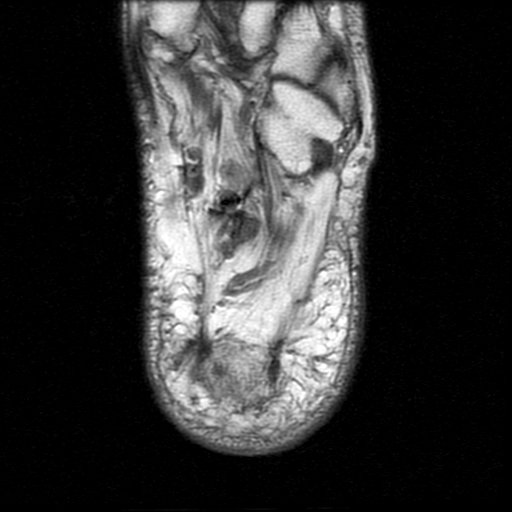
[im 22/44]
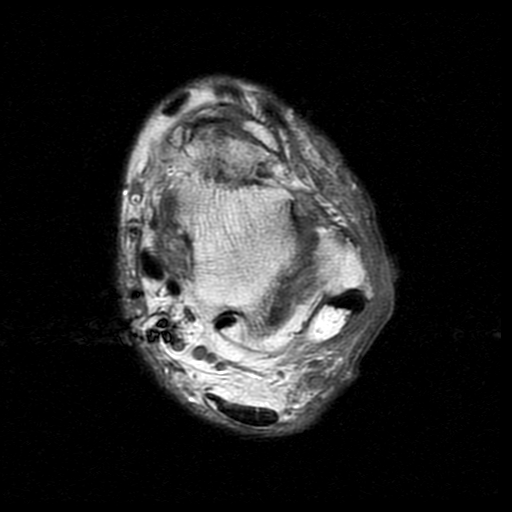
[im 36/44]
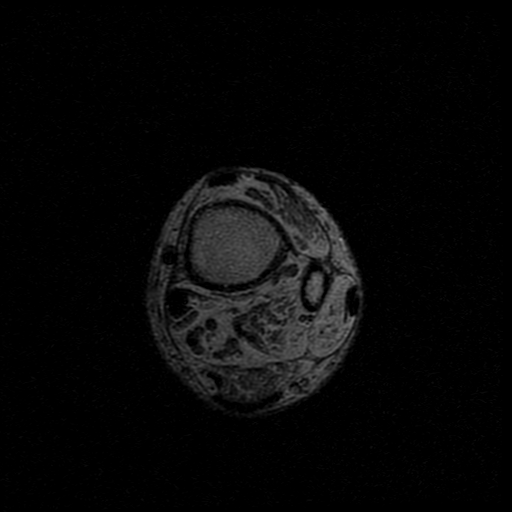

[18 of 40 positions shown; findings below may reference images not displayed]

FINDINGS: TENDONS

Peroneal: Mild longitudinal partial tearing of the peroneus brevis
tendon adjacent to the lateral malleolus. The peroneus longus
appears intact.

Posteromedial: Unremarkable

Anterior: Unremarkable

Achilles: Unremarkable

Plantar Fascia: Unremarkable

LIGAMENTS

Lateral: Mildly thickened but otherwise intact anterior talofibular
ligament. Otherwise normal.

Medial: Unremarkable

CARTILAGE

Ankle Joint: Mild degenerative chondral thinning.

Subtalar Joints/Sinus Tarsi: Moderate chondral thinning along the
posterior subtalar facet with more striking chondral thinning and
subcortical marrow edema along the middle subtalar facet.

Bones: Degenerative subcortical marrow edema at the calcaneocuboid
articulation with loss of articular space. Degenerative findings in
the midfoot with multifocal subcortical marrow edema and associated
spurring. Likewise there is degenerative arthropathy at the Lisfranc
joint.

Laterally along the distal fibula and in the vicinity of the
ulceration, there is very subtle localized subcortical marrow edema
and enhancement for example on image [DATE] and image [DATE], along
with a suggestion of mild periosteal reaction. Given the subtlety of
findings, this appearance could simply be reactive rather than
necessarily being from early osteomyelitis. No overtly destructive
bony findings.

Other: Ulceration overlying the lateral malleolus as shown on image
[DATE] with surrounding enhancement in the subcutaneous tissues
potentially from localized cellulitis.
IMPRESSION: IMPRESSION
1. Ulceration overlying the lateral malleolus, with adjacent faint
periosteal reaction and minimal focal edema and enhancement in the
lateral malleolus. The subtlety of bony findings raises the
possibility that these findings are simply reactive rather than
necessarily representing early active osteomyelitis.
2. Longitudinal partial tearing of the peroneus brevis tendon
adjacent to the lateral malleolus.
3. Mildly thickened otherwise intact ATFL.
4. Considerable degenerative arthropathy in the hindfoot and
midfoot.

## 2020-04-25 ENCOUNTER — Ambulatory Visit: Payer: Medicare Other | Admitting: Orthopedic Surgery

## 2020-04-26 ENCOUNTER — Telehealth: Payer: Self-pay

## 2020-04-26 NOTE — Telephone Encounter (Signed)
I called pt and lm on vm to advise that he needs to call back with some furtrh details. The last time we sent in abx for him was in august. He had an appt yesterday that he cancelled and that is he is worried that he has an infection I need to know where and of what. To call back will hold message pending a return call.

## 2020-04-26 NOTE — Telephone Encounter (Signed)
Patient called in wanting to get hsi temp check at the door when he comes into his appt on 12/1 . Says he has a germ but just wants to be checked before coming into lobby.   Patient also said he has an infection and would like to be prescribed antibiotics again. Send to same pharmacy

## 2020-05-01 NOTE — Telephone Encounter (Signed)
Pt scheduled appt for this week  05/04/20

## 2020-05-04 ENCOUNTER — Ambulatory Visit (INDEPENDENT_AMBULATORY_CARE_PROVIDER_SITE_OTHER): Payer: Medicare Other | Admitting: Orthopedic Surgery

## 2020-05-04 ENCOUNTER — Other Ambulatory Visit: Payer: Self-pay

## 2020-05-04 VITALS — Wt 200.0 lb

## 2020-05-04 DIAGNOSIS — L97514 Non-pressure chronic ulcer of other part of right foot with necrosis of bone: Secondary | ICD-10-CM

## 2020-05-04 MED ORDER — DOXYCYCLINE HYCLATE 100 MG PO TABS: 100.0000 mg | ORAL_TABLET | Freq: Two times a day (BID) | ORAL | 0 refills | Status: DC

## 2020-05-09 ENCOUNTER — Encounter: Payer: Self-pay | Admitting: Orthopedic Surgery

## 2020-05-09 NOTE — Progress Notes (Signed)
Office Visit Note   Patient: [name]           Date of Birth: [birth year]           MRN: [id number] Visit Date: 05/04/2020              Requested by: No referring provider defined for this encounter. PCP: Patient, No Pcp Per  Chief Complaint  Patient presents with  . Left Ankle - Wound Check      HPI: Patient is a 69 year old gentleman who presents in follow-up for both lower extremities patient complains of swelling and ulceration to the tip of the right second toe.  Patient also states he has chronic lower back pain.  Patient has arterial insufficiency with arteriogram showing single-vessel runoff to both lower extremities.  Vascular surgery is recommended against any vascular intervention with the potential risk of injury to his single-vessel runoff.  Patient has a ischemic ulcer over the left lateral malleolus.  Assessment & Plan: Visit Diagnoses:  1. Ulcer of right second toe, with necrosis of bone (HCC)     Plan: Recommended exercise for his chronic lower back pain prescription for doxycycline is called in for the second toe.  Patient states he does not want to consider any type of surgical intervention for the second toe.  Patient was given postoperative shoes x2 he has developed pressure ulcers on both feet from tight shoe wear.  A prescription is called into [facility] for [facility]cycline.  Follow-Up Instructions: Return in about 2 weeks (around 05/18/2020).   Ortho Exam  Patient is alert, oriented, no adenopathy, well-dressed, normal affect, normal respiratory effort. Examination patient has a stable ulcer over the left lateral malleolus.  No cellulitis no drainage no exposed bone or tendon.  Examination of the right second toe was exposed bone was sausage digit swelling consistent with osteomyelitis.  Patient has pressure ulcers on both feet from tight shoe wear.  Imaging: No results found. No images are attached to the encounter.  Labs: Lab Results  Component Value  Date   HGBA1C (H) 09/19/2010    6.4 (NOTE)                                                                       According to the ADA Clinical Practice Recommendations for 2011, when HbA1c is used as a screening test:   >=6.5%   Diagnostic of Diabetes Mellitus           (if abnormal result  is confirmed)  5.7-6.4%   Increased risk of developing Diabetes Mellitus  References:Diagnosis and Classification of Diabetes Mellitus,Diabetes TRRN,1657,90(XYBFX 1):S62-S69 and Standards of Medical Care in         Diabetes - 2011,Diabetes Care,2011,34  (Suppl 1):S11-S61.   HGBA1C (H) 10/30/2009    7.0 (NOTE)                                                                       According to the [place]  Recommendations for 2011, when HbA1c is used as a screening test:   >=6.5%   Diagnostic of Diabetes Mellitus           (if abnormal result  is confirmed)  5.7-6.4%   Increased risk of developing Diabetes Mellitus  References:Diagnosis and Classification of Diabetes Mellitus,Diabetes TKPT,4656,81(EXNTZ 1):S62-S69 and Standards of Medical Care in         Diabetes - 2011,Diabetes GYFV,4944,96  (Suppl 1):S11-S61.   HGBA1C  09/29/2007    5.7 (NOTE)   The ADA recommends the following therapeutic goals for glycemic   control related to Hgb A1C measurement:   Goal of Therapy:   < 7.0% Hgb A1C   Action Suggested:  > 8.0% Hgb A1C   Ref:  Diabetes Care, 22, Suppl. 1, 1999   ESRSEDRATE 17 (H) 10/30/2009   CRP 4.3 (H) 10/30/2009   REPTSTATUS 12/19/2018 FINAL 12/17/2018   GRAMSTAIN NO WBC SEEN NO ORGANISMS SEEN  12/17/2018   CULT  12/17/2018    FEW NORMAL SKIN FLORA Performed at [facility], [place] [address] [place], [place] [address]    LABORGA METHICILLIN RESISTANT STAPHYLOCOCCUS AUREUS 10/17/2007     Lab Results  Component Value Date   ALBUMIN 4.4 08/31/2015   ALBUMIN 4.1 01/27/2014   ALBUMIN 4.3 11/01/2013    No results found for: MG No results found for: VD25OH  No results found for:  PREALBUMIN CBC EXTENDED Latest Ref Rng & Units 11/23/2019 01/29/2019 08/31/2015  WBC 3.8 - 10.8 Thousand/uL 6.4 - 5.5  RBC 4.20 - 5.80 Million/uL 4.91 - 4.85  HGB 13.2 - 17.1 g/dL 15.6 15.6 15.3  HCT 38 - 50 % 47.8 46.0 46.1  PLT 140 - 400 Thousand/uL 212 - 180  NEUTROABS 1,500 - 7,800 cells/uL 3,814 - -  LYMPHSABS 850 - 3,900 cells/uL 1,043 - -     Body mass index is 27.12 kg/m.  Orders:  No orders of the defined types were placed in this encounter.  Meds ordered this encounter  Medications  . doxycycline (VIBRA-TABS) 100 MG tablet    Sig: Take 1 tablet (100 mg total) by mouth 2 (two) times daily.    Dispense:  60 tablet    Refill:  0     Procedures: No procedures performed  Clinical Data: No additional findings.  ROS:  All other systems negative, except as noted in the HPI. Review of Systems  Objective: Vital Signs: Wt 200 lb (90.7 kg)   BMI 27.12 kg/m   Specialty Comments:  No specialty comments available.  PMFS History: Patient Active Problem List   Diagnosis Date Noted  . Bilateral primary osteoarthritis of knee 09/07/2019  . Hx of adenomatous colonic polyps 07/25/2015  . Chest pain 01/05/2014  . Coronary artery disease 02/28/2011  . Glucose intolerance (pre-diabetes) 02/28/2011  . ALCOHOL ABUSE 11/21/2009  . WOUND, FOOT 02/11/2009  . VENOUS INSUFFICIENCY, CHRONIC, RIGHT LEG 06/07/2008  . HYPERLIPIDEMIA 03/11/2007  . HYPERTENSION 03/11/2007  . LOW BACK PAIN 03/11/2007   Past Medical History:  Diagnosis Date  . Alcohol abuse   . CAD (coronary artery disease)    a. s/p CABG 2009 (Dr. Roxy Manns - LIMA-OM2, L radial - PDA, SVG-Dx) // b. Nuc 8/15: No ischemia, EF 65, normal study // c. Nuc 3/17: No significant reversible ischemia. LVEF 64% with normal wall motion. This is a low risk study.   . Carotid stenosis    Carotid US 3/19: Bilateral ICA 1-39  . DDD (degenerative disc disease)   .  Gout   . HLD (hyperlipidemia)   . Hx of adenomatous colonic polyps  07/25/2015  . Hyperglycemia   . Hypertension   . Lumbago    (low back pain)   . Neuropathy   . PAF (paroxysmal atrial fibrillation) ([place])    Anticoag (previously on Xarelto) stopped in 2017 due to concerns over bleeding risk in the setting of heavy alcohol use)  . Paroxysmal atrial flutter ([place])    TEE 6/15: EF 50-55, no RWMA, mild MR, LAE, no LAA clot >> s/p DCCV to restore NSR  . S/P CABG x 1   . Sciatica     Family History  Problem Relation Age of Onset  . Coronary artery disease Unknown        family history  . Heart disease Father        CABG   . Heart attack Father   . Stroke Unknown        family history  . Colon cancer Neg Hx   . Colon polyps Neg Hx   . Esophageal cancer Neg Hx   . Rectal cancer Neg Hx   . Stomach cancer Neg Hx     Past Surgical History:  Procedure Laterality Date  . ABDOMINAL AORTOGRAM W/LOWER EXTREMITY Bilateral 01/29/2019   Procedure: ABDOMINAL AORTOGRAM W/LOWER EXTREMITY;  Surgeon: Angelia Mould, MD;  Location: [place] [facility];  Service: Cardiovascular;  Laterality: Bilateral;  . CARDIAC CATHETERIZATION  4.17.09  . CARDIOVERSION N/A 11/03/2013   Procedure: CARDIOVERSION;  Surgeon: Dorothy Spark, MD;  Location: [facility] ENDOSCOPY;  Service: Cardiovascular;  Laterality: N/A;  . COLONOSCOPY  10-01-2002   gessner-tics and hems   . CORONARY ARTERY BYPASS GRAFT  5.8.09   x3  . INGUINAL HERNIA REPAIR Left   . LAPAROSCOPIC INGUINAL HERNIA REPAIR Right   . TEE WITHOUT CARDIOVERSION N/A 11/03/2013   Procedure: TRANSESOPHAGEAL ECHOCARDIOGRAM (TEE);  Surgeon: Dorothy Spark, MD;  Location: [place];  Service: Cardiovascular;  Laterality: N/A;  . TONSILLECTOMY AND ADENOIDECTOMY    . WRIST SURGERY     Social History   Occupational History  . Not on file  Tobacco Use  . Smoking status: Never Smoker  . Smokeless tobacco: Never Used  Substance and Sexual Activity  . Alcohol use: Yes    Alcohol/week: 0.0 standard drinks    Comment: 6 beers  per week  . Drug use: No  . Sexual activity: Not on file

## 2020-05-22 ENCOUNTER — Ambulatory Visit: Payer: Medicare Other | Admitting: Orthopedic Surgery

## 2020-05-23 ENCOUNTER — Ambulatory Visit (INDEPENDENT_AMBULATORY_CARE_PROVIDER_SITE_OTHER): Payer: Medicare Other | Admitting: Orthopedic Surgery

## 2020-05-23 ENCOUNTER — Encounter: Payer: Self-pay | Admitting: Orthopedic Surgery

## 2020-05-23 DIAGNOSIS — I87332 Chronic venous hypertension (idiopathic) with ulcer and inflammation of left lower extremity: Secondary | ICD-10-CM

## 2020-05-23 DIAGNOSIS — L97929 Non-pressure chronic ulcer of unspecified part of left lower leg with unspecified severity: Secondary | ICD-10-CM | POA: Diagnosis not present

## 2020-05-23 DIAGNOSIS — L97514 Non-pressure chronic ulcer of other part of right foot with necrosis of bone: Secondary | ICD-10-CM

## 2020-05-23 NOTE — Progress Notes (Signed)
Office Visit Note   Patient: [name]           Date of Birth: [birth year]           MRN: [id number] Visit Date: 05/23/2020              Requested by: No referring provider defined for this encounter. PCP: Patient, No Pcp Per  No chief complaint on file.     HPI: Patient is a 69 year old gentleman who presents in follow-up for chronic venous insufficiency both legs with a healing ulcer lateral malleolus left ankle patient has had ulcers over the tips of the second toes bilaterally he has been on antibiotics for this as well as taking Trent tall to improve his circulation and decrease the venous hypertension as well as using a nitroglycerin patch around the ulcer.  Patient has single-vessel runoff on his lower extremities and is not a revascularization candidate.  Assessment & Plan: Visit Diagnoses:  1. Ulcer of right second toe, with necrosis of bone ([place])   2. Chronic venous hypertension (idiopathic) with ulcer and inflammation of left lower extremity (HCC)     Plan: Recommend patient continue with the Trental continue with the nitroglycerin patch continue with compression elevation working the calf pump.  Follow-Up Instructions: Return in about 4 weeks (around 06/20/2020).   Ortho Exam  Patient is alert, oriented, no adenopathy, well-dressed, normal affect, normal respiratory effort. Examination patient has brawny edema with venous insufficiency both legs the ulcer of the lateral malleolus left ankle has healthy granulation tissue the wound bed this is 5 mm in diameter there is no exposed bone or tendon no cellulitis no tenderness to palpation.  The ulcers over the second toes bilaterally have healed there is some swelling but no cellulitis no sausage digit swelling.  Imaging: No results found. No images are attached to the encounter.  Labs: Lab Results  Component Value Date   HGBA1C (H) 09/19/2010    6.4 (NOTE)                                                                        According to the ADA Clinical Practice Recommendations for 2011, when HbA1c is used as a screening test:   >=6.5%   Diagnostic of Diabetes Mellitus           (if abnormal result  is confirmed)  5.7-6.4%   Increased risk of developing Diabetes Mellitus  References:Diagnosis and Classification of Diabetes Mellitus,Diabetes [id number] 1):S62-S69 and Standards of Medical Care in         Diabetes - 2011,Diabetes Care,2011,34  (Suppl 1):S11-S61.   HGBA1C (H) 10/30/2009    7.0 (NOTE)                                                                       According to the ADA Clinical Practice Recommendations for 2011, when HbA1c is used as a screening test:   >=6.5%   Diagnostic of Diabetes Mellitus           (  if abnormal result  is confirmed)  5.7-6.4%   Increased risk of developing Diabetes Mellitus  References:Diagnosis and Classification of Diabetes Mellitus,Diabetes [id number] 1):S62-S69 and Standards of Medical Care in         Diabetes - 2011,Diabetes [id number]  (Suppl 1):S11-S61.   HGBA1C  09/29/2007    5.7 (NOTE)   The ADA recommends the following therapeutic goals for glycemic   control related to Hgb A1C measurement:   Goal of Therapy:   < 7.0% Hgb A1C   Action Suggested:  > 8.0% Hgb A1C   Ref:  Diabetes Care, 22, Suppl. 1, 1999   ESRSEDRATE 17 (H) 10/30/2009   CRP 4.3 (H) 10/30/2009   REPTSTATUS 12/19/2018 FINAL 12/17/2018   GRAMSTAIN NO WBC SEEN NO ORGANISMS SEEN  12/17/2018   CULT  12/17/2018    FEW NORMAL SKIN FLORA Performed at [facility], [place] [address], [place], [place] [address]    LABORGA METHICILLIN RESISTANT STAPHYLOCOCCUS AUREUS 10/17/2007     Lab Results  Component Value Date   ALBUMIN 4.4 08/31/2015   ALBUMIN 4.1 01/27/2014   ALBUMIN 4.3 11/01/2013    No results found for: MG No results found for: VD25OH  No results found for: PREALBUMIN CBC EXTENDED Latest Ref Rng & Units 11/23/2019 01/29/2019 08/31/2015  WBC 3.8 - 10.8  Thousand/uL 6.4 - 5.5  RBC 4.20 - 5.80 Million/uL 4.91 - 4.85  HGB 13.2 - 17.1 g/dL 15.6 15.6 15.3  HCT 38.5 - 50.0 % 47.8 46.0 46.1  PLT 140 - 400 Thousand/uL 212 - 180  NEUTROABS 1,500 - 7,800 cells/uL 3,814 - -  LYMPHSABS 850 - 3,900 cells/uL 1,043 - -     There is no height or weight on file to calculate BMI.  Orders:  No orders of the defined types were placed in this encounter.  No orders of the defined types were placed in this encounter.    Procedures: No procedures performed  Clinical Data: No additional findings.  ROS:  All other systems negative, except as noted in the HPI. Review of Systems  Objective: Vital Signs: There were no vitals taken for this visit.  Specialty Comments:  No specialty comments available.  PMFS History: Patient Active Problem List   Diagnosis Date Noted  . Bilateral primary osteoarthritis of knee 09/07/2019  . Hx of adenomatous colonic polyps 07/25/2015  . Chest pain 01/05/2014  . Coronary artery disease 02/28/2011  . Glucose intolerance (pre-diabetes) 02/28/2011  . ALCOHOL ABUSE 11/21/2009  . WOUND, FOOT 02/11/2009  . VENOUS INSUFFICIENCY, CHRONIC, RIGHT LEG 06/07/2008  . HYPERLIPIDEMIA 03/11/2007  . HYPERTENSION 03/11/2007  . LOW BACK PAIN 03/11/2007   Past Medical History:  Diagnosis Date  . Alcohol abuse   . CAD (coronary artery disease)    a. s/p CABG 2009 (Dr. Roxy Manns - LIMA-OM2, L radial - PDA, SVG-Dx) // b. Nuc 8/15: No ischemia, EF 65, normal study // c. Nuc 3/17: No significant reversible ischemia. LVEF 64% with normal wall motion. This is a low risk study.   . Carotid stenosis    Carotid US 3/19: Bilateral ICA 1-39  . DDD (degenerative disc disease)   . Gout   . HLD (hyperlipidemia)   . Hx of adenomatous colonic polyps 07/25/2015  . Hyperglycemia   . Hypertension   . Lumbago    (low back pain)   . Neuropathy   . PAF (paroxysmal atrial fibrillation) ([place])    Anticoag (previously on Xarelto) stopped in 2017 due  to concerns over  bleeding risk in the setting of heavy alcohol use)  . Paroxysmal atrial flutter ([place])    TEE 6/15: EF 50-55, no RWMA, mild MR, LAE, no LAA clot >> s/p DCCV to restore NSR  . S/P CABG x 1   . Sciatica     Family History  Problem Relation Age of Onset  . Coronary artery disease Unknown        family history  . Heart disease Father        CABG   . Heart attack Father   . Stroke Unknown        family history  . Colon cancer Neg Hx   . Colon polyps Neg Hx   . Esophageal cancer Neg Hx   . Rectal cancer Neg Hx   . Stomach cancer Neg Hx     Past Surgical History:  Procedure Laterality Date  . ABDOMINAL AORTOGRAM W/LOWER EXTREMITY Bilateral 01/29/2019   Procedure: ABDOMINAL AORTOGRAM W/LOWER EXTREMITY;  Surgeon: Angelia Mould, MD;  Location: [place] [facility];  Service: Cardiovascular;  Laterality: Bilateral;  . CARDIAC CATHETERIZATION  4.17.09  . CARDIOVERSION N/A 11/03/2013   Procedure: CARDIOVERSION;  Surgeon: Dorothy Spark, MD;  Location: [facility] ENDOSCOPY;  Service: Cardiovascular;  Laterality: N/A;  . COLONOSCOPY  10-01-2002   gessner-tics and hems   . CORONARY ARTERY BYPASS GRAFT  5.8.09   x3  . INGUINAL HERNIA REPAIR Left   . LAPAROSCOPIC INGUINAL HERNIA REPAIR Right   . TEE WITHOUT CARDIOVERSION N/A 11/03/2013   Procedure: TRANSESOPHAGEAL ECHOCARDIOGRAM (TEE);  Surgeon: Dorothy Spark, MD;  Location: [place];  Service: Cardiovascular;  Laterality: N/A;  . TONSILLECTOMY AND ADENOIDECTOMY    . WRIST SURGERY     Social History   Occupational History  . Not on file  Tobacco Use  . Smoking status: Never Smoker  . Smokeless tobacco: Never Used  Substance and Sexual Activity  . Alcohol use: Yes    Alcohol/week: 0.0 standard drinks    Comment: 6 beers per week  . Drug use: No  . Sexual activity: Not on file

## 2020-06-09 ENCOUNTER — Other Ambulatory Visit: Payer: Self-pay | Admitting: Orthopedic Surgery

## 2020-06-09 NOTE — Telephone Encounter (Signed)
Does this pt need a refill?

## 2020-06-12 ENCOUNTER — Ambulatory Visit: Payer: Medicare Other | Admitting: Orthopedic Surgery

## 2020-06-27 ENCOUNTER — Ambulatory Visit (INDEPENDENT_AMBULATORY_CARE_PROVIDER_SITE_OTHER): Payer: Medicare Other | Admitting: Orthopedic Surgery

## 2020-06-27 DIAGNOSIS — I87332 Chronic venous hypertension (idiopathic) with ulcer and inflammation of left lower extremity: Secondary | ICD-10-CM

## 2020-06-27 DIAGNOSIS — L97929 Non-pressure chronic ulcer of unspecified part of left lower leg with unspecified severity: Secondary | ICD-10-CM

## 2020-06-28 ENCOUNTER — Encounter: Payer: Self-pay | Admitting: Orthopedic Surgery

## 2020-06-28 NOTE — Progress Notes (Signed)
Office Visit Note   Patient: [name]           Date of Birth: 02-05-1951           MRN: [id number] Visit Date: 06/27/2020              Requested by: No referring provider defined for this encounter. PCP: Patient, No Pcp Per  Chief Complaint  Patient presents with  . Right Leg - Follow-up  . Left Leg - Follow-up      HPI: Patient is a 70 year old gentleman who presents in follow-up for both lower extremities with venous stasis insufficiency and ulceration.  Patient states he has had increased swelling in the left lower extremity he states the cat did scratch his proximal calf.  He has had a chronic ulcer over the lateral malleolus left ankle  Assessment & Plan: Visit Diagnoses:  1. Chronic venous hypertension (idiopathic) with ulcer and inflammation of left lower extremity (HCC)     Plan: We will apply a Dynaflex compression wrap for the left lower extremity change this weekly.  Follow-Up Instructions: Return in about 1 week (around 07/04/2020).   Ortho Exam  Patient is alert, oriented, no adenopathy, well-dressed, normal affect, normal respiratory effort. Examination patient has significant increased swelling in the left lower extremity there are 2 scratches but no cellulitis no evidence of infection from the cat scratch.  The lateral malleolar ulcer is stable healthy is 5 mm in diameter with healthy  granulation tissue no exposed bone or tendon  Imaging: No results found. No images are attached to the encounter.  Labs: Lab Results  Component Value Date   HGBA1C (H) 09/19/2010    6.4 (NOTE)                                                                       According to the ADA Clinical Practice Recommendations for 2011, when HbA1c is used as a screening test:   >=6.5%   Diagnostic of Diabetes Mellitus           (if abnormal result  is confirmed)  5.7-6.4%   Increased risk of developing Diabetes Mellitus  References:Diagnosis and Classification of Diabetes  [place],Diabetes FUXN,2355,73(UKGUR 1):S62-S69 and Standards of Medical Care in         Diabetes - 2011,Diabetes Care,2011,34  (Suppl 1):S11-S61.   HGBA1C (H) 10/30/2009    7.0 (NOTE)                                                                       According to the ADA Clinical Practice Recommendations for 2011, when HbA1c is used as a screening test:   >=6.5%   Diagnostic of Diabetes Mellitus           (if abnormal result  is confirmed)  5.7-6.4%   Increased risk of developing Diabetes Mellitus  References:Diagnosis and Classification of Diabetes Mellitus,Diabetes KYHC,6237,62(GBTDV 1):S62-S69 and Standards of Medical Care in  Diabetes - 2011,Diabetes Care,2011,34  (Suppl 1):S11-S61.   HGBA1C  09/29/2007    5.7 (NOTE)   The ADA recommends the following therapeutic goals for glycemic   control related to Hgb A1C measurement:   Goal of Therapy:   < 7.0% Hgb A1C   Action Suggested:  > 8.0% Hgb A1C   Ref:  Diabetes Care, 22, Suppl. 1, 1999   ESRSEDRATE 17 (H) 10/30/2009   CRP 4.3 (H) 10/30/2009   REPTSTATUS 12/19/2018 FINAL 12/17/2018   GRAMSTAIN NO WBC SEEN NO ORGANISMS SEEN  12/17/2018   CULT  12/17/2018    FEW NORMAL SKIN FLORA Performed at [facility], [place] [address] [place], [place] [address]    LABORGA METHICILLIN RESISTANT STAPHYLOCOCCUS AUREUS 10/17/2007     Lab Results  Component Value Date   ALBUMIN 4.4 08/31/2015   ALBUMIN 4.1 01/27/2014   ALBUMIN 4.3 11/01/2013    No results found for: MG No results found for: VD25OH  No results found for: PREALBUMIN CBC EXTENDED Latest Ref Rng & Units 11/23/2019 01/29/2019 08/31/2015  WBC 3.8 - 10.8 Thousand/uL 6.4 - 5.5  RBC 4.20 - 5.80 Million/uL 4.91 - 4.85  HGB 13.2 - 17.1 g/dL 15.6 15.6 15.3  HCT 38.5 - 50.0 % 47.8 46.0 46.1  PLT 140 - 400 Thousand/uL 212 - 180  NEUTROABS 1,500 - 7,800 cells/uL 3,814 - -  LYMPHSABS 850 - 3,900 cells/uL 1,043 - -     There is no height or weight on file to calculate  BMI.  Orders:  No orders of the defined types were placed in this encounter.  No orders of the defined types were placed in this encounter.    Procedures: No procedures performed  Clinical Data: No additional findings.  ROS:  All other systems negative, except as noted in the HPI. Review of Systems  Objective: Vital Signs: There were no vitals taken for this visit.  Specialty Comments:  No specialty comments available.  PMFS History: Patient Active Problem List   Diagnosis Date Noted  . Bilateral primary osteoarthritis of knee 09/07/2019  . Hx of adenomatous colonic polyps 07/25/2015  . Chest pain 01/05/2014  . Coronary artery disease 02/28/2011  . Glucose intolerance (pre-diabetes) 02/28/2011  . ALCOHOL ABUSE 11/21/2009  . WOUND, FOOT 02/11/2009  . VENOUS INSUFFICIENCY, CHRONIC, RIGHT LEG 06/07/2008  . HYPERLIPIDEMIA 03/11/2007  . HYPERTENSION 03/11/2007  . LOW BACK PAIN 03/11/2007   Past Medical History:  Diagnosis Date  . Alcohol abuse   . CAD (coronary artery disease)    a. s/p CABG 2009 (Dr. Roxy Manns - LIMA-OM2, L radial - PDA, SVG-Dx) // b. Nuc 8/15: No ischemia, EF 65, normal study // c. Nuc 3/17: No significant reversible ischemia. LVEF 64% with normal wall motion. This is a low risk study.   . Carotid stenosis    Carotid US 3/19: Bilateral ICA 1-39  . DDD (degenerative disc disease)   . Gout   . HLD (hyperlipidemia)   . Hx of adenomatous colonic polyps 07/25/2015  . Hyperglycemia   . Hypertension   . Lumbago    (low back pain)   . Neuropathy   . PAF (paroxysmal atrial fibrillation) ([place])    Anticoag (previously on Xarelto) stopped in 2017 due to concerns over bleeding risk in the setting of heavy alcohol use)  . Paroxysmal atrial flutter ([place])    TEE 6/15: EF 50-55, no RWMA, mild MR, LAE, no LAA clot >> s/p DCCV to restore NSR  . S/P CABG  x 1   . Sciatica     Family History  Problem Relation Age of Onset  . Coronary artery disease Unknown         family history  . Heart disease Father        CABG   . Heart attack Father   . Stroke Unknown        family history  . Colon cancer Neg Hx   . Colon polyps Neg Hx   . Esophageal cancer Neg Hx   . Rectal cancer Neg Hx   . Stomach cancer Neg Hx     Past Surgical History:  Procedure Laterality Date  . ABDOMINAL AORTOGRAM W/LOWER EXTREMITY Bilateral 01/29/2019   Procedure: ABDOMINAL AORTOGRAM W/LOWER EXTREMITY;  Surgeon: Angelia Mould, MD;  Location: [place] [facility] LAB;  Service: Cardiovascular;  Laterality: Bilateral;  . CARDIAC CATHETERIZATION  4.17.09  . CARDIOVERSION N/A 11/03/2013   Procedure: CARDIOVERSION;  Surgeon: Dorothy Spark, MD;  Location[facility] ENDOSCOPY;  Service: Cardiovascular;  Laterality: N/A;  . COLONOSCOPY  10-01-2002   gessner-tics and hems   . CORONARY ARTERY BYPASS GRAFT  5.8.09   x3  . INGUINAL HERNIA REPAIR Left   . LAPAROSCOPIC INGUINAL HERNIA REPAIR Right   . TEE WITHOUT CARDIOVERSION N/A 11/03/2013   Procedure: TRANSESOPHAGEAL ECHOCARDIOGRAM (TEE);  Surgeon: Dorothy Spark, MD;  Location: [place];  Service: Cardiovascular;  Laterality: N/A;  . TONSILLECTOMY AND ADENOIDECTOMY    . WRIST SURGERY     Social History   Occupational History  . Not on file  Tobacco Use  . Smoking status: Never Smoker  . Smokeless tobacco: Never Used  Substance and Sexual Activity  . Alcohol use: Yes    Alcohol/week: 0.0 standard drinks    Comment: 6 beers per week  . Drug use: No  . Sexual activity: Not on file

## 2020-07-04 ENCOUNTER — Ambulatory Visit (INDEPENDENT_AMBULATORY_CARE_PROVIDER_SITE_OTHER): Payer: Medicare Other | Admitting: Orthopedic Surgery

## 2020-07-04 DIAGNOSIS — L97929 Non-pressure chronic ulcer of unspecified part of left lower leg with unspecified severity: Secondary | ICD-10-CM

## 2020-07-04 DIAGNOSIS — I87332 Chronic venous hypertension (idiopathic) with ulcer and inflammation of left lower extremity: Secondary | ICD-10-CM | POA: Diagnosis not present

## 2020-07-05 ENCOUNTER — Encounter: Payer: Self-pay | Admitting: Orthopedic Surgery

## 2020-07-05 NOTE — Progress Notes (Signed)
Office Visit Note   Patient: [name]           Date of Birth: 09-Jan-1951           MRN: [id number] Visit Date: 07/04/2020              Requested by: No referring provider defined for this encounter. PCP: Patient, No Pcp Per  Chief Complaint  Patient presents with  . Left Leg - Follow-up      HPI: Examination patient is a 70 year old gentleman with venous ulceration left lower extremity with a ulcer over the lateral malleolus.  Assessment & Plan: Visit Diagnoses:  1. Chronic venous hypertension (idiopathic) with ulcer and inflammation of left lower extremity (HCC)     Plan: We will continue with the compression wraps will apply silver cell to the ankle ulcer Unna paste and a Dynaflex wrap.  Follow-Up Instructions: Return in about 1 week (around 07/11/2020).   Ortho Exam  Patient is alert, oriented, no adenopathy, well-dressed, normal affect, normal respiratory effort. Examination patient does have dry cracked skin in the left leg the swelling is decreasing the wound over the lateral malleolus is 10 mm in diameter 1 mm deep and is 100% healthy granulation tissue after debridement.  Patient states that he is allergic to cotton we will apply the Unna paste to his leg.  There is no cellulitis no signs of infection the leg ulcers have healed other than the lateral malleolus ulcer.  Imaging: No results found. No images are attached to the encounter.  Labs: Lab Results  Component Value Date   HGBA1C (H) 09/19/2010    6.4 (NOTE)                                                                       According to the ADA Clinical Practice Recommendations for 2011, when HbA1c is used as a screening test:   >=6.5%   Diagnostic of Diabetes Mellitus           (if abnormal result  is confirmed)  5.7-6.4%   Increased risk of developing Diabetes Mellitus  References:Diagnosis and Classification of Diabetes Mellitus,Diabetes NWGN,5621,30(QMVHQ 1):S62-S69 and Standards of Medical  Care in         Diabetes - 2011,Diabetes Care,2011,34  (Suppl 1):S11-S61.   HGBA1C (H) 10/30/2009    7.0 (NOTE)                                                                       According to the ADA Clinical Practice Recommendations for 2011, when HbA1c is used as a screening test:   >=6.5%   Diagnostic of Diabetes Mellitus           (if abnormal result  is confirmed)  5.7-6.4%   Increased risk of developing Diabetes Mellitus  References:Diagnosis and Classification of Diabetes Mellitus,Diabetes IONG,2952,84(XLKGM 1):S62-S69 and Standards of Medical Care in         Diabetes - 2011,Diabetes WNUU,7253,66  (Suppl 1):S11-S61.  HGBA1C  09/29/2007    5.7 (NOTE)   The ADA recommends the following therapeutic goals for glycemic   control related to Hgb A1C measurement:   Goal of Therapy:   < 7.0% Hgb A1C   Action Suggested:  > 8.0% Hgb A1C   Ref:  Diabetes Care, 22, Suppl. 1, 1999   ESRSEDRATE 17 (H) 10/30/2009   CRP 4.3 (H) 10/30/2009   REPTSTATUS 12/19/2018 FINAL 12/17/2018   GRAMSTAIN NO WBC SEEN NO ORGANISMS SEEN  12/17/2018   CULT  12/17/2018    FEW NORMAL SKIN FLORA Performed at [facility], [place] [address] [place], [place] [address]    LABORGA METHICILLIN RESISTANT STAPHYLOCOCCUS AUREUS 10/17/2007     Lab Results  Component Value Date   ALBUMIN 4.4 08/31/2015   ALBUMIN 4.1 01/27/2014   ALBUMIN 4.3 11/01/2013    No results found for: MG No results found for: VD25OH  No results found for: PREALBUMIN CBC EXTENDED Latest Ref Rng & Units 11/23/2019 01/29/2019 08/31/2015  WBC 3.8 - 10.8 Thousand/uL 6.4 - 5.5  RBC 4.20 - 5.80 Million/uL 4.91 - 4.85  HGB 13.2 - 17.1 g/dL 15.6 15.6 15.3  HCT 38.5 - 50.0 % 47.8 46.0 46.1  PLT 140 - 400 Thousand/uL 212 - 180  NEUTROABS 1,500 - 7,800 cells/uL 3,814 - -  LYMPHSABS 850 - 3,900 cells/uL 1,043 - -     There is no height or weight on file to calculate BMI.  Orders:  No orders of the defined types were placed in this  encounter.  No orders of the defined types were placed in this encounter.    Procedures: No procedures performed  Clinical Data: No additional findings.  ROS:  All other systems negative, except as noted in the HPI. Review of Systems  Objective: Vital Signs: There were no vitals taken for this visit.  Specialty Comments:  No specialty comments available.  PMFS History: Patient Active Problem List   Diagnosis Date Noted  . Bilateral primary osteoarthritis of knee 09/07/2019  . Hx of adenomatous colonic polyps 07/25/2015  . Chest pain 01/05/2014  . Coronary artery disease 02/28/2011  . Glucose intolerance (pre-diabetes) 02/28/2011  . ALCOHOL ABUSE 11/21/2009  . WOUND, FOOT 02/11/2009  . VENOUS INSUFFICIENCY, CHRONIC, RIGHT LEG 06/07/2008  . HYPERLIPIDEMIA 03/11/2007  . HYPERTENSION 03/11/2007  . LOW BACK PAIN 03/11/2007   Past Medical History:  Diagnosis Date  . Alcohol abuse   . CAD (coronary artery disease)    a. s/p CABG 2009 (Dr. Roxy Manns - LIMA-OM2, L radial - PDA, SVG-Dx) // b. Nuc 8/15: No ischemia, EF 65, normal study // c. Nuc 3/17: No significant reversible ischemia. LVEF 64% with normal wall motion. This is a low risk study.   . Carotid stenosis    Carotid US 3/19: Bilateral ICA 1-39  . DDD (degenerative disc disease)   . Gout   . HLD (hyperlipidemia)   . Hx of adenomatous colonic polyps 07/25/2015  . Hyperglycemia   . Hypertension   . Lumbago    (low back pain)   . Neuropathy   . PAF (paroxysmal atrial fibrillation) ([place])    Anticoag (previously on Xarelto) stopped in 2017 due to concerns over bleeding risk in the setting of heavy alcohol use)  . Paroxysmal atrial flutter ([place])    TEE 6/15: EF 50-55, no RWMA, mild MR, LAE, no LAA clot >> s/p DCCV to restore NSR  . S/P CABG x 1   . Sciatica  Family History  Problem Relation Age of Onset  . Coronary artery disease Unknown        family history  . Heart disease Father        CABG   . Heart attack  Father   . Stroke Unknown        family history  . Colon cancer Neg Hx   . Colon polyps Neg Hx   . Esophageal cancer Neg Hx   . Rectal cancer Neg Hx   . Stomach cancer Neg Hx     Past Surgical History:  Procedure Laterality Date  . ABDOMINAL AORTOGRAM W/LOWER EXTREMITY Bilateral 01/29/2019   Procedure: ABDOMINAL AORTOGRAM W/LOWER EXTREMITY;  Surgeon: Angelia Mould, MD;  Location: [place] [facility];  Service: Cardiovascular;  Laterality: Bilateral;  . CARDIAC CATHETERIZATION  4.17.09  . CARDIOVERSION N/A 11/03/2013   Procedure: CARDIOVERSION;  Surgeon: Dorothy Spark, MD;  Location: [facility] ENDOSCOPY;  Service: Cardiovascular;  Laterality: N/A;  . COLONOSCOPY  10-01-2002   gessner-tics and hems   . CORONARY ARTERY BYPASS GRAFT  5.8.09   x3  . INGUINAL HERNIA REPAIR Left   . LAPAROSCOPIC INGUINAL HERNIA REPAIR Right   . TEE WITHOUT CARDIOVERSION N/A 11/03/2013   Procedure: TRANSESOPHAGEAL ECHOCARDIOGRAM (TEE);  Surgeon: Dorothy Spark, MD;  Location: [place];  Service: Cardiovascular;  Laterality: N/A;  . TONSILLECTOMY AND ADENOIDECTOMY    . WRIST SURGERY     Social History   Occupational History  . Not on file  Tobacco Use  . Smoking status: Never Smoker  . Smokeless tobacco: Never Used  Substance and Sexual Activity  . Alcohol use: Yes    Alcohol/week: 0.0 standard drinks    Comment: 6 beers per week  . Drug use: No  . Sexual activity: Not on file

## 2020-07-11 ENCOUNTER — Ambulatory Visit (INDEPENDENT_AMBULATORY_CARE_PROVIDER_SITE_OTHER): Payer: Medicare Other | Admitting: Orthopedic Surgery

## 2020-07-11 DIAGNOSIS — L97929 Non-pressure chronic ulcer of unspecified part of left lower leg with unspecified severity: Secondary | ICD-10-CM | POA: Diagnosis not present

## 2020-07-11 DIAGNOSIS — I87332 Chronic venous hypertension (idiopathic) with ulcer and inflammation of left lower extremity: Secondary | ICD-10-CM

## 2020-07-13 ENCOUNTER — Ambulatory Visit: Payer: Medicare Other | Admitting: Orthopedic Surgery

## 2020-07-17 ENCOUNTER — Ambulatory Visit: Payer: Medicare Other | Admitting: Orthopedic Surgery

## 2020-07-18 ENCOUNTER — Ambulatory Visit: Payer: Medicare Other | Admitting: Orthopedic Surgery

## 2020-07-19 ENCOUNTER — Telehealth: Payer: Self-pay | Admitting: Physician Assistant

## 2020-07-19 NOTE — Telephone Encounter (Signed)
I spoke to the patient by phone today.  He has missed 2 appointments and has compression wraps on.  He said they are starting to look "crusty "and may be infected.  He is wondering if he can come in tomorrow to have that looked at.  We have made an appointment for 4:00.  He understands we are busy that day but he may have to wait but we will see him

## 2020-07-19 NOTE — Telephone Encounter (Signed)
Patient called requesting a call back from PA Persons about wound. Patient phone number is [phone].

## 2020-07-20 ENCOUNTER — Ambulatory Visit (INDEPENDENT_AMBULATORY_CARE_PROVIDER_SITE_OTHER): Payer: Medicare Other | Admitting: Orthopedic Surgery

## 2020-07-20 DIAGNOSIS — L97929 Non-pressure chronic ulcer of unspecified part of left lower leg with unspecified severity: Secondary | ICD-10-CM | POA: Diagnosis not present

## 2020-07-20 DIAGNOSIS — I87332 Chronic venous hypertension (idiopathic) with ulcer and inflammation of left lower extremity: Secondary | ICD-10-CM

## 2020-07-21 ENCOUNTER — Encounter: Payer: Self-pay | Admitting: Orthopedic Surgery

## 2020-07-21 NOTE — Progress Notes (Signed)
Office Visit Note   Patient: [name]           Date of Birth: 11-30-1950           MRN: [id number] Visit Date: 07/20/2020              Requested by: No referring provider defined for this encounter. PCP: Patient, No Pcp Per  Chief Complaint  Patient presents with  . Left Leg - Follow-up  . Right Leg - Follow-up      HPI: Patient is a 70 year old gentleman who was seen in follow-up for venous insufficiency ulceration both lower extremities.  Patient on the last visit his swelling and ulcers have resolved he was placed in Kerlix and Ace wrap and patient was going to start wearing his compression stockings.  Patient states that he did not feel he could get his stockings on so he left the wraps in place and returns at this time with increased swelling with dry cracked dermatitis skin.  Assessment & Plan: Visit Diagnoses:  1. 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  Patient does not seem capable to apply compression socks may have to set him up for home health visits.  Follow-Up Instructions: Return in about 1 week (around 07/27/2020) for Follow-up twice a week.M[facility] Passey Exam  Patient is alert, oriented, no adenopathy, well-dressed, normal affect, normal respiratory effort. Examination patient has increased swelling of both lower extremities with dry cracked dermatitis skin there is some clear drainage there is no cellulitis.  Again we had a long discussion regarding proper treatment to decrease the swelling importance of elevation patient states he is concerned with the dry cracked skin.  Imaging: No results found. No images are attached to the encounter.  Labs: Lab Results  Component Value Date   HGBA1C (H) 09/19/2010    6.4 (NOTE)                                                                       According to the  ADA Clinical Practice Recommendations for 2011, when HbA1c is used as a screening test:   >=6.5%   Diagnostic of Diabetes Mellitus           (if abnormal result  is confirmed)  5.7-6.4%   Increased risk of developing Diabetes Mellitus  References:Diagnosis and Classification of Diabetes Mellitus,Diabetes SEGB,1517,61(YWVPX 1):S62-S69 and Standards of Medical Care in         Diabetes - 2011,Diabetes Care,2011,34  (Suppl 1):S11-S61.   HGBA1C (H) 10/30/2009    7.0 (NOTE)                                                                       According to the ADA Clinical Practice Recommendations for 2011, when HbA1c is used as a screening test:   >=6.5%   Diagnostic of  Diabetes Mellitus           (if abnormal result  is confirmed)  5.7-6.4%   Increased risk of developing Diabetes Mellitus  References:Diagnosis and Classification of Diabetes Mellitus,Diabetes QMGQ,6761,95(KDTOI 1):S62-S69 and Standards of Medical Care in         Diabetes - 2011,Diabetes ZTIW,5809,98  (Suppl 1):S11-S61.   HGBA1C  09/29/2007    5.7 (NOTE)   The ADA recommends the following therapeutic goals for glycemic   control related to Hgb A1C measurement:   Goal of Therapy:   < 7.0% Hgb A1C   Action Suggested:  > 8.0% Hgb A1C   Ref:  Diabetes Care, 22, Suppl. 1, 1999   ESRSEDRATE 17 (H) 10/30/2009   CRP 4.3 (H) 10/30/2009   REPTSTATUS 12/19/2018 FINAL 12/17/2018   GRAMSTAIN NO WBC SEEN NO ORGANISMS SEEN  12/17/2018   CULT  12/17/2018    FEW NORMAL SKIN FLORA Performed at [facility], [place] [address] [place], [place] [address]    LABORGA METHICILLIN RESISTANT STAPHYLOCOCCUS AUREUS 10/17/2007     Lab Results  Component Value Date   ALBUMIN 4.4 08/31/2015   ALBUMIN 4.1 01/27/2014   ALBUMIN 4.3 11/01/2013    No results found for: MG No results found for: VD25OH  No results found for: PREALBUMIN CBC EXTENDED Latest Ref Rng & Units 11/23/2019 01/29/2019 08/31/2015  WBC 3.8 - 10.8 Thousand/uL 6.4 - 5.5  RBC 4.20 -  5.80 Million/uL 4.91 - 4.85  HGB 13.2 - 17.1 g/dL 15.6 15.6 15.3  HCT 38.5 - 50.0 % 47.8 46.0 46.1  PLT 140 - 400 Thousand/uL 212 - 180  NEUTROABS 1,500 - 7,800 cells/uL 3,814 - -  LYMPHSABS 850 - 3,900 cells/uL 1,043 - -     There is no height or weight on file to calculate BMI.  Orders:  No orders of the defined types were placed in this encounter.  No orders of the defined types were placed in this encounter.    Procedures: No procedures performed  Clinical Data: No additional findings.  ROS:  All other systems negative, except as noted in the HPI. Review of Systems  Objective: Vital Signs: There were no vitals taken for this visit.  Specialty Comments:  No specialty comments available.  PMFS History: Patient Active Problem List   Diagnosis Date Noted  . Bilateral primary osteoarthritis of knee 09/07/2019  . Hx of adenomatous colonic polyps 07/25/2015  . Chest pain 01/05/2014  . Coronary artery disease 02/28/2011  . Glucose intolerance (pre-diabetes) 02/28/2011  . ALCOHOL ABUSE 11/21/2009  . WOUND, FOOT 02/11/2009  . VENOUS INSUFFICIENCY, CHRONIC, RIGHT LEG 06/07/2008  . HYPERLIPIDEMIA 03/11/2007  . HYPERTENSION 03/11/2007  . LOW BACK PAIN 03/11/2007   Past Medical History:  Diagnosis Date  . Alcohol abuse   . CAD (coronary artery disease)    a. s/p CABG 2009 (Dr. Roxy Manns - LIMA-OM2, L radial - PDA, SVG-Dx) // b. Nuc 8/15: No ischemia, EF 65, normal study // c. Nuc 3/17: No significant reversible ischemia. LVEF 64% with normal wall motion. This is a low risk study.   . Carotid stenosis    Carotid US 3/19: Bilateral ICA 1-39  . DDD (degenerative disc disease)   . Gout   . HLD (hyperlipidemia)   . Hx of adenomatous colonic polyps 07/25/2015  . Hyperglycemia   . Hypertension   . Lumbago    (low back pain)   . Neuropathy   . PAF (paroxysmal atrial fibrillation) ([place])  Anticoag (previously on Xarelto) stopped in 2017 due to concerns over bleeding risk in  the setting of heavy alcohol use)  . Paroxysmal atrial flutter ([place])    TEE 6/15: EF 50-55, no RWMA, mild MR, LAE, no LAA clot >> s/p DCCV to restore NSR  . S/P CABG x 1   . Sciatica     Family History  Problem Relation Age of Onset  . Coronary artery disease Unknown        family history  . Heart disease Father        CABG   . Heart attack Father   . Stroke Unknown        family history  . Colon cancer Neg Hx   . Colon polyps Neg Hx   . Esophageal cancer Neg Hx   . Rectal cancer Neg Hx   . Stomach cancer Neg Hx     Past Surgical History:  Procedure Laterality Date  . ABDOMINAL AORTOGRAM W/LOWER EXTREMITY Bilateral 01/29/2019   Procedure: ABDOMINAL AORTOGRAM W/LOWER EXTREMITY;  Surgeon: Angelia Mould, MD;  Location: [place] [facility];  Service: Cardiovascular;  Laterality: Bilateral;  . CARDIAC CATHETERIZATION  4.17.09  . CARDIOVERSION N/A 11/03/2013   Procedure: CARDIOVERSION;  Surgeon: Dorothy Spark, MD;  Location: [facility] ENDOSCOPY;  Service: Cardiovascular;  Laterality: N/A;  . COLONOSCOPY  10-01-2002   gessner-tics and hems   . CORONARY ARTERY BYPASS GRAFT  5.8.09   x3  . INGUINAL HERNIA REPAIR Left   . LAPAROSCOPIC INGUINAL HERNIA REPAIR Right   . TEE WITHOUT CARDIOVERSION N/A 11/03/2013   Procedure: TRANSESOPHAGEAL ECHOCARDIOGRAM (TEE);  Surgeon: Dorothy Spark, MD;  Location: [place];  Service: Cardiovascular;  Laterality: N/A;  . TONSILLECTOMY AND ADENOIDECTOMY    . WRIST SURGERY     Social History   Occupational History  . Not on file  Tobacco Use  . Smoking status: Never Smoker  . Smokeless tobacco: Never Used  Substance and Sexual Activity  . Alcohol use: Yes    Alcohol/week: 0.0 standard drinks    Comment: 6 beers per week  . Drug use: No  . Sexual activity: Not on file

## 2020-07-24 ENCOUNTER — Encounter: Payer: Self-pay | Admitting: Orthopedic Surgery

## 2020-07-24 ENCOUNTER — Ambulatory Visit: Payer: Medicare Other | Admitting: Orthopedic Surgery

## 2020-07-24 ENCOUNTER — Telehealth: Payer: Self-pay

## 2020-07-24 NOTE — Telephone Encounter (Signed)
Regarding previous message patient will be available after 3:00 I called your line spoke to Dr [facility] she said she would let you know to open a spot at 3:30 for patient

## 2020-07-24 NOTE — Progress Notes (Signed)
Office Visit Note   Patient: [name]           Date of Birth: [birth year]           MRN: [id number] Visit Date: 07/11/2020              Requested by: No referring provider defined for this encounter. PCP: Patient, No Pcp Per  Chief Complaint  Patient presents with  . Left Leg - Follow-up      HPI: Patient is a 70 year old gentleman who presents in follow-up status post compression wraps for venous ulcerations left lower extremity.  Assessment & Plan: Visit Diagnoses:  1. Chronic venous hypertension (idiopathic) with ulcer and inflammation of left lower extremity (HCC)     Plan: Patient has good progressive improvement in the swelling in both legs discussed continue wrapping versus using a compression sock patient states he would like to proceed with using a compression sock recommended that he use the size large will follow up in 1 week.  Follow-Up Instructions: Return in about 1 week (around 07/18/2020).   Ortho Exam  Patient is alert, oriented, no adenopathy, well-dressed, normal affect, normal respiratory effort. Examination patient has improved skin wrinkling there is some clear drainage but no cellulitis.  The ulcer over the lateral malleolus left leg has filled in with granulation tissue his calf is 37 cm in circumference.  Imaging: No results found. No images are attached to the encounter.  Labs: Lab Results  Component Value Date   HGBA1C (H) 09/19/2010    6.4 (NOTE)                                                                       According to the ADA Clinical Practice Recommendations for 2011, when HbA1c is used as a screening test:   >=6.5%   Diagnostic of Diabetes Mellitus           (if abnormal result  is confirmed)  5.7-6.4%   Increased risk of developing Diabetes Mellitus  References:Diagnosis and Classification of Diabetes Mellitus,Diabetes YWVP,7106,26(RSWNI 1):S62-S69 and Standards of Medical Care in         Diabetes - 2011,Diabetes  Care,2011,34  (Suppl 1):S11-S61.   HGBA1C (H) 10/30/2009    7.0 (NOTE)                                                                       According to the ADA Clinical Practice Recommendations for 2011, when HbA1c is used as a screening test:   >=6.5%   Diagnostic of Diabetes Mellitus           (if abnormal result  is confirmed)  5.7-6.4%   Increased risk of developing Diabetes Mellitus  References:Diagnosis and Classification of Diabetes Mellitus,Diabetes OEVO,3500,93(GHWEX 1):S62-S69 and Standards of Medical Care in         Diabetes - 2011,Diabetes HBZJ,6967,89  (Suppl 1):S11-S61.   HGBA1C  09/29/2007    5.7 (NOTE)   The ADA recommends the following therapeutic  goals for glycemic   control related to Hgb A1C measurement:   Goal of Therapy:   < 7.0% Hgb A1C   Action Suggested:  > 8.0% Hgb A1C   Ref:  Diabetes Care, 22, Suppl. 1, 1999   ESRSEDRATE 17 (H) 10/30/2009   CRP 4.3 (H) 10/30/2009   REPTSTATUS 12/19/2018 FINAL 12/17/2018   GRAMSTAIN NO WBC SEEN NO ORGANISMS SEEN  12/17/2018   CULT  12/17/2018    FEW NORMAL SKIN FLORA Performed at [facility], [place] [address], [place], [place] [address]    LABORGA METHICILLIN RESISTANT STAPHYLOCOCCUS AUREUS 10/17/2007     Lab Results  Component Value Date   ALBUMIN 4.4 08/31/2015   ALBUMIN 4.1 01/27/2014   ALBUMIN 4.3 11/01/2013    No results found for: MG No results found for: VD25OH  No results found for: PREALBUMIN CBC EXTENDED Latest Ref Rng & Units 11/23/2019 01/29/2019 08/31/2015  WBC 3.8 - 10.8 Thousand/uL 6.4 - 5.5  RBC 4.20 - 5.80 Million/uL 4.91 - 4.85  HGB 13.2 - 17.1 g/dL 15.6 15.6 15.3  HCT 38.5 - 50.0 % 47.8 46.0 46.1  PLT 140 - 400 Thousand/uL 212 - 180  NEUTROABS 1,500 - 7,800 cells/uL 3,814 - -  LYMPHSABS 850 - 3,900 cells/uL 1,043 - -     There is no height or weight on file to calculate BMI.  Orders:  No orders of the defined types were placed in this encounter.  No orders of the defined types were  placed in this encounter.    Procedures: No procedures performed  Clinical Data: No additional findings.  ROS:  All other systems negative, except as noted in the HPI. Review of Systems  Objective: Vital Signs: There were no vitals taken for this visit.  Specialty Comments:  No specialty comments available.  PMFS History: Patient Active Problem List   Diagnosis Date Noted  . Bilateral primary osteoarthritis of knee 09/07/2019  . Hx of adenomatous colonic polyps 07/25/2015  . Chest pain 01/05/2014  . Coronary artery disease 02/28/2011  . Glucose intolerance (pre-diabetes) 02/28/2011  . ALCOHOL ABUSE 11/21/2009  . WOUND, FOOT 02/11/2009  . VENOUS INSUFFICIENCY, CHRONIC, RIGHT LEG 06/07/2008  . HYPERLIPIDEMIA 03/11/2007  . HYPERTENSION 03/11/2007  . LOW BACK PAIN 03/11/2007   Past Medical History:  Diagnosis Date  . Alcohol abuse   . CAD (coronary artery disease)    a. s/p CABG 2009 (Dr. Roxy Manns - LIMA-OM2, L radial - PDA, SVG-Dx) // b. Nuc 8/15: No ischemia, EF 65, normal study // c. Nuc 3/17: No significant reversible ischemia. LVEF 64% with normal wall motion. This is a low risk study.   . Carotid stenosis    Carotid US 3/19: Bilateral ICA 1-39  . DDD (degenerative disc disease)   . Gout   . HLD (hyperlipidemia)   . Hx of adenomatous colonic polyps 07/25/2015  . Hyperglycemia   . Hypertension   . Lumbago    (low back pain)   . Neuropathy   . PAF (paroxysmal atrial fibrillation) ([place])    Anticoag (previously on Xarelto) stopped in 2017 due to concerns over bleeding risk in the setting of heavy alcohol use)  . Paroxysmal atrial flutter ([place])    TEE 6/15: EF 50-55, no RWMA, mild MR, LAE, no LAA clot >> s/p DCCV to restore NSR  . S/P CABG x 1   . Sciatica     Family History  Problem Relation Age of Onset  . Coronary artery disease Unknown  family history  . Heart disease Father        CABG   . Heart attack Father   . Stroke Unknown        family history   . Colon cancer Neg Hx   . Colon polyps Neg Hx   . Esophageal cancer Neg Hx   . Rectal cancer Neg Hx   . Stomach cancer Neg Hx     Past Surgical History:  Procedure Laterality Date  . ABDOMINAL AORTOGRAM W/LOWER EXTREMITY Bilateral 01/29/2019   Procedure: ABDOMINAL AORTOGRAM W/LOWER EXTREMITY;  Surgeon: Angelia Mould, MD;  Location: [place] [facility];  Service: Cardiovascular;  Laterality: Bilateral;  . CARDIAC CATHETERIZATION  4.17.09  . CARDIOVERSION N/A 11/03/2013   Procedure: CARDIOVERSION;  Surgeon: Dorothy Spark, MD;  Location: [facility] ENDOSCOPY;  Service: Cardiovascular;  Laterality: N/A;  . COLONOSCOPY  10-01-2002   gessner-tics and hems   . CORONARY ARTERY BYPASS GRAFT  5.8.09   x3  . INGUINAL HERNIA REPAIR Left   . LAPAROSCOPIC INGUINAL HERNIA REPAIR Right   . TEE WITHOUT CARDIOVERSION N/A 11/03/2013   Procedure: TRANSESOPHAGEAL ECHOCARDIOGRAM (TEE);  Surgeon: Dorothy Spark, MD;  Location: [place];  Service: Cardiovascular;  Laterality: N/A;  . TONSILLECTOMY AND ADENOIDECTOMY    . WRIST SURGERY     Social History   Occupational History  . Not on file  Tobacco Use  . Smoking status: Never Smoker  . Smokeless tobacco: Never Used  Substance and Sexual Activity  . Alcohol use: Yes    Alcohol/week: 0.0 standard drinks    Comment: 6 beers per week  . Drug use: No  . Sexual activity: Not on file

## 2020-07-24 NOTE — Telephone Encounter (Signed)
Can you please resch to tomorrow and let me know what time I need to open?

## 2020-07-24 NOTE — Telephone Encounter (Signed)
Patient called he would like to know if he can be worked into [employer] or Alma anne's schedule tomorrow afternoon if possible patient had a appointment scheduled for this afternoon he is unable to make it call back:[phone]

## 2020-07-25 ENCOUNTER — Ambulatory Visit (INDEPENDENT_AMBULATORY_CARE_PROVIDER_SITE_OTHER): Payer: Medicare Other | Admitting: Physician Assistant

## 2020-07-25 ENCOUNTER — Encounter: Payer: Self-pay | Admitting: Orthopedic Surgery

## 2020-07-25 DIAGNOSIS — I87332 Chronic venous hypertension (idiopathic) with ulcer and inflammation of left lower extremity: Secondary | ICD-10-CM | POA: Diagnosis not present

## 2020-07-25 DIAGNOSIS — L97929 Non-pressure chronic ulcer of unspecified part of left lower leg with unspecified severity: Secondary | ICD-10-CM

## 2020-07-25 NOTE — Progress Notes (Signed)
Office Visit Note   Patient: [name]           Date of Birth: 04/24/1951           MRN: [id number] Visit Date: 07/25/2020              Requested by: No referring provider defined for this encounter. PCP: Patient, No Pcp Per  Chief Complaint  Patient presents with  . Right Leg - Follow-up  . Left Leg - Follow-up      HPI:  70 year old gentleman who follows up for his bilateral venous insufficiency.  He has been undergoing biweekly Profore wraps. Assessment & Plan: Visit Diagnoses: No diagnosis found.  Plan: Patient will have his sock applied today.  He understands he needs to wear this 24 hours a day and change it daily.  We will follow-up later this week for rewrapping of the left side.  Follow-Up Instructions: No follow-ups on file.   Ortho Exam  Patient is alert, oriented, no adenopathy, well-dressed, normal affect, normal respiratory effort. Bilateral lower extremities decreased swelling especially on the right side.  The right leg he has wrinkling no open ulcers skin is much improved.  On the left side he has some mild drainage but no ulcers.  Improvement in skin and wrinkling.  Neither side has any sign of cellulitis or infection  Imaging: No results found. No images are attached to the encounter.  Labs: Lab Results  Component Value Date   HGBA1C (H) 09/19/2010    6.4 (NOTE)                                                                       According to the ADA Clinical Practice Recommendations for 2011, when HbA1c is used as a screening test:   >=6.5%   Diagnostic of Diabetes Mellitus           (if abnormal result  is confirmed)  5.7-6.4%   Increased risk of developing Diabetes Mellitus  References:Diagnosis and Classification of Diabetes Mellitus,Diabetes EGBT,5176,16(WVPXT 1):S62-S69 and Standards of Medical Care in         Diabetes - 2011,Diabetes Care,2011,34  (Suppl 1):S11-S61.   HGBA1C (H) 10/30/2009    7.0 (NOTE)                                                                        According to the ADA Clinical Practice Recommendations for 2011, when HbA1c is used as a screening test:   >=6.5%   Diagnostic of Diabetes Mellitus           (if abnormal result  is confirmed)  5.7-6.4%   Increased risk of developing Diabetes Mellitus  References:Diagnosis and Classification of Diabetes Mellitus,Diabetes GGYI,9485,46(EVOJJ 1):S62-S69 and Standards of Medical Care in         Diabetes - 2011,Diabetes KKXF,8182,99  (Suppl 1):S11-S61.   HGBA1C  09/29/2007    5.7 (NOTE)   The ADA recommends the following therapeutic goals for glycemic  control related to Hgb A1C measurement:   Goal of Therapy:   < 7.0% Hgb A1C   Action Suggested:  > 8.0% Hgb A1C   Ref:  Diabetes Care, 22, Suppl. 1, 1999   ESRSEDRATE 17 (H) 10/30/2009   CRP 4.3 (H) 10/30/2009   REPTSTATUS 12/19/2018 FINAL 12/17/2018   GRAMSTAIN NO WBC SEEN NO ORGANISMS SEEN  12/17/2018   CULT  12/17/2018    FEW NORMAL SKIN FLORA Performed at [facility], [place] [address], [place], [place] [address]    LABORGA METHICILLIN RESISTANT STAPHYLOCOCCUS AUREUS 10/17/2007     Lab Results  Component Value Date   ALBUMIN 4.4 08/31/2015   ALBUMIN 4.1 01/27/2014   ALBUMIN 4.3 11/01/2013    No results found for: MG No results found for: VD25OH  No results found for: PREALBUMIN CBC EXTENDED Latest Ref Rng & Units 11/23/2019 01/29/2019 08/31/2015  WBC 3.8 - 10.8 Thousand/uL 6.4 - 5.5  RBC 4.20 - 5.80 Million/uL 4.91 - 4.85  HGB 13.2 - 17.1 g/dL 15.6 15.6 15.3  HCT 38.5 - 50.0 % 47.8 46.0 46.1  PLT 140 - 400 Thousand/uL 212 - 180  NEUTROABS 1,500 - 7,800 cells/uL 3,814 - -  LYMPHSABS 850 - 3,900 cells/uL 1,043 - -     There is no height or weight on file to calculate BMI.  Orders:  No orders of the defined types were placed in this encounter.  No orders of the defined types were placed in this encounter.    Procedures: No procedures performed  Clinical Data: No additional  findings.  ROS:  All other systems negative, except as noted in the HPI. Review of Systems  Objective: Vital Signs: There were no vitals taken for this visit.  Specialty Comments:  No specialty comments available.  PMFS History: Patient Active Problem List   Diagnosis Date Noted  . Bilateral primary osteoarthritis of knee 09/07/2019  . Hx of adenomatous colonic polyps 07/25/2015  . Chest pain 01/05/2014  . Coronary artery disease 02/28/2011  . Glucose intolerance (pre-diabetes) 02/28/2011  . ALCOHOL ABUSE 11/21/2009  . WOUND, FOOT 02/11/2009  . VENOUS INSUFFICIENCY, CHRONIC, RIGHT LEG 06/07/2008  . HYPERLIPIDEMIA 03/11/2007  . HYPERTENSION 03/11/2007  . LOW BACK PAIN 03/11/2007   Past Medical History:  Diagnosis Date  . Alcohol abuse   . CAD (coronary artery disease)    a. s/p CABG 2009 (Dr. Roxy Manns - LIMA-OM2, L radial - PDA, SVG-Dx) // b. Nuc 8/15: No ischemia, EF 65, normal study // c. Nuc 3/17: No significant reversible ischemia. LVEF 64% with normal wall motion. This is a low risk study.   . Carotid stenosis    Carotid US 3/19: Bilateral ICA 1-39  . DDD (degenerative disc disease)   . Gout   . HLD (hyperlipidemia)   . Hx of adenomatous colonic polyps 07/25/2015  . Hyperglycemia   . Hypertension   . Lumbago    (low back pain)   . Neuropathy   . PAF (paroxysmal atrial fibrillation) ([place])    Anticoag (previously on Xarelto) stopped in 2017 due to concerns over bleeding risk in the setting of heavy alcohol use)  . Paroxysmal atrial flutter ([place])    TEE 6/15: EF 50-55, no RWMA, mild MR, LAE, no LAA clot >> s/p DCCV to restore NSR  . S/P CABG x 1   . Sciatica     Family History  Problem Relation Age of Onset  . Coronary artery disease Unknown  family history  . Heart disease Father        CABG   . Heart attack Father   . Stroke Unknown        family history  . Colon cancer Neg Hx   . Colon polyps Neg Hx   . Esophageal cancer Neg Hx   . Rectal cancer Neg  Hx   . Stomach cancer Neg Hx     Past Surgical History:  Procedure Laterality Date  . ABDOMINAL AORTOGRAM W/LOWER EXTREMITY Bilateral 01/29/2019   Procedure: ABDOMINAL AORTOGRAM W/LOWER EXTREMITY;  Surgeon: Angelia Mould, MD;  Location: [facility];  Service: Cardiovascular;  Laterality: Bilateral;  . CARDIAC CATHETERIZATION  4.17.09  . CARDIOVERSION N/A 11/03/2013   Procedure: CARDIOVERSION;  Surgeon: Dorothy Spark, MD;  Location: [facility];  Service: Cardiovascular;  Laterality: N/A;  . COLONOSCOPY  10-01-2002   gessner-tics and hems   . CORONARY ARTERY BYPASS GRAFT  5.8.09   x3  . INGUINAL HERNIA REPAIR Left   . LAPAROSCOPIC INGUINAL HERNIA REPAIR Right   . TEE WITHOUT CARDIOVERSION N/A 11/03/2013   Procedure: TRANSESOPHAGEAL ECHOCARDIOGRAM (TEE);  Surgeon: Dorothy Spark, MD;  Location: [place];  Service: Cardiovascular;  Laterality: N/A;  . TONSILLECTOMY AND ADENOIDECTOMY    . WRIST SURGERY     Social History   Occupational History  . Not on file  Tobacco Use  . Smoking status: Never Smoker  . Smokeless tobacco: Never Used  Substance and Sexual Activity  . Alcohol use: Yes    Alcohol/week: 0.0 standard drinks    Comment: 6 beers per week  . Drug use: No  . Sexual activity: Not on file

## 2020-08-01 ENCOUNTER — Encounter: Payer: Self-pay | Admitting: Orthopedic Surgery

## 2020-08-01 ENCOUNTER — Ambulatory Visit (INDEPENDENT_AMBULATORY_CARE_PROVIDER_SITE_OTHER): Payer: Medicare Other | Admitting: Physician Assistant

## 2020-08-01 DIAGNOSIS — I739 Peripheral vascular disease, unspecified: Secondary | ICD-10-CM

## 2020-08-01 NOTE — Progress Notes (Signed)
Office Visit Note   Patient: [name]           Date of Birth: 1950/08/24           MRN: [id number] Visit Date: 08/01/2020              Requested by: No referring provider defined for this encounter. PCP: Patient, No Pcp Per  Chief Complaint  Patient presents with  . Left Leg - Follow-up      HPI: Patient presents today for his bilateral lower extremity venous insufficiency.  He has had compression wraps on the left.  On the right he has been wearing his vive compression wrap.  He is feeling well without complaints  Assessment & Plan: Visit Diagnoses: No diagnosis found.  Plan: We will help him place a sock on his right leg.  On the left he says he would like to go home and put his sock on himself.  I did tell him this was very important and he intends to do this.  We will place him in a stress to get him home.  He will follow-up in 1 week.  Follow-Up Instructions: No follow-ups on file.   Ortho Exam  Patient is alert, oriented, no adenopathy, well-dressed, normal affect, normal respiratory effort. Bilateral lower extremities on the right side he has improving skin conditions especially in the proximal tibia.  Minimal soft tissue swelling.  No cellulitis no open areas.  On the left he has significant decrease in swelling.  He has wrinkling of the skin with stasis dermatitis from his toes up to his leg.  This is stable.  He does have a small skin breakdown on the posterior aspect of his calf proximally where the compression wrap had rolled down.  There is no surrounding cellulitis or signs of infection  Imaging: No results found. No images are attached to the encounter.  Labs: Lab Results  Component Value Date   HGBA1C (H) 09/19/2010    6.4 (NOTE)                                                                       According to the ADA Clinical Practice Recommendations for 2011, when HbA1c is used as a screening test:   >=6.5%   Diagnostic of Diabetes Mellitus            (if abnormal result  is confirmed)  5.7-6.4%   Increased risk of developing Diabetes Mellitus  References:Diagnosis and Classification of Diabetes Mellitus,Diabetes SPQZ,3007,62(UQJFH 1):S62-S69 and Standards of Medical Care in         Diabetes - 2011,Diabetes Care,2011,34  (Suppl 1):S11-S61.   HGBA1C (H) 10/30/2009    7.0 (NOTE)                                                                       According to the ADA Clinical Practice Recommendations for 2011, when HbA1c is used as a screening test:   >=6.5%   Diagnostic of  Diabetes Mellitus           (if abnormal result  is confirmed)  5.7-6.4%   Increased risk of developing Diabetes Mellitus  References:Diagnosis and Classification of Diabetes Mellitus,Diabetes KXFG,1829,93(ZJIRC 1):S62-S69 and Standards of Medical Care in         Diabetes - 2011,Diabetes VELF,8101,75  (Suppl 1):S11-S61.   HGBA1C  09/29/2007    5.7 (NOTE)   The ADA recommends the following therapeutic goals for glycemic   control related to Hgb A1C measurement:   Goal of Therapy:   < 7.0% Hgb A1C   Action Suggested:  > 8.0% Hgb A1C   Ref:  Diabetes Care, 22, Suppl. 1, 1999   ESRSEDRATE 17 (H) 10/30/2009   CRP 4.3 (H) 10/30/2009   REPTSTATUS 12/19/2018 FINAL 12/17/2018   GRAMSTAIN NO WBC SEEN NO ORGANISMS SEEN  12/17/2018   CULT  12/17/2018    FEW NORMAL SKIN FLORA Performed at [facility], [place] [address], [place], [place] [address]    LABORGA METHICILLIN RESISTANT STAPHYLOCOCCUS AUREUS 10/17/2007     Lab Results  Component Value Date   ALBUMIN 4.4 08/31/2015   ALBUMIN 4.1 01/27/2014   ALBUMIN 4.3 11/01/2013    No results found for: MG No results found for: VD25OH  No results found for: PREALBUMIN CBC EXTENDED Latest Ref Rng & Units 11/23/2019 01/29/2019 08/31/2015  WBC 3.8 - 10.8 Thousand/uL 6.4 - 5.5  RBC 4.20 - 5.80 Million/uL 4.91 - 4.85  HGB 13.2 - 17.1 g/dL 15.6 15.6 15.3  HCT 38.5 - 50.0 % 47.8 46.0 46.1  PLT 140 - 400 Thousand/uL 212 - 180   NEUTROABS 1,500 - 7,800 cells/uL 3,814 - -  LYMPHSABS 850 - 3,900 cells/uL 1,043 - -     There is no height or weight on file to calculate BMI.  Orders:  No orders of the defined types were placed in this encounter.  No orders of the defined types were placed in this encounter.    Procedures: No procedures performed  Clinical Data: No additional findings.  ROS:  All other systems negative, except as noted in the HPI. Review of Systems  Objective: Vital Signs: There were no vitals taken for this visit.  Specialty Comments:  No specialty comments available.  PMFS History: Patient Active Problem List   Diagnosis Date Noted  . Bilateral primary osteoarthritis of knee 09/07/2019  . Hx of adenomatous colonic polyps 07/25/2015  . Chest pain 01/05/2014  . Coronary artery disease 02/28/2011  . Glucose intolerance (pre-diabetes) 02/28/2011  . ALCOHOL ABUSE 11/21/2009  . WOUND, FOOT 02/11/2009  . VENOUS INSUFFICIENCY, CHRONIC, RIGHT LEG 06/07/2008  . HYPERLIPIDEMIA 03/11/2007  . HYPERTENSION 03/11/2007  . LOW BACK PAIN 03/11/2007   Past Medical History:  Diagnosis Date  . Alcohol abuse   . CAD (coronary artery disease)    a. s/p CABG 2009 (Dr. Roxy Manns - LIMA-OM2, L radial - PDA, SVG-Dx) // b. Nuc 8/15: No ischemia, EF 65, normal study // c. Nuc 3/17: No significant reversible ischemia. LVEF 64% with normal wall motion. This is a low risk study.   . Carotid stenosis    Carotid US 3/19: Bilateral ICA 1-39  . DDD (degenerative disc disease)   . Gout   . HLD (hyperlipidemia)   . Hx of adenomatous colonic polyps 07/25/2015  . Hyperglycemia   . Hypertension   . Lumbago    (low back pain)   . Neuropathy   . PAF (paroxysmal atrial fibrillation) ([place])  Anticoag (previously on Xarelto) stopped in 2017 due to concerns over bleeding risk in the setting of heavy alcohol use)  . Paroxysmal atrial flutter ([place])    TEE 6/15: EF 50-55, no RWMA, mild MR, LAE, no LAA clot >> s/p  DCCV to restore NSR  . S/P CABG x 1   . Sciatica     Family History  Problem Relation Age of Onset  . Coronary artery disease Unknown        family history  . Heart disease Father        CABG   . Heart attack Father   . Stroke Unknown        family history  . Colon cancer Neg Hx   . Colon polyps Neg Hx   . Esophageal cancer Neg Hx   . Rectal cancer Neg Hx   . Stomach cancer Neg Hx     Past Surgical History:  Procedure Laterality Date  . ABDOMINAL AORTOGRAM W/LOWER EXTREMITY Bilateral 01/29/2019   Procedure: ABDOMINAL AORTOGRAM W/LOWER EXTREMITY;  Surgeon: Angelia Mould, MD;  Location: [facility];  Service: Cardiovascular;  Laterality: Bilateral;  . CARDIAC CATHETERIZATION  4.17.09  . CARDIOVERSION N/A 11/03/2013   Procedure: CARDIOVERSION;  Surgeon: Dorothy Spark, MD;  Location: [facility] ENDOSCOPY;  Service: Cardiovascular;  Laterality: N/A;  . COLONOSCOPY  10-01-2002   gessner-tics and hems   . CORONARY ARTERY BYPASS GRAFT  5.8.09   x3  . INGUINAL HERNIA REPAIR Left   . LAPAROSCOPIC INGUINAL HERNIA REPAIR Right   . TEE WITHOUT CARDIOVERSION N/A 11/03/2013   Procedure: TRANSESOPHAGEAL ECHOCARDIOGRAM (TEE);  Surgeon: Dorothy Spark, MD;  Location: [place];  Service: Cardiovascular;  Laterality: N/A;  . TONSILLECTOMY AND ADENOIDECTOMY    . WRIST SURGERY     Social History   Occupational History  . Not on file  Tobacco Use  . Smoking status: Never Smoker  . Smokeless tobacco: Never Used  Substance and Sexual Activity  . Alcohol use: Yes    Alcohol/week: 0.0 standard drinks    Comment: 6 beers per week  . Drug use: No  . Sexual activity: Not on file

## 2020-08-11 ENCOUNTER — Telehealth: Payer: Self-pay | Admitting: Orthopedic Surgery

## 2020-08-11 NOTE — Telephone Encounter (Signed)
Error

## 2020-08-14 ENCOUNTER — Ambulatory Visit: Payer: Medicare Other | Admitting: Orthopedic Surgery

## 2020-08-14 ENCOUNTER — Encounter: Payer: Self-pay | Admitting: Orthopedic Surgery

## 2020-08-14 ENCOUNTER — Other Ambulatory Visit: Payer: Self-pay

## 2020-08-14 VITALS — Ht 72.0 in | Wt 200.0 lb

## 2020-08-14 DIAGNOSIS — L97929 Non-pressure chronic ulcer of unspecified part of left lower leg with unspecified severity: Secondary | ICD-10-CM | POA: Diagnosis not present

## 2020-08-14 DIAGNOSIS — I87332 Chronic venous hypertension (idiopathic) with ulcer and inflammation of left lower extremity: Secondary | ICD-10-CM

## 2020-08-14 NOTE — Progress Notes (Signed)
Office Visit Note   Patient: [name]           Date of Birth: Apr 19, 1951           MRN: [id number] Visit Date: 08/14/2020              Requested by: No referring provider defined for this encounter. PCP: Patient, No Pcp Per  Chief Complaint  Patient presents with  . Left Leg - Follow-up      HPI: Patient is a 70 year old gentleman who presents with increasing pain and swelling of the left lower extremity patient also has swelling of the toes on both feet.  He is currently using a size large compression stocking.  Patient states that he is flying out of town tomorrow and may not be back for a little while.  Assessment & Plan: Visit Diagnoses:  1. Chronic venous hypertension (idiopathic) with ulcer and inflammation of left lower extremity (HCC)     Plan: Discussed the importance of elevation compression exercise and protein supplement.  Recommended protein supplement twice a day in addition to current meals.  Follow-Up Instructions: Return in about 2 weeks (around 08/28/2020).   Ortho Exam  Patient is alert, oriented, no adenopathy, well-dressed, normal affect, normal respiratory effort. Examination patient has increased swelling with clear drainage from the mid aspect of the left tibia as well as dermatitis on both legs the ulcer on the lateral malleolus appears to be healing.  His calf on the left side is 41 cm in circumference 39 cm in circumference on the right.  Imaging: No results found. No images are attached to the encounter.  Labs: Lab Results  Component Value Date   HGBA1C (H) 09/19/2010    6.4 (NOTE)                                                                       According to the ADA Clinical Practice Recommendations for 2011, when HbA1c is used as a screening test:   >=6.5%   Diagnostic of Diabetes Mellitus           (if abnormal result  is confirmed)  5.7-6.4%   Increased risk of developing Diabetes Mellitus  References:Diagnosis and  Classification of Diabetes Mellitus,Diabetes GYIR,4854,62(VOJJK 1):S62-S69 and Standards of Medical Care in         Diabetes - 2011,Diabetes Care,2011,34  (Suppl 1):S11-S61.   HGBA1C (H) 10/30/2009    7.0 (NOTE)                                                                       According to the ADA Clinical Practice Recommendations for 2011, when HbA1c is used as a screening test:   >=6.5%   Diagnostic of Diabetes Mellitus           (if abnormal result  is confirmed)  5.7-6.4%   Increased risk of developing Diabetes Mellitus  References:Diagnosis and Classification of Diabetes Mellitus,Diabetes KXFG,1829,93(ZJIRC 1):S62-S69 and Standards of Medical Care in  Diabetes - 2011,Diabetes Care,2011,34  (Suppl 1):S11-S61.   HGBA1C  09/29/2007    5.7 (NOTE)   The ADA recommends the following therapeutic goals for glycemic   control related to Hgb A1C measurement:   Goal of Therapy:   < 7.0% Hgb A1C   Action Suggested:  > 8.0% Hgb A1C   Ref:  Diabetes Care, 22, Suppl. 1, 1999   ESRSEDRATE 17 (H) 10/30/2009   CRP 4.3 (H) 10/30/2009   REPTSTATUS 12/19/2018 FINAL 12/17/2018   GRAMSTAIN NO WBC SEEN NO ORGANISMS SEEN  12/17/2018   CULT  12/17/2018    FEW NORMAL SKIN FLORA Performed at [facility], [place] [address] [place], [place] [address]    LABORGA METHICILLIN RESISTANT STAPHYLOCOCCUS AUREUS 10/17/2007     Lab Results  Component Value Date   ALBUMIN 4.4 08/31/2015   ALBUMIN 4.1 01/27/2014   ALBUMIN 4.3 11/01/2013    No results found for: MG No results found for: VD25OH  No results found for: PREALBUMIN CBC EXTENDED Latest Ref Rng & Units 11/23/2019 01/29/2019 08/31/2015  WBC 3.8 - 10.8 Thousand/uL 6.4 - 5.5  RBC 4.20 - 5.80 Million/uL 4.91 - 4.85  HGB 13.2 - 17.1 g/dL 15.6 15.6 15.3  HCT 38.5 - 50.0 % 47.8 46.0 46.1  PLT 140 - 400 Thousand/uL 212 - 180  NEUTROABS 1,500 - 7,800 cells/uL 3,814 - -  LYMPHSABS 850 - 3,900 cells/uL 1,043 - -     Body mass index is 27.12  kg/m.  Orders:  No orders of the defined types were placed in this encounter.  No orders of the defined types were placed in this encounter.    Procedures: No procedures performed  Clinical Data: No additional findings.  ROS:  All other systems negative, except as noted in the HPI. Review of Systems  Objective: Vital Signs: Ht 6' (1.829 m)   Wt 200 lb (90.7 kg)   BMI 27.12 kg/m   Specialty Comments:  No specialty comments available.  PMFS History: Patient Active Problem List   Diagnosis Date Noted  . Bilateral primary osteoarthritis of knee 09/07/2019  . Hx of adenomatous colonic polyps 07/25/2015  . Chest pain 01/05/2014  . Coronary artery disease 02/28/2011  . Glucose intolerance (pre-diabetes) 02/28/2011  . ALCOHOL ABUSE 11/21/2009  . WOUND, FOOT 02/11/2009  . VENOUS INSUFFICIENCY, CHRONIC, RIGHT LEG 06/07/2008  . HYPERLIPIDEMIA 03/11/2007  . HYPERTENSION 03/11/2007  . LOW BACK PAIN 03/11/2007   Past Medical History:  Diagnosis Date  . Alcohol abuse   . CAD (coronary artery disease)    a. s/p CABG 2009 (Dr. Roxy Manns - LIMA-OM2, L radial - PDA, SVG-Dx) // b. Nuc 8/15: No ischemia, EF 65, normal study // c. Nuc 3/17: No significant reversible ischemia. LVEF 64% with normal wall motion. This is a low risk study.   . Carotid stenosis    Carotid US 3/19: Bilateral ICA 1-39  . DDD (degenerative disc disease)   . Gout   . HLD (hyperlipidemia)   . Hx of adenomatous colonic polyps 07/25/2015  . Hyperglycemia   . Hypertension   . Lumbago    (low back pain)   . Neuropathy   . PAF (paroxysmal atrial fibrillation) ([place])    Anticoag (previously on Xarelto) stopped in 2017 due to concerns over bleeding risk in the setting of heavy alcohol use)  . Paroxysmal atrial flutter ([place])    TEE 6/15: EF 50-55, no RWMA, mild MR, LAE, no LAA clot >> s/p DCCV to restore NSR  .  S/P CABG x 1   . Sciatica     Family History  Problem Relation Age of Onset  . Coronary artery disease  Other        family history  . Heart disease Father        CABG   . Heart attack Father   . Stroke Other        family history  . Colon cancer Neg Hx   . Colon polyps Neg Hx   . Esophageal cancer Neg Hx   . Rectal cancer Neg Hx   . Stomach cancer Neg Hx     Past Surgical History:  Procedure Laterality Date  . ABDOMINAL AORTOGRAM W/LOWER EXTREMITY Bilateral 01/29/2019   Procedure: ABDOMINAL AORTOGRAM W/LOWER EXTREMITY;  Surgeon: Angelia Mould, MD;  Location: [facility];  Service: Cardiovascular;  Laterality: Bilateral;  . CARDIAC CATHETERIZATION  4.17.09  . CARDIOVERSION N/A 11/03/2013   Procedure: CARDIOVERSION;  Surgeon: Dorothy Spark, MD;  Location: [facility] ENDOSCOPY;  Service: Cardiovascular;  Laterality: N/A;  . COLONOSCOPY  10-01-2002   gessner-tics and hems   . CORONARY ARTERY BYPASS GRAFT  5.8.09   x3  . INGUINAL HERNIA REPAIR Left   . LAPAROSCOPIC INGUINAL HERNIA REPAIR Right   . TEE WITHOUT CARDIOVERSION N/A 11/03/2013   Procedure: TRANSESOPHAGEAL ECHOCARDIOGRAM (TEE);  Surgeon: Dorothy Spark, MD;  Location: [facility];  Service: Cardiovascular;  Laterality: N/A;  . TONSILLECTOMY AND ADENOIDECTOMY    . WRIST SURGERY     Social History   Occupational History  . Not on file  Tobacco Use  . Smoking status: Never Smoker  . Smokeless tobacco: Never Used  Substance and Sexual Activity  . Alcohol use: Yes    Alcohol/week: 0.0 standard drinks    Comment: 6 beers per week  . Drug use: No  . Sexual activity: Not on file

## 2020-08-24 ENCOUNTER — Ambulatory Visit: Payer: Medicare Other | Admitting: Orthopedic Surgery

## 2020-08-29 ENCOUNTER — Ambulatory Visit: Payer: Medicare Other | Admitting: Orthopedic Surgery

## 2020-08-31 ENCOUNTER — Encounter: Payer: Self-pay | Admitting: Orthopedic Surgery

## 2020-08-31 ENCOUNTER — Ambulatory Visit (INDEPENDENT_AMBULATORY_CARE_PROVIDER_SITE_OTHER): Payer: Medicare Other | Admitting: Orthopedic Surgery

## 2020-08-31 DIAGNOSIS — I739 Peripheral vascular disease, unspecified: Secondary | ICD-10-CM | POA: Diagnosis not present

## 2020-08-31 DIAGNOSIS — L97929 Non-pressure chronic ulcer of unspecified part of left lower leg with unspecified severity: Secondary | ICD-10-CM | POA: Diagnosis not present

## 2020-08-31 DIAGNOSIS — I87332 Chronic venous hypertension (idiopathic) with ulcer and inflammation of left lower extremity: Secondary | ICD-10-CM

## 2020-08-31 NOTE — Progress Notes (Signed)
Office Visit Note   Patient: [name]           Date of Birth: 06-01-1951           MRN: [id number] Visit Date: 08/31/2020              Requested by: No referring provider defined for this encounter. PCP: Patient, No Pcp Per (Inactive)  Chief Complaint  Patient presents with  . Left Leg - Follow-up  . Right Leg - Follow-up      HPI: Patient is a 70 year old gentleman is seen in follow-up for venous and arterial insufficiency of both lower extremities.  Patient states he has increased pain in both lower extremities and new ulcers in the right lower extremity.  He states that the lateral malleolar ulcer on the left leg is looking better.  Assessment & Plan: Visit Diagnoses:  1. Chronic venous hypertension (idiopathic) with ulcer and inflammation of left lower extremity (HCC)   2. PVD (peripheral vascular disease) ([place])     Plan: We will make a follow-up urgent referral to vascular vein surgery patient's last ankle-brachial indices and arterial evaluation was about 2 years ago.  We will need to repeat his ABIs to see if there is been a significant change and vascular surgery evaluate for possible arterial study.  Follow-Up Instructions: Return in about 1 week (around 09/07/2020).   Ortho Exam  Patient is alert, oriented, no adenopathy, well-dressed, normal affect, normal respiratory effort. Examination patient has increased dermatitis on both legs there is dry cracked skin.  He has new ischemic ulcers over the base of the fifth metatarsal right foot great toe on the right foot and second toe on the right foot.  The ulcer over the lateral malleolus on the left foot shows a stable scab.  There is no cellulitis in either foot no drainage no signs of acute infection.  Imaging: No results found. No images are attached to the encounter.  Labs: Lab Results  Component Value Date   HGBA1C (H) 09/19/2010    6.4 (NOTE)                                                                        According to the ADA Clinical Practice Recommendations for 2011, when HbA1c is used as a screening test:   >=6.5%   Diagnostic of Diabetes Mellitus           (if abnormal result  is confirmed)  5.7-6.4%   Increased risk of developing Diabetes Mellitus  References:Diagnosis and Classification of Diabetes Mellitus,Diabetes ZMOQ,9476,54(YTKPT 1):S62-S69 and Standards of Medical Care in         Diabetes - 2011,Diabetes Care,2011,34  (Suppl 1):S11-S61.   HGBA1C (H) 10/30/2009    7.0 (NOTE)                                                                       According to the ADA Clinical Practice Recommendations for 2011, when HbA1c is used  as a screening test:   >=6.5%   Diagnostic of Diabetes Mellitus           (if abnormal result  is confirmed)  5.7-6.4%   Increased risk of developing Diabetes Mellitus  References:Diagnosis and Classification of Diabetes Mellitus,Diabetes WJXB,1478,29(FAOZH 1):S62-S69 and Standards of Medical Care in         Diabetes - 2011,Diabetes YQMV,7846,96  (Suppl 1):S11-S61.   HGBA1C  09/29/2007    5.7 (NOTE)   The ADA recommends the following therapeutic goals for glycemic   control related to Hgb A1C measurement:   Goal of Therapy:   < 7.0% Hgb A1C   Action Suggested:  > 8.0% Hgb A1C   Ref:  Diabetes Care, 22, Suppl. 1, 1999   ESRSEDRATE 17 (H) 10/30/2009   CRP 4.3 (H) 10/30/2009   REPTSTATUS 12/19/2018 FINAL 12/17/2018   GRAMSTAIN NO WBC SEEN NO ORGANISMS SEEN  12/17/2018   CULT  12/17/2018    FEW NORMAL SKIN FLORA Performed at [facility], [place] [address] [place], [place] [address]    LABORGA METHICILLIN RESISTANT STAPHYLOCOCCUS AUREUS 10/17/2007     Lab Results  Component Value Date   ALBUMIN 4.4 08/31/2015   ALBUMIN 4.1 01/27/2014   ALBUMIN 4.3 11/01/2013    No results found for: MG No results found for: VD25OH  No results found for: PREALBUMIN CBC EXTENDED Latest Ref Rng & Units 11/23/2019 01/29/2019 08/31/2015  WBC 3.8 - 10.8  Thousand/uL 6.4 - 5.5  RBC 4.20 - 5.80 Million/uL 4.91 - 4.85  HGB 13.2 - 17.1 g/dL 15.6 15.6 15.3  HCT 38.5 - 50.0 % 47.8 46.0 46.1  PLT 140 - 400 Thousand/uL 212 - 180  NEUTROABS 1,500 - 7,800 cells/uL 3,814 - -  LYMPHSABS 850 - 3,900 cells/uL 1,043 - -     There is no height or weight on file to calculate BMI.  Orders:  No orders of the defined types were placed in this encounter.  No orders of the defined types were placed in this encounter.    Procedures: No procedures performed  Clinical Data: No additional findings.  ROS:  All other systems negative, except as noted in the HPI. Review of Systems  Objective: Vital Signs: There were no vitals taken for this visit.  Specialty Comments:  No specialty comments available.  PMFS History: Patient Active Problem List   Diagnosis Date Noted  . Bilateral primary osteoarthritis of knee 09/07/2019  . Hx of adenomatous colonic polyps 07/25/2015  . Chest pain 01/05/2014  . Coronary artery disease 02/28/2011  . Glucose intolerance (pre-diabetes) 02/28/2011  . ALCOHOL ABUSE 11/21/2009  . WOUND, FOOT 02/11/2009  . VENOUS INSUFFICIENCY, CHRONIC, RIGHT LEG 06/07/2008  . HYPERLIPIDEMIA 03/11/2007  . HYPERTENSION 03/11/2007  . LOW BACK PAIN 03/11/2007   Past Medical History:  Diagnosis Date  . Alcohol abuse   . CAD (coronary artery disease)    a. s/p CABG 2009 (Dr. Roxy Manns - LIMA-OM2, L radial - PDA, SVG-Dx) // b. Nuc 8/15: No ischemia, EF 65, normal study // c. Nuc 3/17: No significant reversible ischemia. LVEF 64% with normal wall motion. This is a low risk study.   . Carotid stenosis    Carotid US 3/19: Bilateral ICA 1-39  . DDD (degenerative disc disease)   . Gout   . HLD (hyperlipidemia)   . Hx of adenomatous colonic polyps 07/25/2015  . Hyperglycemia   . Hypertension   . Lumbago    (low back pain)   . Neuropathy   .  PAF (paroxysmal atrial fibrillation) ([place])    Anticoag (previously on Xarelto) stopped in 2017 due  to concerns over bleeding risk in the setting of heavy alcohol use)  . Paroxysmal atrial flutter ([place])    TEE 6/15: EF 50-55, no RWMA, mild MR, LAE, no LAA clot >> s/p DCCV to restore NSR  . S/P CABG x 1   . Sciatica     Family History  Problem Relation Age of Onset  . Coronary artery disease Other        family history  . Heart disease Father        CABG   . Heart attack Father   . Stroke Other        family history  . Colon cancer Neg Hx   . Colon polyps Neg Hx   . Esophageal cancer Neg Hx   . Rectal cancer Neg Hx   . Stomach cancer Neg Hx     Past Surgical History:  Procedure Laterality Date  . ABDOMINAL AORTOGRAM W/LOWER EXTREMITY Bilateral 01/29/2019   Procedure: ABDOMINAL AORTOGRAM W/LOWER EXTREMITY;  Surgeon: Angelia Mould, MD;  Location: [place];  Service: Cardiovascular;  Laterality: Bilateral;  . CARDIAC CATHETERIZATION  4.17.09  . CARDIOVERSION N/A 11/03/2013   Procedure: CARDIOVERSION;  Surgeon: Dorothy Spark, MD;  Location: [facility] ENDOSCOPY;  Service: Cardiovascular;  Laterality: N/A;  . COLONOSCOPY  10-01-2002   gessner-tics and hems   . CORONARY ARTERY BYPASS GRAFT  5.8.09   x3  . INGUINAL HERNIA REPAIR Left   . LAPAROSCOPIC INGUINAL HERNIA REPAIR Right   . TEE WITHOUT CARDIOVERSION N/A 11/03/2013   Procedure: TRANSESOPHAGEAL ECHOCARDIOGRAM (TEE);  Surgeon: Dorothy Spark, MD;  Location: [place];  Service: Cardiovascular;  Laterality: N/A;  . TONSILLECTOMY AND ADENOIDECTOMY    . WRIST SURGERY     Social History   Occupational History  . Not on file  Tobacco Use  . Smoking status: Never Smoker  . Smokeless tobacco: Never Used  Substance and Sexual Activity  . Alcohol use: Yes    Alcohol/week: 0.0 standard drinks    Comment: 6 beers per week  . Drug use: No  . Sexual activity: Not on file

## 2020-09-07 ENCOUNTER — Ambulatory Visit: Payer: Medicare Other | Admitting: Orthopedic Surgery

## 2020-09-11 ENCOUNTER — Ambulatory Visit: Payer: Medicare Other | Admitting: Orthopedic Surgery

## 2020-09-12 ENCOUNTER — Ambulatory Visit (INDEPENDENT_AMBULATORY_CARE_PROVIDER_SITE_OTHER): Payer: Medicare Other | Admitting: Physician Assistant

## 2020-09-12 ENCOUNTER — Encounter: Payer: Self-pay | Admitting: Orthopedic Surgery

## 2020-09-12 DIAGNOSIS — L97929 Non-pressure chronic ulcer of unspecified part of left lower leg with unspecified severity: Secondary | ICD-10-CM

## 2020-09-12 DIAGNOSIS — I87332 Chronic venous hypertension (idiopathic) with ulcer and inflammation of left lower extremity: Secondary | ICD-10-CM

## 2020-09-12 NOTE — Progress Notes (Signed)
Office Visit Note   Patient: [name]           Date of Birth: 09-Jul-1950           MRN: [id number] Visit Date: 09/12/2020              Requested by: No referring provider defined for this encounter. PCP: Patient, No Pcp Per (Inactive)  Chief Complaint  Patient presents with  . Left Leg - Follow-up  . Right Leg - Follow-up      HPI: Patient presents today for follow-up on his bilateral lower extremity venous stasis.  He states he cannot change his socks on his own and needs to have them changed for him.  He feels his legs continue to have progressive vascular issues.  He was supposed to have a vascular consult and ABIs but they have been unable to contact him and he has not returned their phone calls.  He does tell me today that he has an appointment next week He did tells me later in the visit that he does not have an appointment but will call to make 1 Assessment & Plan: Visit Diagnoses: No diagnosis found.  Plan: Stressed the importance of him following up with vascular he knows he is at high risk for amputation.  He also needs to be changing his socks daily.  He can come in on Monday to have them changed by [place].  We will also double check at that time that her appointment has been made for vascular  Follow-Up Instructions: No follow-ups on file.   Ortho Exam  Patient is alert, oriented, no adenopathy, well-dressed, normal affect, normal respiratory effort.  Patient has increased dermatitis in both legs there is cracked dry skin he has asked to make ulcers over the great toe on the right foot second toe on the right foot and base of the fifth metatarsal the lateral on the left has a scab.  No ascending cellulitis   Imaging: No results found. No images are attached to the encounter.  Labs: Lab Results  Component Value Date   HGBA1C (H) 09/19/2010    6.4 (NOTE)                                                                       According to the ADA Clinical  Practice Recommendations for 2011, when HbA1c is used as a screening test:   >=6.5%   Diagnostic of Diabetes Mellitus           (if abnormal result  is confirmed)  5.7-6.4%   Increased risk of developing Diabetes Mellitus  References:Diagnosis and Classification of Diabetes Mellitus,Diabetes KCLE,7517,00(FVCBS 1):S62-S69 and Standards of Medical Care in         Diabetes - 2011,Diabetes Care,2011,34  (Suppl 1):S11-S61.   HGBA1C (H) 10/30/2009    7.0 (NOTE)  According to the ADA Clinical Practice Recommendations for 2011, when HbA1c is used as a screening test:   >=6.5%   Diagnostic of Diabetes Mellitus           (if abnormal result  is confirmed)  5.7-6.4%   Increased risk of developing Diabetes Mellitus  References:Diagnosis and Classification of Diabetes Mellitus,Diabetes AYTK,1601,09(NATFT 1):S62-S69 and Standards of Medical Care in         Diabetes - 2011,Diabetes DDUK,0254,27  (Suppl 1):S11-S61.   HGBA1C  09/29/2007    5.7 (NOTE)   The ADA recommends the following therapeutic goals for glycemic   control related to Hgb A1C measurement:   Goal of Therapy:   < 7.0% Hgb A1C   Action Suggested:  > 8.0% Hgb A1C   Ref:  Diabetes Care, 22, Suppl. 1, 1999   ESRSEDRATE 17 (H) 10/30/2009   CRP 4.3 (H) 10/30/2009   REPTSTATUS 12/19/2018 FINAL 12/17/2018   GRAMSTAIN NO WBC SEEN NO ORGANISMS SEEN  12/17/2018   CULT  12/17/2018    FEW NORMAL SKIN FLORA Performed at [facility], [place] [address] [place], [place] [address]    LABORGA METHICILLIN RESISTANT STAPHYLOCOCCUS AUREUS 10/17/2007     Lab Results  Component Value Date   ALBUMIN 4.4 08/31/2015   ALBUMIN 4.1 01/27/2014   ALBUMIN 4.3 11/01/2013    No results found for: MG No results found for: VD25OH  No results found for: PREALBUMIN CBC EXTENDED Latest Ref Rng & Units 11/23/2019 01/29/2019 08/31/2015  WBC 3.8 - 10.8 Thousand/uL 6.4 - 5.5  RBC 4.20 - 5.80 Million/uL  4.91 - 4.85  HGB 13.2 - 17.1 g/dL 15.6 15.6 15.3  HCT 38.5 - 50.0 % 47.8 46.0 46.1  PLT 140 - 400 Thousand/uL 212 - 180  NEUTROABS 1,500 - 7,800 cells/uL 3,814 - -  LYMPHSABS 850 - 3,900 cells/uL 1,043 - -     There is no height or weight on file to calculate BMI.  Orders:  No orders of the defined types were placed in this encounter.  No orders of the defined types were placed in this encounter.    Procedures: No procedures performed  Clinical Data: No additional findings.  ROS:  All other systems negative, except as noted in the HPI. Review of Systems  Objective: Vital Signs: There were no vitals taken for this visit.  Specialty Comments:  No specialty comments available.  PMFS History: Patient Active Problem List   Diagnosis Date Noted  . Bilateral primary osteoarthritis of knee 09/07/2019  . Hx of adenomatous colonic polyps 07/25/2015  . Chest pain 01/05/2014  . Coronary artery disease 02/28/2011  . Glucose intolerance (pre-diabetes) 02/28/2011  . ALCOHOL ABUSE 11/21/2009  . WOUND, FOOT 02/11/2009  . VENOUS INSUFFICIENCY, CHRONIC, RIGHT LEG 06/07/2008  . HYPERLIPIDEMIA 03/11/2007  . HYPERTENSION 03/11/2007  . LOW BACK PAIN 03/11/2007   Past Medical History:  Diagnosis Date  . Alcohol abuse   . CAD (coronary artery disease)    a. s/p CABG 2009 (Dr. Roxy Manns - LIMA-OM2, L radial - PDA, SVG-Dx) // b. Nuc 8/15: No ischemia, EF 65, normal study // c. Nuc 3/17: No significant reversible ischemia. LVEF 64% with normal wall motion. This is a low risk study.   . Carotid stenosis    Carotid US 3/19: Bilateral ICA 1-39  . DDD (degenerative disc disease)   . Gout   . HLD (hyperlipidemia)   . Hx of adenomatous colonic polyps 07/25/2015  . Hyperglycemia   . Hypertension   .  Lumbago    (low back pain)   . Neuropathy   . PAF (paroxysmal atrial fibrillation) ([place])    Anticoag (previously on Xarelto) stopped in 2017 due to concerns over bleeding risk in the setting of  heavy alcohol use)  . Paroxysmal atrial flutter ([place])    TEE 6/15: EF 50-55, no RWMA, mild MR, LAE, no LAA clot >> s/p DCCV to restore NSR  . S/P CABG x 1   . Sciatica     Family History  Problem Relation Age of Onset  . Coronary artery disease Other        family history  . Heart disease Father        CABG   . Heart attack Father   . Stroke Other        family history  . Colon cancer Neg Hx   . Colon polyps Neg Hx   . Esophageal cancer Neg Hx   . Rectal cancer Neg Hx   . Stomach cancer Neg Hx     Past Surgical History:  Procedure Laterality Date  . ABDOMINAL AORTOGRAM W/LOWER EXTREMITY Bilateral 01/29/2019   Procedure: ABDOMINAL AORTOGRAM W/LOWER EXTREMITY;  Surgeon: Angelia Mould, MD;  Location: [place] [facility];  Service: Cardiovascular;  Laterality: Bilateral;  . CARDIAC CATHETERIZATION  4.17.09  . CARDIOVERSION N/A 11/03/2013   Procedure: CARDIOVERSION;  Surgeon: Dorothy Spark, MD;  Location: [facility] ENDOSCOPY;  Service: Cardiovascular;  Laterality: N/A;  . COLONOSCOPY  10-01-2002   gessner-tics and hems   . CORONARY ARTERY BYPASS GRAFT  5.8.09   x3  . INGUINAL HERNIA REPAIR Left   . LAPAROSCOPIC INGUINAL HERNIA REPAIR Right   . TEE WITHOUT CARDIOVERSION N/A 11/03/2013   Procedure: TRANSESOPHAGEAL ECHOCARDIOGRAM (TEE);  Surgeon: Dorothy Spark, MD;  Location: [place];  Service: Cardiovascular;  Laterality: N/A;  . TONSILLECTOMY AND ADENOIDECTOMY    . WRIST SURGERY     Social History   Occupational History  . Not on file  Tobacco Use  . Smoking status: Never Smoker  . Smokeless tobacco: Never Used  Substance and Sexual Activity  . Alcohol use: Yes    Alcohol/week: 0.0 standard drinks    Comment: 6 beers per week  . Drug use: No  . Sexual activity: Not on file

## 2020-09-14 ENCOUNTER — Telehealth: Payer: Self-pay | Admitting: Orthopedic Surgery

## 2020-09-14 NOTE — Telephone Encounter (Signed)
Patient called needing Dr. Sharol Given to contact him before the weekend starts in reference to his wound. Patient said he is concerned about the wound /infection. The number to contact patient is [phone]

## 2020-09-14 NOTE — Telephone Encounter (Signed)
I called pt and he states that he would just like Dr. Sharol Given to return his call.

## 2020-09-18 ENCOUNTER — Ambulatory Visit: Payer: Medicare Other | Admitting: Orthopedic Surgery

## 2020-09-18 NOTE — Telephone Encounter (Signed)
I called patient. He states he has no questions at this time, he will see me in the office this week.

## 2020-09-19 ENCOUNTER — Ambulatory Visit: Payer: Medicare Other | Admitting: Orthopedic Surgery

## 2020-10-03 ENCOUNTER — Ambulatory Visit: Payer: Medicare Other | Admitting: Orthopedic Surgery

## 2020-10-03 ENCOUNTER — Telehealth: Payer: Self-pay

## 2020-10-03 NOTE — Telephone Encounter (Signed)
Patient's wife, [place] called stating that patient is refusing to be seen in the office and believes that patient has gangrene.  Would like a call back if possible?  Cb# [phone].  Please advise.  Thank you.

## 2020-10-03 NOTE — Telephone Encounter (Signed)
Dr. Sharol Given called and sw pt's wife.

## 2020-10-03 NOTE — Telephone Encounter (Signed)
I called and sw pt's wife and she states that the pt is sleeping all day and that he is barley eating or drinking. He will not allow his wife to assist with dressing changes or wound care. She states that he will not come in he will not go to the hospital and will not have surgery. She states that she wants to honor his wishes and that she is fearful he is nearing end of life. States that his affairs are in order and that he is a DNR. She is very tearful and states that she does not know what to do. She would like Dr. Sharol Given to call her and advise what it is that she should expect in the time to come. She states that the lst time she was able to see the pt's feet one of his toes was completely black and there was black scabbing on the side of his foot. She is positive that the pt would require surgery and he does not want to do this at all. Advised that I would hold this message until the end of clinic and have Dr. Sharol Given to call her back.

## 2020-10-06 ENCOUNTER — Ambulatory Visit (INDEPENDENT_AMBULATORY_CARE_PROVIDER_SITE_OTHER): Payer: Medicare Other | Admitting: Physician Assistant

## 2020-10-06 ENCOUNTER — Encounter: Payer: Self-pay | Admitting: Physician Assistant

## 2020-10-06 DIAGNOSIS — M8600 Acute hematogenous osteomyelitis, unspecified site: Secondary | ICD-10-CM | POA: Diagnosis not present

## 2020-10-06 MED ORDER — AMOXICILLIN-POT CLAVULANATE 875-125 MG PO TABS: 1.0000 | ORAL_TABLET | Freq: Two times a day (BID) | ORAL | 0 refills | Status: DC

## 2020-10-06 NOTE — Progress Notes (Signed)
Office Visit Note   Patient: [name]           Date of Birth: 06-18-1950           MRN: [id number] Visit Date: 10/06/2020              Requested by: No referring provider defined for this encounter. PCP: Patient, No Pcp Per (Inactive)  No chief complaint on file.     HPI: The patient is a 70 year old gentleman who presents with his wife today. 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  When he was seen by Dr. Sharol Given on March 31 he was given a referral to vascular surgery to evaluate for possible arterial study.  Vascular surgery made attempts to schedule this with the patient.  He did not go forward with this appointment.  He has missed several appointments including his last 2 appointments with [place].  He has not changed his bandages.  His wife has offered to change them for him and he has not allowed her to do this.  He has been on and off doxycycline.  His wife called today with concerns of progressive odor and concerns for the wounds. Assessment & Plan: Visit Diagnoses: No diagnosis found.  Plan: New dressings were placed today.  The patient was directly seen by Dr. Sharol Given.  He is to do daily cleansing of both feet with application of new dressings.  His wife is willing to help him with this.  We will post him for surgery on Wednesday.  He understands this may be debridement with skin grafting but could be a below-knee amputation.  If for any reason his condition deteriorates over the weekend he should go to the emergency room.  He understands he is at a high risk for sepsis. I have also called in a prescription for Augmentin which she is to begin taking instead of the doxycycline Follow-Up Instructions: No follow-ups on file.   Ortho Exam  Patient is alert, oriented, no adenopathy, well-dressed, normal affect, normal respiratory effort. Right foot dressing was  removed.  Dressing has a foul odor and is obvious be been in place for a long time.  Several mature maggots came out of the wound.  The great toe has necrosis and exposed bone with erythema.  There is associated foul odor.  On the lateral foot he has a deep ulcer with dead tendon that also probes to bone also with foul odor he does not have ascending cellulitis at this point.  His leg and calf actually look fairly good without any open ulcers Left foot dressing was removed he has a 3 cm ulcer on the lateral malleolus with fibrinous tissue at the base.  Swelling in the calf is good and the leg itself does not have any open ulcers except for this 1.  There is no ascending cellulitis Heart regular rate and rhythm lungs clear  Imaging: No results found.     Labs: Lab Results  Component Value Date   HGBA1C (H) 09/19/2010    6.4 (NOTE)  According to the ADA Clinical Practice Recommendations for 2011, when HbA1c is used as a screening test:   >=6.5%   Diagnostic of Diabetes Mellitus           (if abnormal result  is confirmed)  5.7-6.4%   Increased risk of developing Diabetes Mellitus  References:Diagnosis and Classification of Diabetes Mellitus,Diabetes SEGB,1517,61(YWVPX 1):S62-S69 and Standards of Medical Care in         Diabetes - 2011,Diabetes TGGY,6948,54  (Suppl 1):S11-S61.   HGBA1C (H) 10/30/2009    7.0 (NOTE)                                                                       According to the ADA Clinical Practice Recommendations for 2011, when HbA1c is used as a screening test:   >=6.5%   Diagnostic of Diabetes Mellitus           (if abnormal result  is confirmed)  5.7-6.4%   Increased risk of developing Diabetes Mellitus  References:Diagnosis and Classification of Diabetes Mellitus,Diabetes OEVO,3500,93(GHWEX 1):S62-S69 and Standards of Medical Care in         Diabetes - 2011,Diabetes HBZJ,6967,89  (Suppl 1):S11-S61.    HGBA1C  09/29/2007    5.7 (NOTE)   The ADA recommends the following therapeutic goals for glycemic   control related to Hgb A1C measurement:   Goal of Therapy:   < 7.0% Hgb A1C   Action Suggested:  > 8.0% Hgb A1C   Ref:  Diabetes Care, 22, Suppl. 1, 1999   ESRSEDRATE 17 (H) 10/30/2009   CRP 4.3 (H) 10/30/2009   REPTSTATUS 12/19/2018 FINAL 12/17/2018   GRAMSTAIN NO WBC SEEN NO ORGANISMS SEEN  12/17/2018   CULT  12/17/2018    FEW NORMAL SKIN FLORA Performed at [facility], [place] [address], [place], [place] [address]    LABORGA METHICILLIN RESISTANT STAPHYLOCOCCUS AUREUS 10/17/2007     Lab Results  Component Value Date   ALBUMIN 4.4 08/31/2015   ALBUMIN 4.1 01/27/2014   ALBUMIN 4.3 11/01/2013    No results found for: MG No results found for: VD25OH  No results found for: PREALBUMIN CBC EXTENDED Latest Ref Rng & Units 11/23/2019 01/29/2019 08/31/2015  WBC 3.8 - 10.8 Thousand/uL 6.4 - 5.5  RBC 4.20 - 5.80 Million/uL 4.91 - 4.85  HGB 13.2 - 17.1 g/dL 15.6 15.6 15.3  HCT 38.5 - 50.0 % 47.8 46.0 46.1  PLT 140 - 400 Thousand/uL 212 - 180  NEUTROABS 1,500 - 7,800 cells/uL 3,814 - -  LYMPHSABS 850 - 3,900 cells/uL 1,043 - -     There is no height or weight on file to calculate BMI.  Orders:  No orders of the defined types were placed in this encounter.  Meds ordered this encounter  Medications  . amoxicillin-clavulanate (AUGMENTIN) 875-125 MG tablet    Sig: Take 1 tablet by mouth 2 (two) times daily for 14 days.    Dispense:  28 tablet    Refill:  0     Procedures: No procedures performed  Clinical Data: No additional findings.  ROS:  All other systems negative, except as noted in the HPI. Review of Systems  Objective: Vital Signs: There were no vitals taken for this visit.  Specialty  Comments:  No specialty comments available.  PMFS History: Patient Active Problem List   Diagnosis Date Noted  . Bilateral primary osteoarthritis of knee 09/07/2019  . Hx  of adenomatous colonic polyps 07/25/2015  . Chest pain 01/05/2014  . Coronary artery disease 02/28/2011  . Glucose intolerance (pre-diabetes) 02/28/2011  . ALCOHOL ABUSE 11/21/2009  . WOUND, FOOT 02/11/2009  . VENOUS INSUFFICIENCY, CHRONIC, RIGHT LEG 06/07/2008  . HYPERLIPIDEMIA 03/11/2007  . HYPERTENSION 03/11/2007  . LOW BACK PAIN 03/11/2007   Past Medical History:  Diagnosis Date  . Alcohol abuse   . CAD (coronary artery disease)    a. s/p CABG 2009 (Dr. Roxy Manns - LIMA-OM2, L radial - PDA, SVG-Dx) // b. Nuc 8/15: No ischemia, EF 65, normal study // c. Nuc 3/17: No significant reversible ischemia. LVEF 64% with normal wall motion. This is a low risk study.   . Carotid stenosis    Carotid US 3/19: Bilateral ICA 1-39  . DDD (degenerative disc disease)   . Gout   . HLD (hyperlipidemia)   . Hx of adenomatous colonic polyps 07/25/2015  . Hyperglycemia   . Hypertension   . Lumbago    (low back pain)   . Neuropathy   . PAF (paroxysmal atrial fibrillation) ([place])    Anticoag (previously on Xarelto) stopped in 2017 due to concerns over bleeding risk in the setting of heavy alcohol use)  . Paroxysmal atrial flutter ([place])    TEE 6/15: EF 50-55, no RWMA, mild MR, LAE, no LAA clot >> s/p DCCV to restore NSR  . S/P CABG x 1   . Sciatica     Family History  Problem Relation Age of Onset  . Coronary artery disease Other        family history  . Heart disease Father        CABG   . Heart attack Father   . Stroke Other        family history  . Colon cancer Neg Hx   . Colon polyps Neg Hx   . Esophageal cancer Neg Hx   . Rectal cancer Neg Hx   . Stomach cancer Neg Hx     Past Surgical History:  Procedure Laterality Date  . ABDOMINAL AORTOGRAM W/LOWER EXTREMITY Bilateral 01/29/2019   Procedure: ABDOMINAL AORTOGRAM W/LOWER EXTREMITY;  Surgeon: Angelia Mould, MD;  Location: [facility];  Service: Cardiovascular;  Laterality: Bilateral;  . CARDIAC CATHETERIZATION  4.17.09  .  CARDIOVERSION N/A 11/03/2013   Procedure: CARDIOVERSION;  Surgeon: Dorothy Spark, MD;  [facility]: [facility] ENDOSCOPY;  Service: Cardiovascular;  Laterality: N/A;  . COLONOSCOPY  10-01-2002   gessner-tics and hems   . CORONARY ARTERY BYPASS GRAFT  5.8.09   x3  . INGUINAL HERNIA REPAIR Left   . LAPAROSCOPIC INGUINAL HERNIA REPAIR Right   . TEE WITHOUT CARDIOVERSION N/A 11/03/2013   Procedure: TRANSESOPHAGEAL ECHOCARDIOGRAM (TEE);  Surgeon: Dorothy Spark, MD;  Location: [facility];  Service: Cardiovascular;  Laterality: N/A;  . TONSILLECTOMY AND ADENOIDECTOMY    . WRIST SURGERY     Social History   Occupational History  . Not on file  Tobacco Use  . Smoking status: Never Smoker  . Smokeless tobacco: Never Used  Substance and Sexual Activity  . Alcohol use: Yes    Alcohol/week: 0.0 standard drinks    Comment: 6 beers per week  . Drug use: No  . Sexual activity: Not on file

## 2020-10-07 ENCOUNTER — Other Ambulatory Visit (HOSPITAL_COMMUNITY): Payer: Self-pay

## 2020-10-07 MED ORDER — MOLNUPIRAVIR 200 MG PO CAPS
800.0000 mg | ORAL_CAPSULE | Freq: Two times a day (BID) | ORAL | 0 refills | Status: AC
  Filled 2020-10-07: qty 40, 5d supply, fill #0

## 2020-10-09 ENCOUNTER — Telehealth: Payer: Self-pay | Admitting: Orthopedic Surgery

## 2020-10-09 ENCOUNTER — Encounter: Payer: Self-pay | Admitting: Orthopedic Surgery

## 2020-10-09 ENCOUNTER — Other Ambulatory Visit: Payer: Self-pay | Admitting: Physician Assistant

## 2020-10-09 NOTE — Telephone Encounter (Signed)
Messaged pt's wife back in [place] to advise that per Dr. Sharol Given the pt should come in this Friday for evaluation and dressing change. He should continue with ABX and washing legs daily and changing dressing. Will post for surgery first of next month until then will follow pt weekly.

## 2020-10-09 NOTE — Telephone Encounter (Signed)
[place] pts wife called stating he was supposed to have a procedure on 10/11/20 but he tested positive for covid this weekend, so she would like a CB to reschedule this please.   [phone]

## 2020-10-11 ENCOUNTER — Inpatient Hospital Stay: Admit: 2020-10-11 | Payer: Medicare Other | Admitting: Orthopedic Surgery

## 2020-10-11 SURGERY — IRRIGATION AND DEBRIDEMENT EXTREMITY
Anesthesia: Choice | Laterality: Right

## 2020-10-13 ENCOUNTER — Ambulatory Visit (INDEPENDENT_AMBULATORY_CARE_PROVIDER_SITE_OTHER): Payer: Medicare Other | Admitting: Physician Assistant

## 2020-10-13 ENCOUNTER — Encounter: Payer: Self-pay | Admitting: Physician Assistant

## 2020-10-13 VITALS — BP 157/90 | HR 97 | Temp 98.7°F

## 2020-10-13 DIAGNOSIS — I87332 Chronic venous hypertension (idiopathic) with ulcer and inflammation of left lower extremity: Secondary | ICD-10-CM

## 2020-10-13 DIAGNOSIS — L97929 Non-pressure chronic ulcer of unspecified part of left lower leg with unspecified severity: Secondary | ICD-10-CM | POA: Diagnosis not present

## 2020-10-13 MED ORDER — OXYCODONE-ACETAMINOPHEN 5-325 MG PO TABS: 1.0000 | ORAL_TABLET | ORAL | 0 refills | Status: AC | PRN

## 2020-10-13 NOTE — Progress Notes (Signed)
Office Visit Note   Patient: [name]           Date of Birth: November 09, 1950           MRN: [id number] Visit Date: 10/13/2020              Requested by: No referring provider defined for this encounter. PCP: Patient, No Pcp Per (Inactive)  Chief Complaint  Patient presents with  . Left Foot - Pain  . Right Foot - Pain      HPI: Patient is here to follow-up on his bilateral lower extremities.  His wife has been doing daily dressing changes and cleansing with Dial soap and water.  The patient is concerned that he seems to have more swelling on the right leg.  He wants to know if he has a pulse in the foot.  Both he and his wife state that they are upset that Dr. Jess Barters not available because they have extensive questions they want to discuss only with him regarding to surgical intervention.  His surgery was canceled secondary to him testing COVID  Assessment & Plan: Visit Diagnoses: No diagnosis found.  Plan: Continue daily dressing changes.  I will follow-up in another week to check his wounds.  Follow-Up Instructions: No follow-ups on file.   Ortho Exam  Patient is alert, oriented, no adenopathy, well-dressed, normal affect, normal respiratory effort. Right lower extremity he has necrotic changes in the great toe as well as the lateral foot wound with still have darkness and foul odor.  He has some skin maceration.  He has areas of fibrinous tissue.  However appearance of leg of the is no worse than 1 week ago.  He does have some moderate soft tissue swelling but no ascending cellulitis.  He has negative Bevelyn Buckles' sign compartments are not tender to compression. Left lower extremity minimal soft tissue swelling he does have some fibrinous areas but no worse than a week ago.  No evidence of necrotic areas.  On the right a biphasic pulse was dopplered.  Vitals were taken at the request of the patient's wife he had a temperature of 98 7 and a blood pressure of 158/90  Imaging: No  results found. No images are attached to the encounter.  Labs: Lab Results  Component Value Date   HGBA1C (H) 09/19/2010    6.4 (NOTE)                                                                       According to the ADA Clinical Practice Recommendations for 2011, when HbA1c is used as a screening test:   >=6.5%   Diagnostic of Diabetes Mellitus           (if abnormal result  is confirmed)  5.7-6.4%   Increased risk of developing Diabetes Mellitus  References:Diagnosis and Classification of Diabetes Mellitus,Diabetes QQVZ,5638,75(IEPPI 1):S62-S69 and Standards of Medical Care in         Diabetes - 2011,Diabetes Care,2011,34  (Suppl 1):S11-S61.   HGBA1C (H) 10/30/2009    7.0 (NOTE)  According to the ADA Clinical Practice Recommendations for 2011, when HbA1c is used as a screening test:   >=6.5%   Diagnostic of Diabetes Mellitus           (if abnormal result  is confirmed)  5.7-6.4%   Increased risk of developing Diabetes Mellitus  References:Diagnosis and Classification of Diabetes Mellitus,Diabetes SWFU,9323,55(DDUKG 1):S62-S69 and Standards of Medical Care in         Diabetes - 2011,Diabetes URKY,7062,37  (Suppl 1):S11-S61.   HGBA1C  09/29/2007    5.7 (NOTE)   The ADA recommends the following therapeutic goals for glycemic   control related to Hgb A1C measurement:   Goal of Therapy:   < 7.0% Hgb A1C   Action Suggested:  > 8.0% Hgb A1C   Ref:  Diabetes Care, 22, Suppl. 1, 1999   ESRSEDRATE 17 (H) 10/30/2009   CRP 4.3 (H) 10/30/2009   REPTSTATUS 12/19/2018 FINAL 12/17/2018   GRAMSTAIN NO WBC SEEN NO ORGANISMS SEEN  12/17/2018   CULT  12/17/2018    FEW NORMAL SKIN FLORA Performed at [facility], [place] [address] [place], [place] [address]    LABORGA METHICILLIN RESISTANT STAPHYLOCOCCUS AUREUS 10/17/2007     Lab Results  Component Value Date   ALBUMIN 4.4 08/31/2015   ALBUMIN 4.1 01/27/2014   ALBUMIN 4.3  11/01/2013    No results found for: MG No results found for: VD25OH  No results found for: PREALBUMIN CBC EXTENDED Latest Ref Rng & Units 11/23/2019 01/29/2019 08/31/2015  WBC 3.8 - 10.8 Thousand/uL 6.4 - 5.5  RBC 4.20 - 5.80 Million/uL 4.91 - 4.85  HGB 13.2 - 17.1 g/dL 15.6 15.6 15.3  HCT 38.5 - 50.0 % 47.8 46.0 46.1  PLT 140 - 400 Thousand/uL 212 - 180  NEUTROABS 1,500 - 7,800 cells/uL 3,814 - -  LYMPHSABS 850 - 3,900 cells/uL 1,043 - -     There is no height or weight on file to calculate BMI.  Orders:  No orders of the defined types were placed in this encounter.  Meds ordered this encounter  Medications  . oxyCODONE-acetaminophen (PERCOCET/ROXICET) 5-325 MG tablet    Sig: Take 1 tablet by mouth every 4 (four) hours as needed for severe pain.    Dispense:  30 tablet    Refill:  0     Procedures: No procedures performed  Clinical Data: No additional findings.  ROS:  All other systems negative, except as noted in the HPI. Review of Systems  Objective: Vital Signs: BP (!) 157/90   Pulse 97   Temp 98.7 F (37.1 C)   Specialty Comments:  No specialty comments available.  PMFS History: Patient Active Problem List   Diagnosis Date Noted  . Bilateral primary osteoarthritis of knee 09/07/2019  . Hx of adenomatous colonic polyps 07/25/2015  . Chest pain 01/05/2014  . Coronary artery disease 02/28/2011  . Glucose intolerance (pre-diabetes) 02/28/2011  . ALCOHOL ABUSE 11/21/2009  . WOUND, FOOT 02/11/2009  . VENOUS INSUFFICIENCY, CHRONIC, RIGHT LEG 06/07/2008  . HYPERLIPIDEMIA 03/11/2007  . HYPERTENSION 03/11/2007  . LOW BACK PAIN 03/11/2007   Past Medical History:  Diagnosis Date  . Alcohol abuse   . CAD (coronary artery disease)    a. s/p CABG 2009 (Dr. Roxy Manns - LIMA-OM2, L radial - PDA, SVG-Dx) // b. Nuc 8/15: No ischemia, EF 65, normal study // c. Nuc 3/17: No significant reversible ischemia. LVEF 64% with normal wall motion. This is a low risk study.   .  Carotid  stenosis    Carotid US 3/19: Bilateral ICA 1-39  . DDD (degenerative disc disease)   . Gout   . HLD (hyperlipidemia)   . Hx of adenomatous colonic polyps 07/25/2015  . Hyperglycemia   . Hypertension   . Lumbago    (low back pain)   . Neuropathy   . PAF (paroxysmal atrial fibrillation) ([place])    Anticoag (previously on Xarelto) stopped in 2017 due to concerns over bleeding risk in the setting of heavy alcohol use)  . Paroxysmal atrial flutter ([place])    TEE 6/15: EF 50-55, no RWMA, mild MR, LAE, no LAA clot >> s/p DCCV to restore NSR  . S/P CABG x 1   . Sciatica     Family History  Problem Relation Age of Onset  . Coronary artery disease Other        family history  . Heart disease Father        CABG   . Heart attack Father   . Stroke Other        family history  . Colon cancer Neg Hx   . Colon polyps Neg Hx   . Esophageal cancer Neg Hx   . Rectal cancer Neg Hx   . Stomach cancer Neg Hx     Past Surgical History:  Procedure Laterality Date  . ABDOMINAL AORTOGRAM W/LOWER EXTREMITY Bilateral 01/29/2019   Procedure: ABDOMINAL AORTOGRAM W/LOWER EXTREMITY;  Surgeon: Angelia Mould, MD;  Location: [facility];  Service: Cardiovascular;  Laterality: Bilateral;  . CARDIAC CATHETERIZATION  4.17.09  . CARDIOVERSION N/A 11/03/2013   Procedure: CARDIOVERSION;  Surgeon: Dorothy Spark, MD;  Location: [facility] ENDOSCOPY;  Service: Cardiovascular;  Laterality: N/A;  . COLONOSCOPY  10-01-2002   gessner-tics and hems   . CORONARY ARTERY BYPASS GRAFT  5.8.09   x3  . INGUINAL HERNIA REPAIR Left   . LAPAROSCOPIC INGUINAL HERNIA REPAIR Right   . TEE WITHOUT CARDIOVERSION N/A 11/03/2013   Procedure: TRANSESOPHAGEAL ECHOCARDIOGRAM (TEE);  Surgeon: Dorothy Spark, MD;  Location: [facility];  Service: Cardiovascular;  Laterality: N/A;  . TONSILLECTOMY AND ADENOIDECTOMY    . WRIST SURGERY     Social History   Occupational History  . Not on file  Tobacco Use  . Smoking  status: Never Smoker  . Smokeless tobacco: Never Used  Substance and Sexual Activity  . Alcohol use: Yes    Alcohol/week: 0.0 standard drinks    Comment: 6 beers per week  . Drug use: No  . Sexual activity: Not on file

## 2020-10-20 ENCOUNTER — Ambulatory Visit: Payer: Medicare Other | Admitting: Physician Assistant

## 2020-10-20 ENCOUNTER — Other Ambulatory Visit: Payer: Self-pay | Admitting: Physician Assistant

## 2020-10-27 ENCOUNTER — Ambulatory Visit: Payer: Medicare Other | Admitting: Physician Assistant

## 2020-10-27 ENCOUNTER — Telehealth: Payer: Self-pay | Admitting: Orthopedic Surgery

## 2020-10-27 NOTE — Telephone Encounter (Signed)
Mr. Brix was scheduled for right foot debridement vs. Below knee amputation on 10/11/20, surgery was cancelled because he tested positive for COVID.  We discussed putting his surgery back on the schedule for Wednesday 11/01/20, but Mackie Pai called and stated that Mr. Faulcon would not be rescheduling surgery and would not be returning to to see Dr. Sharol Given.  No other information was given.

## 2020-10-31 ENCOUNTER — Ambulatory Visit: Payer: Medicare Other | Admitting: Orthopedic Surgery

## 2020-10-31 NOTE — Telephone Encounter (Signed)
I called no answer looks like pt had I*D at [facility] on 10/27/20. I will hold message and continue to reach out to pt but make Dr. Sharol Given aware of pt being followed at [facility]

## 2020-11-02 ENCOUNTER — Encounter: Payer: Self-pay | Admitting: Internal Medicine

## 2020-11-03 NOTE — Telephone Encounter (Signed)
I tried to reach pt no answer. Will sign off on this message.

## 2022-08-14 ENCOUNTER — Encounter: Payer: Self-pay | Admitting: Internal Medicine
